# Patient Record
Sex: Female | Born: 1945 | Race: Black or African American | Hispanic: No | Marital: Single | Smoking: Former smoker
Health system: Southern US, Community
[De-identification: ages and names within clinical notes are randomized; demographics above are authoritative.]

## PROBLEM LIST (undated history)

## (undated) DIAGNOSIS — K219 Gastro-esophageal reflux disease without esophagitis: Secondary | ICD-10-CM

## (undated) DIAGNOSIS — J189 Pneumonia, unspecified organism: Secondary | ICD-10-CM

## (undated) DIAGNOSIS — I771 Stricture of artery: Secondary | ICD-10-CM

## (undated) DIAGNOSIS — R51 Headache: Secondary | ICD-10-CM

## (undated) DIAGNOSIS — F17200 Nicotine dependence, unspecified, uncomplicated: Secondary | ICD-10-CM

## (undated) DIAGNOSIS — I10 Essential (primary) hypertension: Secondary | ICD-10-CM

## (undated) DIAGNOSIS — R918 Other nonspecific abnormal finding of lung field: Secondary | ICD-10-CM

## (undated) DIAGNOSIS — H269 Unspecified cataract: Secondary | ICD-10-CM

## (undated) DIAGNOSIS — J449 Chronic obstructive pulmonary disease, unspecified: Secondary | ICD-10-CM

## (undated) DIAGNOSIS — E2839 Other primary ovarian failure: Secondary | ICD-10-CM

## (undated) DIAGNOSIS — E785 Hyperlipidemia, unspecified: Secondary | ICD-10-CM

## (undated) DIAGNOSIS — Z9114 Patient's other noncompliance with medication regimen: Secondary | ICD-10-CM

## (undated) DIAGNOSIS — F810 Specific reading disorder: Secondary | ICD-10-CM

## (undated) DIAGNOSIS — M810 Age-related osteoporosis without current pathological fracture: Secondary | ICD-10-CM

## (undated) DIAGNOSIS — E119 Type 2 diabetes mellitus without complications: Secondary | ICD-10-CM

## (undated) DIAGNOSIS — H5015 Alternating exotropia: Secondary | ICD-10-CM

## (undated) DIAGNOSIS — R079 Chest pain, unspecified: Secondary | ICD-10-CM

## (undated) DIAGNOSIS — M1310 Monoarthritis, not elsewhere classified, unspecified site: Secondary | ICD-10-CM

## (undated) DIAGNOSIS — IMO0002 Reserved for concepts with insufficient information to code with codable children: Secondary | ICD-10-CM

## (undated) HISTORY — DX: Stricture of artery: I77.1

## (undated) HISTORY — DX: Other primary ovarian failure: E28.39

## (undated) HISTORY — DX: Headache: R51

## (undated) HISTORY — DX: Specific reading disorder: F81.0

## (undated) HISTORY — DX: Hyperlipidemia, unspecified: E78.5

## (undated) HISTORY — DX: Alternating exotropia: H50.15

## (undated) HISTORY — DX: Other nonspecific abnormal finding of lung field: R91.8

## (undated) HISTORY — DX: Reserved for concepts with insufficient information to code with codable children: IMO0002

## (undated) HISTORY — DX: Unspecified cataract: H26.9

## (undated) HISTORY — DX: Age-related osteoporosis without current pathological fracture: M81.0

## (undated) HISTORY — DX: Chest pain, unspecified: R07.9

## (undated) HISTORY — DX: Patient's other noncompliance with medication regimen: Z91.14

## (undated) HISTORY — DX: Nicotine dependence, unspecified, uncomplicated: F17.200

## (undated) HISTORY — DX: Gastro-esophageal reflux disease without esophagitis: K21.9

---

## 1973-04-12 HISTORY — PX: CHOLECYSTECTOMY: SHX55

## 1973-04-12 HISTORY — PX: TUBAL LIGATION: SHX77

## 1997-09-12 ENCOUNTER — Encounter: Admission: RE | Admit: 1997-09-12 | Discharge: 1997-09-12 | Payer: Self-pay | Admitting: Family Medicine

## 1997-10-01 ENCOUNTER — Encounter: Admission: RE | Admit: 1997-10-01 | Discharge: 1997-10-01 | Payer: Self-pay | Admitting: Family Medicine

## 1998-03-25 ENCOUNTER — Encounter: Admission: RE | Admit: 1998-03-25 | Discharge: 1998-03-25 | Payer: Self-pay | Admitting: Family Medicine

## 1998-10-13 ENCOUNTER — Encounter: Admission: RE | Admit: 1998-10-13 | Discharge: 1998-10-13 | Payer: Self-pay | Admitting: Family Medicine

## 1998-12-23 ENCOUNTER — Encounter: Admission: RE | Admit: 1998-12-23 | Discharge: 1998-12-23 | Payer: Self-pay | Admitting: Family Medicine

## 1999-06-12 ENCOUNTER — Encounter: Admission: RE | Admit: 1999-06-12 | Discharge: 1999-06-12 | Payer: Self-pay | Admitting: Family Medicine

## 2000-01-12 ENCOUNTER — Encounter: Admission: RE | Admit: 2000-01-12 | Discharge: 2000-01-12 | Payer: Self-pay | Admitting: Family Medicine

## 2000-01-25 ENCOUNTER — Encounter: Admission: RE | Admit: 2000-01-25 | Discharge: 2000-04-24 | Payer: Self-pay | Admitting: Family Medicine

## 2000-01-29 ENCOUNTER — Encounter: Admission: RE | Admit: 2000-01-29 | Discharge: 2000-01-29 | Payer: Self-pay | Admitting: Family Medicine

## 2000-01-29 ENCOUNTER — Encounter: Payer: Self-pay | Admitting: Family Medicine

## 2000-02-16 ENCOUNTER — Encounter: Admission: RE | Admit: 2000-02-16 | Discharge: 2000-02-16 | Payer: Self-pay | Admitting: Family Medicine

## 2000-03-12 ENCOUNTER — Encounter (INDEPENDENT_AMBULATORY_CARE_PROVIDER_SITE_OTHER): Payer: Self-pay | Admitting: *Deleted

## 2000-03-12 LAB — CONVERTED CEMR LAB

## 2000-03-22 ENCOUNTER — Encounter: Admission: RE | Admit: 2000-03-22 | Discharge: 2000-03-22 | Payer: Self-pay | Admitting: Family Medicine

## 2000-04-25 ENCOUNTER — Encounter: Admission: RE | Admit: 2000-04-25 | Discharge: 2000-07-24 | Payer: Self-pay | Admitting: Family Medicine

## 2000-08-30 ENCOUNTER — Encounter: Admission: RE | Admit: 2000-08-30 | Discharge: 2000-08-30 | Payer: Self-pay | Admitting: Family Medicine

## 2000-09-01 ENCOUNTER — Encounter: Admission: RE | Admit: 2000-09-01 | Discharge: 2000-11-30 | Payer: Self-pay | Admitting: Family Medicine

## 2000-11-02 ENCOUNTER — Emergency Department (HOSPITAL_COMMUNITY): Admission: EM | Admit: 2000-11-02 | Discharge: 2000-11-02 | Payer: Self-pay | Admitting: Emergency Medicine

## 2000-11-02 ENCOUNTER — Encounter: Payer: Self-pay | Admitting: Emergency Medicine

## 2000-11-22 ENCOUNTER — Encounter: Admission: RE | Admit: 2000-11-22 | Discharge: 2000-11-22 | Payer: Self-pay | Admitting: Family Medicine

## 2000-11-30 ENCOUNTER — Encounter: Admission: RE | Admit: 2000-11-30 | Discharge: 2000-11-30 | Payer: Self-pay | Admitting: Family Medicine

## 2000-12-04 ENCOUNTER — Emergency Department (HOSPITAL_COMMUNITY): Admission: EM | Admit: 2000-12-04 | Discharge: 2000-12-04 | Payer: Self-pay | Admitting: Emergency Medicine

## 2000-12-04 ENCOUNTER — Encounter: Payer: Self-pay | Admitting: Emergency Medicine

## 2000-12-08 ENCOUNTER — Encounter: Admission: RE | Admit: 2000-12-08 | Discharge: 2001-03-08 | Payer: Self-pay | Admitting: Family Medicine

## 2000-12-27 ENCOUNTER — Encounter: Admission: RE | Admit: 2000-12-27 | Discharge: 2000-12-27 | Payer: Self-pay | Admitting: Family Medicine

## 2001-04-18 ENCOUNTER — Encounter: Admission: RE | Admit: 2001-04-18 | Discharge: 2001-04-18 | Payer: Self-pay | Admitting: Family Medicine

## 2001-04-26 ENCOUNTER — Encounter: Payer: Self-pay | Admitting: Family Medicine

## 2001-04-26 ENCOUNTER — Encounter: Admission: RE | Admit: 2001-04-26 | Discharge: 2001-04-26 | Payer: Self-pay | Admitting: Family Medicine

## 2001-06-07 ENCOUNTER — Encounter: Admission: RE | Admit: 2001-06-07 | Discharge: 2001-09-05 | Payer: Self-pay | Admitting: Family Medicine

## 2001-09-19 ENCOUNTER — Encounter: Admission: RE | Admit: 2001-09-19 | Discharge: 2001-09-19 | Payer: Self-pay | Admitting: Family Medicine

## 2001-12-05 ENCOUNTER — Encounter: Admission: RE | Admit: 2001-12-05 | Discharge: 2002-03-05 | Payer: Self-pay | Admitting: Family Medicine

## 2002-03-07 ENCOUNTER — Encounter: Admission: RE | Admit: 2002-03-07 | Discharge: 2002-06-05 | Payer: Self-pay | Admitting: Family Medicine

## 2002-05-15 ENCOUNTER — Encounter: Admission: RE | Admit: 2002-05-15 | Discharge: 2002-05-15 | Payer: Self-pay | Admitting: Family Medicine

## 2003-02-19 ENCOUNTER — Encounter: Admission: RE | Admit: 2003-02-19 | Discharge: 2003-02-19 | Payer: Self-pay | Admitting: Family Medicine

## 2003-05-23 ENCOUNTER — Encounter: Admission: RE | Admit: 2003-05-23 | Discharge: 2003-05-23 | Payer: Self-pay | Admitting: Sports Medicine

## 2003-05-29 ENCOUNTER — Encounter: Admission: RE | Admit: 2003-05-29 | Discharge: 2003-05-29 | Payer: Self-pay | Admitting: Family Medicine

## 2003-05-30 ENCOUNTER — Encounter: Admission: RE | Admit: 2003-05-30 | Discharge: 2003-05-30 | Payer: Self-pay | Admitting: Family Medicine

## 2004-01-21 ENCOUNTER — Ambulatory Visit: Payer: Self-pay | Admitting: Family Medicine

## 2004-02-04 ENCOUNTER — Ambulatory Visit: Payer: Self-pay | Admitting: Family Medicine

## 2004-02-04 DIAGNOSIS — I771 Stricture of artery: Secondary | ICD-10-CM

## 2004-02-04 HISTORY — DX: Stricture of artery: I77.1

## 2004-02-11 ENCOUNTER — Ambulatory Visit: Payer: Self-pay | Admitting: Family Medicine

## 2004-02-17 ENCOUNTER — Ambulatory Visit: Payer: Self-pay | Admitting: Sports Medicine

## 2004-02-25 ENCOUNTER — Ambulatory Visit: Payer: Self-pay | Admitting: Family Medicine

## 2004-03-17 ENCOUNTER — Ambulatory Visit: Payer: Self-pay | Admitting: Sports Medicine

## 2004-03-31 ENCOUNTER — Ambulatory Visit: Payer: Self-pay | Admitting: Family Medicine

## 2004-04-29 ENCOUNTER — Ambulatory Visit (HOSPITAL_COMMUNITY): Admission: RE | Admit: 2004-04-29 | Discharge: 2004-04-29 | Payer: Self-pay | Admitting: Gastroenterology

## 2004-05-19 ENCOUNTER — Ambulatory Visit: Payer: Self-pay | Admitting: Family Medicine

## 2004-06-01 ENCOUNTER — Encounter: Admission: RE | Admit: 2004-06-01 | Discharge: 2004-06-01 | Payer: Self-pay | Admitting: Family Medicine

## 2004-10-06 ENCOUNTER — Ambulatory Visit: Payer: Self-pay | Admitting: Family Medicine

## 2005-05-04 ENCOUNTER — Ambulatory Visit: Payer: Self-pay | Admitting: Family Medicine

## 2005-10-03 ENCOUNTER — Emergency Department (HOSPITAL_COMMUNITY): Admission: EM | Admit: 2005-10-03 | Discharge: 2005-10-03 | Payer: Self-pay | Admitting: Emergency Medicine

## 2005-10-10 DIAGNOSIS — IMO0002 Reserved for concepts with insufficient information to code with codable children: Secondary | ICD-10-CM

## 2005-10-10 HISTORY — DX: Reserved for concepts with insufficient information to code with codable children: IMO0002

## 2005-10-19 ENCOUNTER — Ambulatory Visit: Payer: Self-pay | Admitting: Sports Medicine

## 2005-11-09 ENCOUNTER — Ambulatory Visit: Payer: Self-pay | Admitting: Family Medicine

## 2006-03-28 ENCOUNTER — Ambulatory Visit: Payer: Self-pay | Admitting: Family Medicine

## 2006-04-19 ENCOUNTER — Ambulatory Visit: Payer: Self-pay | Admitting: Family Medicine

## 2006-05-10 ENCOUNTER — Encounter: Admission: RE | Admit: 2006-05-10 | Discharge: 2006-05-10 | Payer: Self-pay | Admitting: Family Medicine

## 2006-05-24 ENCOUNTER — Encounter: Admission: RE | Admit: 2006-05-24 | Discharge: 2006-05-24 | Payer: Self-pay | Admitting: Family Medicine

## 2006-06-06 LAB — CONVERTED CEMR LAB
Albumin: 4.4 g/dL (ref 3.5–5.2)
Alkaline Phosphatase: 78 units/L (ref 39–117)
BUN: 15 mg/dL (ref 6–23)
CO2: 30 meq/L (ref 19–32)
Glucose, Bld: 100 mg/dL — ABNORMAL HIGH (ref 70–99)
Total Bilirubin: 0.5 mg/dL (ref 0.3–1.2)
Total Protein: 7.6 g/dL (ref 6.0–8.3)

## 2006-06-09 DIAGNOSIS — M5430 Sciatica, unspecified side: Secondary | ICD-10-CM

## 2006-06-09 DIAGNOSIS — K219 Gastro-esophageal reflux disease without esophagitis: Secondary | ICD-10-CM | POA: Insufficient documentation

## 2006-06-09 DIAGNOSIS — I10 Essential (primary) hypertension: Secondary | ICD-10-CM

## 2006-06-09 DIAGNOSIS — E119 Type 2 diabetes mellitus without complications: Secondary | ICD-10-CM

## 2006-06-09 DIAGNOSIS — H269 Unspecified cataract: Secondary | ICD-10-CM

## 2006-06-09 DIAGNOSIS — E785 Hyperlipidemia, unspecified: Secondary | ICD-10-CM | POA: Insufficient documentation

## 2006-06-10 ENCOUNTER — Encounter (INDEPENDENT_AMBULATORY_CARE_PROVIDER_SITE_OTHER): Payer: Self-pay | Admitting: *Deleted

## 2007-02-21 ENCOUNTER — Ambulatory Visit: Payer: Self-pay | Admitting: Family Medicine

## 2007-02-21 ENCOUNTER — Encounter: Payer: Self-pay | Admitting: Family Medicine

## 2007-02-21 LAB — CONVERTED CEMR LAB
BUN: 12 mg/dL (ref 6–23)
Chloride: 105 meq/L (ref 96–112)
Cholesterol, target level: 200 mg/dL
Creatinine, Ser: 0.74 mg/dL (ref 0.40–1.20)
Glucose, Bld: 108 mg/dL — ABNORMAL HIGH (ref 70–99)
HDL goal, serum: 40 mg/dL
Hgb A1c MFr Bld: 6 %
Pap Smear: NORMAL
Potassium: 3.8 meq/L (ref 3.5–5.3)

## 2007-02-28 ENCOUNTER — Encounter: Payer: Self-pay | Admitting: Family Medicine

## 2007-03-21 ENCOUNTER — Encounter: Payer: Self-pay | Admitting: Family Medicine

## 2007-05-17 ENCOUNTER — Encounter: Admission: RE | Admit: 2007-05-17 | Discharge: 2007-05-17 | Payer: Self-pay | Admitting: Family Medicine

## 2007-05-19 ENCOUNTER — Encounter: Payer: Self-pay | Admitting: Family Medicine

## 2007-05-31 ENCOUNTER — Encounter: Payer: Self-pay | Admitting: Family Medicine

## 2007-06-26 ENCOUNTER — Encounter: Payer: Self-pay | Admitting: Family Medicine

## 2007-06-27 ENCOUNTER — Encounter: Payer: Self-pay | Admitting: Family Medicine

## 2007-06-28 ENCOUNTER — Encounter: Payer: Self-pay | Admitting: Family Medicine

## 2007-07-11 ENCOUNTER — Ambulatory Visit: Payer: Self-pay | Admitting: Family Medicine

## 2007-07-11 ENCOUNTER — Encounter: Payer: Self-pay | Admitting: Family Medicine

## 2007-07-11 LAB — CONVERTED CEMR LAB
AST: 17 units/L (ref 0–37)
Albumin: 4.6 g/dL (ref 3.5–5.2)
Alkaline Phosphatase: 65 units/L (ref 39–117)
BUN: 14 mg/dL (ref 6–23)
Creatinine, Ser: 0.82 mg/dL (ref 0.40–1.20)
Glucose, Bld: 95 mg/dL (ref 70–99)
HDL: 58 mg/dL (ref >39)
Hemoglobin: 12.9 g/dL (ref 12.0–15.0)
Hgb A1c MFr Bld: 5.5 %
LDL Cholesterol: 105 mg/dL — ABNORMAL HIGH (ref 0–99)
MCHC: 33.2 g/dL (ref 30.0–36.0)
MCV: 88.2 fL (ref 78.0–100.0)
RBC: 4.4 M/uL (ref 3.87–5.11)
RDW: 13.7 % (ref 11.5–15.5)
Total Bilirubin: 1 mg/dL (ref 0.3–1.2)
Total CHOL/HDL Ratio: 3.1
Triglycerides: 74 mg/dL (ref <150)
VLDL: 15 mg/dL (ref 0–40)

## 2007-07-18 ENCOUNTER — Ambulatory Visit: Payer: Self-pay | Admitting: Family Medicine

## 2007-07-18 DIAGNOSIS — F810 Specific reading disorder: Secondary | ICD-10-CM | POA: Insufficient documentation

## 2007-07-18 DIAGNOSIS — H5015 Alternating exotropia: Secondary | ICD-10-CM

## 2007-07-18 HISTORY — DX: Specific reading disorder: F81.0

## 2007-07-18 LAB — CONVERTED CEMR LAB

## 2007-08-15 ENCOUNTER — Encounter: Payer: Self-pay | Admitting: Family Medicine

## 2007-08-15 DIAGNOSIS — F4322 Adjustment disorder with anxiety: Secondary | ICD-10-CM

## 2007-08-16 ENCOUNTER — Encounter: Payer: Self-pay | Admitting: Family Medicine

## 2007-09-07 ENCOUNTER — Encounter: Payer: Self-pay | Admitting: Family Medicine

## 2007-09-11 ENCOUNTER — Telehealth: Payer: Self-pay | Admitting: *Deleted

## 2007-09-11 ENCOUNTER — Telehealth: Payer: Self-pay | Admitting: Family Medicine

## 2007-09-28 ENCOUNTER — Ambulatory Visit: Payer: Self-pay | Admitting: Family Medicine

## 2007-10-16 ENCOUNTER — Ambulatory Visit: Payer: Self-pay | Admitting: Family Medicine

## 2007-10-16 ENCOUNTER — Telehealth: Payer: Self-pay | Admitting: *Deleted

## 2007-12-19 ENCOUNTER — Encounter: Payer: Self-pay | Admitting: Family Medicine

## 2007-12-22 ENCOUNTER — Telehealth: Payer: Self-pay | Admitting: Family Medicine

## 2008-02-08 ENCOUNTER — Encounter: Payer: Self-pay | Admitting: Family Medicine

## 2008-02-13 ENCOUNTER — Ambulatory Visit: Payer: Self-pay | Admitting: Family Medicine

## 2008-02-13 DIAGNOSIS — N39498 Other specified urinary incontinence: Secondary | ICD-10-CM

## 2008-02-13 LAB — CONVERTED CEMR LAB
BUN: 12 mg/dL (ref 6–23)
Bilirubin Urine: NEGATIVE
CO2: 29 meq/L (ref 19–32)
Calcium: 9.9 mg/dL (ref 8.4–10.5)
Chloride: 97 meq/L (ref 96–112)
Creatinine, Ser: 0.96 mg/dL (ref 0.40–1.20)
Direct LDL: 136 mg/dL — ABNORMAL HIGH
Glucose, Bld: 85 mg/dL (ref 70–99)
Ketones, urine, test strip: NEGATIVE
Potassium: 3.8 meq/L (ref 3.5–5.3)
Sodium: 138 meq/L (ref 135–145)
Urobilinogen, UA: 1

## 2008-02-14 ENCOUNTER — Encounter: Payer: Self-pay | Admitting: Family Medicine

## 2008-06-10 ENCOUNTER — Encounter: Payer: Self-pay | Admitting: Family Medicine

## 2008-08-08 ENCOUNTER — Telehealth: Payer: Self-pay | Admitting: Family Medicine

## 2008-08-08 ENCOUNTER — Encounter: Payer: Self-pay | Admitting: Family Medicine

## 2008-08-08 ENCOUNTER — Ambulatory Visit: Payer: Self-pay | Admitting: Family Medicine

## 2008-08-08 LAB — CONVERTED CEMR LAB
ALT: 12 units/L (ref 0–35)
AST: 19 units/L (ref 0–37)
Creatinine, Ser: 0.77 mg/dL (ref 0.40–1.20)
HCT: 41.2 % (ref 36.0–46.0)
Hgb A1c MFr Bld: 5.7 %
MCV: 86.6 fL (ref 78.0–100.0)
Platelets: 425 10*3/uL — ABNORMAL HIGH (ref 150–400)
RDW: 12.9 % (ref 11.5–15.5)
Total Bilirubin: 0.8 mg/dL (ref 0.3–1.2)

## 2008-08-11 ENCOUNTER — Encounter: Payer: Self-pay | Admitting: Family Medicine

## 2008-08-21 ENCOUNTER — Telehealth: Payer: Self-pay | Admitting: *Deleted

## 2008-12-18 ENCOUNTER — Ambulatory Visit: Payer: Self-pay | Admitting: Family Medicine

## 2009-01-28 ENCOUNTER — Ambulatory Visit: Payer: Self-pay | Admitting: Family Medicine

## 2009-01-28 LAB — CONVERTED CEMR LAB
BUN: 15 mg/dL (ref 6–23)
CO2: 25 meq/L (ref 19–32)
Calcium: 10.1 mg/dL (ref 8.4–10.5)
Chloride: 98 meq/L (ref 96–112)
Creatinine, Ser: 0.87 mg/dL (ref 0.40–1.20)
Direct LDL: 131 mg/dL — ABNORMAL HIGH
Glucose, Bld: 107 mg/dL — ABNORMAL HIGH (ref 70–99)
Uric Acid, Serum: 6.3 mg/dL (ref 2.4–7.0)
Vit D, 25-Hydroxy: 12 ng/mL — ABNORMAL LOW (ref 30–89)

## 2009-01-31 ENCOUNTER — Telehealth: Payer: Self-pay | Admitting: Family Medicine

## 2009-02-19 ENCOUNTER — Encounter: Payer: Self-pay | Admitting: Family Medicine

## 2009-02-25 ENCOUNTER — Encounter: Admission: RE | Admit: 2009-02-25 | Discharge: 2009-02-25 | Payer: Self-pay | Admitting: Family Medicine

## 2009-03-04 ENCOUNTER — Encounter (INDEPENDENT_AMBULATORY_CARE_PROVIDER_SITE_OTHER): Payer: Self-pay | Admitting: *Deleted

## 2009-03-04 DIAGNOSIS — F17200 Nicotine dependence, unspecified, uncomplicated: Secondary | ICD-10-CM

## 2009-03-04 HISTORY — DX: Nicotine dependence, unspecified, uncomplicated: F17.200

## 2009-03-10 ENCOUNTER — Encounter: Payer: Self-pay | Admitting: Family Medicine

## 2009-03-21 ENCOUNTER — Encounter: Payer: Self-pay | Admitting: Family Medicine

## 2009-04-16 ENCOUNTER — Ambulatory Visit: Payer: Self-pay | Admitting: Family Medicine

## 2009-04-16 ENCOUNTER — Telehealth: Payer: Self-pay | Admitting: Family Medicine

## 2009-04-16 ENCOUNTER — Ambulatory Visit (HOSPITAL_COMMUNITY): Admission: RE | Admit: 2009-04-16 | Discharge: 2009-04-16 | Payer: Self-pay | Admitting: Family Medicine

## 2009-06-02 ENCOUNTER — Telehealth: Payer: Self-pay | Admitting: Family Medicine

## 2009-06-03 ENCOUNTER — Ambulatory Visit: Payer: Self-pay | Admitting: Family Medicine

## 2009-06-03 ENCOUNTER — Encounter: Admission: RE | Admit: 2009-06-03 | Discharge: 2009-06-03 | Payer: Self-pay | Admitting: Family Medicine

## 2009-06-03 LAB — CONVERTED CEMR LAB: Hgb A1c MFr Bld: 5.9 %

## 2009-06-24 ENCOUNTER — Encounter: Payer: Self-pay | Admitting: Family Medicine

## 2009-08-28 ENCOUNTER — Telehealth: Payer: Self-pay | Admitting: Family Medicine

## 2009-12-01 ENCOUNTER — Encounter: Payer: Self-pay | Admitting: Family Medicine

## 2010-01-13 ENCOUNTER — Ambulatory Visit: Payer: Self-pay | Admitting: Family Medicine

## 2010-01-13 DIAGNOSIS — M2010 Hallux valgus (acquired), unspecified foot: Secondary | ICD-10-CM | POA: Insufficient documentation

## 2010-01-13 DIAGNOSIS — R252 Cramp and spasm: Secondary | ICD-10-CM | POA: Insufficient documentation

## 2010-01-14 ENCOUNTER — Telehealth: Payer: Self-pay | Admitting: Family Medicine

## 2010-01-29 ENCOUNTER — Ambulatory Visit: Payer: Self-pay | Admitting: Family Medicine

## 2010-02-09 ENCOUNTER — Encounter: Payer: Self-pay | Admitting: Family Medicine

## 2010-02-09 LAB — CONVERTED CEMR LAB
AST: 18 units/L (ref 0–37)
BUN: 12 mg/dL (ref 6–23)
Calcium: 9.8 mg/dL (ref 8.4–10.5)
Chloride: 97 meq/L (ref 96–112)
Creatinine, Ser: 0.82 mg/dL (ref 0.40–1.20)
HCT: 42.2 % (ref 36.0–46.0)
HDL: 66 mg/dL (ref >39)
Hemoglobin: 14.2 g/dL (ref 12.0–15.0)
LDL Cholesterol: 125 mg/dL — ABNORMAL HIGH (ref 0–99)
RDW: 13 % (ref 11.5–15.5)
Total CHOL/HDL Ratio: 3.3

## 2010-02-10 ENCOUNTER — Encounter: Payer: Self-pay | Admitting: Pharmacist

## 2010-03-16 ENCOUNTER — Telehealth: Payer: Self-pay | Admitting: Family Medicine

## 2010-05-03 ENCOUNTER — Encounter: Payer: Self-pay | Admitting: Family Medicine

## 2010-05-05 ENCOUNTER — Other Ambulatory Visit: Payer: Self-pay | Admitting: Family Medicine

## 2010-05-05 DIAGNOSIS — Z1231 Encounter for screening mammogram for malignant neoplasm of breast: Secondary | ICD-10-CM

## 2010-05-05 DIAGNOSIS — Z1239 Encounter for other screening for malignant neoplasm of breast: Secondary | ICD-10-CM

## 2010-05-14 NOTE — Progress Notes (Signed)
Summary: Rx Req  Phone Note Call from Patient Call back at [phone]   Caller: Patient Summary of Call: Would like a rx for diabetic shoes.   Initial call taken by: Clydell Hakim,  Aug 28, 2009 1:35 PM  Follow-up for Phone Call        She is due for a follow-up diabetes visit. I can see if her feet qualify at that time. Follow-up by: Zachery Dauer MD,  Aug 28, 2009 6:00 PM     Appended Document: Rx Req Pt notified of doctor's comments pt made appt to be seen.

## 2010-05-14 NOTE — Miscellaneous (Signed)
Summary: Reject refill metformin, last DM visit 01/2009  fax request returned "Have patient contact my office" was checked off

## 2010-05-14 NOTE — Miscellaneous (Signed)
Summary: Orders Update  Clinical Lists Changes  Problems: Added new problem of ENCOUNTER FOR LONG-TERM USE OF OTHER MEDICATIONS (ICD-V58.69) Orders: Added new Test order of B12-[facility] (82607-23330) - Signed Added new Test order of CBC-[facility] (85027) - Signed OK per Dr. Hale 

## 2010-05-14 NOTE — Assessment & Plan Note (Signed)
Summary: f/up,tcb   Vital Signs:  Patient profile:   65 year old female Height:      60 inches Weight:      177.1 pounds BMI:     34.71 Pulse rate:   80 / minute BP sitting:   127 / 77  (right arm)  Vitals Entered By: Arlyss Repress CMA, (January 13, 2010 3:53 PM) CC: f/up DM. flu shot given. fill out form for diabetic shoes. Is Patient Diabetic? Yes Pain Assessment Patient in pain? no        Primary Care Evertte Sones:  Zachery Dauer MD  CC:  f/up DM. flu shot given. fill out form for diabetic shoes..  History of Present Illness: Her only reason for not coming in for follow-up is that she doesn't like going to the doctor. She has [employer] Medicare Complete  through disability and has low copays for medications, but still struggles with this. Doesn't have test-strips for her One-touch Ultra machine. Hasn't had symptoms of hypoglycemia.   Gets cramps in her legs and takes an over the counter cinchona medication for quinine effect. ? if helps.  Takes Prilosec or over the counter H2 blocker to control gerd.    Walking 30 minutes inside most days.     Habits & Providers  Alcohol-Tobacco-Diet     Tobacco Status: current     Tobacco Counseling: to quit use of tobacco products  Comments: pt states 'i only smoke sometimes'  Current Medications (verified): 1)  Aspirin Ec 81 Mg Tbec (Aspirin) .[name].. 1 Tablet By Mouth Once A Day 2)  Metformin Hcl 1000 Mg  Tabs (Metformin Hcl) .... Ake 1/2 Tab in A.m. and 1 Tab in P.m. 3)  Ranitidine Hcl 150 Mg Caps (Ranitidine Hcl) .... Take 1 Capsule By Mouth Twice A Day 4)  Hydrochlorothiazide 25 Mg  Tabs (Hydrochlorothiazide) .... Take One Tablet Daily 5)  Enalapril Maleate 10 Mg  Tabs (Enalapril Maleate) .... Take 1 Tab By Mouth Daily 6)  Proair Hfa 108 (90 Base) Mcg/act Aers (Albuterol Sulfate) .... Take 2 Puffs Every 4 Hour Pain 7)  Onetouch Test  Strp (Glucose Blood) .... Test Blood Fasting Daily  Allergies (verified): No Known Drug  Allergies  Physical Exam  General:  In no acute distress; alert,appropriate and cooperative throughout examination Lungs:  Normal respiratory effort, chest expands symmetrically. Lungs are clear to auscultation except dimished in right, no crackles or wheezes. Heart:  Normal rate and regular rhythm. S1 and S2 normal without gallop, murmur, click, rub or other extra sounds. Extremities:  No edema  Diabetes Management Exam:    Foot Exam (with socks and/or shoes not present):       Sensory-Pinprick/Light touch:          Left medial foot (L-4): normal          Left dorsal foot (L-5): normal          Left lateral foot (S-1): normal          Right medial foot (L-4): normal          Right dorsal foot (L-5): normal          Right lateral foot (S-1): normal       Sensory-Monofilament:          Left foot: normal          Right foot: normal       Inspection:          Left foot: abnormal  Comments: sl callous under bunion          Right foot: abnormal             Comments: callous under bunion       Nails:          Left foot: normal          Right foot: normal   Foot/Ankle Exam  Inspection:    bilateral hallux valgus  Vascular:    dorsalis pedis and posterior tibial pulses 2+ and symmetric, capillary refill < 2 seconds, normal hair pattern, no evidence of ischemia.    Impression & Recommendations:  Problem # 1:  DIABETES MELLITUS II, UNCOMPLICATED (ICD-250.00) Assessment Improved She agrees to come in no less often than every 6 months.  Her updated medication list for this problem includes:    Aspirin Ec 81 Mg Tbec (Aspirin) .[name]... 1 tablet by mouth once a day    Metformin Hcl 1000 Mg Tabs (Metformin hcl) .[name]... Ake 1/2 tab in a.m. and 1 tab in p.m.    Enalapril Maleate 10 Mg Tabs (Enalapril maleate) .[name]... Take 1 tab by mouth daily  Orders: A1C-[facility] (40981) [facility]- Est  Level 4 (99214)Future Orders: Comp Met-[facility] ([id number]) ... 01/11/2011 Lipid-[facility] ([id number]) ...  01/11/2011  Problem # 2:  HYPERTENSION, BENIGN SYSTEMIC (ICD-401.1) Assessment: Improved  Her updated medication list for this problem includes:    Hydrochlorothiazide 25 Mg Tabs (Hydrochlorothiazide) .[name]... Take one tablet daily    Enalapril Maleate 10 Mg Tabs (Enalapril maleate) .[name]... Take 1 tab by mouth daily  Orders: [facility]- Est  Level 4 (99214)Future Orders: Comp Met-[facility] ([id number]) ... 01/11/2011  Problem # 3:  HALLUX VALGUS (ICD-735.0) Form for diabetic shoes completed Orders: [facility]- Est  Level 4 (32440)  Problem # 4:  OBESITY, NOS (ICD-278.00) Assessment: Improved congratulated on weight loss Orders: [facility]- Est  Level 4 (10272)  Problem # 5:  LEG CRAMPS (ICD-729.82)  Future Orders: Comp Met-[facility] ([id number]) ... 01/11/2011 CBC-[facility] (42595) ... 01/11/2011  Complete Medication List: 1)  Aspirin Ec 81 Mg Tbec (Aspirin) .[name].. 1 tablet by mouth once a day 2)  Metformin Hcl 1000 Mg Tabs (Metformin hcl) .... Ake 1/2 tab in a.m. and 1 tab in p.m. 3)  Ranitidine Hcl 150 Mg Caps (Ranitidine hcl) .... Take 1 capsule by mouth twice a day 4)  Hydrochlorothiazide 25 Mg Tabs (Hydrochlorothiazide) .... Take one tablet daily 5)  Enalapril Maleate 10 Mg Tabs (Enalapril maleate) .... Take 1 tab by mouth daily 6)  Proair Hfa 108 (90 Base) Mcg/act Aers (Albuterol sulfate) .... Take 2 puffs every 4 hour pain 7)  Onetouch Test Strp (Glucose blood) .... Test blood fasting daily  Other Orders: Influenza Vaccine NON MCR (63875)  Patient Instructions: 1)  Please schedule a follow-up appointment in 6 months .  2)  Return in 2 weeks for fasting blood tests  3)  Congratulations on your weight loss and exercise program.  Prescriptions: METFORMIN HCL 1000 MG  TABS (METFORMIN HCL) ake 1/2 tab in A.M. and 1 tab in P.M.  #45 x 5   Entered and Authorized by:   Zachery Dauer MD   Signed by:   Zachery Dauer MD on 01/13/2010   Method used:   Printed then faxed to ...       [facility] (retail)       [address]       [address]       [place], [place]  [address]  Ph: [phone]       Fax: [phone]   RxID:   [id number]    Influenza Vaccine    Vaccine Type: Fluvax Non-MCR    Site: left deltoid    Mfr: GlaxoSmithKline    Dose: 0.5 ml    Route: IM    Given by: Arlyss Repress CMA,    Exp. Date: 10/07/2010    Lot #: [id number]    VIS given: 11/04/09 version given January 13, 2010.  Flu Vaccine Consent Questions    Do you have a history of severe allergic reactions to this vaccine? no    Any prior history of allergic reactions to egg and/or gelatin? no    Do you have a sensitivity to the preservative Thimersol? no    Do you have a past history of Guillan-Barre Syndrome? no    Do you currently have an acute febrile illness? no    Have you ever had a severe reaction to latex? no    Vaccine information given and explained to patient? yes    Are you currently pregnant? no  Laboratory Results   Blood Tests   Date/Time Received: January 13, 2010 4:10 PM  Date/Time Reported: January 13, 2010 4:23 PM   HGBA1C: 5.8%   (Normal Range: Non-Diabetic - 3-6%   Control Diabetic - 6-8%)  Comments: ...............test performed by......Marland KitchenBonnie A. [place], MLS (ASCP)cm        Prevention & Chronic Care Immunizations   Influenza vaccine: Fluvax Non-MCR  (01/13/2010)   Influenza vaccine due: 02/21/2008    Tetanus booster: 04/12/2005: Done.   Tetanus booster due: 04/13/2015    Pneumococcal vaccine: Pneumovax  (07/18/2007)   Pneumococcal vaccine due: None    H. zoster vaccine: Not documented  Colorectal Screening   Hemoccult: Done.  (02/11/2004)   Hemoccult due: Not Indicated    Colonoscopy: Done.  (04/12/2004)   Colonoscopy due: 04/12/2014  Other Screening   Pap smear: normal  (02/21/2007)   Pap smear due: 02/21/2008    Mammogram: normal  (05/22/2007)   Mammogram due: 05/21/2008    DXA bone density scan: Not documented   Smoking status: current   (01/13/2010)   Smoking cessation counseling: YES  (06/03/2009)  Diabetes Mellitus   HgbA1C: 5.8  (01/13/2010)   Hemoglobin A1C due: 08/12/2008    Eye exam: No diabetic retinopathy.     (03/18/2009)   Eye exam due: 06/25/2008    Foot exam: yes  (01/13/2010)   High risk foot: Not documented   Foot care education: completed  (02/13/2008)   Foot exam due: 01/28/2010    Urine microalbumin/creatinine ratio: Not documented   Urine microalbumin/cr due: 07/17/2008    Diabetes flowsheet reviewed?: Yes   Progress toward A1C goal: At goal  Lipids   Total Cholesterol: 178  (07/11/2007)   LDL: 105  (07/11/2007)   LDL Direct: 131  (01/28/2009)   HDL: 58  (07/11/2007)   Triglycerides: 74  (07/11/2007)    SGOT (AST): 19  (01/28/2009)   SGPT (ALT): 11  (01/28/2009) CMP ordered    Alkaline phosphatase: 73  (01/28/2009)   Total bilirubin: 0.7  (01/28/2009)    Lipid flowsheet reviewed?: Yes   Progress toward LDL goal: Unchanged  Hypertension   Last Blood Pressure: 127 / 77  (01/13/2010)   Serum creatinine: 0.87  (01/28/2009)   Serum potassium 3.5  (01/28/2009) CMP ordered    Progress toward BP goal: At goal  Self-Management Support :   Personal Goals (by the next  clinic visit) :     Personal A1C goal: 7  (01/28/2009)     Personal blood pressure goal: 140/90  (01/28/2009)     Personal LDL goal: 100  (01/28/2009)    Patient will work on the following items until the next clinic visit to reach self-care goals:     Medications and monitoring: take my medicines every day  (01/13/2010)     Eating: drink diet soda or water instead of juice or soda, eat more vegetables  (01/13/2010)     Activity: take a 30 minute walk every day  (01/13/2010)    Diabetes self-management support: Not documented   Last diabetes self-management training by diabetes educator: completed    Hypertension self-management support: Not documented    Lipid self-management support: Not documented

## 2010-05-14 NOTE — Progress Notes (Signed)
Summary: triage  Phone Note Call from Patient Call back at Home Phone [phone]   Caller: Patient Summary of Call: Pt has had a cold since Friday and every time she coughs it hurts. Initial call taken by: Clydell Hakim,  April 16, 2009 8:39 AM  Follow-up for Phone Call        wants to be checked. work in at [employer]. aware of wait & that her pcp will not be seeing her Follow-up by: Golden Circle RN,  April 16, 2009 8:53 AM

## 2010-05-14 NOTE — Assessment & Plan Note (Signed)
 Summary: cough & back pain/red team   Vital Signs:  Patient profile:   65 year old female Height:      60 inches Weight:      176.6 pounds BMI:     34.61 Temp:     98.3 degrees F oral Pulse rate:   76 / minute BP sitting:   125 / 76  (right arm) Cuff size:   large  Vitals Entered By: Katie Mulberry LPN (August 08, 2008 11:10 AM)  CC: cough with back pain x 2 days Pain Assessment Patient in pain? yes     Location: back Intensity: 8   History of Present Illness:  5F with DM2, HTN, HLD, stress incontinence presents with COUGH: Onset: 2 days ago Description: Wet cough productive of sml amts clear sputum, associated with pain in ribs. Modifying factors:    Symptoms Productive: clear sputum Wheezing: no Dyspnea: no Nasal discharge: no, but associated with nasal stuffiness  Fever: no, subjective chills at home Sore throat: no Sick contacts: no Heartburn symptoms: no, already taking an H2 blocker   History of Asthma: no  Red Flags  Weight loss: no Hemoptysis: never Edema: no    Habits & Providers     Tobacco Status: current     Cigarette Packs/Day: <0.25  Allergies: No Known Drug Allergies  Social History:    Smoking Status:  current    Packs/Day:  <0.25  Review of Systems       See HPI  Physical Exam  General:  Well-developed,well-nourished,in no acute distress; alert,appropriate and cooperative throughout examination Head:  Normocephalic and atraumatic without obvious abnormalities.  Eyes:  No corneal or conjunctival inflammation noted. EOMI. Perrla. Ears:  External ear exam shows no significant lesions or deformities.  Otoscopic examination reveals clear canals, tympanic membranes are intact bilaterally without bulging, retraction, inflammation or discharge. Hearing is grossly normal bilaterally. Nose:  External nasal examination shows no deformity or inflammation. Nasal mucosa are pink and moistand somewhat boggy. Mouth:  Oral mucosa and oropharynx  without lesions or exudates. Neck:  No deformities, masses, or tenderness noted. Lungs:  Normal respiratory effort, chest expands symmetrically. Lungs are clear to auscultation, no crackles or wheezes. Heart:  Normal rate and regular rhythm. S1 and S2 normal without gallop, murmur, click, rub or other extra sounds. Abdomen:  Bowel sounds positive,abdomen soft and non-tender without masses, organomegaly or hernias noted.  Diabetes Management Exam:    Foot Exam (with socks and/or shoes not present):       Sensory-Pinprick/Light touch:          Left medial foot (L-4): normal          Left dorsal foot (L-5): normal          Left lateral foot (S-1): normal          Right medial foot (L-4): normal          Right dorsal foot (L-5): normal          Right lateral foot (S-1): normal       Sensory-Monofilament:          Left foot: normal          Right foot: normal       Inspection:          Left foot: normal          Right foot: normal       Nails:          Left foot: normal  Right foot: normal   Impression & Recommendations:  Problem # 1:  COUGH (ICD-786.2) Assessment New Likely 2/2 Viral URI.  Will give Tylenol  with codeine .  This will help her body aches and her cough.  Will also try Sudophed to help with stuffy nose, this will also help with her stress incontinence.  Pt can RTC if needed.  Orders: [facility]- Est Level  3 (00786)  Problem # 2:  DIABETES MELLITUS II, UNCOMPLICATED (ICD-250.00) Assessment: Unchanged Will check A1c today, CBC, CMET.  Excellent control in the past with A1c's between 5.5 and 6.0.  Her updated medication list for this problem includes:    Aspirin  Ec 81 Mg Tbec (Aspirin ) .SABRA... 1 tablet by mouth once a day    Metformin  Hcl 1000 Mg Tabs (Metformin  hcl) .SABRA... Ake 1/2 tab in a.m. and 1 tab in p.m.    Enalapril  Maleate 10 Mg Tabs (Enalapril  maleate) .SABRA... Take 1 tab by mouth daily  Orders: A1C-[facility] (16963) CBC-[facility] (14972) Comp Met-[facility]  ([id number])  Problem # 3:  HYPERTENSION, BENIGN SYSTEMIC (ICD-401.1) Assessment: Unchanged See #2.  Well controlled. Her updated medication list for this problem includes:    Hydrochlorothiazide  25 Mg Tabs (Hydrochlorothiazide ) .SABRA... Take one tablet daily    Enalapril  Maleate 10 Mg Tabs (Enalapril  maleate) .SABRA... Take 1 tab by mouth daily  Orders: CBC-[facility] (14972) Comp Met-[facility] ([id number])  Complete Medication List: 1)  Aspirin  Ec 81 Mg Tbec (Aspirin ) .SABRA.. 1 tablet by mouth once a day 2)  Metformin  Hcl 1000 Mg Tabs (Metformin  hcl) .... Ake 1/2 tab in a.m. and 1 tab in p.m. 3)  Ranitidine  Hcl 150 Mg Caps (Ranitidine  hcl) .... Take 1 capsule by mouth twice a day 4)  Simvastatin  40 Mg Tabs (Simvastatin ) .... Take 1 tablet by mouth at bedtime 5)  Hydrochlorothiazide  25 Mg Tabs (Hydrochlorothiazide ) .... Take one tablet daily 6)  Enalapril  Maleate 10 Mg Tabs (Enalapril  maleate) .... Take 1 tab by mouth daily 7)  Multivitamins Tabs (Multiple vitamin) .... Take one tablet daily 8)  Sudophed 30 Mg Tabs (Pseudoephedrine hcl) .... One tab by mouth q8h as needed for nasal stuffiness 9)  Tylenol  With Codeine  #3 300-30 Mg Tabs (Acetaminophen -codeine ) .... One tab by mouth q6h as needed for cough  Patient Instructions: 1)  Great to meet you today, it seems you have a viral upper respiratory infection, aka a cold. 2)  Take Tylenol  with codeine . It well help your body aches as well as your cough. 3)  Also take Sudophed.  It will help with your stuffy nose and help decrease your loss of urine when you cough.   4)  We will check some preventive bloodwork on you today. 5)  Come back to see us  as needed. 6)  -Dr. ONEIDA. Prescriptions: TYLENOL  WITH CODEINE  #3 300-30 MG TABS (ACETAMINOPHEN -CODEINE ) One tab by mouth q6h as needed for cough  #30 x 0   Entered and Authorized by:   Debby Petties MD   Signed by:   Debby Petties MD on 08/08/2008   Method used:   Print then Give to Patient   RxID:    [id number] SUDOPHED 30 MG TABS (PSEUDOEPHEDRINE HCL) One tab by mouth q8h as needed for nasal stuffiness  #30 x 0   Entered and Authorized by:   Debby Petties MD   Signed by:   Debby Petties MD on 08/08/2008   Method used:   Print then Give to Patient   RxID:   [id number]   Laboratory Results  Blood Tests   Date/Time Received: August 08, 2008 11:16 AM  Date/Time Reported: August 08, 2008 11:23 AM   HGBA1C: 5.7%   (Normal Range: Non-Diabetic - 3-6%   Control Diabetic - 6-8%)  Comments: ...........test performed by...........SABRAArland Morel, CMA     Appended Document: cough & back pain/red team I visited the patient who is very stressed helping he son who is being treated for colon cancer. She has to help him with his colostomy and he is very weak at times.   Clinical Lists Changes  Observations: Added new observation of FAMILY HX: Colon CA - uncle, son. 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

## 2010-05-14 NOTE — Progress Notes (Signed)
Summary: Rx Ranitidine  Phone Note Refill Request Call back at Home Phone [phone]   Refills Requested: Medication #1:  RANITIDINE HCL 150 MG CAPS Take 1 capsule by mouth twice a day has no refills left  Initial call taken by: Knox Royalty,  March 16, 2010 12:07 PM    Prescriptions: RANITIDINE HCL 150 MG CAPS (RANITIDINE HCL) Take 1 capsule by mouth twice a day  #60 Capsule x 11   Entered and Authorized by:   Zachery Dauer MD   Signed by:   Zachery Dauer MD on 03/16/2010   Method used:   Historical   RxID:   [id number]  Form from [facility] was signed #60 wlsith 11 refills and placed in fax pile.

## 2010-05-14 NOTE — Letter (Signed)
Summary: Results Follow-up Letter  [facility]  [address]   [place], [place] [address]   Phone: [phone]  Fax: [phone]    02/09/2010  [address] [place], [place]  [address]  Dear Ms. [name],   The following are the results of your recent test(s): Patient: Desiree Parker Please call me about how to increase your potassium  Tests: (1) CMP with Estimated GFR (2402)   Order Note: FASTING   Sodium                    137 mEq/L                   135-145   Potassium            [L]  3.4 mEq/L                   3.5-5.3   Chloride                  97 mEq/L                    96-112   CO2                       27 mEq/L                    19-32   Glucose              [H]  116 mg/dL                   57-84   BUN                       12 mg/dL                    6-96   Creatinine                0.82 mg/dL                  0.40-1.20   Bilirubin, Total          1.0 mg/dL                   2.9-5.2   Alkaline Phosphatase      83 U/L                      39-117   AST/SGOT                  18 U/L                      0-37   ALT/SGPT                  13 U/L                      0-35   Total Protein             7.4 g/dL                    8.4-1.3   Albumin                   4.2 g/dL  3.5-5.2   Calcium                   9.8 mg/dL                   0.9-81.1 ! Est GFR, African American                             >60 mL/min                  >60 ! Est GFR, NonAfrican American                             >60 mL/min                  >60 Your blood count is normal. Tests: (2) CBC NO Diff (Complete Blood Count) (10000)   WBC                       7.8 K/uL                    4.0-10.5   RBC                       4.76 MIL/uL                 3.87-5.11   Hemoglobin                14.2 g/dL                   91.4-78.2   Hematocrit                42.2 %                      36.0-46.0   MCV                       88.7 fL                     78.0-100.0 ! MCH                        29.8 pg                     26.0-34.0   MCHC                      33.6 g/dL                   95.6-21.3   RDW                       13.0 %                      11.5-15.5   Platelet Count            346 K/uL                    150-400  Tests: (3) Lipid Profile (08657)   Cholesterol          [H]  216 mg/dL  0-200     ATP III Classification:           < 200        mg/dL        Desirable          200 - 239     mg/dL        Borderline High          >= 240        mg/dL        High         Triglyceride              126 mg/dL                   <161   HDL Cholesterol           66 mg/dL                    >09   Total Chol/HDL Ratio      3.3 Ratio  VLDL Cholesterol (Calc)                             25 mg/dL                    6-04  LDL Cholesterol (Calc)                        [H]  125 mg/dL                   5-40           Total Cholesterol/HDL Ratio:CHD Risk                            Coronary Heart Disease Risk Table                                            Men       Women              1/2 Average Risk              3.4        3.3                  Average Risk              5.0        4.4              2 X Average Risk              9.6        7.1              3 X Average Risk             23.4       11.0     Use the calculated Patient Ratio above and the CHD Risk table      to determine the patient's CHD Risk.     ATP III Classification (LDL):           < 100        mg/dL         Optimal  100 - 129     mg/dL         Near or Above Optimal          130 - 159     mg/dL         Borderline High          160 - 189     mg/dL         High           > 190        mg/dL         Very High       Document Creation Date: 01/30/2010 12:48 AM __________________________________________________________________________ Cholesterol LDL(Bad cholesterol):          Your goal is less than:   100      HDL (Good cholesterol):        Your goal is more than:     45 _________________________________________________________ I tried to call, but couldn't leave a message. Your LDL (bad cholesterol) is high and you should take a medication to lower it.   Sincerely,  Zachery Dauer MD [facility]            Appended Document: Results Follow-up Letter mailed

## 2010-05-14 NOTE — Miscellaneous (Signed)
Summary: change to Proair per [facility]  Clinical Lists Changes Done on recommend of [employer] Medications: Changed medication from VENTOLIN HFA 108 (90 BASE) MCG/ACT AERS (ALBUTEROL SULFATE) 2 puffs every four hours as needed cough [BMN] to PROAIR HFA 108 (90 BASE) MCG/ACT AERS (ALBUTEROL SULFATE)

## 2010-05-14 NOTE — Assessment & Plan Note (Signed)
Summary: cough/fever x 5 days   Vital Signs:  Patient profile:   65 year old female Weight:      180 pounds BMI:     35.28 O2 Sat:      96 % on Room air Temp:     98.1 degrees F oral Pulse rate:   71 / minute Pulse rhythm:   regular BP sitting:   102 / 68  (right arm) Cuff size:   large  Vitals Entered By: Loralee Pacas CMA (April 16, 2009 11:20 AM)  O2 Flow:  Room air CC: painful cough, some phlem (yellow) x 5 days   Primary Care Provider:  Zachery Dauer MD  CC:  painful cough and some phlem (yellow) x 5 days.  History of Present Illness: 64yo F w/ cough  Cough: semi-productive (nonbloody) x 5 days.  Worsening.  Associated fevers, night sweats, and chest discomfort with coughing.  No sick contacts.  No nasal congestion or rhinorrhea.  No sinus pressure or pain.  No worsening SOB from baseline except during coughing spells.  Not currently taking any cough suppressants.  Current Medications (verified): 1)  Aspirin Ec 81 Mg Tbec (Aspirin) .[name].. 1 Tablet By Mouth Once A Day 2)  Metformin Hcl 1000 Mg  Tabs (Metformin Hcl) .... Ake 1/2 Tab in A.m. and 1 Tab in P.m. 3)  Ranitidine Hcl 150 Mg Caps (Ranitidine Hcl) .... Take 1 Capsule By Mouth Twice A Day 4)  Hydrochlorothiazide 25 Mg  Tabs (Hydrochlorothiazide) .... Take One Tablet Daily 5)  Enalapril Maleate 10 Mg  Tabs (Enalapril Maleate) .... Take 1 Tab By Mouth Daily 6)  Tessalon Perles 100 Mg Caps (Benzonatate) .[name].. 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  No sick contacts.  No nasal congestion or rhinorrhea.  No sinus pressure or pain.  No worsening SOB from baseline except during coughing spells.  Physical Exam  General:  VS reviewed.  Non ill appearing, NAD.   Eyes:  no injected conjunctiva Mouth:  poor dentition; moist mucus membranes Neck:  supple full ROM Chest Wall:  nontender to palpation Lungs:  Moving air in all lung fields;  no crackles, wheezes Heart:  Normal rate and regular rhythm. S1 and S2 normal without gallop, murmur, click, rub or other extra sounds. Skin:  nl color and turgor Cervical Nodes:  no lymphadenopathy   Impression & Recommendations:  Problem # 1:  COUGH (ICD-786.2) Assessment Deteriorated Most likely acute viral bronchitis but given her age, worsening symptoms, and associated fevers/sweats, will plan to obtain CXR to r/o pna.  Will treat with tylenol for fever, mucinex DM for cough symptoms, and ask her to fill the tessalon perles rx if cough not adequately controlled with Mucinex  DM.  Will contact her at [phone] (home) with xray results.   Orders: CXR- 2view (CXR) [facility]- Est  Level 4 ([address])  Complete Medication List: 1)  Aspirin Ec 81 Mg Tbec (Aspirin) .[name].. 1 tablet by mouth once a day 2)  Metformin Hcl 1000 Mg Tabs (Metformin hcl) .... Ake 1/2 tab in a.m. and 1 tab in p.m. 3)  Ranitidine Hcl 150 Mg Caps (Ranitidine hcl) .... Take 1 capsule by mouth twice a day 4)  Hydrochlorothiazide 25 Mg Tabs (Hydrochlorothiazide) .... Take one tablet daily 5)  Enalapril Maleate 10 Mg Tabs (Enalapril maleate) .... Take 1 tab by mouth daily 6)  Tessalon Perles 100 Mg Caps  (Benzonatate) .[name].. 1 tab by mouth three times a day as needed for cough  Patient Instructions: 1)  Follow up with me next week to reassess your symptoms (ok to double book). 2)  Go pick up some Mucinex DM at the drug store and follow the instructions.  If it doesn't improve your cough, you can fill the prescription I provided today. 3)  We will check an xray to rule out pneumonia. 4)  Take tylenol 500mg  every 4 hours as needed for fever. Prescriptions: TESSALON PERLES 100 MG CAPS (BENZONATATE) 1 tab by mouth three times a day as needed for cough  #30 x 0   Entered and Authorized by:   Marisue Ivan  MD   Signed by:   Marisue Ivan  MD on 04/16/2009   Method used:   Print then Give to Patient   RxID:   [id number]

## 2010-05-14 NOTE — Progress Notes (Signed)
Summary: triage  Phone Note Call from Patient Call back at [phone]   Caller: Patient Summary of Call: Pt has been running a fever for about 3 days and now have chills. Initial call taken by: Clydell Hakim,  June 02, 2009 3:36 PM  Follow-up for Phone Call        does not have a thermometer. feels like she has a temp. also has a cold. took a goody's which brings it down, but fever returns. told her a good idea to be seen . appt with S. Saxon  tomorrow am. she has to arrange transportation so may have to reschedule. to drink plenty of fluids & take antipyretics regularly Follow-up by: Golden Circle RN,  June 02, 2009 3:39 PM

## 2010-05-14 NOTE — Progress Notes (Signed)
Summary: Phonecall about glucometer  Phone Note Outgoing Call   Call placed by: Zachery Dauer MD,  January 14, 2010 4:14 PM Summary of Call: I tried to call to get the name of the one touch machine she uses, but the number we have listed is not functioning.   Follow-up for Phone Call        Reached her on the new number. Her machine is a one-touch ultra. I'll fax an order to the pharmacy who will try to see if her insurance covers strips. She will call tomorrow to find out.  Follow-up by: Zachery Dauer MD,  January 14, 2010 4:20 PM    New/Updated Medications: ONETOUCH TEST  STRP (GLUCOSE BLOOD) Test blood fasting daily Prescriptions: ONETOUCH TEST  STRP (GLUCOSE BLOOD) Test blood fasting daily  #1 bottle x 11   Entered and Authorized by:   Zachery Dauer MD   Signed by:   Zachery Dauer MD on 01/14/2010   Method used:   Printed then faxed to ...       [facility] (retail)       [address]       [place], [place]  [address]       Ph: [phone]       Fax: [phone]   RxID:   [id number]

## 2010-05-14 NOTE — Assessment & Plan Note (Signed)
Summary: fever x 3 days & a cold/[place]/[facility]ale   Vital Signs:  Patient profile:   65 year old female Weight:      180.3 pounds Temp:     97.9 degrees F oral Pulse rate:   83 / minute BP sitting:   115 / 71  (right arm)  Vitals Entered By: Arlyss Repress CMA, (June 03, 2009 10:04 AM) CC: fever and cough x 3 days. Is Patient Diabetic? Yes Pain Assessment Patient in pain? no        Primary Care Provider:  Zachery Dauer MD  CC:  fever and cough x 3 days.[name]  History of Present Illness: patient c/o intermittent cough that began six days ago, coughing up small amounts of brownish phlegm associated with chills and night sweats that began on Sunday.  No aggravating factors, states she has been using Mucinex without good effect.  Current smoker with history of bronchitis.  Denies sob or chest pain, but admits to generalized malaise, no known fevers.  Habits & Providers  Alcohol-Tobacco-Diet     Tobacco Status: current     Tobacco Counseling: to quit use of tobacco products  Current Medications (verified): 1)  Aspirin Ec 81 Mg Tbec (Aspirin) .[name].. 1 Tablet By Mouth Once A Day 2)  Metformin Hcl 1000 Mg  Tabs (Metformin Hcl) .... Ake 1/2 Tab in A.m. and 1 Tab in P.m. 3)  Ranitidine Hcl 150 Mg Caps (Ranitidine Hcl) .... Take 1 Capsule By Mouth Twice A Day 4)  Hydrochlorothiazide 25 Mg  Tabs (Hydrochlorothiazide) .... Take One Tablet Daily 5)  Enalapril Maleate 10 Mg  Tabs (Enalapril Maleate) .... Take 1 Tab By Mouth Daily 6)  Tessalon Perles 100 Mg Caps (Benzonatate) .[name].. 1 Tab By Mouth Three Times A Day As Needed For Cough 7)  Guiatuss Ac 100-10 Mg/57ml Syrp (Guaifenesin-Codeine) .... Take 2 Tsps Every 6 Hours By Mouth Three Times A Day As Needed Cough-158ml 8)  Ventolin Hfa 108 (90 Base) Mcg/act Aers (Albuterol Sulfate) .... 2 Puffs Every Four Hours As Needed Cough  Allergies (verified): No Known Drug Allergies  Review of Systems General:  Complains of chills, fatigue, loss of appetite,  malaise, and sweats; denies fever. Eyes:  Denies discharge, eye irritation, and itching. ENT:  Denies decreased hearing, difficulty swallowing, earache, hoarseness, nasal congestion, postnasal drainage, sinus pressure, and sore throat. CV:  Denies chest pain or discomfort, difficulty breathing at night, fatigue, lightheadness, palpitations, and shortness of breath with exertion. Resp:  Complains of cough and sputum productive; denies chest discomfort, chest pain with inspiration, shortness of breath, and wheezing.  Physical Exam  General:  In no acute distress; alert,appropriate and cooperative throughout examination Head:  Normocephalic and atraumatic without obvious abnormalities. No apparent alopecia or balding. Eyes:  No corneal or conjunctival inflammation noted. Perrla.  Vision grossly normal. Ears:  External ear exam shows no significant lesions or deformities.  Otoscopic examination reveals clear canals, tympanic membranes are intact bilaterally without bulging, retraction, inflammation or discharge. Hearing is grossly normal bilaterally. Nose:  External nasal examination shows no deformity or inflammation. Nasal mucosa are pink and moist without lesions or exudates. Mouth:  Oral mucosa and oropharynx without lesions or exudates.   Chest Wall:  No deformities, masses, or tenderness noted. Lungs:  Normal respiratory effort, chest expands symmetrically. Lungs are clear to auscultation except dimished in right, no crackles or wheezes. Heart:  Normal rate and regular rhythm. S1 and S2 normal without gallop, murmur, click, rub or other extra sounds. Skin:  Intact without suspicious lesions or rashes Cervical Nodes:  No lymphadenopathy noted   Impression & Recommendations:  Problem # 1:  COUGH (ICD-786.2) Smoker, will benefit from bronchodilator and guiafensin cough syrup for symptom management, given diminished right lung sounds will obtain xray to evaluate. Orders: CXR- 2view  (CXR) [facility]- Est Level  3 (04540)  Complete Medication List: 1)  Aspirin Ec 81 Mg Tbec (Aspirin) .[name].. 1 tablet by mouth once a day 2)  Metformin Hcl 1000 Mg Tabs (Metformin hcl) .... Ake 1/2 tab in a.m. and 1 tab in p.m. 3)  Ranitidine Hcl 150 Mg Caps (Ranitidine hcl) .... Take 1 capsule by mouth twice a day 4)  Hydrochlorothiazide 25 Mg Tabs (Hydrochlorothiazide) .... Take one tablet daily 5)  Enalapril Maleate 10 Mg Tabs (Enalapril maleate) .... Take 1 tab by mouth daily 6)  Tessalon Perles 100 Mg Caps (Benzonatate) .[name].. 1 tab by mouth three times a day as needed for cough 7)  Guiatuss Ac 100-10 Mg/61ml Syrp (Guaifenesin-codeine) .... Take 2 tsps every 6 hours by mouth three times a day as needed cough-120ml 8)  Ventolin Hfa 108 (90 Base) Mcg/act Aers (Albuterol sulfate) .... 2 puffs every four hours as needed cough  Other Orders: A1C-[facility] ([id number])  Patient Instructions: 1)  Take medications as prescribed for cough. 2)  Chest xray and we will call if there any additional orders. 3)  Get plenty of rest and return if symptoms persist or worsen. 4)  Tobacco is very bad for your health and your loved ones ! You should stop smoking !  5)  [phone] Prescriptions: VENTOLIN HFA 108 (90 BASE) MCG/ACT AERS (ALBUTEROL SULFATE) 2 puffs every four hours as needed cough Brand medically necessary #1 x 2   Entered and Authorized by:   Luretha Murphy NP   Signed by:   Luretha Murphy NP on 06/03/2009   Method used:   Print then Give to Patient   RxID:   [id number] GUIATUSS AC 100-10 MG/5ML SYRP (GUAIFENESIN-CODEINE) take 2 tsps every 6 hours by mouth three times a day as needed cough-129ml Brand medically necessary #1 x 0   Entered and Authorized by:   Luretha Murphy NP   Signed by:   Luretha Murphy NP on 06/03/2009   Method used:   Print then Give to Patient   RxID:   [id number] VENTOLIN HFA 108 (90 BASE) MCG/ACT AERS (ALBUTEROL SULFATE) 2 puffs every four hours as needed cough Brand medically necessary  #0 x 0   Entered and Authorized by:   Luretha Murphy NP   Signed by:   Luretha Murphy NP on 06/03/2009   Method used:   Print then Give to Patient   RxID:   [id number] GUIATUSS AC 100-10 MG/5ML SYRP (GUAIFENESIN-CODEINE) take 2 tsps every 6 hours by mouth three times a day as needed cough Brand medically necessary #0 x 0   Entered and Authorized by:   Luretha Murphy NP   Signed by:   Luretha Murphy NP on 06/03/2009   Method used:   Print then Give to Patient   RxID:   [id number]   Laboratory Results   Blood Tests   Date/Time Received: June 03, 2009 10:14 AM  Date/Time Reported: June 03, 2009 10:37 AM   HGBA1C: 5.9%   (Normal Range: Non-Diabetic - 3-6%   Control Diabetic - 6-8%)  Comments: ...........test performed by..........[name] [place], SMA

## 2010-05-21 ENCOUNTER — Ambulatory Visit
Admission: RE | Admit: 2010-05-21 | Discharge: 2010-05-21 | Disposition: A | Discharge status: Home / Self Care | Payer: Self-pay | Source: Ambulatory Visit | Attending: Family Medicine | Admitting: Family Medicine

## 2010-05-21 DIAGNOSIS — Z1231 Encounter for screening mammogram for malignant neoplasm of breast: Secondary | ICD-10-CM

## 2010-08-21 ENCOUNTER — Other Ambulatory Visit: Payer: Self-pay | Admitting: Family Medicine

## 2010-08-21 NOTE — Telephone Encounter (Signed)
Please call her to schedule her overdue diabetes check-up with me.

## 2010-08-21 NOTE — Telephone Encounter (Signed)
Refill request

## 2010-08-21 NOTE — Telephone Encounter (Signed)
She needs an appointment before any more refills

## 2010-08-28 NOTE — Op Note (Signed)
NAME:  Desiree Parker, Desiree Parker                ACCOUNT NO.:  [id number]   MEDICAL RECORD NO.:  [id number]          PATIENT TYPE:  AMB   LOCATION:  ENDO                         FACILITY:  [facility]   PHYSICIAN:  John C. Madilyn Fireman, M.D.    DATE OF BIRTH:  20-Apr-1945   DATE OF PROCEDURE:  04/29/2004  DATE OF DISCHARGE:                                 OPERATIVE REPORT   PROCEDURE:  Colonoscopy.   Dictation ended here...      JCH/MEDQ  D:  04/29/2004  T:  04/29/2004  Job:  [id number]

## 2010-08-28 NOTE — Op Note (Signed)
NAME:  Desiree Parker, Desiree Parker                ACCOUNT NO.:  [id number]   MEDICAL RECORD NO.:  [id number]          PATIENT TYPE:  AMB   LOCATION:  ENDO                         FACILITY:  [facility]   PHYSICIAN:  John C. Madilyn Fireman, M.D.    DATE OF BIRTH:  12/20/1945   DATE OF PROCEDURE:  05/20/2004  DATE OF DISCHARGE:  04/29/2004                                 OPERATIVE REPORT   INDICATIONS FOR PROCEDURE:  Heme-positive stools.   PROCEDURE:  The patient was placed in the left lateral decubitus position  and placed on the pulse monitor with continuous low-flow oxygen delivered by  nasal cannula.  She was sedated with 100 mcg IV fentanyl and  9 milligrams  IV Versed.  The Olympus video colonoscope was inserted into the rectum and  advanced to the cecum, confirmed by transillumination of McBurney's point  and visualization of the ileocecal valve and appendiceal orifice. Prep was  excellent. The cecum, ascending, transverse, descending, and sigmoid colon  all appeared normal with no masses, polyps, diverticula or other mucosal  abnormalities. The rectum likewise appeared normal. Retroflexed view of the  anus revealed small internal hemorrhoids. No other abnormality. The scope  was then withdrawn and the patient returned to the recovery room in stable  condition. She tolerated the procedure well. There were no immediate  complications.   IMPRESSION:  Internal hemorrhoids, otherwise normal colonoscopy.   PLAN:  Will repeat Hemoccults in one month. Consider further workup if  persistently positive.      JCH/MEDQ  D:  05/20/2004  T:  05/20/2004  Job:  [id number]

## 2010-09-21 ENCOUNTER — Other Ambulatory Visit: Payer: Self-pay | Admitting: Family Medicine

## 2010-09-21 NOTE — Telephone Encounter (Signed)
Refill request

## 2010-09-25 ENCOUNTER — Encounter: Payer: Self-pay | Admitting: Family Medicine

## 2010-10-12 ENCOUNTER — Other Ambulatory Visit: Payer: Self-pay | Admitting: Family Medicine

## 2010-10-12 NOTE — Telephone Encounter (Signed)
Refill request

## 2010-10-13 ENCOUNTER — Ambulatory Visit: Payer: Medicare Other | Admitting: Family Medicine

## 2010-10-15 ENCOUNTER — Telehealth: Payer: Self-pay | Admitting: Family Medicine

## 2010-10-15 MED ORDER — METFORMIN HCL 1000 MG PO TABS: ORAL_TABLET | ORAL | Status: DC

## 2010-10-15 NOTE — Telephone Encounter (Signed)
Hydrochlorothiazide, Metformin, Zantac, Vasotec were refilled, but she did not receive the Metformin and the pharmacy said it was rejected by the provider and she would like to know why.

## 2010-10-15 NOTE — Telephone Encounter (Signed)
Addended by: Zachery Dauer on: 10/15/2010 06:35 PM   Modules accepted: Orders

## 2010-10-15 NOTE — Telephone Encounter (Signed)
Called pt and explained, that the reason for denial of refill is due to her not having OV with Dr.Hale. I told the pt to make sure to keep her upcoming appt with Dr.Hale on 10-27-10. Will ask Dr.Hale to may refill her medication until then, but do not know if he would. Advised pt to call her pharmacy later and check. Pt agreed. Fwd. To Dr.Hale .Arlyss Repress

## 2010-10-15 NOTE — Telephone Encounter (Signed)
Metformin Rx sent in for one month. This is a chronic problem with her not wanting to followup

## 2010-10-27 ENCOUNTER — Encounter: Payer: Self-pay | Admitting: Family Medicine

## 2010-10-27 ENCOUNTER — Ambulatory Visit (INDEPENDENT_AMBULATORY_CARE_PROVIDER_SITE_OTHER): Payer: Medicare Other | Admitting: Family Medicine

## 2010-10-27 DIAGNOSIS — E785 Hyperlipidemia, unspecified: Secondary | ICD-10-CM

## 2010-10-27 DIAGNOSIS — E119 Type 2 diabetes mellitus without complications: Secondary | ICD-10-CM

## 2010-10-27 DIAGNOSIS — J449 Chronic obstructive pulmonary disease, unspecified: Secondary | ICD-10-CM | POA: Insufficient documentation

## 2010-10-27 DIAGNOSIS — I10 Essential (primary) hypertension: Secondary | ICD-10-CM

## 2010-10-27 DIAGNOSIS — K219 Gastro-esophageal reflux disease without esophagitis: Secondary | ICD-10-CM

## 2010-10-27 LAB — POCT GLYCOSYLATED HEMOGLOBIN (HGB A1C): Hemoglobin A1C: 5.5

## 2010-10-27 MED ORDER — METFORMIN HCL 1000 MG PO TABS: 500.0000 mg | ORAL_TABLET | Freq: Two times a day (BID) | ORAL | Status: DC

## 2010-10-27 MED ORDER — ALBUTEROL SULFATE HFA 108 (90 BASE) MCG/ACT IN AERS: 2.0000 | INHALATION_SPRAY | RESPIRATORY_TRACT | Status: DC | PRN

## 2010-10-27 NOTE — Progress Notes (Signed)
  Subjective:    Patient ID: Desiree Parker, female    DOB: 25-Dec-1945, 65 y.o.   MRN: [id number]  HPI has generally been feeling well and has had no hypoglycemic episodes. Her glucometer is growing not currently not working. She hasn't seen her eye doctor this year because of $40 co[facility]pay at [facility]. She will has been told she has cataracts but does not feel that this is significantly affecting her vision at this point. She plans to make an appointment with a new provider. Occasional diarrhea.  She hurt her right knee about 3 weeks ago apparently twisting or putting something in her trunk to use a cane or walker to for couple weeks but has done better the past week  She used her albuterol MDI about a week ago which he had a cold, but she believes it is running out. Only has to use it occasionally  Review of Systems  Constitutional: Negative for activity change and appetite change.  Respiratory: Positive for wheezing. Negative for apnea, chest tightness and shortness of breath.   Cardiovascular: Negative for chest pain.  Musculoskeletal: Positive for joint swelling and arthralgias.       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   Abdominal: Soft. Bowel sounds are normal. She exhibits no distension and no mass. There is no tenderness.  Musculoskeletal: She exhibits no edema.       He is tender along the medial joint line and medial patellar surface. The joint is stable without laxity or tenderness on stressing the collateral ligaments. Her is no effusion or warmth.   Skin: Skin is warm and dry.  Psychiatric: She has a normal mood and affect. Her behavior is normal.          Assessment & Plan:

## 2010-10-27 NOTE — Patient Instructions (Addendum)
Return in 2 months for pap smear.  I will let you know your lab results.  Good job keeping your weight down. Try to continue losing because you are still in the obese range. Continue walking and gradually increase to 30 minutes at a time.

## 2010-10-27 NOTE — Assessment & Plan Note (Signed)
Her A1c has decreased to 5.5 so I believe we can decrease her metformin dose to 500 mg twice daily.

## 2010-10-27 NOTE — Assessment & Plan Note (Signed)
No pain back or down leg for some time

## 2010-10-27 NOTE — Assessment & Plan Note (Signed)
Rarely at night. Takes mustard which works wonders.

## 2010-10-27 NOTE — Assessment & Plan Note (Signed)
Close to goal of 130/80. I encouraged avoidance of salt

## 2010-10-27 NOTE — Assessment & Plan Note (Signed)
Controlled on current medication ?

## 2010-10-28 ENCOUNTER — Other Ambulatory Visit: Payer: Self-pay | Admitting: Family Medicine

## 2010-10-28 DIAGNOSIS — E119 Type 2 diabetes mellitus without complications: Secondary | ICD-10-CM

## 2010-10-28 LAB — BASIC METABOLIC PANEL
CO2: 27 mEq/L (ref 19–32)
Glucose, Bld: 85 mg/dL (ref 70–99)
Potassium: 4.3 mEq/L (ref 3.5–5.3)
Sodium: 138 mEq/L (ref 135–145)

## 2010-10-28 MED ORDER — METFORMIN HCL 1000 MG PO TABS: 500.0000 mg | ORAL_TABLET | Freq: Two times a day (BID) | ORAL | Status: DC

## 2010-10-28 NOTE — Telephone Encounter (Signed)
Printed to fax to clarify since the old sig was inadvertently included in the RX.

## 2011-01-11 ENCOUNTER — Telehealth: Payer: Self-pay | Admitting: Family Medicine

## 2011-01-11 NOTE — Telephone Encounter (Signed)
Form from [facility] for diabetic shoes returned indicating I will completed it when she comes in for follow up visit, which is overdue.

## 2011-02-19 ENCOUNTER — Other Ambulatory Visit: Payer: Self-pay | Admitting: Family Medicine

## 2011-02-19 DIAGNOSIS — I10 Essential (primary) hypertension: Secondary | ICD-10-CM

## 2011-02-19 NOTE — Telephone Encounter (Signed)
Refill request

## 2011-04-21 ENCOUNTER — Other Ambulatory Visit: Payer: Self-pay | Admitting: Family Medicine

## 2011-04-21 NOTE — Telephone Encounter (Signed)
Refill request

## 2011-05-21 ENCOUNTER — Other Ambulatory Visit: Payer: Self-pay | Admitting: Family Medicine

## 2011-05-21 NOTE — Telephone Encounter (Signed)
Refill request

## 2011-05-22 NOTE — Telephone Encounter (Signed)
Refill request

## 2011-05-28 ENCOUNTER — Other Ambulatory Visit: Payer: Self-pay | Admitting: Family Medicine

## 2011-05-29 NOTE — Telephone Encounter (Signed)
Refill request

## 2011-05-31 NOTE — Telephone Encounter (Signed)
Refill request

## 2011-06-09 ENCOUNTER — Telehealth: Payer: Self-pay | Admitting: Family Medicine

## 2011-06-09 NOTE — Telephone Encounter (Signed)
Patient is asking for a refill on Metformin, Zantax, HCTZ and Vasotec sent to [facility] on [address] at least to last until her appt on 3/21.

## 2011-06-09 NOTE — Telephone Encounter (Signed)
Forward to PCP for refill request 

## 2011-06-10 ENCOUNTER — Telehealth: Payer: Self-pay | Admitting: Family Medicine

## 2011-06-10 DIAGNOSIS — E119 Type 2 diabetes mellitus without complications: Secondary | ICD-10-CM

## 2011-06-10 MED ORDER — METFORMIN HCL 1000 MG PO TABS: 500.0000 mg | ORAL_TABLET | Freq: Two times a day (BID) | ORAL | Status: DC

## 2011-06-10 MED ORDER — HYDROCHLOROTHIAZIDE 12.5 MG PO CAPS: 12.5000 mg | ORAL_CAPSULE | Freq: Every day | ORAL | Status: DC

## 2011-06-10 MED ORDER — RANITIDINE HCL 150 MG PO CAPS: 150.0000 mg | ORAL_CAPSULE | Freq: Two times a day (BID) | ORAL | Status: DC

## 2011-06-10 MED ORDER — ENALAPRIL MALEATE 10 MG PO TABS: 10.0000 mg | ORAL_TABLET | Freq: Every day | ORAL | Status: DC

## 2011-06-10 NOTE — Telephone Encounter (Signed)
Overdue for appointment. Given enough until latter March appointment

## 2011-06-15 ENCOUNTER — Ambulatory Visit: Payer: Medicare Other | Admitting: Family Medicine

## 2011-07-01 ENCOUNTER — Telehealth: Payer: Self-pay | Admitting: *Deleted

## 2011-07-01 ENCOUNTER — Ambulatory Visit: Payer: Medicare Other

## 2011-07-01 NOTE — Telephone Encounter (Signed)
Called pt. Explained that Dr.Hale has not seen her since 10/2010 and in order to refill medications, she needs to have an OV with Dr.Hale. Pt agreed and will make sure, that she will have transportation to her next appt. She had transportation through [facility], but they did not pick her up. I advised the pt to call [facility] to inform them. Pt agreed. Lorenda Hatchet, Renato Battles

## 2011-07-27 ENCOUNTER — Ambulatory Visit (INDEPENDENT_AMBULATORY_CARE_PROVIDER_SITE_OTHER): Payer: Medicare Other | Admitting: Family Medicine

## 2011-07-27 ENCOUNTER — Encounter: Payer: Self-pay | Admitting: Family Medicine

## 2011-07-27 VITALS — BP 121/79 | HR 83 | Ht 60.0 in | Wt 186.0 lb

## 2011-07-27 DIAGNOSIS — E785 Hyperlipidemia, unspecified: Secondary | ICD-10-CM

## 2011-07-27 DIAGNOSIS — E119 Type 2 diabetes mellitus without complications: Secondary | ICD-10-CM

## 2011-07-27 DIAGNOSIS — M79672 Pain in left foot: Secondary | ICD-10-CM | POA: Insufficient documentation

## 2011-07-27 DIAGNOSIS — K219 Gastro-esophageal reflux disease without esophagitis: Secondary | ICD-10-CM

## 2011-07-27 DIAGNOSIS — I10 Essential (primary) hypertension: Secondary | ICD-10-CM

## 2011-07-27 DIAGNOSIS — M79609 Pain in unspecified limb: Secondary | ICD-10-CM

## 2011-07-27 DIAGNOSIS — M2010 Hallux valgus (acquired), unspecified foot: Secondary | ICD-10-CM

## 2011-07-27 LAB — COMPREHENSIVE METABOLIC PANEL
AST: 27 U/L (ref 0–37)
Alkaline Phosphatase: 97 U/L (ref 39–117)
BUN: 18 mg/dL (ref 6–23)
Calcium: 9.8 mg/dL (ref 8.4–10.5)
Creat: 0.79 mg/dL (ref 0.50–1.10)
Total Bilirubin: 0.6 mg/dL (ref 0.3–1.2)

## 2011-07-27 LAB — POCT GLYCOSYLATED HEMOGLOBIN (HGB A1C): Hemoglobin A1C: 5.8

## 2011-07-27 MED ORDER — ZOSTER VACCINE LIVE 19400 UNT/0.65ML ~~LOC~~ SOLR: 0.6500 mL | Freq: Once | SUBCUTANEOUS | Status: AC

## 2011-07-27 NOTE — Patient Instructions (Signed)
I will send your your lab results  If the uric acid level is high, you probably had gout and we will have to stop the HCTZ  Your blood pressure and your diabetes is well controlled now  Please return to see Dr Sheffield Slider in 6 months.

## 2011-07-27 NOTE — Assessment & Plan Note (Signed)
well controlled  

## 2011-07-27 NOTE — Progress Notes (Signed)
  Subjective:    Patient ID: Desiree Parker, female    DOB: 23-Jun-1945, 66 y.o.   MRN: [id number]  HPI Episode of left foot pain two weeks ago for which she took Advil 400 mg bid for a few days with improvement. Friend said it could be gout. Chronically tender over left  foot bunion despite wearing 65 year old diabetes shoes.   Not checking her cbg's due to machine no longer working. Feels hypoglycemic if she misses a meal. Unhappy that she has gained weight, but not getting any exercise.   Hasn't seen an eye doctor recently, but will call Dr Nile Riggs.   Would like her refills for 3 months at a time. I explained again the necessity of lab test monitoring for adverse effects.    Review of Systems     Objective:   Physical Exam  Vitals reviewed. Constitutional:       Generalized obesity  Cardiovascular: Normal rate, regular rhythm and normal heart sounds.   No murmur heard. Pulmonary/Chest: Effort normal and breath sounds normal.  Musculoskeletal: She exhibits edema.       1+ ankle edema  Erythema and warmth 3x4 cm anterior midfoot on left where she believes she had gout  Neurological: She is alert.  Psychiatric: She has a normal mood and affect. Her behavior is normal.          Assessment & Plan:

## 2011-07-27 NOTE — Assessment & Plan Note (Signed)
Recommended new diabetic shoes and cutting bunion hole out of an old pair if they continue to rub there.

## 2011-07-27 NOTE — Assessment & Plan Note (Signed)
If uric acid high, will have to replace HCTZ

## 2011-07-27 NOTE — Assessment & Plan Note (Addendum)
well controlled, could try decreasing Metformin if hypoglycemia documented. Given a new meter.

## 2011-07-28 LAB — CBC
HCT: 39.7 % (ref 36.0–46.0)
Hemoglobin: 13.4 g/dL (ref 12.0–15.0)
MCH: 29.9 pg (ref 26.0–34.0)
MCV: 88.6 fL (ref 78.0–100.0)
RBC: 4.48 MIL/uL (ref 3.87–5.11)

## 2011-07-29 ENCOUNTER — Other Ambulatory Visit: Payer: Self-pay | Admitting: Family Medicine

## 2011-07-29 DIAGNOSIS — M79672 Pain in left foot: Secondary | ICD-10-CM

## 2011-07-29 DIAGNOSIS — E119 Type 2 diabetes mellitus without complications: Secondary | ICD-10-CM

## 2011-07-29 DIAGNOSIS — E785 Hyperlipidemia, unspecified: Secondary | ICD-10-CM

## 2011-07-29 DIAGNOSIS — I10 Essential (primary) hypertension: Secondary | ICD-10-CM

## 2011-07-29 MED ORDER — SIMVASTATIN 40 MG PO TABS: 40.0000 mg | ORAL_TABLET | Freq: Every day | ORAL | Status: DC

## 2011-07-29 MED ORDER — HYDROCHLOROTHIAZIDE 12.5 MG PO CAPS: 12.5000 mg | ORAL_CAPSULE | Freq: Every day | ORAL | Status: DC

## 2011-07-29 MED ORDER — ENALAPRIL MALEATE 10 MG PO TABS: 10.0000 mg | ORAL_TABLET | Freq: Every day | ORAL | Status: DC

## 2011-07-29 MED ORDER — ALBUTEROL SULFATE HFA 108 (90 BASE) MCG/ACT IN AERS: 2.0000 | INHALATION_SPRAY | RESPIRATORY_TRACT | Status: DC | PRN

## 2011-07-29 MED ORDER — METFORMIN HCL 1000 MG PO TABS: 500.0000 mg | ORAL_TABLET | Freq: Two times a day (BID) | ORAL | Status: DC

## 2011-07-29 MED ORDER — RANITIDINE HCL 150 MG PO CAPS: 150.0000 mg | ORAL_CAPSULE | Freq: Two times a day (BID) | ORAL | Status: DC

## 2011-07-29 NOTE — Telephone Encounter (Signed)
I called and gave her the lab results. She admits to not taking the Simvastatin and says she will if I sent it in.

## 2011-07-29 NOTE — Telephone Encounter (Signed)
Redone, since printed in error

## 2011-08-19 ENCOUNTER — Telehealth: Payer: Self-pay | Admitting: Family Medicine

## 2011-08-19 NOTE — Telephone Encounter (Signed)
Pt has been having diarrhea/nausea/vomiting this week and wants to talk to nurse to see what she can do.

## 2011-08-19 NOTE — Telephone Encounter (Signed)
Returned call to patient.  Has been having vomiting and diarrhea.  Started at 11:00pm last night and lasted until 3:00am.  Has been eating tomato soup.  CBG yesterday 79 and has not checked it today.  Explained BRAT diet to patient and infomed to monitor hydration.  Encouraged to avoid milk products and sugary drinks and to drink small sips.  Patient verbalized understanding and will call back for appt as needed.  Gaylene Brooks, RN

## 2011-08-20 NOTE — Telephone Encounter (Signed)
I called her and she is a little better, with no more diarrhea. She is still nauseated but is holding down broth. She is to call if she worsens again.

## 2011-09-08 ENCOUNTER — Telehealth: Payer: Self-pay | Admitting: Family Medicine

## 2011-09-08 MED ORDER — ATORVASTATIN CALCIUM 20 MG PO TABS: 20.0000 mg | ORAL_TABLET | Freq: Every day | ORAL | Status: DC

## 2011-09-08 NOTE — Telephone Encounter (Signed)
When pt takes the simvastatin, she is up all night with diarrhea.  Wants to know what she should do.

## 2011-09-08 NOTE — Telephone Encounter (Signed)
I phoned her and she's willing to try a different statin.

## 2011-09-08 NOTE — Telephone Encounter (Signed)
Returned call to patient.  States she has had vomiting and diarrhea "shortly after starting medication."  Patient stopped medication and symptoms  resolved.  Restarted medication and symptoms returned.  Has now stopped medication completely.  Will route note to Dr. Sheffield Slider for advice and call patient back.  Gaylene Brooks, RN

## 2011-10-20 ENCOUNTER — Other Ambulatory Visit: Payer: Self-pay | Admitting: Family Medicine

## 2012-08-01 ENCOUNTER — Encounter: Payer: Self-pay | Admitting: Family Medicine

## 2012-08-01 ENCOUNTER — Ambulatory Visit (INDEPENDENT_AMBULATORY_CARE_PROVIDER_SITE_OTHER): Payer: Medicare Other | Admitting: Family Medicine

## 2012-08-01 VITALS — BP 137/85 | HR 77 | Ht 60.0 in | Wt 191.0 lb

## 2012-08-01 DIAGNOSIS — E119 Type 2 diabetes mellitus without complications: Secondary | ICD-10-CM

## 2012-08-01 DIAGNOSIS — I10 Essential (primary) hypertension: Secondary | ICD-10-CM

## 2012-08-01 DIAGNOSIS — E785 Hyperlipidemia, unspecified: Secondary | ICD-10-CM

## 2012-08-01 DIAGNOSIS — Z23 Encounter for immunization: Secondary | ICD-10-CM

## 2012-08-01 DIAGNOSIS — J449 Chronic obstructive pulmonary disease, unspecified: Secondary | ICD-10-CM

## 2012-08-01 LAB — COMPREHENSIVE METABOLIC PANEL
Albumin: 4.1 g/dL (ref 3.5–5.2)
Alkaline Phosphatase: 90 U/L (ref 39–117)
BUN: 18 mg/dL (ref 6–23)
CO2: 27 mEq/L (ref 19–32)
Glucose, Bld: 86 mg/dL (ref 70–99)
Potassium: 4.1 mEq/L (ref 3.5–5.3)
Total Bilirubin: 0.7 mg/dL (ref 0.3–1.2)

## 2012-08-01 NOTE — Progress Notes (Signed)
  Subjective:    Patient ID: Desiree Parker, female    DOB: 1945-12-15, 67 y.o.   MRN: [id number]  HPI Diabetes mellitus - was feeling well so didn't come in for follow up. Hasn't tested sugars lacking a glucometer. No hypoglycemic symptoms. No chest pain or shortness of breath. Realizes that she's gained weight.   Osteoarthritis - mild bilateral knee pain.   Illiteracy - has a woman from [employer] with her. They help her with her finances.   Prevention - interest in getting Zostavax so given prescription.  Review of Systems     Objective:   Physical Exam  Cardiovascular: Normal rate and regular rhythm.   No murmur heard. Pulmonary/Chest: Effort normal and breath sounds normal. She has no rales.  Musculoskeletal: She exhibits no edema.  Psychiatric: She has a normal mood and affect. Her behavior is normal.          Assessment & Plan:

## 2012-08-01 NOTE — Assessment & Plan Note (Signed)
Taking Atorvastatin, not fasting so will check direct LDL

## 2012-08-01 NOTE — Assessment & Plan Note (Signed)
No recent breathing problems

## 2012-08-01 NOTE — Assessment & Plan Note (Signed)
Despite weight gain her A1c remains below goal.   Will continue medicines the same

## 2012-08-01 NOTE — Assessment & Plan Note (Signed)
Well controlled 

## 2012-08-01 NOTE — Patient Instructions (Addendum)
Please return to see your new doctor in 6 months.  I'll let you know if there are any problems with your lab results.   I'll send in the prescription for a glucometer and strips.

## 2012-08-02 ENCOUNTER — Encounter: Payer: Self-pay | Admitting: Family Medicine

## 2012-08-04 ENCOUNTER — Telehealth: Payer: Self-pay | Admitting: Family Medicine

## 2012-08-04 NOTE — Telephone Encounter (Signed)
Patient has had a cough since she came in on Tuesday and would like to speak to Dr. Sheffield Slider to find out what she can take for it.

## 2012-08-04 NOTE — Telephone Encounter (Signed)
Called pt. She reports, that she has a dry cough. Denies fever, pain, SOB. Cough started Wednesday. Would like to know, what she can take for it. I told the pt, that I would ask Dr.Hale and call her back. Pt agreed. Desiree Parker, Renato Battles

## 2012-08-05 ENCOUNTER — Other Ambulatory Visit: Payer: Self-pay | Admitting: Family Medicine

## 2012-08-07 ENCOUNTER — Encounter: Payer: Self-pay | Admitting: *Deleted

## 2012-08-07 NOTE — Telephone Encounter (Signed)
This encounter was created in error - please disregard.

## 2012-08-07 NOTE — Telephone Encounter (Signed)
Patient is calling because she still hasn't heard back from Dr. Sheffield Slider.  She said that she has been feeling bad since she got the flu or pneumonia shot and she needs to know what she needs to do.  She also said that that she is still waiting for all of her medicines to be sent to [facility] on [address].

## 2012-08-08 MED ORDER — RANITIDINE HCL 150 MG PO TABS: 150.0000 mg | ORAL_TABLET | Freq: Two times a day (BID) | ORAL | Status: DC | PRN

## 2012-08-08 MED ORDER — RANITIDINE HCL 150 MG PO TABS: 150.0000 mg | ORAL_TABLET | Freq: Two times a day (BID) | ORAL | Status: DC

## 2012-08-08 MED ORDER — ATORVASTATIN CALCIUM 20 MG PO TABS: 20.0000 mg | ORAL_TABLET | Freq: Every day | ORAL | Status: DC

## 2012-08-08 MED ORDER — GLUCOSE BLOOD VI STRP: ORAL_STRIP | Status: DC

## 2012-08-08 MED ORDER — ACCU-CHEK AVIVA PLUS W/DEVICE KIT: 1.0000 | PACK | Freq: Two times a day (BID) | Status: DC

## 2012-08-08 MED ORDER — METFORMIN HCL 1000 MG PO TABS: 500.0000 mg | ORAL_TABLET | Freq: Two times a day (BID) | ORAL | Status: DC

## 2012-08-08 MED ORDER — ALBUTEROL SULFATE HFA 108 (90 BASE) MCG/ACT IN AERS: 2.0000 | INHALATION_SPRAY | RESPIRATORY_TRACT | Status: DC | PRN

## 2012-08-08 NOTE — Telephone Encounter (Signed)
I spoke with Desiree Parker, discussed the letter I sent her, and renewed her medications and sent in her glucometer after speaking with the [facility] staff about which one is covered.

## 2012-08-08 NOTE — Addendum Note (Signed)
Addended by: Zachery Dauer on: 08/08/2012 10:16 AM   Modules accepted: Orders

## 2012-08-15 ENCOUNTER — Ambulatory Visit (INDEPENDENT_AMBULATORY_CARE_PROVIDER_SITE_OTHER): Payer: Medicare Other | Admitting: Family Medicine

## 2012-08-15 ENCOUNTER — Ambulatory Visit: Payer: Medicare Other | Admitting: Family Medicine

## 2012-08-15 ENCOUNTER — Encounter: Payer: Self-pay | Admitting: Family Medicine

## 2012-08-15 VITALS — BP 136/82 | HR 66 | Temp 98.9°F | Ht 60.0 in | Wt 188.2 lb

## 2012-08-15 DIAGNOSIS — J209 Acute bronchitis, unspecified: Secondary | ICD-10-CM

## 2012-08-15 DIAGNOSIS — J208 Acute bronchitis due to other specified organisms: Secondary | ICD-10-CM

## 2012-08-15 DIAGNOSIS — F17200 Nicotine dependence, unspecified, uncomplicated: Secondary | ICD-10-CM

## 2012-08-15 MED ORDER — GUAIFENESIN-CODEINE 100-10 MG/5ML PO SOLN: 10.0000 mL | Freq: Three times a day (TID) | ORAL | Status: DC | PRN

## 2012-08-15 NOTE — Progress Notes (Signed)
  Subjective:    Patient ID: Desiree Parker, female    DOB: 02-02-1946, 67 y.o.   MRN: [id number]  HPI The URI that she developed after receiving the Pneumovax moved into her chest after causing hoarseness. She's been taking Albuterol MDI twice daily which she thinks helps. The cough is productive, but she hasn't had fever, chills, or chest pain. Would like a better cough medicine.    Review of Systems     Objective:   Physical Exam  Constitutional:  Generalized obesity   Cardiovascular: Normal rate and regular rhythm.   Pulmonary/Chest: Effort normal and breath sounds normal. She has no wheezes. She has no rales.  Musculoskeletal: She exhibits no edema.          Assessment & Plan:

## 2012-08-15 NOTE — Assessment & Plan Note (Signed)
No indication of complication Will treat with codeine containing cough med Given warning signs of complications

## 2012-08-15 NOTE — Patient Instructions (Addendum)
Please call if you develop second sickening with fever, etc.   Take the cough medicine as needed.

## 2012-10-11 ENCOUNTER — Encounter: Payer: Self-pay | Admitting: Family Medicine

## 2012-10-19 ENCOUNTER — Other Ambulatory Visit: Payer: Self-pay

## 2012-12-06 ENCOUNTER — Telehealth: Payer: Self-pay | Admitting: Family Medicine

## 2012-12-06 NOTE — Telephone Encounter (Signed)
For patients over age 66, particularly diabetics, recommend Loratadine rather than Benadryl, since the latter is on the Beers list of medications to be avoided in the elderly. It's not a big issue in this particular patient.

## 2012-12-06 NOTE — Telephone Encounter (Signed)
Pt has head congestion and coughing since June.  Wants to know if there is something over the counter that will break it up Please advise

## 2012-12-06 NOTE — Telephone Encounter (Signed)
Pt reports going on a cruise in may and has had chronic cough since then - has tried multiple otc meds with little relief - pt encouraged to come in for appointment - advised to try Mucinex and benadryl for temporary relief Wyatt Haste, RN-BSN

## 2012-12-06 NOTE — Telephone Encounter (Signed)
Will fwd to triage RN. Kolsen Choe L, CMA  

## 2013-01-12 ENCOUNTER — Encounter: Payer: Self-pay | Admitting: Family Medicine

## 2013-01-12 ENCOUNTER — Ambulatory Visit (INDEPENDENT_AMBULATORY_CARE_PROVIDER_SITE_OTHER): Payer: Medicare Other | Admitting: Family Medicine

## 2013-01-12 VITALS — BP 137/84 | HR 92 | Ht 60.0 in | Wt 185.0 lb

## 2013-01-12 DIAGNOSIS — E119 Type 2 diabetes mellitus without complications: Secondary | ICD-10-CM

## 2013-01-12 DIAGNOSIS — E785 Hyperlipidemia, unspecified: Secondary | ICD-10-CM

## 2013-01-12 DIAGNOSIS — I10 Essential (primary) hypertension: Secondary | ICD-10-CM

## 2013-01-12 MED ORDER — RANITIDINE HCL 150 MG PO TABS: 150.0000 mg | ORAL_TABLET | Freq: Two times a day (BID) | ORAL | Status: DC | PRN

## 2013-01-12 MED ORDER — ENALAPRIL MALEATE 10 MG PO TABS: 10.0000 mg | ORAL_TABLET | Freq: Every day | ORAL | Status: DC

## 2013-01-12 MED ORDER — HYDROCHLOROTHIAZIDE 12.5 MG PO CAPS: 12.5000 mg | ORAL_CAPSULE | Freq: Every day | ORAL | Status: DC

## 2013-01-12 NOTE — Assessment & Plan Note (Signed)
Patient is noncompliant with daily atorvastatin. Patient was counseled on the benefits of daily use of this medication.

## 2013-01-12 NOTE — Assessment & Plan Note (Signed)
Diabetes appears stable. Patient is due for recheck of hemoglobin A1c. We'll continue current dose of metformin. Patient has upcoming ophthalmology appointment. Patient declines podiatry appointment. She is no current evidence of peripheral neuropathy. Will check BMP to monitor for changes in kidney function.

## 2013-01-12 NOTE — Patient Instructions (Signed)
Diabetes - return to office for labwork. Make lab appointment.   High Blood Pressure - well controlled today, no changes to medications  High Cholesterol - I recommend that you take Lipitor daily.   Return to office in 3 months.

## 2013-01-12 NOTE — Assessment & Plan Note (Signed)
Blood pressure is well-controlled on current regimen of enalapril and hydrochlorothiazide.

## 2013-01-12 NOTE — Progress Notes (Signed)
  Subjective:    Patient ID: Desiree Parker, female    DOB: 26-Jan-1946, 67 y.o.   MRN: [id number]  HPI 67 year old female presents for followup of multiple medical conditions.  Hypertension-patient is currently on hydrochlorothiazide and enalapril. She takes his medications regularly, denies side effects, no vision changes, no headache, no chest pain  Non-insulin-dependent type 2 diabetes-patient currently on metformin 500 mg twice a day, denies side effects of these medications, she is due for her ophthalmology appointment which is made next week, she has never seen a podiatrist however patient declines referral today, she denies numbness or tingling in her feet  Hyperlipidemia-patient currently on atorvastatin, patient denies taking this medication on a daily basis, denies side effects  Social-patient recently returned from a cruise to the [place], patient is a former smoker   Review of Systems  Constitutional: Negative for fever, chills and fatigue.  Respiratory: Negative for choking and chest tightness.   Cardiovascular: Negative for chest pain and palpitations.  Gastrointestinal: Negative for nausea, diarrhea and constipation.       Objective:   Physical Exam Vitals: Reviewed General: Pleasant African American female, no acute distress HEENT: Normocephalic, pupils are equal round and reactive to light, extraocular movements are intact, no scleral icterus, moist mucous members, uvula midline, neck was supple, no anterior posterior cervical lymphadenopathy were appreciated Cardiac: Regular rate and rhythm, S1 and S2 present, no murmurs, no heaves or thrills Respiratory: Clear to auscultation bilaterally, normal effort Abdomen: Soft, nontender Extremities: No edema, foot examination performed which showed no evidence of ulceration or skin breakdown        Assessment & Plan:  Please see problem specific assessment and plan.

## 2013-01-23 ENCOUNTER — Other Ambulatory Visit (INDEPENDENT_AMBULATORY_CARE_PROVIDER_SITE_OTHER): Payer: Medicare Other

## 2013-01-23 DIAGNOSIS — E119 Type 2 diabetes mellitus without complications: Secondary | ICD-10-CM

## 2013-01-23 DIAGNOSIS — I10 Essential (primary) hypertension: Secondary | ICD-10-CM

## 2013-01-23 LAB — POCT GLYCOSYLATED HEMOGLOBIN (HGB A1C): Hemoglobin A1C: 5.8

## 2013-01-23 NOTE — Progress Notes (Signed)
BMP AND A1C DONE TODAY Desiree Parker 

## 2013-01-24 ENCOUNTER — Encounter: Payer: Self-pay | Admitting: Family Medicine

## 2013-01-24 LAB — BASIC METABOLIC PANEL
CO2: 30 mEq/L (ref 19–32)
Chloride: 100 mEq/L (ref 96–112)
Glucose, Bld: 108 mg/dL — ABNORMAL HIGH (ref 70–99)
Potassium: 3.7 mEq/L (ref 3.5–5.3)
Sodium: 137 mEq/L (ref 135–145)

## 2013-01-27 ENCOUNTER — Emergency Department (HOSPITAL_COMMUNITY): Payer: Medicare Other

## 2013-01-27 ENCOUNTER — Telehealth (HOSPITAL_COMMUNITY): Payer: Self-pay | Admitting: *Deleted

## 2013-01-27 ENCOUNTER — Emergency Department (HOSPITAL_COMMUNITY)
Admission: EM | Admit: 2013-01-27 | Discharge: 2013-01-27 | Disposition: A | Discharge status: Home / Self Care | Payer: Medicare Other | Attending: Emergency Medicine | Admitting: Emergency Medicine

## 2013-01-27 ENCOUNTER — Encounter (HOSPITAL_COMMUNITY): Payer: Self-pay | Admitting: Emergency Medicine

## 2013-01-27 DIAGNOSIS — E119 Type 2 diabetes mellitus without complications: Secondary | ICD-10-CM | POA: Insufficient documentation

## 2013-01-27 DIAGNOSIS — Z7982 Long term (current) use of aspirin: Secondary | ICD-10-CM | POA: Insufficient documentation

## 2013-01-27 DIAGNOSIS — Z8679 Personal history of other diseases of the circulatory system: Secondary | ICD-10-CM | POA: Insufficient documentation

## 2013-01-27 DIAGNOSIS — Z87891 Personal history of nicotine dependence: Secondary | ICD-10-CM | POA: Insufficient documentation

## 2013-01-27 DIAGNOSIS — E876 Hypokalemia: Secondary | ICD-10-CM | POA: Insufficient documentation

## 2013-01-27 DIAGNOSIS — Z8739 Personal history of other diseases of the musculoskeletal system and connective tissue: Secondary | ICD-10-CM | POA: Insufficient documentation

## 2013-01-27 DIAGNOSIS — Z79899 Other long term (current) drug therapy: Secondary | ICD-10-CM | POA: Insufficient documentation

## 2013-01-27 DIAGNOSIS — R112 Nausea with vomiting, unspecified: Secondary | ICD-10-CM | POA: Insufficient documentation

## 2013-01-27 DIAGNOSIS — Z8669 Personal history of other diseases of the nervous system and sense organs: Secondary | ICD-10-CM | POA: Insufficient documentation

## 2013-01-27 HISTORY — DX: Type 2 diabetes mellitus without complications: E11.9

## 2013-01-27 LAB — URINE MICROSCOPIC-ADD ON

## 2013-01-27 LAB — LIPASE, BLOOD: Lipase: 35 U/L (ref 11–59)

## 2013-01-27 LAB — CBC WITH DIFFERENTIAL/PLATELET
Basophils Relative: 0 % (ref 0–1)
Eosinophils Absolute: 0.4 10*3/uL (ref 0.0–0.7)
Eosinophils Relative: 3 % (ref 0–5)
HCT: 38 % (ref 36.0–46.0)
Lymphocytes Relative: 10 % — ABNORMAL LOW (ref 12–46)
Lymphs Abs: 1.4 10*3/uL (ref 0.7–4.0)
MCH: 30.4 pg (ref 26.0–34.0)
MCHC: 36.3 g/dL — ABNORMAL HIGH (ref 30.0–36.0)
MCV: 83.7 fL (ref 78.0–100.0)
Monocytes Absolute: 1 10*3/uL (ref 0.1–1.0)
Platelets: 301 10*3/uL (ref 150–400)
RBC: 4.54 MIL/uL (ref 3.87–5.11)
RDW: 13.1 % (ref 11.5–15.5)

## 2013-01-27 LAB — COMPREHENSIVE METABOLIC PANEL
ALT: 16 U/L (ref 0–35)
AST: 20 U/L (ref 0–37)
Albumin: 3.9 g/dL (ref 3.5–5.2)
CO2: 27 mEq/L (ref 19–32)
Chloride: 97 mEq/L (ref 96–112)
Creatinine, Ser: 0.86 mg/dL (ref 0.50–1.10)
GFR calc Af Amer: 80 mL/min — ABNORMAL LOW (ref >90)
GFR calc non Af Amer: 69 mL/min — ABNORMAL LOW (ref >90)
Sodium: 137 mEq/L (ref 135–145)
Total Bilirubin: 0.8 mg/dL (ref 0.3–1.2)

## 2013-01-27 LAB — URINALYSIS, ROUTINE W REFLEX MICROSCOPIC
Ketones, ur: 15 mg/dL — AB
Nitrite: NEGATIVE
Protein, ur: NEGATIVE mg/dL
Specific Gravity, Urine: 1.026 (ref 1.005–1.030)
pH: 5 (ref 5.0–8.0)

## 2013-01-27 LAB — POCT I-STAT TROPONIN I: Troponin i, poc: 0 ng/mL (ref 0.00–0.08)

## 2013-01-27 MED ORDER — POTASSIUM CHLORIDE CRYS ER 20 MEQ PO TBCR
40.0000 meq | EXTENDED_RELEASE_TABLET | Freq: Once | ORAL | Status: AC
  Administered 2013-01-27: 40 meq via ORAL
  Filled 2013-01-27: qty 2

## 2013-01-27 MED ORDER — ONDANSETRON 4 MG PO TBDP: 4.0000 mg | ORAL_TABLET | Freq: Three times a day (TID) | ORAL | Status: DC | PRN

## 2013-01-27 NOTE — ED Provider Notes (Signed)
6:57 PM I discussed the second troponin results with the patient and her family members.  Patient has no new complaints, is smiling, appropriately interactive.  Patient and family members voiced an understanding of return precautions, follow up instructions.  She was discharged in stable condition  Gerhard Munch, MD 01/27/13 1857

## 2013-01-27 NOTE — ED Notes (Signed)
Pharmacy calling to verify Rx for Zofran ODT. MD forgot to sign rx, but I verified Rx.

## 2013-01-27 NOTE — ED Notes (Signed)
Patient transported to X-ray 

## 2013-01-27 NOTE — ED Notes (Signed)
Dr. Ethelda Chick @ bedside

## 2013-01-27 NOTE — ED Notes (Addendum)
Pt states 2 separate episodes of emesis one on tues on this mornig Pt states vomited 4 times on tues,  Pt states this AM, ate chicken around 8 am, 2 episodes of emesis at around 11 am contents of emesis was what she has eaten.  Pt denies nausea at this time.

## 2013-01-27 NOTE — ED Notes (Signed)
Pt c/o vomiting x 5 days; pt denies elevated CBG

## 2013-01-27 NOTE — ED Provider Notes (Signed)
CSN: [id number]     Arrival date & time 01/27/13  1405 History   None    Chief Complaint  Patient presents with  . Emesis   (Consider location/radiation/quality/duration/timing/severity/associated sxs/prior Treatment) HPI Complains of vomiting onset 5 days ago. She vomited 4times, 5 days ago, twice 4 days ago did not vomit at all again until today when she vomited yellow material twice, 3 or 4 hours after eating. Family member who accompanies her states that she was sweaty this morning while vomiting. Patient had 20 minutes of diffuse abdominal pain this afternoon which resolved spontaneously. She denies having had any chest pain or shortness of breath. Does complain of mild nausea presently. No other associated symptoms. Last bowel movement yesterday normal. No urinary symptoms. Nothing makes symptoms better or worse. She treated herself with Pepto-Bismol for his ago without relief. Past Medical History  Diagnosis Date  . Alternating exotropia   . 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 Diabetes mellitus without complication    Past Surgical History  Procedure Laterality Date  . Tubal ligation  1975  . Cholecystectomy  1975  . Cesarean section  1975   Family History  Problem Relation Age of Onset  . Hypertension Mother   . Hypertension Sister   . Cancer Mother     lung  . Diabetes Mother   . Diabetes Father   . Asthma Son    History  Substance Use Topics  . Smoking status: Former Smoker -- 2.00 packs/day for 27 years    Types: Cigarettes    Quit date: 09/24/1988  . Smokeless tobacco: Current User    Types: Snuff  . Alcohol Use: 1.0 oz/week    2 drink(s) per week   OB History   Grav Para Term Preterm Abortions TAB SAB Ect Mult Living                 Review of Systems  Constitutional: Negative.   HENT: Negative.   Respiratory: Negative.   Cardiovascular: Negative.    Gastrointestinal: Positive for nausea, vomiting and abdominal pain.  Musculoskeletal: Negative.   Skin: Negative.   Neurological: Negative.   Psychiatric/Behavioral: Negative.   All other systems reviewed and are negative.    Allergies  Simvastatin  Home Medications   Current Outpatient Rx  Name  Route  Sig  Dispense  Refill  . albuterol (PROAIR HFA) 108 (90 BASE) MCG/ACT inhaler   Inhalation   Inhale 2 puffs into the lungs every 4 (four) hours as needed.   1 Inhaler   11   . aspirin 81 MG EC tablet   Oral   Take 81 mg by mouth daily.           [name] atorvastatin (LIPITOR) 20 MG tablet   Oral   Take 1 tablet (20 mg total) by mouth daily.   30 tablet   11   . Blood Glucose Monitoring Suppl (ACCU-CHEK AVIVA PLUS) W/DEVICE KIT   Does not apply   1 strip by Does not apply route 2 (two) times daily before a meal.   1 kit   1   . enalapril (VASOTEC) 10 MG tablet   Oral   Take 1 tablet (10 mg total) by mouth daily.   30 tablet   6   . glucose blood (ACCU-CHEK AVIVA) test strip      Use as instructed   100 each  12   . hydrochlorothiazide (MICROZIDE) 12.5 MG capsule   Oral   Take 1 capsule (12.5 mg total) by mouth daily.   30 capsule   5   . metFORMIN (GLUCOPHAGE) 1000 MG tablet   Oral   Take 0.5 tablets (500 mg total) by mouth 2 (two) times daily with a meal.   30 tablet   11   . ranitidine (ZANTAC) 150 MG tablet   Oral   Take 1 tablet (150 mg total) by mouth 2 (two) times daily as needed for heartburn.   60 tablet   5   . zoster vaccine live, PF, (ZOSTAVAX) 16109 UNT/0.65ML injection   Subcutaneous   Inject 0.65 mLs into the skin once.          BP 142/72  Pulse 87  Temp(Src) 97.9 F (36.6 C) (Oral)  Resp 18  SpO2 97% Physical Exam  Nursing note and vitals reviewed. Constitutional: She appears well-developed and well-nourished.  HENT:  Head: Normocephalic and atraumatic.  Eyes: Conjunctivae are normal.  Alternating esotropia  Neck:  Neck supple. No tracheal deviation present. No thyromegaly present.  Cardiovascular: Normal rate and regular rhythm.   No murmur heard. Pulmonary/Chest: Effort normal and breath sounds normal.  Abdominal: Soft. Bowel sounds are normal. She exhibits no distension. There is no tenderness.  Musculoskeletal: Normal range of motion. She exhibits no edema and no tenderness.  Neurological: She is alert. Coordination normal.  Skin: Skin is warm and dry. No rash noted.  Psychiatric: She has a normal mood and affect.    ED Course  Procedures (including critical care time) Labs Review Labs Reviewed  COMPREHENSIVE METABOLIC PANEL  CBC WITH DIFFERENTIAL  LIPASE, BLOOD  URINALYSIS, ROUTINE W REFLEX MICROSCOPIC   Imaging Review Dg Abd Acute W/chest  01/27/2013   CLINICAL DATA:  Vomiting, congestion, cough  EXAM: ACUTE ABDOMEN SERIES (ABDOMEN 2 VIEW & CHEST 1 VIEW)  COMPARISON:  06/03/2009  FINDINGS: There is no evidence of dilated bowel loops or free intraperitoneal air. No radiopaque calculi or other significant radiographic abnormality is seen. Heart size and mediastinal contours are within normal limits. Both lungs are clear.  IMPRESSION: Negative abdominal radiographs.  No acute cardiopulmonary disease.   Electronically Signed   By: Charlett Nose M.D.   On: 01/27/2013 15:46    EKG Interpretation     Ventricular Rate:  79 PR Interval:  124 QRS Duration: 92 QT Interval:  389 QTC Calculation: 446 R Axis:   -58 Text Interpretation:  Sinus rhythm Left anterior fascicular block Low voltage, precordial leads Consider anterior infarct No old tracing to compare          520 pm alert gcs 15 asymptomartic.  Results for orders placed during the hospital encounter of 01/27/13  COMPREHENSIVE METABOLIC PANEL      Result Value Range   Sodium 137  135 - 145 mEq/L   Potassium 3.4 (*) 3.5 - 5.1 mEq/L   Chloride 97  96 - 112 mEq/L   CO2 27  19 - 32 mEq/L   Glucose, Bld 115 (*) 70 - 99 mg/dL   BUN  19  6 - 23 mg/dL   Creatinine, Ser 6.04  0.50 - 1.10 mg/dL   Calcium 9.6  8.4 - 54.0 mg/dL   Total Protein 8.0  6.0 - 8.3 g/dL   Albumin 3.9  3.5 - 5.2 g/dL   AST 20  0 - 37 U/L   ALT 16  0 - 35 U/L   Alkaline  Phosphatase 100  39 - 117 U/L   Total Bilirubin 0.8  0.3 - 1.2 mg/dL   GFR calc non Af Amer 69 (*) >90 mL/min   GFR calc Af Amer 80 (*) >90 mL/min  CBC WITH DIFFERENTIAL      Result Value Range   WBC 13.6 (*) 4.0 - 10.5 K/uL   RBC 4.54  3.87 - 5.11 MIL/uL   Hemoglobin 13.8  12.0 - 15.0 g/dL   HCT 98.1  19.1 - 47.8 %   MCV 83.7  78.0 - 100.0 fL   MCH 30.4  26.0 - 34.0 pg   MCHC 36.3 (*) 30.0 - 36.0 g/dL   RDW 29.5  62.1 - 30.8 %   Platelets 301  150 - 400 K/uL   Neutrophils Relative % 80 (*) 43 - 77 %   Neutro Abs 10.8 (*) 1.7 - 7.7 K/uL   Lymphocytes Relative 10 (*) 12 - 46 %   Lymphs Abs 1.4  0.7 - 4.0 K/uL   Monocytes Relative 8  3 - 12 %   Monocytes Absolute 1.0  0.1 - 1.0 K/uL   Eosinophils Relative 3  0 - 5 %   Eosinophils Absolute 0.4  0.0 - 0.7 K/uL   Basophils Relative 0  0 - 1 %   Basophils Absolute 0.0  0.0 - 0.1 K/uL  LIPASE, BLOOD      Result Value Range   Lipase 35  11 - 59 U/L  URINALYSIS, ROUTINE W REFLEX MICROSCOPIC      Result Value Range   Color, Urine YELLOW  YELLOW   APPearance CLOUDY (*) CLEAR   Specific Gravity, Urine 1.026  1.005 - 1.030   pH 5.0  5.0 - 8.0   Glucose, UA NEGATIVE  NEGATIVE mg/dL   Hgb urine dipstick TRACE (*) NEGATIVE   Bilirubin Urine SMALL (*) NEGATIVE   Ketones, ur 15 (*) NEGATIVE mg/dL   Protein, ur NEGATIVE  NEGATIVE mg/dL   Urobilinogen, UA 1.0  0.0 - 1.0 mg/dL   Nitrite NEGATIVE  NEGATIVE   Leukocytes, UA SMALL (*) NEGATIVE  URINE MICROSCOPIC-ADD ON      Result Value Range   Squamous Epithelial / LPF FEW (*) RARE   WBC, UA 3-6  <3 WBC/hpf   RBC / HPF 0-2  <3 RBC/hpf   Bacteria, UA FEW (*) RARE  POCT I-STAT TROPONIN I      Result Value Range   Troponin i, poc 0.00  0.00 - 0.08 ng/mL   Comment 3             Dg Abd Acute W/chest  01/27/2013   CLINICAL DATA:  Vomiting, congestion, cough  EXAM: ACUTE ABDOMEN SERIES (ABDOMEN 2 VIEW & CHEST 1 VIEW)  COMPARISON:  06/03/2009  FINDINGS: There is no evidence of dilated bowel loops or free intraperitoneal air. No radiopaque calculi or other significant radiographic abnormality is seen. Heart size and mediastinal contours are within normal limits. Both lungs are clear.  IMPRESSION: Negative abdominal radiographs.  No acute cardiopulmonary disease.   Electronically Signed   By: Charlett Nose M.D.   On: 01/27/2013 15:46   xrays viewed by me  MDM  No diagnosis found.  pt signed out to Dr. Jeraldine Loots 520 pm Dx #1 nausea and vomitting #2 hypokalemia    Doug Sou, MD 01/27/13 1723

## 2013-01-28 LAB — URINE CULTURE: Colony Count: >=100000

## 2013-01-30 ENCOUNTER — Ambulatory Visit: Payer: Medicare Other | Admitting: Family Medicine

## 2013-01-31 ENCOUNTER — Telehealth: Payer: Self-pay | Admitting: Family Medicine

## 2013-01-31 NOTE — Telephone Encounter (Signed)
Aunt/Barbara phone [phone] called for triage for new patient.  Office staff scheduled new patient appointment for 04/02/13.  Informed RN cannot triage a new patient.  Desiree Parker reported her neice needed to be seen sooner has another provider,closer to home, who can see her 02/01/13. RN cancelled 04/02/13 appointment with Dr Selena Batten per caller request.

## 2013-02-02 ENCOUNTER — Telehealth: Payer: Self-pay

## 2013-02-02 NOTE — Telephone Encounter (Signed)
Pt advised if she continues to  Have vomiting and diarrhea to go to Urgent Care.  Pt verbalized understanding.  Teng Decou, Darlyne Russian, CMA

## 2013-02-02 NOTE — Telephone Encounter (Signed)
Patient has has diarrhea and vomiting and would like to speak to a nurse about what to do. Missed appt w/ [facility] on 10/21 due to transportation issues. Please call patient today.

## 2013-02-03 ENCOUNTER — Other Ambulatory Visit: Payer: Self-pay | Admitting: Family Medicine

## 2013-02-05 ENCOUNTER — Ambulatory Visit (INDEPENDENT_AMBULATORY_CARE_PROVIDER_SITE_OTHER): Payer: Medicare Other | Admitting: Family Medicine

## 2013-02-05 VITALS — BP 115/75 | HR 82 | Temp 97.5°F | Ht 60.0 in | Wt 177.0 lb

## 2013-02-05 DIAGNOSIS — R197 Diarrhea, unspecified: Secondary | ICD-10-CM

## 2013-02-05 DIAGNOSIS — R1110 Vomiting, unspecified: Secondary | ICD-10-CM

## 2013-02-05 LAB — CBC WITH DIFFERENTIAL/PLATELET
Eosinophils Absolute: 3.2 10*3/uL — ABNORMAL HIGH (ref 0.0–0.7)
HCT: 40.5 % (ref 36.0–46.0)
Hemoglobin: 14.2 g/dL (ref 12.0–15.0)
Lymphocytes Relative: 15 % (ref 12–46)
Lymphs Abs: 2.8 10*3/uL (ref 0.7–4.0)
MCH: 29.8 pg (ref 26.0–34.0)
MCHC: 35.1 g/dL (ref 30.0–36.0)
Monocytes Absolute: 1.1 10*3/uL — ABNORMAL HIGH (ref 0.1–1.0)
Monocytes Relative: 6 % (ref 3–12)
Neutrophils Relative %: 62 % (ref 43–77)
RBC: 4.76 MIL/uL (ref 3.87–5.11)

## 2013-02-05 LAB — BASIC METABOLIC PANEL
CO2: 28 mEq/L (ref 19–32)
Chloride: 96 mEq/L (ref 96–112)
Creat: 0.84 mg/dL (ref 0.50–1.10)
Potassium: 3.7 mEq/L (ref 3.5–5.3)
Sodium: 134 mEq/L — ABNORMAL LOW (ref 135–145)

## 2013-02-05 MED ORDER — ENALAPRIL MALEATE 10 MG PO TABS: 10.0000 mg | ORAL_TABLET | Freq: Every day | ORAL | Status: DC

## 2013-02-05 MED ORDER — HYDROCHLOROTHIAZIDE 12.5 MG PO CAPS: 12.5000 mg | ORAL_CAPSULE | Freq: Every day | ORAL | Status: DC

## 2013-02-05 MED ORDER — ATORVASTATIN CALCIUM 20 MG PO TABS: 20.0000 mg | ORAL_TABLET | Freq: Every day | ORAL | Status: DC

## 2013-02-05 MED ORDER — PROMETHAZINE HCL 12.5 MG PO TABS: 12.5000 mg | ORAL_TABLET | Freq: Four times a day (QID) | ORAL | Status: DC | PRN

## 2013-02-05 MED ORDER — RANITIDINE HCL 150 MG PO TABS: 150.0000 mg | ORAL_TABLET | Freq: Two times a day (BID) | ORAL | Status: DC

## 2013-02-05 MED ORDER — METFORMIN HCL 1000 MG PO TABS: 500.0000 mg | ORAL_TABLET | Freq: Two times a day (BID) | ORAL | Status: DC

## 2013-02-05 NOTE — Patient Instructions (Addendum)
Diarrhea Diarrhea is frequent loose and watery bowel movements. It can cause you to feel weak and dehydrated. Dehydration can cause you to become tired and thirsty, have a dry mouth, and have decreased urination that often is dark yellow. Diarrhea is a sign of another problem, most often an infection that will not last long. In most cases, diarrhea typically lasts 2 3 days. However, it can last longer if it is a sign of something more serious. It is important to treat your diarrhea as directed by your caregive to lessen or prevent future episodes of diarrhea. CAUSES  Some common causes include:  Gastrointestinal infections caused by viruses, bacteria, or parasites.  Food poisoning or food allergies.  Certain medicines, such as antibiotics, chemotherapy, and laxatives.  Artificial sweeteners and fructose.  Digestive disorders. HOME CARE INSTRUCTIONS  Ensure adequate fluid intake (hydration): have 1 cup (8 oz) of fluid for each diarrhea episode. Avoid fluids that contain simple sugars or sports drinks, fruit juices, whole milk products, and sodas. Your urine should be clear or pale yellow if you are drinking enough fluids. Hydrate with an oral rehydration solution that you can purchase at pharmacies, retail stores, and online. You can prepare an oral rehydration solution at home by mixing the following ingredients together:    tsp table salt.   tsp baking soda.   tsp salt substitute containing potassium chloride.  1  tablespoons sugar.  1 L (34 oz) of water.  Certain foods and beverages may increase the speed at which food moves through the gastrointestinal (GI) tract. These foods and beverages should be avoided and include:  Caffeinated and alcoholic beverages.  High-fiber foods, such as raw fruits and vegetables, nuts, seeds, and whole grain breads and cereals.  Foods and beverages sweetened with sugar alcohols, such as xylitol, sorbitol, and mannitol.  Some foods may be well  tolerated and may help thicken stool including:  Starchy foods, such as rice, toast, pasta, low-sugar cereal, oatmeal, grits, baked potatoes, crackers, and bagels.  Bananas.  Applesauce.  Add probiotic-rich foods to help increase healthy bacteria in the GI tract, such as yogurt and fermented milk products.  Wash your hands well after each diarrhea episode.  Only take over-the-counter or prescription medicines as directed by your caregiver.  Take a warm bath to relieve any burning or pain from frequent diarrhea episodes. SEEK IMMEDIATE MEDICAL CARE IF:   You are unable to keep fluids down.  You have persistent vomiting.  You have blood in your stool, or your stools are black and tarry.  You do not urinate in 6 8 hours, or there is only a small amount of very dark urine.  You have abdominal pain that increases or localizes.  You have weakness, dizziness, confusion, or lightheadedness.  You have a severe headache.  Your diarrhea gets worse or does not get better.  You have a fever or persistent symptoms for more than 2 3 days.  You have a fever and your symptoms suddenly get worse. MAKE SURE YOU:   Understand these instructions.  Will watch your condition.  Will get help right away if you are not doing well or get worse. Document Released: 03/19/2002 Document Revised: 03/15/2012 Document Reviewed: 12/05/2011 [employer] Patient Information 2014 [place], [place].   1. Get Lab work today 2. Obtain stool sample to check for infection 3. Refills sent to pharmacy.

## 2013-02-05 NOTE — Progress Notes (Signed)
  Subjective:    Patient ID: Desiree Parker, female    DOB: December 22, 1945, 67 y.o.   MRN: [id number]  HPI 67 year old female presents for evaluation of emesis and diarrhea, this is been going on for approximately 7-8 days, she has approximately 3-4 episodes of nonbilious/nonbloody emesis per day, she does have decreased by mouth intake however has been tolerating by mouth liquid, she also has associated diarrhea, she reports approximately 2-4 loose/watery stools per day, no melena, she initially did have some bilateral lower quadrant abdominal pain which has since resolved, she was seen and evaluated at [facility] emergency room approximately one week ago and the workup was unremarkable, occasionally she does have subjective fevers, no reported fevers at home   Review of Systems  Constitutional: Positive for fever and fatigue. Negative for chills.  HENT: Negative for congestion, postnasal drip, rhinorrhea and sinus pressure.   Cardiovascular: Negative for chest pain.  Gastrointestinal: Positive for nausea, vomiting and diarrhea. Negative for abdominal pain, constipation, blood in stool and abdominal distention.  Genitourinary: Negative for dysuria, urgency, frequency, hematuria, flank pain, decreased urine volume and pelvic pain.  Skin: Negative for rash.       Objective:   Physical Exam Vitals: Reviewed, afebrile General: Pleasant African American female, appears fatigued, nontoxic-appearing HEENT: pupils were equal round and reactive to light, alternating exotropia was present, no scleral icterus, nasal septum midline, no rhinorrhea, dry mucous membranes, uvula midline, no pharyngeal erythema or exudate noted, neck was supple Cardiac: Regular rate and rhythm, S1 and S2 present, no murmurs, no heaves or thrills Respiratory: Clear to auscultation bilaterally, normal effort Abdomen: Soft, minimal bilateral lower quadrant abdominal pain, bowel sounds are present bilaterally, no rebound, no  guarding Extremities: Capillary refill was one second in bilateral upper extremities, no edema in lower extremities  Reviewed recent emergency room visit. Abdominal imaging was unremarkable. CBC showed slightly elevated white count to 13.6 with left shift, potassium was low at 3.4. Microscopic urinalysis was unremarkable. Remainder of lab work was unremarkable.       Assessment & Plan:  Please see problem specific assessment and plan

## 2013-02-05 NOTE — Assessment & Plan Note (Signed)
Likely secondary to viral gastroenteritis, no clinical signs or symptoms of acute abdominal process -Patient will be prescribed Phenergan to use every 6 hours as needed -Counseled on bland diet and importance of fluid intake

## 2013-02-05 NOTE — Assessment & Plan Note (Addendum)
Patient presents with a 8 day history of diarrhea that is likely secondary to viral gastroenteritis -Given length of current episode will check stool culture, C. difficile, and ova/parasite -Will also recheck CBC and BMP.

## 2013-02-06 ENCOUNTER — Telehealth: Payer: Self-pay | Admitting: Family Medicine

## 2013-02-06 NOTE — Telephone Encounter (Signed)
Spoke to patient about elevated WBC, she is feeling better today, able to tolerate soup and PO liquids, decreased nausea/emesis/diarrhea today. Patient has not yet brought stool sample in for culture. Counseled her on the importance of having this lab specimen. Patient expressed understanding.

## 2013-02-06 NOTE — Telephone Encounter (Signed)
Pt returned call

## 2013-02-14 ENCOUNTER — Telehealth: Payer: Self-pay | Admitting: Family Medicine

## 2013-02-14 NOTE — Telephone Encounter (Signed)
Discussed results with patient. Her diarrhea has resolved and therefore she has not been able to provided a stool sample. Still having some nausea however is tolerating liquids. Counseled her to return to office if her appetite does not improve.

## 2013-03-01 ENCOUNTER — Telehealth: Payer: Self-pay | Admitting: Family Medicine

## 2013-03-01 NOTE — Telephone Encounter (Signed)
Will fwd to MD.  Othella Slappey L, CMA  

## 2013-03-01 NOTE — Telephone Encounter (Signed)
Pt called and needs refills on her enalapril, zocor, and hydrochlorothiazide sent to her pharmacy. jw

## 2013-03-05 MED ORDER — SIMVASTATIN 40 MG PO TABS: 40.0000 mg | ORAL_TABLET | Freq: Every day | ORAL | Status: DC

## 2013-03-05 NOTE — Telephone Encounter (Signed)
Refilled Zocor, patient not due for refills of HCTZ/Enalapril, I discussed this with the patient.

## 2013-04-02 ENCOUNTER — Ambulatory Visit: Payer: Medicare Other | Admitting: Family Medicine

## 2013-06-15 ENCOUNTER — Encounter (HOSPITAL_COMMUNITY): Payer: Self-pay | Admitting: Emergency Medicine

## 2013-06-15 ENCOUNTER — Emergency Department (INDEPENDENT_AMBULATORY_CARE_PROVIDER_SITE_OTHER)
Admission: EM | Admit: 2013-06-15 | Discharge: 2013-06-15 | Disposition: A | Discharge status: Nursing Home (Includes ICF) | Payer: Medicare HMO | Source: Home / Self Care | Attending: Emergency Medicine | Admitting: Emergency Medicine

## 2013-06-15 ENCOUNTER — Emergency Department (HOSPITAL_COMMUNITY)
Admission: EM | Admit: 2013-06-15 | Discharge: 2013-06-16 | Disposition: A | Discharge status: Home / Self Care | Payer: No Typology Code available for payment source | Attending: Emergency Medicine | Admitting: Emergency Medicine

## 2013-06-15 DIAGNOSIS — S46909A Unspecified injury of unspecified muscle, fascia and tendon at shoulder and upper arm level, unspecified arm, initial encounter: Secondary | ICD-10-CM

## 2013-06-15 DIAGNOSIS — S0993XA Unspecified injury of face, initial encounter: Secondary | ICD-10-CM

## 2013-06-15 DIAGNOSIS — Z8739 Personal history of other diseases of the musculoskeletal system and connective tissue: Secondary | ICD-10-CM | POA: Insufficient documentation

## 2013-06-15 DIAGNOSIS — S3991XA Unspecified injury of abdomen, initial encounter: Secondary | ICD-10-CM

## 2013-06-15 DIAGNOSIS — Y9389 Activity, other specified: Secondary | ICD-10-CM | POA: Insufficient documentation

## 2013-06-15 DIAGNOSIS — S4991XA Unspecified injury of right shoulder and upper arm, initial encounter: Secondary | ICD-10-CM

## 2013-06-15 DIAGNOSIS — S3690XA Unspecified injury of unspecified intra-abdominal organ, initial encounter: Secondary | ICD-10-CM

## 2013-06-15 DIAGNOSIS — S4980XA Other specified injuries of shoulder and upper arm, unspecified arm, initial encounter: Secondary | ICD-10-CM | POA: Insufficient documentation

## 2013-06-15 DIAGNOSIS — I10 Essential (primary) hypertension: Secondary | ICD-10-CM | POA: Insufficient documentation

## 2013-06-15 DIAGNOSIS — Z8669 Personal history of other diseases of the nervous system and sense organs: Secondary | ICD-10-CM | POA: Insufficient documentation

## 2013-06-15 DIAGNOSIS — Z7982 Long term (current) use of aspirin: Secondary | ICD-10-CM | POA: Insufficient documentation

## 2013-06-15 DIAGNOSIS — Z79899 Other long term (current) drug therapy: Secondary | ICD-10-CM | POA: Insufficient documentation

## 2013-06-15 DIAGNOSIS — S199XXA Unspecified injury of neck, initial encounter: Secondary | ICD-10-CM

## 2013-06-15 DIAGNOSIS — S301XXA Contusion of abdominal wall, initial encounter: Secondary | ICD-10-CM | POA: Insufficient documentation

## 2013-06-15 DIAGNOSIS — E119 Type 2 diabetes mellitus without complications: Secondary | ICD-10-CM | POA: Insufficient documentation

## 2013-06-15 DIAGNOSIS — Y92410 Unspecified street and highway as the place of occurrence of the external cause: Secondary | ICD-10-CM | POA: Insufficient documentation

## 2013-06-15 DIAGNOSIS — Z87891 Personal history of nicotine dependence: Secondary | ICD-10-CM | POA: Insufficient documentation

## 2013-06-15 HISTORY — DX: Essential (primary) hypertension: I10

## 2013-06-15 LAB — CBC WITH DIFFERENTIAL/PLATELET
BASOS ABS: 0 10*3/uL (ref 0.0–0.1)
BASOS PCT: 0 % (ref 0–1)
Eosinophils Absolute: 0.3 10*3/uL (ref 0.0–0.7)
Eosinophils Relative: 3 % (ref 0–5)
HCT: 37.3 % (ref 36.0–46.0)
HEMOGLOBIN: 12.7 g/dL (ref 12.0–15.0)
Lymphocytes Relative: 38 % (ref 12–46)
Lymphs Abs: 4 10*3/uL (ref 0.7–4.0)
MCH: 29.1 pg (ref 26.0–34.0)
MCHC: 34 g/dL (ref 30.0–36.0)
MCV: 85.4 fL (ref 78.0–100.0)
Monocytes Absolute: 0.8 10*3/uL (ref 0.1–1.0)
Monocytes Relative: 8 % (ref 3–12)
NEUTROS ABS: 5.4 10*3/uL (ref 1.7–7.7)
NEUTROS PCT: 51 % (ref 43–77)
PLATELETS: 346 10*3/uL (ref 150–400)
RBC: 4.37 MIL/uL (ref 3.87–5.11)
RDW: 12.9 % (ref 11.5–15.5)
WBC: 10.5 10*3/uL (ref 4.0–10.5)

## 2013-06-15 LAB — COMPREHENSIVE METABOLIC PANEL
ALBUMIN: 4.1 g/dL (ref 3.5–5.2)
ALK PHOS: 93 U/L (ref 39–117)
ALT: 12 U/L (ref 0–35)
AST: 18 U/L (ref 0–37)
BILIRUBIN TOTAL: 0.5 mg/dL (ref 0.3–1.2)
BUN: 19 mg/dL (ref 6–23)
CHLORIDE: 96 meq/L (ref 96–112)
CO2: 28 mEq/L (ref 19–32)
Calcium: 9.8 mg/dL (ref 8.4–10.5)
Creatinine, Ser: 0.9 mg/dL (ref 0.50–1.10)
GFR calc Af Amer: 76 mL/min — ABNORMAL LOW (ref >90)
GFR calc non Af Amer: 65 mL/min — ABNORMAL LOW (ref >90)
Glucose, Bld: 105 mg/dL — ABNORMAL HIGH (ref 70–99)
POTASSIUM: 3.7 meq/L (ref 3.7–5.3)
SODIUM: 137 meq/L (ref 137–147)
TOTAL PROTEIN: 8.2 g/dL (ref 6.0–8.3)

## 2013-06-15 MED ORDER — HYDROCODONE-ACETAMINOPHEN 5-325 MG PO TABS
1.0000 | ORAL_TABLET | Freq: Once | ORAL | Status: AC
Start: 2013-06-16 — End: 2013-06-15
  Administered 2013-06-15: 1 via ORAL
  Filled 2013-06-15: qty 1

## 2013-06-15 NOTE — Discharge Instructions (Signed)
We have determined that your problem requires further evaluation in the emergency department.  We will take care of your transport there.  Once at the emergency department, you will be evaluated by a provider and they will order whatever treatment or tests they deem necessary.  We cannot guarantee that they will do any specific test or do any specific treatment.  ° °
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
## 2013-06-16 LAB — URINALYSIS, ROUTINE W REFLEX MICROSCOPIC
Glucose, UA: NEGATIVE mg/dL
Ketones, ur: 15 mg/dL — AB
Leukocytes, UA: NEGATIVE
Nitrite: NEGATIVE
PH: 5 (ref 5.0–8.0)
Protein, ur: NEGATIVE mg/dL
Specific Gravity, Urine: 1.033 — ABNORMAL HIGH (ref 1.005–1.030)
Urobilinogen, UA: 0.2 mg/dL (ref 0.0–1.0)

## 2013-06-16 LAB — URINE MICROSCOPIC-ADD ON

## 2013-06-16 MED ORDER — HYDROCODONE-ACETAMINOPHEN 5-325 MG PO TABS: 1.0000 | ORAL_TABLET | Freq: Four times a day (QID) | ORAL | Status: DC | PRN

## 2013-06-16 NOTE — Discharge Instructions (Signed)
Contusion A contusion is a deep bruise. Contusions happen when an injury causes bleeding under the skin. Signs of bruising include pain, puffiness (swelling), and discolored skin. The contusion may turn blue, purple, or yellow. HOME CARE   Put ice on the injured area.  Put ice in a plastic bag.  Place a towel between your skin and the bag.  Leave the ice on for 15-20 minutes, 03-04 times a day.  Only take medicine as told by your doctor.  Rest the injured area.  If possible, raise (elevate) the injured area to lessen puffiness. GET HELP RIGHT AWAY IF:   You have more bruising or puffiness.  You have pain that is getting worse.  Your puffiness or pain is not helped by medicine. MAKE SURE YOU:   Understand these instructions.  Will watch your condition.  Will get help right away if you are not doing well or get worse. Document Released: 09/15/2007 Document Revised: 06/21/2011 Document Reviewed: 02/01/2011 [facility] Patient Information 2014 [place], [place]. If you develop new or worsening symptoms such as blood in your stool, visible blood in your urine, worsening pain, dizziness nausea and vomiting Please return for further evaluation

## 2013-06-17 NOTE — ED Provider Notes (Signed)
Medical screening examination/treatment/procedure(s) were performed by non-physician practitioner and as supervising physician I was immediately available for consultation/collaboration.   Teressa Lower, MD 06/17/13 [phone]

## 2013-06-18 ENCOUNTER — Telehealth: Payer: Self-pay | Admitting: Family Medicine

## 2013-06-18 MED ORDER — ENALAPRIL MALEATE 10 MG PO TABS
10.0000 mg | ORAL_TABLET | Freq: Every day | ORAL | Status: DC
Start: 2013-06-18 — End: 2013-10-30

## 2013-06-18 MED ORDER — RANITIDINE HCL 150 MG PO TABS
150.0000 mg | ORAL_TABLET | Freq: Two times a day (BID) | ORAL | Status: DC
Start: 2013-06-18 — End: 2014-06-04

## 2013-06-18 MED ORDER — HYDROCHLOROTHIAZIDE 12.5 MG PO CAPS: 12.5000 mg | ORAL_CAPSULE | Freq: Every day | ORAL | Status: DC

## 2013-06-18 NOTE — Telephone Encounter (Signed)
To RN staff - please call patient to let her know that her prescriptions have been sent to the pharmacy. Thanks.

## 2013-06-18 NOTE — Telephone Encounter (Signed)
Pt called and needs refills on the following medications. Hydrochlorothiazide, Enalapril, and Ranitidine called in. jw

## 2013-06-26 ENCOUNTER — Ambulatory Visit: Payer: Medicare Other | Admitting: Family Medicine

## 2013-06-28 ENCOUNTER — Ambulatory Visit (INDEPENDENT_AMBULATORY_CARE_PROVIDER_SITE_OTHER): Payer: Medicare HMO | Admitting: Family Medicine

## 2013-06-28 ENCOUNTER — Encounter: Payer: Self-pay | Admitting: Family Medicine

## 2013-06-28 VITALS — BP 128/68 | HR 80 | Ht 60.0 in | Wt 187.9 lb

## 2013-06-28 DIAGNOSIS — M25519 Pain in unspecified shoulder: Secondary | ICD-10-CM

## 2013-06-28 DIAGNOSIS — M25511 Pain in right shoulder: Secondary | ICD-10-CM

## 2013-06-28 DIAGNOSIS — M7501 Adhesive capsulitis of right shoulder: Secondary | ICD-10-CM

## 2013-06-28 DIAGNOSIS — M7500 Adhesive capsulitis of unspecified shoulder: Secondary | ICD-10-CM

## 2013-06-28 MED ORDER — METHYLPREDNISOLONE ACETATE 40 MG/ML IJ SUSP
40.0000 mg | Freq: Once | INTRAMUSCULAR | Status: AC
  Administered 2013-06-28: 40 mg via INTRA_ARTICULAR

## 2013-06-28 MED ORDER — MELOXICAM 7.5 MG PO TABS: 7.5000 mg | ORAL_TABLET | Freq: Every day | ORAL | Status: DC
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
## 2013-06-29 ENCOUNTER — Ambulatory Visit: Payer: Medicare Other | Admitting: Family Medicine

## 2013-07-10 ENCOUNTER — Ambulatory Visit: Payer: Medicare HMO | Admitting: Physical Therapy

## 2013-07-12 ENCOUNTER — Ambulatory Visit: Payer: Medicare HMO | Admitting: Physical Therapy

## 2013-09-07 ENCOUNTER — Other Ambulatory Visit: Payer: Self-pay | Admitting: Family Medicine

## 2013-10-23 ENCOUNTER — Ambulatory Visit: Payer: Medicare HMO | Admitting: Family Medicine

## 2013-10-30 ENCOUNTER — Encounter: Payer: Self-pay | Admitting: Family Medicine

## 2013-10-30 ENCOUNTER — Telehealth: Payer: Self-pay | Admitting: Family Medicine

## 2013-10-30 ENCOUNTER — Ambulatory Visit (INDEPENDENT_AMBULATORY_CARE_PROVIDER_SITE_OTHER): Payer: Medicare HMO | Admitting: Family Medicine

## 2013-10-30 VITALS — BP 107/74 | HR 88 | Temp 98.7°F | Ht 60.0 in | Wt 183.5 lb

## 2013-10-30 DIAGNOSIS — E119 Type 2 diabetes mellitus without complications: Secondary | ICD-10-CM

## 2013-10-30 DIAGNOSIS — I10 Essential (primary) hypertension: Secondary | ICD-10-CM

## 2013-10-30 DIAGNOSIS — E669 Obesity, unspecified: Secondary | ICD-10-CM

## 2013-10-30 DIAGNOSIS — E785 Hyperlipidemia, unspecified: Secondary | ICD-10-CM

## 2013-10-30 LAB — POCT GLYCOSYLATED HEMOGLOBIN (HGB A1C): HEMOGLOBIN A1C: 6.1

## 2013-10-30 MED ORDER — ENALAPRIL MALEATE 10 MG PO TABS: 10.0000 mg | ORAL_TABLET | Freq: Every day | ORAL | Status: DC

## 2013-10-30 MED ORDER — ATORVASTATIN CALCIUM 20 MG PO TABS: 20.0000 mg | ORAL_TABLET | Freq: Every day | ORAL | Status: DC

## 2013-10-30 NOTE — Patient Instructions (Addendum)
High Cholesterol - stop Simvastatin, start Lipitor 20 mg daily  High blood pressure - low today, stop HCTZ, continue Enalapril  Diabetes - continue current medications, Dr. Ree Kida will call you with your Hemoglobin A1C results, please continue to get yearly eye exams  Please make a lab appointment to have your cholesterol checked when you are fasting.   Return in 6 months.

## 2013-10-30 NOTE — Assessment & Plan Note (Signed)
Due to Lipid Profile. Taking Simvastatin (previously switched to Lipitor) -patient told to switch to lipitor

## 2013-10-30 NOTE — Assessment & Plan Note (Signed)
Blood pressure well controlled. -discontinue HCTZ as blood pressures on the soft side (asymptomatic)

## 2013-10-30 NOTE — Assessment & Plan Note (Signed)
Controlled. A1C 6.1. -continue metformin -on ACE-I for renal protection -up to date on dilated eye exam, foot exam wnl today

## 2013-10-30 NOTE — Telephone Encounter (Signed)
Discussed A1C with patient. No medication changes made.

## 2013-10-30 NOTE — Progress Notes (Signed)
   Subjective:    Patient ID: Desiree Parker, female    DOB: 20-Aug-1945, 68 y.o.   MRN: [id number]  HPI 68 y/o female presents for routine medical follow up.  Diabetes - currently on Metformin 500 mg daily, reports compliance, no side effects, does not check blood sugars, no numbness/tingling in feet, no regular exercise, follows with ophthalmology (last exam 07/2013)  HTN - currently on Enalapril and HCTZ, no lightheadedness, no vision changes, no headances  HLD - currently taking Simvastatin, previously switched to Lipitor given ASCVD score and diabetes, patient will to attempt switch to Lipitor  Social - former smoker   Review of Systems  Constitutional: Negative for fever, chills and fatigue.  Respiratory: Negative for cough and shortness of breath.   Cardiovascular: Negative for chest pain.  Gastrointestinal: Negative for nausea, vomiting and diarrhea.       Objective:   Physical Exam Vitals: reviewed Gen: pleasant AAF, NAD HEENT: normocephalic, PERRL, alternating exotropia, EOMI, no scleral icterus, MMM, uvula midline Cardiac: RRR, S1 snd S2 present, no murmurs, no heaves/thrills Resp: CTAB, normal effort Ext: no edema, bilateral feet without ulceration  A1C 6.1     Assessment & Plan:  Please see problem specific assessment and plan.

## 2013-11-09 ENCOUNTER — Encounter (HOSPITAL_COMMUNITY): Payer: Self-pay | Admitting: Emergency Medicine

## 2013-11-09 ENCOUNTER — Emergency Department (HOSPITAL_COMMUNITY)
Admission: EM | Admit: 2013-11-09 | Discharge: 2013-11-10 | Disposition: A | Discharge status: Home / Self Care | Payer: Medicare HMO | Attending: Emergency Medicine | Admitting: Emergency Medicine

## 2013-11-09 DIAGNOSIS — Y939 Activity, unspecified: Secondary | ICD-10-CM | POA: Insufficient documentation

## 2013-11-09 DIAGNOSIS — I10 Essential (primary) hypertension: Secondary | ICD-10-CM | POA: Insufficient documentation

## 2013-11-09 DIAGNOSIS — Z8669 Personal history of other diseases of the nervous system and sense organs: Secondary | ICD-10-CM | POA: Diagnosis not present

## 2013-11-09 DIAGNOSIS — T63441A Toxic effect of venom of bees, accidental (unintentional), initial encounter: Secondary | ICD-10-CM

## 2013-11-09 DIAGNOSIS — T6391XA Toxic effect of contact with unspecified venomous animal, accidental (unintentional), initial encounter: Secondary | ICD-10-CM | POA: Insufficient documentation

## 2013-11-09 DIAGNOSIS — E119 Type 2 diabetes mellitus without complications: Secondary | ICD-10-CM | POA: Diagnosis not present

## 2013-11-09 DIAGNOSIS — Y9289 Other specified places as the place of occurrence of the external cause: Secondary | ICD-10-CM | POA: Insufficient documentation

## 2013-11-09 DIAGNOSIS — Z7982 Long term (current) use of aspirin: Secondary | ICD-10-CM | POA: Insufficient documentation

## 2013-11-09 DIAGNOSIS — Z79899 Other long term (current) drug therapy: Secondary | ICD-10-CM | POA: Insufficient documentation

## 2013-11-09 DIAGNOSIS — L509 Urticaria, unspecified: Secondary | ICD-10-CM | POA: Insufficient documentation

## 2013-11-09 DIAGNOSIS — Z87891 Personal history of nicotine dependence: Secondary | ICD-10-CM | POA: Insufficient documentation

## 2013-11-09 DIAGNOSIS — IMO0002 Reserved for concepts with insufficient information to code with codable children: Secondary | ICD-10-CM | POA: Diagnosis not present

## 2013-11-09 DIAGNOSIS — T63461A Toxic effect of venom of wasps, accidental (unintentional), initial encounter: Secondary | ICD-10-CM | POA: Diagnosis not present

## 2013-11-09 MED ORDER — PREDNISONE 10 MG PO TABS: 20.0000 mg | ORAL_TABLET | Freq: Two times a day (BID) | ORAL | Status: DC

## 2013-11-09 MED ORDER — METHYLPREDNISOLONE SODIUM SUCC 125 MG IJ SOLR
125.0000 mg | Freq: Once | INTRAMUSCULAR | Status: AC
  Administered 2013-11-09: 125 mg via INTRAVENOUS
  Filled 2013-11-09: qty 2

## 2013-11-09 NOTE — Discharge Instructions (Signed)
Bee, Wasp, or Hornet Sting Your caregiver has diagnosed you as having an insect sting. An insect sting appears as a red lump in the skin that sometimes has a tiny hole in the center, or it may have a stinger in the center of the wound. The most common stings are from wasps, hornets and bees. Individuals have different reactions to insect stings.  A normal reaction may cause pain, swelling, and redness around the sting site.  A localized allergic reaction may cause swelling and redness that extends beyond the sting site.  A large local reaction may continue to develop over the next 12 to 36 hours.  On occasion, the reactions can be severe (anaphylactic reaction). An anaphylactic reaction may cause wheezing; difficulty breathing; chest pain; fainting; raised, itchy, red patches on the skin; a sick feeling to your stomach (nausea); vomiting; cramping; or diarrhea. If you have had an anaphylactic reaction to an insect sting in the past, you are more likely to have one again. HOME CARE INSTRUCTIONS   With bee stings, a small sac of poison is left in the wound. Brushing across this with something such as a credit card, or anything similar, will help remove this and decrease the amount of the reaction. This same procedure will not help a wasp sting as they do not leave behind a stinger and poison sac.  Apply a cold compress for 10 to 20 minutes every hour for 1 to 2 days, depending on severity, to reduce swelling and itching.  To lessen pain, a paste made of water and baking soda may be rubbed on the bite or sting and left on for 5 minutes.  To relieve itching and swelling, you may use take medication or apply medicated creams or lotions as directed.  Only take over-the-counter or prescription medicines for pain, discomfort, or fever as directed by your caregiver.  Wash the sting site daily with soap and water. Apply antibiotic ointment on the sting site as directed.  If you suffered a severe  reaction:  If you did not require hospitalization, an adult will need to stay with you for 24 hours in case the symptoms return.  You may need to wear a medical bracelet or necklace stating the allergy.  You and your family need to learn when and how to use an anaphylaxis kit or epinephrine injection.  If you have had a severe reaction before, always carry your anaphylaxis kit with you. SEEK MEDICAL CARE IF:   None of the above helps within 2 to 3 days.  The area becomes red, warm, tender, and swollen beyond the area of the bite or sting.  You have an oral temperature above 102 F (38.9 C). SEEK IMMEDIATE MEDICAL CARE IF:  You have symptoms of an allergic reaction which are:  Wheezing.  Difficulty breathing.  Chest pain.  Lightheadedness or fainting.  Itchy, raised, red patches on the skin.  Nausea, vomiting, cramping or diarrhea. ANY OF THESE SYMPTOMS MAY REPRESENT A SERIOUS PROBLEM THAT IS AN EMERGENCY. Do not wait to see if the symptoms will go away. Get medical help right away. Call your local emergency services (911 in [place] DO NOT drive yourself to the hospital. MAKE SURE YOU:   Understand these instructions.  Will watch your condition.  Will get help right away if you are not doing well or get worse. Document Released: 03/29/2005 Document Revised: 06/21/2011 Document Reviewed: 09/13/2009 [facility] Patient Information 2015 [place], [place]. This information is not intended to replace advice given  to you by your health care provider. Make sure you discuss any questions you have with your health care provider. Hives Hives are itchy, red, puffy (swollen) areas of the skin. Hives can change in size and location on your body. Hives can come and go for hours, days, or weeks. Hives do not spread from person to person (noncontagious). Scratching, exercise, and stress can make your hives worse. HOME CARE  Avoid things that cause your hives (triggers).  Take antihistamine  medicines as told by your doctor. Do not drive while taking an antihistamine.  Take any other medicines for itching as told by your doctor.  Wear loose-fitting clothing.  Keep all doctor visits as told. GET HELP RIGHT AWAY IF:   You have a fever.  Your tongue or lips are puffy.  You have trouble breathing or swallowing.  You feel tightness in the throat or chest.  You have belly (abdominal) pain.  You have lasting or severe itching that is not helped by medicine.  You have painful or puffy joints. These problems may be the first sign of a life-threatening allergic reaction. Call your local emergency services (911 in [place] MAKE SURE YOU:   Understand these instructions.  Will watch your condition.  Will get help right away if you are not doing well or get worse. Document Released: 01/06/2008 Document Revised: 09/28/2011 Document Reviewed: 06/22/2011 [facility] Patient Information 2015 [place], [place]. This information is not intended to replace advice given to you by your health care provider. Make sure you discuss any questions you have with your health care provider.

## 2013-11-09 NOTE — ED Notes (Signed)
Patient was outside and stung on the left side of her head.  No difficulty breathing, hands red and itching.  Whelps noticed on her arms

## 2013-11-09 NOTE — ED Notes (Signed)
Patient stung by an insect to the left side of her head  Whelps noticed to her arms, hands red, swollen and itching.  No difficulty breathing  EMS adm Benadryl 50mg  PO

## 2013-11-09 NOTE — ED Provider Notes (Signed)
CSN: [id number]     Arrival date & time 11/09/13  2036 History   First MD Initiated Contact with Patient 11/09/13 2104     Chief Complaint  Patient presents with  . Insect Bite     (Consider location/radiation/quality/duration/timing/severity/associated sxs/prior Treatment) HPI 68 y.o female stung by bee one hour prior to arrival to left side of face.  She has some rashes and itching to bilateral arms.  She has no previous history of severe allergic reaction to stings.  She denies oral swelling, dyspnea, or lightheadedness.  She was given po benadryl pta.  Past Medical History  Diagnosis Date  . Alternating exotropia   . 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 Diabetes mellitus without complication   . Hypertension    Past Surgical History  Procedure Laterality Date  . Tubal ligation  1975  . Cesarean section  1975  . Cholecystectomy  1975    states gallstones removed   Family History  Problem Relation Age of Onset  . Hypertension Mother   . Hypertension Sister   . Cancer Mother     lung  . Diabetes Mother   . Diabetes Father   . Asthma Son    History  Substance Use Topics  . Smoking status: Former Smoker -- 2.00 packs/day for 27 years    Types: Cigarettes    Quit date: 09/24/1988  . Smokeless tobacco: Current User    Types: Snuff  . Alcohol Use: 1.0 oz/week    2 drink(s) per week   OB History   Grav Para Term Preterm Abortions TAB SAB Ect Mult Living                 Review of Systems  All other systems reviewed and are negative.     Allergies  Review of patient's allergies indicates no known allergies.  Home Medications   Prior to Admission medications   Medication Sig Start Date End Date Taking? Authorizing Provider  aspirin 81 MG EC tablet Take 81 mg by mouth daily.     Yes Historical Provider, MD  atorvastatin (LIPITOR) 20 MG tablet Take 1 tablet (20 mg total) by  mouth daily. 10/30/13  Yes Lupita Dawn, MD  enalapril (VASOTEC) 10 MG tablet Take 1 tablet (10 mg total) by mouth daily. 10/30/13  Yes Lupita Dawn, MD  metFORMIN (GLUCOPHAGE) 1000 MG tablet Take 0.5 tablets (500 mg total) by mouth 2 (two) times daily with a meal. 02/05/13  Yes Lupita Dawn, MD  ranitidine (ZANTAC) 150 MG tablet Take 1 tablet (150 mg total) by mouth 2 (two) times daily. 06/18/13  Yes Lupita Dawn, MD  albuterol (PROVENTIL HFA;VENTOLIN HFA) 108 (90 BASE) MCG/ACT inhaler Inhale 2 puffs into the lungs every 6 (six) hours as needed for wheezing.    Historical Provider, MD   BP 107/59  Pulse 83  Temp(Src) 98.7 F (37.1 C) (Oral)  Resp 32  Ht 5' (1.524 m)  Wt 183 lb (83.008 kg)  BMI 35.74 kg/m2  SpO2 98% Physical Exam  Nursing note and vitals reviewed. Constitutional: She is oriented to person, place, and time. She appears well-developed and well-nourished.  HENT:  Head: Normocephalic and atraumatic.  Right Ear: External ear normal.  Left Ear: External ear normal.  Nose: Nose normal.  Mouth/Throat: Oropharynx is clear and moist.  Eyes: Conjunctivae and EOM are normal. Pupils are equal, round, and reactive  to light.  Neck: Normal range of motion. Neck supple.  Cardiovascular: Normal rate, regular rhythm, normal heart sounds and intact distal pulses.   Pulmonary/Chest: Effort normal and breath sounds normal.  Abdominal: Soft. Bowel sounds are normal.  Musculoskeletal: Normal range of motion.  Neurological: She is alert and oriented to person, place, and time. She has normal reflexes. She displays normal reflexes. No cranial nerve deficit. Coordination normal.  Skin: Skin is warm and dry. There is erythema.  Rash c.w hives bilateral upper arms.    Psychiatric: She has a normal mood and affect. Her behavior is normal. Judgment and thought content normal.    ED Course  Procedures (including critical care time) Labs Review Labs Reviewed - No data to display  Imaging  Review No results found.   EKG Interpretation None      MDM   Final diagnoses:  Bee sting, accidental or unintentional, initial encounter  Hives    Patient treated here with prednisone and observed for 2.5 hours.  Rash resolved and patient feels improved. Plan short course prednisone and continue benadryl.   Discussed return precautions and follow up.  Patient voices understanding.      Shaune Pollack, MD 11/10/13 [phone]

## 2013-11-09 NOTE — ED Notes (Addendum)
Wrong pt

## 2013-12-10 ENCOUNTER — Other Ambulatory Visit: Payer: Self-pay | Admitting: *Deleted

## 2013-12-11 MED ORDER — METFORMIN HCL 1000 MG PO TABS: 500.0000 mg | ORAL_TABLET | Freq: Two times a day (BID) | ORAL | Status: DC

## 2014-02-19 ENCOUNTER — Other Ambulatory Visit: Payer: Self-pay | Admitting: Family Medicine

## 2014-03-11 ENCOUNTER — Other Ambulatory Visit: Payer: Self-pay | Admitting: *Deleted

## 2014-03-11 MED ORDER — ALBUTEROL SULFATE HFA 108 (90 BASE) MCG/ACT IN AERS: 2.0000 | INHALATION_SPRAY | Freq: Four times a day (QID) | RESPIRATORY_TRACT | Status: DC | PRN

## 2014-04-19 ENCOUNTER — Other Ambulatory Visit: Payer: Self-pay | Admitting: Family Medicine

## 2014-06-04 ENCOUNTER — Encounter: Payer: Self-pay | Admitting: Family Medicine

## 2014-06-04 ENCOUNTER — Ambulatory Visit (INDEPENDENT_AMBULATORY_CARE_PROVIDER_SITE_OTHER): Payer: Medicare HMO | Admitting: Family Medicine

## 2014-06-04 VITALS — BP 142/84 | HR 80 | Temp 98.3°F | Ht 60.0 in | Wt 199.0 lb

## 2014-06-04 DIAGNOSIS — K219 Gastro-esophageal reflux disease without esophagitis: Secondary | ICD-10-CM

## 2014-06-04 DIAGNOSIS — E669 Obesity, unspecified: Secondary | ICD-10-CM

## 2014-06-04 DIAGNOSIS — I10 Essential (primary) hypertension: Secondary | ICD-10-CM

## 2014-06-04 DIAGNOSIS — J449 Chronic obstructive pulmonary disease, unspecified: Secondary | ICD-10-CM

## 2014-06-04 DIAGNOSIS — E119 Type 2 diabetes mellitus without complications: Secondary | ICD-10-CM

## 2014-06-04 LAB — POCT GLYCOSYLATED HEMOGLOBIN (HGB A1C): HEMOGLOBIN A1C: 6

## 2014-06-04 MED ORDER — RANITIDINE HCL 150 MG PO TABS: 150.0000 mg | ORAL_TABLET | Freq: Two times a day (BID) | ORAL | Status: DC

## 2014-06-04 NOTE — Assessment & Plan Note (Signed)
Blood pressure at goal per JNC 8 guidelines -Continue enalapril 10 mg daily

## 2014-06-04 NOTE — Assessment & Plan Note (Signed)
Symptoms controlled with when necessary albuterol

## 2014-06-04 NOTE — Assessment & Plan Note (Signed)
Discussed lifestyle modifications.

## 2014-06-04 NOTE — Assessment & Plan Note (Signed)
Type 2 diabetes well-controlled. -Patient counseled that she could likely discontinue metformin however she was resistant, as not having any hypoglycemic episodes we'll continue current therapy -Up-to-date on eye exams, foot exam completed today -Patient counseled to continue lifestyle modifications

## 2014-06-04 NOTE — Patient Instructions (Addendum)
Breathing - continue albuterol prn  Diabetes - continue Metformin, however you really do not need this medication, if you would like to stop please call Dr. Ree Kida  Blood Pressure - controlled, continue current regimen  Acid Reflux - continue Ranitidine, call Dr. Ree Kida if you would like a stronger medication.   Goal weight of 188 lbs when I see you in 6 months.

## 2014-06-04 NOTE — Assessment & Plan Note (Signed)
Acid reflux symptoms mostly controlled with daily ranitidine. Did discuss starting PPI however patient elected to continue current regimen

## 2014-06-04 NOTE — Progress Notes (Signed)
   Subjective:    Patient ID: Desiree Parker, female    DOB: Aug 28, 1945, 69 y.o.   MRN: [id number]  HPI 69 year old female presents for routine follow-up.  Hypertension-patient currently on enalapril 10 mg daily, no side effects, no chest pain, no shortness of breath, no vision changes, no headaches  Non-insulin-dependent type 2 diabetes-currently on metformin 500 mg daily, currently prescribed as twice daily, tolerating well, no GI side effects, has had an eye exam within the past year, denies foot pain or neuropathic symptoms, patient reports that she is attempting to improve her diet including eating more salads and fish instead of red meat, she does not adhere to a regular exercise routine  Acid reflux-currently on ranitidine 150 mg twice daily, reports occasional acid reflux symptoms, avoids spicy foods  History of COPD bronchitis-occasional shortness of breath that is controlled with when necessary albuterol  Social-former smoker   Review of Systems  Constitutional: Negative for fever, chills and fatigue.  Respiratory: Negative for cough and shortness of breath.   Cardiovascular: Negative for chest pain and leg swelling.  Gastrointestinal: Negative for nausea, vomiting and diarrhea.       Objective:   Physical Exam Vitals: Reviewed  General: Pleasant African-American female, no acute distress HEENT: Normocephalic, pupils equal round and reactive to light, extraocular motion intact, no scleral icterus, moist mucous members, uvula midline, neck was supple, no anterior posterior cervical lymphadenopathy Cardiac: Regular rate and rhythm, S1 and S2 present, no murmurs, no heaves or thrills Respiratory: Clear to station bilaterally, normal effort Abdomen: Soft, nontender, bowel sounds present Extremities: Trace edema, see quality metrics for foot examination  Point-of-care A1c 6.0    Assessment & Plan:  Please see problem specific assessment and plan.

## 2014-08-30 ENCOUNTER — Other Ambulatory Visit: Payer: Self-pay | Admitting: Family Medicine

## 2014-11-05 ENCOUNTER — Other Ambulatory Visit: Payer: Self-pay | Admitting: Family Medicine

## 2014-11-28 ENCOUNTER — Telehealth: Payer: Self-pay | Admitting: Family Medicine

## 2014-11-28 NOTE — Telephone Encounter (Signed)
Desiree Parker from [facility] left message on medical records line that they are needing a verbal order for 5% lanacane.  Please call and use ID [phone]

## 2014-11-29 NOTE — Telephone Encounter (Signed)
Spoke to patient. She states that a representative from [facility] spoke to her about using Lanacane cream for her joint aches/pains. Patient reports chronic knee pain, she has been attempting to walk to lose weight but is limited by knee pain, she would like to attempt a trial of Lanacane cream.  RN staff - please call in lanacane cream 5% to the pharmacy ([facility]), dispense 1 oz, refill #2, thanks

## 2014-11-29 NOTE — Telephone Encounter (Signed)
Called in rx at [facility] [place] [facility]. Shemuel Harkleroad Kennon Holter, CMA

## 2015-02-17 ENCOUNTER — Other Ambulatory Visit: Payer: Self-pay | Admitting: Family Medicine

## 2015-02-20 ENCOUNTER — Telehealth: Payer: Self-pay | Admitting: *Deleted

## 2015-02-20 NOTE — Telephone Encounter (Signed)
Received a refill request from [facility] for Lidocaine 5% ointment.  Medication is not listed on current med list.  Request form placed in provider box for review.  Derl Barrow, RN

## 2015-02-21 ENCOUNTER — Other Ambulatory Visit: Payer: Self-pay | Admitting: Family Medicine

## 2015-02-21 MED ORDER — LIDOCAINE 5 % EX OINT: 1.0000 "application " | TOPICAL_OINTMENT | CUTANEOUS | Status: DC | PRN

## 2015-02-21 NOTE — Telephone Encounter (Signed)
Prescription for lidocaine ointment faxed to external pharmacy.

## 2015-08-08 ENCOUNTER — Ambulatory Visit: Payer: Medicare HMO | Admitting: Family Medicine

## 2015-09-11 ENCOUNTER — Ambulatory Visit (INDEPENDENT_AMBULATORY_CARE_PROVIDER_SITE_OTHER): Payer: Medicare HMO | Admitting: Family Medicine

## 2015-09-11 ENCOUNTER — Encounter: Payer: Self-pay | Admitting: Family Medicine

## 2015-09-11 ENCOUNTER — Ambulatory Visit (HOSPITAL_COMMUNITY)
Admission: RE | Admit: 2015-09-11 | Discharge: 2015-09-11 | Disposition: A | Discharge status: Home / Self Care | Payer: Medicare HMO | Source: Ambulatory Visit | Attending: Family Medicine | Admitting: Family Medicine

## 2015-09-11 VITALS — BP 180/78 | HR 72 | Temp 97.8°F | Ht 60.0 in | Wt 206.0 lb

## 2015-09-11 DIAGNOSIS — R9431 Abnormal electrocardiogram [ECG] [EKG]: Secondary | ICD-10-CM | POA: Insufficient documentation

## 2015-09-11 DIAGNOSIS — Z91148 Patient's other noncompliance with medication regimen for other reason: Secondary | ICD-10-CM | POA: Insufficient documentation

## 2015-09-11 DIAGNOSIS — J45909 Unspecified asthma, uncomplicated: Secondary | ICD-10-CM

## 2015-09-11 DIAGNOSIS — E785 Hyperlipidemia, unspecified: Secondary | ICD-10-CM

## 2015-09-11 DIAGNOSIS — I10 Essential (primary) hypertension: Secondary | ICD-10-CM

## 2015-09-11 DIAGNOSIS — E119 Type 2 diabetes mellitus without complications: Secondary | ICD-10-CM | POA: Diagnosis not present

## 2015-09-11 DIAGNOSIS — Z9114 Patient's other noncompliance with medication regimen: Secondary | ICD-10-CM

## 2015-09-11 DIAGNOSIS — R079 Chest pain, unspecified: Secondary | ICD-10-CM | POA: Diagnosis not present

## 2015-09-11 DIAGNOSIS — I444 Left anterior fascicular block: Secondary | ICD-10-CM | POA: Insufficient documentation

## 2015-09-11 DIAGNOSIS — J449 Chronic obstructive pulmonary disease, unspecified: Secondary | ICD-10-CM

## 2015-09-11 DIAGNOSIS — K219 Gastro-esophageal reflux disease without esophagitis: Secondary | ICD-10-CM

## 2015-09-11 HISTORY — DX: Patient's other noncompliance with medication regimen: Z91.14

## 2015-09-11 HISTORY — DX: Chest pain, unspecified: R07.9

## 2015-09-11 HISTORY — DX: Patient's other noncompliance with medication regimen for other reason: Z91.148

## 2015-09-11 LAB — COMPLETE METABOLIC PANEL WITH GFR
ALT: 22 U/L (ref 6–29)
AST: 26 U/L (ref 10–35)
Albumin: 4.2 g/dL (ref 3.6–5.1)
Alkaline Phosphatase: 86 U/L (ref 33–130)
BILIRUBIN TOTAL: 0.5 mg/dL (ref 0.2–1.2)
BUN: 17 mg/dL (ref 7–25)
CHLORIDE: 104 mmol/L (ref 98–110)
CO2: 25 mmol/L (ref 20–31)
Calcium: 9.4 mg/dL (ref 8.6–10.4)
Creat: 0.78 mg/dL (ref 0.50–0.99)
GFR, EST NON AFRICAN AMERICAN: 78 mL/min (ref >=60)
Glucose, Bld: 130 mg/dL — ABNORMAL HIGH (ref 65–99)
Potassium: 4.2 mmol/L (ref 3.5–5.3)
Sodium: 140 mmol/L (ref 135–146)
Total Protein: 7.2 g/dL (ref 6.1–8.1)

## 2015-09-11 LAB — POCT GLYCOSYLATED HEMOGLOBIN (HGB A1C): Hemoglobin A1C: 6.6

## 2015-09-11 LAB — LIPID PANEL
CHOL/HDL RATIO: 3.7 ratio (ref <=5.0)
Cholesterol: 221 mg/dL — ABNORMAL HIGH (ref 125–200)
HDL: 59 mg/dL (ref >=46)
LDL Cholesterol: 143 mg/dL — ABNORMAL HIGH (ref <130)
TRIGLYCERIDES: 97 mg/dL (ref <150)
VLDL: 19 mg/dL (ref <30)

## 2015-09-11 LAB — CBC
HEMATOCRIT: 39 % (ref 35.0–45.0)
Hemoglobin: 13.3 g/dL (ref 11.7–15.5)
MCH: 29.6 pg (ref 27.0–33.0)
MCHC: 34.1 g/dL (ref 32.0–36.0)
MCV: 86.7 fL (ref 80.0–100.0)
MPV: 10.1 fL (ref 7.5–12.5)
PLATELETS: 296 10*3/uL (ref 140–400)
RBC: 4.5 MIL/uL (ref 3.80–5.10)
RDW: 13.9 % (ref 11.0–15.0)
WBC: 9 10*3/uL (ref 3.8–10.8)

## 2015-09-11 MED ORDER — ENALAPRIL MALEATE 10 MG PO TABS: 10.0000 mg | ORAL_TABLET | Freq: Every day | ORAL | Status: DC

## 2015-09-11 MED ORDER — ALBUTEROL SULFATE HFA 108 (90 BASE) MCG/ACT IN AERS: 2.0000 | INHALATION_SPRAY | Freq: Four times a day (QID) | RESPIRATORY_TRACT | Status: DC | PRN

## 2015-09-11 MED ORDER — ATORVASTATIN CALCIUM 20 MG PO TABS: 20.0000 mg | ORAL_TABLET | Freq: Every day | ORAL | Status: DC

## 2015-09-11 MED ORDER — OMEPRAZOLE 20 MG PO CPDR: 20.0000 mg | DELAYED_RELEASE_CAPSULE | Freq: Every day | ORAL | Status: DC

## 2015-09-11 NOTE — Progress Notes (Deleted)
Subjective:     Patient ID: Desiree Parker, female   DOB: 09-03-1945, 70 y.o.   MRN: [id number]  HPI Daughter in law says she and husband are unsure what mother is taking for diabetes. Information not complete from patient so daughter came to hear directly from doctor.   Patient says Dr. Evelina Bucy her everything. Only taking aspirin and Goody powder.   Diet: Mostly cooks at home but eats out at home. Eats salads, chicken, mashed potatoes, prime rib. During week , eats hamburger, hotdog, mostly meats and snacks. Eats a lot of ice cream, pickles, cereal, pringles, chips, sodas. She drinks tea and coke regularly.    Diabetes: Does not check blood sugars. Does not take medications.  Says she is out of test strips for machine. Not complaining of vision problems but does need glasses. No complaints of sensation problems in feet but does say she has cramping in feet.  No hospitalizations or altered mental state.   GERD: Has chest pain when eats too late. Has intermittent chest pain.   Abd pain: Pain sometimes front and back near belt line. Couple months. Intermittent.  Nagging pain. Not radiating. 7/10. Takes goody powder regularly to treat pain. Had been taking goody powder prior to the pain. She takes 3 times a day. Takes aspirin every day. No N/V. No blood.   SOB: has been going on for more than 6 months. Walking from parking lot to department store she is out of breath but denies chest pain. Recently, she has been taking inhaler. She takes her albuterol when she is out of breathe and says it helps. Progressively worsening. She is having trouble walking around grocery store.  Nightsweats: Has been going on for 2 years. Happens every day. Pillow is wet. Just head sweat, not body. No weight loss.     Tobacco: Wants to stop next month***    Not taking any medications.      Review of Systems     Objective:   Physical Exam     Assessment:     ***    Plan:     ***

## 2015-09-11 NOTE — Patient Instructions (Addendum)
It was nice to see you today.  Diabetes - in a good range, stop Metformin, please work on diet (cut down on snacks and soda), recheck Hemoglobin A1C in 3 months, please go back to your eye doctor for a yearly exam.   Hypertension - blood pressure elevated, restart Enalapril  Stomach pain - could be related to acid reflux, start Omeprazole   Stop Aspirin and Good Powder until I tell you to restart.   I will arrange for a nurse to come to your house to help with medications.   I have ordered an Ultrasound of your heart. My nurse will schedule.

## 2015-09-11 NOTE — Assessment & Plan Note (Signed)
May be contributing to dyspnea on exertion. -refill of albuterol provided -if echo unremarkable consider adding controller medication

## 2015-09-11 NOTE — Assessment & Plan Note (Signed)
Intermittent chest pain (more epigastric than substernal). Associated exertional dyspnea. EKG showed no acute ischemic changes.  -check Echocardiogram to evaluate heart structure

## 2015-09-11 NOTE — Assessment & Plan Note (Addendum)
Uncontrolled due to medication noncompliance. -refill of Enalapril provided -check basic labs

## 2015-09-11 NOTE — Assessment & Plan Note (Signed)
Noncompliance with medications. Patient denies depression. -will arrange home health RN to go to home and assist with medications

## 2015-09-11 NOTE — Assessment & Plan Note (Signed)
Due for recheck of lipid profile. -labs ordered -refill of Lipitor provided

## 2015-09-11 NOTE — Assessment & Plan Note (Signed)
Controlled despite medication noncompliance. -stop Metformin -encouraged healthy eating habits -encouraged yearly follow up with ophthalmology

## 2015-09-11 NOTE — Progress Notes (Signed)
   Subjective:    Patient ID: Desiree Parker, female    DOB: Aug 01, 1945, 70 y.o.   MRN: [id number]  HPI 70 y/o female presents for routine follow up. Last seen in office in 05/2014. Accompanied by daughter in law.   Non-Insulin Dependent Type 2 DM - prescribed Metformin 500 mg BID, due for foot and eye exam, does not check sugars, reports poor diet (snacks/soda) Diet: Mostly cooks at home but eats out at home. Eats salads, chicken, mashed potatoes, prime rib. During week , eats hamburger, hotdog, mostly meats and snacks. Eats a lot of ice cream, pickles, cereal, pringles, chips, sodas. She drinks tea and coke regularly.       HTN - prescribed Enalapril 10 mg daily, intermittent chest pain (present for one year, has once per week, mostly upper abdomen), sob at baseline and worse with exertion. No current chest pain. No swelling, no orthopnea, no PND  HLD - prescribed Lipitor 20 mg daily  COPD - uses albuterol prn (needs daily when ambulating)  Abd Pain - hx. Of GERD, not taking medications, reports a belt like pain around abdomen and to sides (1-2 times per week  HM - due for multiple exams (Mammogram, Dexa, Colonoscopy) Immunizations - due for Tdap/PNA(13-valent)   Has only been taking ASA and Goody Powder. Not taking any prescribed medicaitons.  Social - lives in senior living facility, does not attend meals, lives alone  Review of Systems  Constitutional: Negative for fever, chills and fatigue.  Respiratory: Positive for shortness of breath. Negative for choking.   Cardiovascular: Positive for chest pain. Negative for leg swelling.  Gastrointestinal: Positive for abdominal pain. Negative for nausea, vomiting and diarrhea.       Objective:   Physical Exam BP 180/78 mmHg  Pulse 72  Temp(Src) 97.8 F (36.6 C) (Oral)  Ht 5' (1.524 m)  Wt 206 lb (93.441 kg)  BMI 40.23 kg/m2  SpO2 98% Gen: pleasant female, NAD HEENT: normocephalic, MMM Cardiac: RRR, S1 and S2 present, no  murmur Resp: Diminished breath sounds in all lung fields, no increased work of breathing Ext: trace pedal edema   POC A1C 6.6 EKG: NSR, left axis deviated, T wave inversion V1 and V2 (unchanged from previous), LAF block.     Assessment & Plan:  HYPERTENSION, BENIGN SYSTEMIC Uncontrolled due to medication noncompliance. -refill of Enalapril provided -check basic labs  GASTROESOPHAGEAL REFLUX, NO ESOPHAGITIS Patient has abdominal pain consistent with GERD. -hold Aspirin and Goody powder -change Zantac to Omeprazole  Diabetes mellitus, type II Controlled despite medication noncompliance. -stop Metformin -encouraged healthy eating habits -encouraged yearly follow up with ophthalmology  Hyperlipidemia Due for recheck of lipid profile. -labs ordered -refill of Lipitor provided  Noncompliance with medications Noncompliance with medications. Patient denies depression. -will arrange home health RN to go to home and assist with medications  Chest pain Intermittent chest pain (more epigastric than substernal). Associated exertional dyspnea. EKG showed no acute ischemic changes.  -check Echocardiogram to evaluate heart structure  COPD with asthma May be contributing to dyspnea on exertion. -refill of albuterol provided -if echo unremarkable consider adding controller medication  HM not addressed due to multiple other acute issues.

## 2015-09-11 NOTE — Assessment & Plan Note (Signed)
Patient has abdominal pain consistent with GERD. -hold Aspirin and Goody powder -change Zantac to Omeprazole

## 2015-09-12 ENCOUNTER — Encounter: Payer: Self-pay | Admitting: Family Medicine

## 2015-09-12 LAB — HIV ANTIBODY (ROUTINE TESTING W REFLEX): HIV 1&2 Ab, 4th Generation: NONREACTIVE

## 2015-09-12 LAB — HEPATITIS C ANTIBODY: HCV AB: NEGATIVE

## 2015-09-18 ENCOUNTER — Ambulatory Visit (INDEPENDENT_AMBULATORY_CARE_PROVIDER_SITE_OTHER): Payer: Medicare HMO | Admitting: *Deleted

## 2015-09-18 VITALS — BP 150/70 | HR 76

## 2015-09-18 DIAGNOSIS — I10 Essential (primary) hypertension: Secondary | ICD-10-CM

## 2015-09-18 DIAGNOSIS — Z136 Encounter for screening for cardiovascular disorders: Secondary | ICD-10-CM

## 2015-09-18 DIAGNOSIS — Z0130 Encounter for examination of blood pressure without abnormal findings: Secondary | ICD-10-CM

## 2015-09-18 NOTE — Progress Notes (Signed)
   Patient presents for BP check after restarting enalapril 10 mg daily States taking enalapril every AM since last OV including today Discussed need for low sodium diet and using Mrs. Dash as alternative to salt Discussed walking 30 minutes per day for exercise in 10-15 minute increments as tolerated. Patient denies headaches, blurred vision, chest pain  Positive for [facility] which is chronic for patient and is  relieved with albuterol MDI Patient advised to call for med refills at least 7 days before running out so as not to go without.  Filed Vitals:   09/18/15 1017  BP: 150/70  Pulse: 76  SpO2      97%  Patient aware that she has a f/u with PCP on 10/03/15 at 1000 Patient given literature on DASH Eating Plan Discussed scheduling an Annual Wellness Visit. Patient states she will call back to schedule Patient given contact info for [facility] to schedule mammogram and Dexa scan Markanthony Gedney, Orvis Brill, RN

## 2015-09-18 NOTE — Patient Instructions (Signed)
DASH Eating Plan  DASH stands for "Dietary Approaches to Stop Hypertension." The DASH eating plan is a healthy eating plan that has been shown to reduce high blood pressure (hypertension). Additional health benefits may include reducing the risk of type 2 diabetes mellitus, heart disease, and stroke. The DASH eating plan may also help with weight loss.  WHAT DO I NEED TO KNOW ABOUT THE DASH EATING PLAN?  For the DASH eating plan, you will follow these general guidelines:  · Choose foods with a percent daily value for sodium of less than 5% (as listed on the food label).  · Use salt-free seasonings or herbs instead of table salt or sea salt.  · Check with your health care provider or pharmacist before using salt substitutes.  · Eat lower-sodium products, often labeled as "lower sodium" or "no salt added."  · Eat fresh foods.  · Eat more vegetables, fruits, and low-fat dairy products.  · Choose whole grains. Look for the word "whole" as the first word in the ingredient list.  · Choose fish and skinless chicken or turkey more often than red meat. Limit fish, poultry, and meat to 6 oz (170 g) each day.  · Limit sweets, desserts, sugars, and sugary drinks.  · Choose heart-healthy fats.  · Limit cheese to 1 oz (28 g) per day.  · Eat more home-cooked food and less restaurant, buffet, and fast food.  · Limit fried foods.  · Cook foods using methods other than frying.  · Limit canned vegetables. If you do use them, rinse them well to decrease the sodium.  · When eating at a restaurant, ask that your food be prepared with less salt, or no salt if possible.  WHAT FOODS CAN I EAT?  Seek help from a dietitian for individual calorie needs.  Grains  Whole grain or whole wheat bread. Brown rice. Whole grain or whole wheat pasta. Quinoa, bulgur, and whole grain cereals. Low-sodium cereals. Corn or whole wheat flour tortillas. Whole grain cornbread. Whole grain crackers. Low-sodium crackers.  Vegetables  Fresh or frozen vegetables  (raw, steamed, roasted, or grilled). Low-sodium or reduced-sodium tomato and vegetable juices. Low-sodium or reduced-sodium tomato sauce and paste. Low-sodium or reduced-sodium canned vegetables.   Fruits  All fresh, canned (in natural juice), or frozen fruits.  Meat and Other Protein Products  Ground beef (85% or leaner), grass-fed beef, or beef trimmed of fat. Skinless chicken or turkey. Ground chicken or turkey. Pork trimmed of fat. All fish and seafood. Eggs. Dried beans, peas, or lentils. Unsalted nuts and seeds. Unsalted canned beans.  Dairy  Low-fat dairy products, such as skim or 1% milk, 2% or reduced-fat cheeses, low-fat ricotta or cottage cheese, or plain low-fat yogurt. Low-sodium or reduced-sodium cheeses.  Fats and Oils  Tub margarines without trans fats. Light or reduced-fat mayonnaise and salad dressings (reduced sodium). Avocado. Safflower, olive, or canola oils. Natural peanut or almond butter.  Other  Unsalted popcorn and pretzels.  The items listed above may not be a complete list of recommended foods or beverages. Contact your dietitian for more options.  WHAT FOODS ARE NOT RECOMMENDED?  Grains  White bread. White pasta. White rice. Refined cornbread. Bagels and croissants. Crackers that contain trans fat.  Vegetables  Creamed or fried vegetables. Vegetables in a cheese sauce. Regular canned vegetables. Regular canned tomato sauce and paste. Regular tomato and vegetable juices.  Fruits  Dried fruits. Canned fruit in light or heavy syrup. Fruit juice.  Meat and Other Protein   Products  Fatty cuts of meat. Ribs, chicken wings, bacon, sausage, bologna, salami, chitterlings, fatback, hot dogs, bratwurst, and packaged luncheon meats. Salted nuts and seeds. Canned beans with salt.  Dairy  Whole or 2% milk, cream, half-and-half, and cream cheese. Whole-fat or sweetened yogurt. Full-fat cheeses or blue cheese. Nondairy creamers and whipped toppings. Processed cheese, cheese spreads, or cheese  curds.  Condiments  Onion and garlic salt, seasoned salt, table salt, and sea salt. Canned and packaged gravies. Worcestershire sauce. Tartar sauce. Barbecue sauce. Teriyaki sauce. Soy sauce, including reduced sodium. Steak sauce. Fish sauce. Oyster sauce. Cocktail sauce. Horseradish. Ketchup and mustard. Meat flavorings and tenderizers. Bouillon cubes. Hot sauce. Tabasco sauce. Marinades. Taco seasonings. Relishes.  Fats and Oils  Butter, stick margarine, lard, shortening, ghee, and bacon fat. Coconut, palm kernel, or palm oils. Regular salad dressings.  Other  Pickles and olives. Salted popcorn and pretzels.  The items listed above may not be a complete list of foods and beverages to avoid. Contact your dietitian for more information.  WHERE CAN I FIND MORE INFORMATION?  National Heart, Lung, and [employer] Institute: www.nhlbi.nih.gov/health/health-topics/topics/dash/     This information is not intended to replace advice given to you by your health care provider. Make sure you discuss any questions you have with your health care provider.     Document Released: 03/18/2011 Document Revised: 04/19/2014 Document Reviewed: 01/31/2013  Elsevier Interactive Patient Education ©2016 Elsevier Inc.

## 2015-09-19 ENCOUNTER — Telehealth: Payer: Self-pay | Admitting: Family Medicine

## 2015-09-19 DIAGNOSIS — E2839 Other primary ovarian failure: Secondary | ICD-10-CM

## 2015-09-19 DIAGNOSIS — Z0000 Encounter for general adult medical examination without abnormal findings: Secondary | ICD-10-CM

## 2015-09-19 NOTE — Telephone Encounter (Signed)
Pt called and needs a referral to go to the breast center for her mammogram. jw

## 2015-09-19 NOTE — Telephone Encounter (Signed)
Spoke with patient, she states she tried to call [facility] to schedule a screenign mammogram and bone density and was informed that MD will need to order these tests. Will forward to PCP.

## 2015-09-22 DIAGNOSIS — Z0000 Encounter for general adult medical examination without abnormal findings: Secondary | ICD-10-CM | POA: Insufficient documentation

## 2015-09-22 DIAGNOSIS — E2839 Other primary ovarian failure: Secondary | ICD-10-CM

## 2015-09-22 HISTORY — DX: Other primary ovarian failure: E28.39

## 2015-09-22 NOTE — Telephone Encounter (Signed)
RN staff - I have placed orders in [facility] for both DEXA and Mammogram (screening). Please call and schedule for patient. Thanks.

## 2015-09-24 ENCOUNTER — Other Ambulatory Visit: Payer: Self-pay | Admitting: Family Medicine

## 2015-09-24 DIAGNOSIS — Z1231 Encounter for screening mammogram for malignant neoplasm of breast: Secondary | ICD-10-CM

## 2015-09-24 NOTE — Telephone Encounter (Signed)
Patient informed of appointment at [facility] for 6/29 at 10:10am for both screening mammogram and dexa scan.

## 2015-10-01 ENCOUNTER — Other Ambulatory Visit: Payer: Self-pay

## 2015-10-01 ENCOUNTER — Telehealth: Payer: Self-pay | Admitting: Family Medicine

## 2015-10-01 ENCOUNTER — Ambulatory Visit (HOSPITAL_COMMUNITY): Payer: Medicare HMO | Attending: Cardiovascular Disease

## 2015-10-01 ENCOUNTER — Encounter: Payer: Self-pay | Admitting: Family Medicine

## 2015-10-01 DIAGNOSIS — I119 Hypertensive heart disease without heart failure: Secondary | ICD-10-CM | POA: Diagnosis not present

## 2015-10-01 DIAGNOSIS — E119 Type 2 diabetes mellitus without complications: Secondary | ICD-10-CM | POA: Insufficient documentation

## 2015-10-01 DIAGNOSIS — I340 Nonrheumatic mitral (valve) insufficiency: Secondary | ICD-10-CM | POA: Diagnosis not present

## 2015-10-01 DIAGNOSIS — R079 Chest pain, unspecified: Secondary | ICD-10-CM | POA: Diagnosis not present

## 2015-10-01 DIAGNOSIS — I071 Rheumatic tricuspid insufficiency: Secondary | ICD-10-CM | POA: Insufficient documentation

## 2015-10-01 DIAGNOSIS — E785 Hyperlipidemia, unspecified: Secondary | ICD-10-CM | POA: Diagnosis not present

## 2015-10-01 DIAGNOSIS — I5032 Chronic diastolic (congestive) heart failure: Secondary | ICD-10-CM | POA: Insufficient documentation

## 2015-10-01 LAB — ECHOCARDIOGRAM COMPLETE
Ao-asc: 31 cm
CHL CUP MV DEC (S): 335
CHL CUP RV SYS PRESS: 29 mmHg
E decel time: 335 msec
E/e' ratio: 12.91
FS: 29 % (ref 28–44)
IV/PV OW: 1.07
LA diam end sys: 32 mm
LA diam index: 1.57 cm/m2
LA vol A4C: 38 ml
LA vol index: 23 mL/m2
LA vol: 47 mL
LASIZE: 32 mm
LDCA: 3.14 cm2
LV PW d: 10.5 mm — AB (ref 0.6–1.1)
LV TDI E'LATERAL: 5.85
LV dias vol index: 29 mL/m2
LV e' LATERAL: 5.85 cm/s
LV sys vol index: 12 mL/m2
LV sys vol: 24 mL (ref 14–42)
LVDIAVOL: 60 mL (ref 46–106)
LVEEAVG: 12.91
LVEEMED: 12.91
LVOT VTI: 22.1 cm
LVOT diameter: 20 mm
LVOT peak grad rest: 3 mmHg
LVOTPV: 87.2 cm/s
LVOTSV: 69 mL
MV pk E vel: 75.5 m/s
MVPG: 2 mmHg
MVPKAVEL: 88.4 m/s
RV LATERAL S' VELOCITY: 13.7 cm/s
Reg peak vel: 257 cm/s
Simpson's disk: 60
Stroke v: 36 ml
TDI e' medial: 5.85
TR max vel: 257 cm/s

## 2015-10-01 NOTE — Telephone Encounter (Signed)
Called patient and discussed Echo results. Grade 1 diastolic dysfunction. No changes in therapy at this time.

## 2015-10-03 ENCOUNTER — Ambulatory Visit (INDEPENDENT_AMBULATORY_CARE_PROVIDER_SITE_OTHER): Payer: Medicare HMO | Admitting: Family Medicine

## 2015-10-03 ENCOUNTER — Encounter: Payer: Self-pay | Admitting: Family Medicine

## 2015-10-03 VITALS — BP 124/72 | HR 74 | Temp 98.0°F | Ht 60.0 in | Wt 204.0 lb

## 2015-10-03 DIAGNOSIS — Z23 Encounter for immunization: Secondary | ICD-10-CM

## 2015-10-03 DIAGNOSIS — J449 Chronic obstructive pulmonary disease, unspecified: Secondary | ICD-10-CM | POA: Diagnosis not present

## 2015-10-03 DIAGNOSIS — J45909 Unspecified asthma, uncomplicated: Secondary | ICD-10-CM

## 2015-10-03 DIAGNOSIS — R2681 Unsteadiness on feet: Secondary | ICD-10-CM | POA: Diagnosis not present

## 2015-10-03 DIAGNOSIS — I10 Essential (primary) hypertension: Secondary | ICD-10-CM | POA: Diagnosis not present

## 2015-10-03 DIAGNOSIS — R079 Chest pain, unspecified: Secondary | ICD-10-CM

## 2015-10-03 DIAGNOSIS — R32 Unspecified urinary incontinence: Secondary | ICD-10-CM | POA: Diagnosis not present

## 2015-10-03 MED ORDER — BLOOD PRESSURE MONITOR/M CUFF MISC: Status: DC

## 2015-10-03 NOTE — Assessment & Plan Note (Signed)
Mixed (stress, urge, overflow from not going).  -provided Kegal Exercises -continue to wear briefs as needed -if not improved consider Myrbetriq at next visit

## 2015-10-03 NOTE — Assessment & Plan Note (Signed)
Likely multifactorial (DM, Exotropia). -prescription for 4 wheeled walker with seat -continue home PT

## 2015-10-03 NOTE — Progress Notes (Signed)
   Subjective:    Patient ID: Desiree Parker, female    DOB: November 04, 1945, 70 y.o.   MRN: [id number]  HPI 70 y/o female presents for routine follow up.  Hypertension - taking Enalapril 10 mg daily, no side effects, no headaches, no vision changes (due for eye appointment). Blood pressure on nurse visit on 6/8 was 150/70; BP at home per PT/RN (140's/80's). Reports more baking of foods (less fried foods). Reports attempting to cut salt out of diet.   Chest Pain - recently had Echo (had pain over sight of [place]), has been improved, left side under breast, no anterior chest pain, no associated sob  COPD - uses albuterol when she exerts herself, no sob at rest  Trip at home - when in a hurry reports occasional trips, able to catch self, no fall to floor, no dizziness  Urine leaking - wearing briefs, with stress/sneezing and when she "waits too long", also has urge symptoms.   Social - has had PT coming to home, uses wheel walker at home (neighbor's). PT recommended grab bar for shower  HM - has mammogram scheduled on 6/29   Review of Systems  Constitutional: Negative for fever, chills and fatigue.  Respiratory: Negative for shortness of breath.   Gastrointestinal: Negative for nausea, vomiting and constipation.       Objective:   Physical Exam BP 124/72 mmHg  Pulse 74  Temp(Src) 98 F (36.7 C) (Oral)  Ht 5' (1.524 m)  Wt 204 lb (92.534 kg)  BMI 39.84 kg/m2 Gen: pleasant female, NAD Cardiac: RRR, S1 and S2 present, no murmurs Resp: CTAB, normal effort MSK: no anterior chest tenderness on palpation, patient reports tenderness when palpating under left breast (site of [place]).  Abd: soft, no tenderness, NBS Neuro: gait - patient has slightly wide based gait, does use walls/bed for security     Assessment & Plan:  HYPERTENSION, BENIGN SYSTEMIC Controlled on Enalapril. -continue Enalapril. -check BMP  Unsteady gait Likely multifactorial (DM, Exotropia). -prescription for 4 wheeled  walker with seat -continue home PT   Bladder incontinence Mixed (stress, urge, overflow from not going).  -provided Kegal Exercises -continue to wear briefs as needed -if not improved consider Myrbetriq at next visit  Chest pain Left (under breast) pain that is reproducible in area of recent Cardiac [place]. - recent EKG showed no ischemic changes and Echo showed Grade 1 Diastolic Dysfunction. - Clinically suspect MSK etiology. - Discussed return precautions

## 2015-10-03 NOTE — Patient Instructions (Addendum)
It was nice to see you today.  Kegal Exercises (Please try these to help with urine leaking) Find the muscles you use to stop urinating. Squeeze these muscles for 3 seconds. Then relax for 3 seconds. Your stomach and thigh muscles should not tighten when you do this. Add 1 second each week until you are able to squeeze for 10 seconds each time. Repeat this exercise 10 to 15 times per session. Try to do this at least 3 times a day. Don't do Kegels while you urinate. Doing them during urination can hurt your bladder.   Blood pressure - well controlled, continue Enalapril 10 mg daily, I have given you a home blood pressure cuff.  COPD - use albuterol as needed for shortness of breath  I have also given you a prescription for a home walker with seat.

## 2015-10-03 NOTE — Assessment & Plan Note (Signed)
Left (under breast) pain that is reproducible in area of recent Cardiac [place]. - recent EKG showed no ischemic changes and Echo showed Grade 1 Diastolic Dysfunction. - Clinically suspect MSK etiology. - Discussed return precautions

## 2015-10-03 NOTE — Assessment & Plan Note (Signed)
Controlled on Enalapril. -continue Enalapril. -check BMP

## 2015-10-04 LAB — BASIC METABOLIC PANEL WITH GFR
BUN: 12 mg/dL (ref 7–25)
CHLORIDE: 102 mmol/L (ref 98–110)
CO2: 27 mmol/L (ref 20–31)
CREATININE: 0.87 mg/dL (ref 0.50–0.99)
Calcium: 9.4 mg/dL (ref 8.6–10.4)
GFR, Est African American: 79 mL/min (ref >=60)
GFR, Est Non African American: 69 mL/min (ref >=60)
GLUCOSE: 110 mg/dL — AB (ref 65–99)
Potassium: 4 mmol/L (ref 3.5–5.3)
Sodium: 139 mmol/L (ref 135–146)

## 2015-10-06 ENCOUNTER — Encounter: Payer: Self-pay | Admitting: Family Medicine

## 2015-10-09 ENCOUNTER — Ambulatory Visit
Admission: RE | Admit: 2015-10-09 | Discharge: 2015-10-09 | Disposition: A | Discharge status: Home / Self Care | Payer: Medicare HMO | Source: Ambulatory Visit | Attending: Family Medicine | Admitting: Family Medicine

## 2015-10-09 DIAGNOSIS — E2839 Other primary ovarian failure: Secondary | ICD-10-CM

## 2015-10-09 DIAGNOSIS — Z1231 Encounter for screening mammogram for malignant neoplasm of breast: Secondary | ICD-10-CM

## 2015-10-11 DIAGNOSIS — J189 Pneumonia, unspecified organism: Secondary | ICD-10-CM

## 2015-10-11 HISTORY — DX: Pneumonia, unspecified organism: J18.9

## 2015-10-13 ENCOUNTER — Telehealth: Payer: Self-pay | Admitting: Family Medicine

## 2015-10-13 DIAGNOSIS — M810 Age-related osteoporosis without current pathological fracture: Secondary | ICD-10-CM | POA: Insufficient documentation

## 2015-10-13 MED ORDER — ALENDRONATE SODIUM 70 MG PO TABS: 70.0000 mg | ORAL_TABLET | ORAL | Status: DC

## 2015-10-13 MED ORDER — CALCIUM CARB-CHOLECALCIFEROL 500-400 MG-UNIT PO TABS: 2.0000 | ORAL_TABLET | Freq: Every morning | ORAL | Status: DC

## 2015-10-13 NOTE — Telephone Encounter (Signed)
Discussed DEXA scan results. Consistent with Osteoporosis. Patient does not drink milk, eats one yogurt per day, does not take Ca or Vit D supplementation. Will start Ca/Vit D and Fosamax. Discussed taking Fosamax 30 minutes before food and to remain upright after taking.

## 2015-10-13 NOTE — Assessment & Plan Note (Signed)
Discussed DEXA scan results. Consistent with Osteoporosis. Patient does not drink milk, eats one yogurt per day, does not take Ca or Vit D supplementation. Will start Ca/Vit D and Fosamax. Discussed taking Fosamax 30 minutes before food and to remain upright after taking.

## 2015-10-16 ENCOUNTER — Telehealth: Payer: Self-pay | Admitting: Family Medicine

## 2015-10-16 NOTE — Telephone Encounter (Signed)
Telephone note read and noted.

## 2015-10-16 NOTE — Telephone Encounter (Signed)
[facility]:  Fosamax was given to pt. She did not read the instructions how to take the medication and was taking it everyday.  Pt doesn't seem to have any side effects.  Romie Minus has explained to the pt how to take the medication and will check on the pt again next week

## 2015-10-21 ENCOUNTER — Encounter (HOSPITAL_COMMUNITY): Payer: Self-pay | Admitting: Emergency Medicine

## 2015-10-21 ENCOUNTER — Emergency Department (HOSPITAL_COMMUNITY): Payer: Medicare HMO

## 2015-10-21 DIAGNOSIS — M791 Myalgia: Secondary | ICD-10-CM | POA: Diagnosis not present

## 2015-10-21 DIAGNOSIS — Z79899 Other long term (current) drug therapy: Secondary | ICD-10-CM | POA: Diagnosis not present

## 2015-10-21 DIAGNOSIS — M25561 Pain in right knee: Secondary | ICD-10-CM | POA: Diagnosis not present

## 2015-10-21 DIAGNOSIS — N390 Urinary tract infection, site not specified: Secondary | ICD-10-CM | POA: Insufficient documentation

## 2015-10-21 DIAGNOSIS — M25562 Pain in left knee: Secondary | ICD-10-CM | POA: Insufficient documentation

## 2015-10-21 DIAGNOSIS — E119 Type 2 diabetes mellitus without complications: Secondary | ICD-10-CM | POA: Insufficient documentation

## 2015-10-21 DIAGNOSIS — I10 Essential (primary) hypertension: Secondary | ICD-10-CM | POA: Diagnosis not present

## 2015-10-21 DIAGNOSIS — Z87891 Personal history of nicotine dependence: Secondary | ICD-10-CM | POA: Diagnosis not present

## 2015-10-21 DIAGNOSIS — Z7982 Long term (current) use of aspirin: Secondary | ICD-10-CM | POA: Diagnosis not present

## 2015-10-21 DIAGNOSIS — R079 Chest pain, unspecified: Secondary | ICD-10-CM | POA: Diagnosis present

## 2015-10-21 LAB — BASIC METABOLIC PANEL
Anion gap: 7 (ref 5–15)
BUN: 8 mg/dL (ref 6–20)
CALCIUM: 8.5 mg/dL — AB (ref 8.9–10.3)
CO2: 27 mmol/L (ref 22–32)
Chloride: 104 mmol/L (ref 101–111)
Creatinine, Ser: 0.77 mg/dL (ref 0.44–1.00)
GFR calc Af Amer: >60 mL/min (ref >60)
GLUCOSE: 133 mg/dL — AB (ref 65–99)
POTASSIUM: 3.2 mmol/L — AB (ref 3.5–5.1)
SODIUM: 138 mmol/L (ref 135–145)

## 2015-10-21 LAB — CBC
HCT: 39.7 % (ref 36.0–46.0)
HEMOGLOBIN: 13.5 g/dL (ref 12.0–15.0)
MCH: 29.1 pg (ref 26.0–34.0)
MCHC: 34 g/dL (ref 30.0–36.0)
MCV: 85.6 fL (ref 78.0–100.0)
Platelets: 245 10*3/uL (ref 150–400)
RBC: 4.64 MIL/uL (ref 3.87–5.11)
RDW: 13 % (ref 11.5–15.5)
WBC: 5.4 10*3/uL (ref 4.0–10.5)

## 2015-10-21 LAB — I-STAT TROPONIN, ED: TROPONIN I, POC: 0 ng/mL (ref 0.00–0.08)

## 2015-10-21 NOTE — ED Notes (Signed)
Pt states around 12pm she started having sharp pains in the center of her chest that was going into her left arm. Pt states she laid down on the couch and woke up a 1pm in a cold sweat. Pt denies any sob or dizziness. Pt is warm and dry in triage. Pt also c/o of bilateral upper leg pain.

## 2015-10-22 ENCOUNTER — Emergency Department (HOSPITAL_COMMUNITY)
Admission: EM | Admit: 2015-10-22 | Discharge: 2015-10-22 | Disposition: A | Discharge status: Home / Self Care | Payer: Medicare HMO | Attending: Emergency Medicine | Admitting: Emergency Medicine

## 2015-10-22 DIAGNOSIS — M791 Myalgia, unspecified site: Secondary | ICD-10-CM

## 2015-10-22 DIAGNOSIS — N390 Urinary tract infection, site not specified: Secondary | ICD-10-CM

## 2015-10-22 LAB — URINALYSIS, ROUTINE W REFLEX MICROSCOPIC
Bilirubin Urine: NEGATIVE
Glucose, UA: NEGATIVE mg/dL
KETONES UR: NEGATIVE mg/dL
Nitrite: POSITIVE — AB
PROTEIN: 100 mg/dL — AB
Specific Gravity, Urine: 1.027 (ref 1.005–1.030)
pH: 5.5 (ref 5.0–8.0)

## 2015-10-22 LAB — URINE MICROSCOPIC-ADD ON

## 2015-10-22 MED ORDER — CEPHALEXIN 500 MG PO CAPS: 500.0000 mg | ORAL_CAPSULE | Freq: Four times a day (QID) | ORAL | Status: DC

## 2015-10-22 MED ORDER — CEPHALEXIN 250 MG PO CAPS
500.0000 mg | ORAL_CAPSULE | Freq: Once | ORAL | Status: AC
  Administered 2015-10-22: 500 mg via ORAL
  Filled 2015-10-22: qty 2

## 2015-10-22 NOTE — ED Notes (Signed)
Patient ambulated with assistance, complaining of bilateral knee pain, right sided flank pain, and continuous chest pain. Patient O2 stats while ambulating was 99%.

## 2015-10-22 NOTE — Discharge Instructions (Signed)

## 2015-10-22 NOTE — ED Provider Notes (Signed)
CSN: [id number]     Arrival date & time 10/21/15  2044 History   First MD Initiated Contact with Patient 10/22/15 0019     Chief Complaint  Patient presents with  . Chest Pain     (Consider location/radiation/quality/duration/timing/severity/associated sxs/prior Treatment) HPI Comments: Patient with a history of DM, HTN, arterial insufficiency presents with complaint of chest pain and SOB that started around 12:30 yesterday afternoon. She states she got up to the bathroom and experienced pain in bilateral knees with subsequent left chest pain radiating to left arm. She is here with her son who reports some baseline SOB but today the patient reports chest pain with worsening DOE. No nausea or vomiting. She states she had some chills yesterday, fluctuating between being hot and being cold. No cough, congestion or sore throat. No lower extremity swelling.  Patient is a 70 y.o. female presenting with chest pain. The history is provided by the patient. No language interpreter was used.  Chest Pain Pain location:  L chest Pain quality: aching and radiating   Pain radiates to:  L shoulder Pain radiates to the back: no   Pain severity:  Moderate Onset quality:  Sudden Duration:  13 hours Associated symptoms: shortness of breath   Associated symptoms: no abdominal pain, no cough, no fever, no nausea and not vomiting     Past Medical History  Diagnosis Date  . Alternating exotropia   . Arterial insufficiency (HCC) 02/04/2004    ABI 0.84  . 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 Diabetes mellitus without complication ([place])   . Hypertension    Past Surgical History  Procedure Laterality Date  . Tubal ligation  1975  . Cesarean section  1975  . Cholecystectomy  1975    states gallstones removed   Family History  Problem Relation Age of Onset  . Hypertension Mother   . Hypertension Sister   . Cancer Mother     lung  . Diabetes Mother   . Diabetes  Father   . Asthma Son    Social History  Substance Use Topics  . Smoking status: Former Smoker -- 2.00 packs/day for 27 years    Types: Cigarettes    Quit date: 09/24/1988  . Smokeless tobacco: Current User    Types: Snuff  . Alcohol Use: 1.0 oz/week    2 drink(s) per week   OB History    No data available     Review of Systems  Constitutional: Positive for chills. Negative for fever.  HENT: Negative.   Respiratory: Positive for shortness of breath. Negative for cough.   Cardiovascular: Positive for chest pain. Negative for leg swelling.  Gastrointestinal: Negative.  Negative for nausea, vomiting and abdominal pain.  Genitourinary: Negative.  Negative for dysuria.  Musculoskeletal:       Bilateral knee pain.  Skin: Negative.  Negative for color change and rash.  Neurological: Negative.  Negative for syncope, facial asymmetry and speech difficulty.  Psychiatric/Behavioral: Negative for confusion.      Allergies  Review of patient's allergies indicates no known allergies.  Home Medications   Prior to Admission medications   Medication Sig Start Date End Date Taking? Authorizing Provider  albuterol (PROVENTIL HFA;VENTOLIN HFA) 108 (90 Base) MCG/ACT inhaler Inhale 2 puffs into the lungs every 6 (six) hours as needed for wheezing. 09/11/15   Lupita Dawn, MD  alendronate (FOSAMAX) 70 MG tablet Take 1 tablet (70 mg total) by mouth every 7 (seven) days.  Take with a full glass of water on an empty stomach. 10/13/15   Lupita Dawn, MD  aspirin 81 MG EC tablet Take 81 mg by mouth daily.      Historical Provider, MD  atorvastatin (LIPITOR) 20 MG tablet Take 1 tablet (20 mg total) by mouth daily. 09/11/15   Lupita Dawn, MD  Blood Pressure Monitoring (BLOOD PRESSURE MONITOR/M CUFF) MISC Use to check blood pressure daily. 10/03/15   Lupita Dawn, MD  Calcium Carb-Cholecalciferol (CALCIUM 500+D3) 500-400 MG-UNIT TABS Take 2 tablets by mouth every morning. 10/13/15   Lupita Dawn, MD   enalapril (VASOTEC) 10 MG tablet Take 1 tablet (10 mg total) by mouth daily. 09/11/15   Lupita Dawn, MD  lidocaine (XYLOCAINE) 5 % ointment Apply 1 application topically as needed. 02/21/15   Lupita Dawn, MD  omeprazole (PRILOSEC) 20 MG capsule Take 1 capsule (20 mg total) by mouth daily. 09/11/15   Lupita Dawn, MD   BP 159/71 mmHg  Pulse 73  Temp(Src) 98.5 F (36.9 C) (Oral)  Resp   Ht 5\' 5"  (1.651 m)  Wt 92.534 kg  BMI 33.95 kg/m2  SpO2 98% Physical Exam  Constitutional: She is oriented to person, place, and time. She appears well-developed and well-nourished.  HENT:  Head: Normocephalic.  Neck: Normal range of motion. Neck supple.  Cardiovascular: Normal rate and regular rhythm.   No murmur heard. No carotid bruit  Pulmonary/Chest: Effort normal and breath sounds normal. She has no wheezes. She has no rales. She exhibits no tenderness.  Diminished breath sounds bilaterally.  Abdominal: Soft. Bowel sounds are normal. There is no tenderness. There is no rebound and no guarding.  Musculoskeletal: Normal range of motion. She exhibits no edema.  Neurological: She is alert and oriented to person, place, and time.  Skin: Skin is warm and dry. No rash noted.  Psychiatric: She has a normal mood and affect.    ED Course  Procedures (including critical care time) Labs Review Labs Reviewed  BASIC METABOLIC PANEL - Abnormal; Notable for the following:    Potassium 3.2 (*)    Glucose, Bld 133 (*)    Calcium 8.5 (*)    All other components within normal limits  CBC  I-STAT TROPOININ, ED   Results for orders placed or performed during the hospital encounter of [id number]  Basic metabolic panel  Result Value Ref Range   Sodium 138 135 - 145 mmol/L   Potassium 3.2 (L) 3.5 - 5.1 mmol/L   Chloride 104 101 - 111 mmol/L   CO2 27 22 - 32 mmol/L   Glucose, Bld 133 (H) 65 - 99 mg/dL   BUN 8 6 - 20 mg/dL   Creatinine, Ser 0.77 0.44 - 1.00 mg/dL   Calcium 8.5 (L) 8.9 - 10.3 mg/dL   GFR  calc non Af Amer >60 >60 mL/min   GFR calc Af Amer >60 >60 mL/min   Anion gap 7 5 - 15  CBC  Result Value Ref Range   WBC 5.4 4.0 - 10.5 K/uL   RBC 4.64 3.87 - 5.11 MIL/uL   Hemoglobin 13.5 12.0 - 15.0 g/dL   HCT 39.7 36.0 - 46.0 %   MCV 85.6 78.0 - 100.0 fL   MCH 29.1 26.0 - 34.0 pg   MCHC 34.0 30.0 - 36.0 g/dL   RDW 13.0 11.5 - 15.5 %   Platelets 245 150 - 400 K/uL  I-stat troponin, ED  Result Value Ref Range  Troponin i, poc 0.00 0.00 - 0.08 ng/mL   Comment 3           Results for orders placed or performed during the hospital encounter of 10/22/15  Urine culture  Result Value Ref Range   Specimen Description URINE, CLEAN CATCH    Special Requests NONE    Culture >=100,000 COLONIES/mL KLEBSIELLA PNEUMONIAE (A)    Report Status 10/24/2015 FINAL    Organism ID, Bacteria KLEBSIELLA PNEUMONIAE (A)       Susceptibility   Klebsiella pneumoniae - MIC*    AMPICILLIN >=32 RESISTANT Resistant     CEFAZOLIN <=4 SENSITIVE Sensitive     CEFTRIAXONE <=1 SENSITIVE Sensitive     CIPROFLOXACIN <=0.25 SENSITIVE Sensitive     GENTAMICIN <=1 SENSITIVE Sensitive     IMIPENEM <=0.25 SENSITIVE Sensitive     NITROFURANTOIN 32 SENSITIVE Sensitive     TRIMETH/SULFA <=20 SENSITIVE Sensitive     AMPICILLIN/SULBACTAM <=2 SENSITIVE Sensitive     PIP/TAZO <=4 SENSITIVE Sensitive     * >=100,000 COLONIES/mL KLEBSIELLA PNEUMONIAE  Basic metabolic panel  Result Value Ref Range   Sodium 138 135 - 145 mmol/L   Potassium 3.2 (L) 3.5 - 5.1 mmol/L   Chloride 104 101 - 111 mmol/L   CO2 27 22 - 32 mmol/L   Glucose, Bld 133 (H) 65 - 99 mg/dL   BUN 8 6 - 20 mg/dL   Creatinine, Ser 0.77 0.44 - 1.00 mg/dL   Calcium 8.5 (L) 8.9 - 10.3 mg/dL   GFR calc non Af Amer >60 >60 mL/min   GFR calc Af Amer >60 >60 mL/min   Anion gap 7 5 - 15  CBC  Result Value Ref Range   WBC 5.4 4.0 - 10.5 K/uL   RBC 4.64 3.87 - 5.11 MIL/uL   Hemoglobin 13.5 12.0 - 15.0 g/dL   HCT 39.7 36.0 - 46.0 %   MCV 85.6 78.0 - 100.0  fL   MCH 29.1 26.0 - 34.0 pg   MCHC 34.0 30.0 - 36.0 g/dL   RDW 13.0 11.5 - 15.5 %   Platelets 245 150 - 400 K/uL  Urinalysis, Routine w reflex microscopic  Result Value Ref Range   Color, Urine AMBER (A) YELLOW   APPearance TURBID (A) CLEAR   Specific Gravity, Urine 1.027 1.005 - 1.030   pH 5.5 5.0 - 8.0   Glucose, UA NEGATIVE NEGATIVE mg/dL   Hgb urine dipstick MODERATE (A) NEGATIVE   Bilirubin Urine NEGATIVE NEGATIVE   Ketones, ur NEGATIVE NEGATIVE mg/dL   Protein, ur 100 (A) NEGATIVE mg/dL   Nitrite POSITIVE (A) NEGATIVE   Leukocytes, UA SMALL (A) NEGATIVE  Urine microscopic-add on  Result Value Ref Range   Squamous Epithelial / LPF TOO NUMEROUS TO COUNT (A) NONE SEEN   WBC, UA 6-30 0 - 5 WBC/hpf   RBC / HPF 0-5 0 - 5 RBC/hpf   Bacteria, UA FEW (A) NONE SEEN  I-stat troponin, ED  Result Value Ref Range   Troponin i, poc 0.00 0.00 - 0.08 ng/mL   Comment 3             Imaging Review Dg Chest 2 View  10/21/2015  CLINICAL DATA:  Chest pain, shortness of breath EXAM: CHEST  2 VIEW COMPARISON:  06/03/2009 FINDINGS: There is bilateral mild perihilar interstitial thickening. There is no focal consolidation. There is no pleural effusion or pneumothorax. The heart and mediastinal contours are unremarkable. The osseous structures are unremarkable. IMPRESSION: Mild bilateral perihilar  interstitial thickening as can be seen with bronchitis. Electronically Signed   By: Kathreen Devoid   On: 10/21/2015 22:38   I have personally reviewed and evaluated these images and lab results as part of my medical decision-making.   EKG Interpretation   Date/Time:  Tuesday October 21 2015 20:53:22 EDT Ventricular Rate:  76 PR Interval:  122 QRS Duration: 100 QT Interval:  404 QTC Calculation: 454 R Axis:   -53 Text Interpretation:  Normal sinus rhythm Left anterior fascicular block  Abnormal ECG When compared with ECG of 09/11/2015, No significant change was  found Confirmed by [facility]  MD, DAVID  ([id number]) on 10/22/2015 12:28:20 AM      MDM   Final diagnoses:  None    1. UTI 2. Myalgia  Patient presents with complaint of chest pain, muscular soreness, chills. She is found to have a urinary tract infection and started on abx. C/O chest pain with negative cardiac evaluation. Likely related to generalized aching, chills in setting of infection. She is examined by Dr. Roxanne Mins and is felt stable for discharge home.     Charlann Lange, PA-C [id number]  Delora Fuel, MD [id number] [phone]

## 2015-10-24 LAB — URINE CULTURE

## 2015-10-25 ENCOUNTER — Telehealth: Payer: Self-pay | Admitting: *Deleted

## 2015-10-25 NOTE — Telephone Encounter (Signed)
Post ED Visit - Positive Culture Follow-up  Culture report reviewed by antimicrobial stewardship pharmacist:  []  Elenor Quinones, Pharm.D. []  Heide Guile, Pharm.D., BCPS []  Parks Neptune, Pharm.D. []  Alycia Rossetti, Pharm.D., BCPS []  Quesada, Pharm.D., BCPS, AAHIVP []  Legrand Como, Pharm.D., BCPS, AAHIVP []  Milus Glazier, Pharm.D. [x]  Stephens November, Pharm.D.  Positive urine culture Treated with Cephalexin, organism sensitive to the same and no further patient follow-up is required at this time.  Harlon Flor [facility] 10/25/2015, 1:10 PM

## 2015-10-26 ENCOUNTER — Emergency Department (HOSPITAL_COMMUNITY): Payer: Medicare HMO

## 2015-10-26 ENCOUNTER — Encounter (HOSPITAL_COMMUNITY): Payer: Self-pay

## 2015-10-26 ENCOUNTER — Observation Stay (HOSPITAL_COMMUNITY): Payer: Medicare HMO

## 2015-10-26 ENCOUNTER — Observation Stay (HOSPITAL_COMMUNITY)
Admission: EM | Admit: 2015-10-26 | Discharge: 2015-10-30 | Disposition: A | Discharge status: Skilled Nursing Facility | Payer: Medicare HMO | Attending: Family Medicine | Admitting: Family Medicine

## 2015-10-26 DIAGNOSIS — I5032 Chronic diastolic (congestive) heart failure: Secondary | ICD-10-CM | POA: Insufficient documentation

## 2015-10-26 DIAGNOSIS — J4489 Other specified chronic obstructive pulmonary disease: Secondary | ICD-10-CM | POA: Diagnosis present

## 2015-10-26 DIAGNOSIS — M205X2 Other deformities of toe(s) (acquired), left foot: Secondary | ICD-10-CM | POA: Insufficient documentation

## 2015-10-26 DIAGNOSIS — E785 Hyperlipidemia, unspecified: Secondary | ICD-10-CM | POA: Diagnosis not present

## 2015-10-26 DIAGNOSIS — E669 Obesity, unspecified: Secondary | ICD-10-CM | POA: Diagnosis not present

## 2015-10-26 DIAGNOSIS — R0600 Dyspnea, unspecified: Secondary | ICD-10-CM | POA: Diagnosis present

## 2015-10-26 DIAGNOSIS — M205X1 Other deformities of toe(s) (acquired), right foot: Secondary | ICD-10-CM | POA: Diagnosis not present

## 2015-10-26 DIAGNOSIS — R918 Other nonspecific abnormal finding of lung field: Secondary | ICD-10-CM | POA: Insufficient documentation

## 2015-10-26 DIAGNOSIS — R52 Pain, unspecified: Secondary | ICD-10-CM

## 2015-10-26 DIAGNOSIS — R079 Chest pain, unspecified: Secondary | ICD-10-CM | POA: Diagnosis present

## 2015-10-26 DIAGNOSIS — J189 Pneumonia, unspecified organism: Principal | ICD-10-CM | POA: Diagnosis present

## 2015-10-26 DIAGNOSIS — R0602 Shortness of breath: Secondary | ICD-10-CM | POA: Diagnosis present

## 2015-10-26 DIAGNOSIS — B961 Klebsiella pneumoniae [K. pneumoniae] as the cause of diseases classified elsewhere: Secondary | ICD-10-CM | POA: Insufficient documentation

## 2015-10-26 DIAGNOSIS — M7989 Other specified soft tissue disorders: Secondary | ICD-10-CM

## 2015-10-26 DIAGNOSIS — E119 Type 2 diabetes mellitus without complications: Secondary | ICD-10-CM | POA: Diagnosis not present

## 2015-10-26 DIAGNOSIS — N390 Urinary tract infection, site not specified: Secondary | ICD-10-CM | POA: Diagnosis not present

## 2015-10-26 DIAGNOSIS — I110 Hypertensive heart disease with heart failure: Secondary | ICD-10-CM | POA: Insufficient documentation

## 2015-10-26 DIAGNOSIS — Z87891 Personal history of nicotine dependence: Secondary | ICD-10-CM | POA: Insufficient documentation

## 2015-10-26 DIAGNOSIS — M1310 Monoarthritis, not elsewhere classified, unspecified site: Secondary | ICD-10-CM | POA: Diagnosis present

## 2015-10-26 DIAGNOSIS — Z7982 Long term (current) use of aspirin: Secondary | ICD-10-CM | POA: Insufficient documentation

## 2015-10-26 DIAGNOSIS — R0609 Other forms of dyspnea: Secondary | ICD-10-CM | POA: Insufficient documentation

## 2015-10-26 DIAGNOSIS — Z66 Do not resuscitate: Secondary | ICD-10-CM | POA: Insufficient documentation

## 2015-10-26 DIAGNOSIS — M109 Gout, unspecified: Secondary | ICD-10-CM | POA: Diagnosis present

## 2015-10-26 DIAGNOSIS — J449 Chronic obstructive pulmonary disease, unspecified: Secondary | ICD-10-CM | POA: Diagnosis present

## 2015-10-26 DIAGNOSIS — Z6831 Body mass index (BMI) 31.0-31.9, adult: Secondary | ICD-10-CM | POA: Diagnosis not present

## 2015-10-26 HISTORY — DX: Monoarthritis, not elsewhere classified, unspecified site: M13.10

## 2015-10-26 HISTORY — DX: Chronic obstructive pulmonary disease, unspecified: J44.9

## 2015-10-26 HISTORY — DX: Pneumonia, unspecified organism: J18.9

## 2015-10-26 LAB — URINALYSIS, ROUTINE W REFLEX MICROSCOPIC
BILIRUBIN URINE: NEGATIVE
Glucose, UA: NEGATIVE mg/dL
Ketones, ur: 15 mg/dL — AB
Leukocytes, UA: NEGATIVE
Nitrite: NEGATIVE
PH: 6 (ref 5.0–8.0)
Protein, ur: 100 mg/dL — AB
SPECIFIC GRAVITY, URINE: 1.014 (ref 1.005–1.030)

## 2015-10-26 LAB — CBC WITH DIFFERENTIAL/PLATELET
BASOS ABS: 0 10*3/uL (ref 0.0–0.1)
BASOS PCT: 0 %
EOS ABS: 0 10*3/uL (ref 0.0–0.7)
EOS PCT: 0 %
HCT: 35.4 % — ABNORMAL LOW (ref 36.0–46.0)
Hemoglobin: 12.3 g/dL (ref 12.0–15.0)
LYMPHS ABS: 1.8 10*3/uL (ref 0.7–4.0)
Lymphocytes Relative: 13 %
MCH: 29.5 pg (ref 26.0–34.0)
MCHC: 34.7 g/dL (ref 30.0–36.0)
MCV: 84.9 fL (ref 78.0–100.0)
Monocytes Absolute: 1.9 10*3/uL — ABNORMAL HIGH (ref 0.1–1.0)
Monocytes Relative: 14 %
Neutro Abs: 10 10*3/uL — ABNORMAL HIGH (ref 1.7–7.7)
Neutrophils Relative %: 73 %
PLATELETS: 277 10*3/uL (ref 150–400)
RBC: 4.17 MIL/uL (ref 3.87–5.11)
RDW: 12.6 % (ref 11.5–15.5)
WBC: 13.7 10*3/uL — AB (ref 4.0–10.5)

## 2015-10-26 LAB — GLUCOSE, CAPILLARY
Glucose-Capillary: 120 mg/dL — ABNORMAL HIGH (ref 65–99)
Glucose-Capillary: 147 mg/dL — ABNORMAL HIGH (ref 65–99)

## 2015-10-26 LAB — COMPREHENSIVE METABOLIC PANEL
ALBUMIN: 3.2 g/dL — AB (ref 3.5–5.0)
ALT: 43 U/L (ref 14–54)
AST: 66 U/L — ABNORMAL HIGH (ref 15–41)
Alkaline Phosphatase: 99 U/L (ref 38–126)
Anion gap: 9 (ref 5–15)
BILIRUBIN TOTAL: 1.7 mg/dL — AB (ref 0.3–1.2)
BUN: 9 mg/dL (ref 6–20)
CO2: 25 mmol/L (ref 22–32)
CREATININE: 0.85 mg/dL (ref 0.44–1.00)
Calcium: 8.3 mg/dL — ABNORMAL LOW (ref 8.9–10.3)
Chloride: 99 mmol/L — ABNORMAL LOW (ref 101–111)
Glucose, Bld: 127 mg/dL — ABNORMAL HIGH (ref 65–99)
POTASSIUM: 3.2 mmol/L — AB (ref 3.5–5.1)
SODIUM: 133 mmol/L — AB (ref 135–145)
TOTAL PROTEIN: 7.4 g/dL (ref 6.5–8.1)

## 2015-10-26 LAB — TROPONIN I: Troponin I: <0.03 ng/mL (ref <0.03)

## 2015-10-26 LAB — URINE MICROSCOPIC-ADD ON

## 2015-10-26 LAB — BRAIN NATRIURETIC PEPTIDE: B Natriuretic Peptide: 43 pg/mL (ref 0.0–100.0)

## 2015-10-26 LAB — I-STAT TROPONIN, ED: TROPONIN I, POC: 0.02 ng/mL (ref 0.00–0.08)

## 2015-10-26 LAB — TSH: TSH: 3.024 u[IU]/mL (ref 0.350–4.500)

## 2015-10-26 MED ORDER — INSULIN ASPART 100 UNIT/ML ~~LOC~~ SOLN: 0.0000 [IU] | Freq: Every day | SUBCUTANEOUS | Status: DC

## 2015-10-26 MED ORDER — ENOXAPARIN SODIUM 40 MG/0.4ML ~~LOC~~ SOLN
40.0000 mg | SUBCUTANEOUS | Status: DC
  Administered 2015-10-26 – 2015-10-29 (×4): 40 mg via SUBCUTANEOUS
  Filled 2015-10-26 (×4): qty 0.4

## 2015-10-26 MED ORDER — ONDANSETRON HCL 4 MG/2ML IJ SOLN: 4.0000 mg | Freq: Four times a day (QID) | INTRAMUSCULAR | Status: DC | PRN

## 2015-10-26 MED ORDER — ACETAMINOPHEN 325 MG PO TABS
650.0000 mg | ORAL_TABLET | ORAL | Status: DC | PRN
  Administered 2015-10-26 – 2015-10-30 (×4): 650 mg via ORAL
  Filled 2015-10-26 (×5): qty 2

## 2015-10-26 MED ORDER — ENALAPRIL MALEATE 10 MG PO TABS
10.0000 mg | ORAL_TABLET | Freq: Every day | ORAL | Status: DC
  Administered 2015-10-27 – 2015-10-30 (×4): 10 mg via ORAL
  Filled 2015-10-26 (×4): qty 1

## 2015-10-26 MED ORDER — POTASSIUM CHLORIDE CRYS ER 20 MEQ PO TBCR
40.0000 meq | EXTENDED_RELEASE_TABLET | Freq: Once | ORAL | Status: AC
  Administered 2015-10-26: 40 meq via ORAL
  Filled 2015-10-26: qty 2

## 2015-10-26 MED ORDER — INSULIN ASPART 100 UNIT/ML ~~LOC~~ SOLN
0.0000 [IU] | Freq: Three times a day (TID) | SUBCUTANEOUS | Status: DC
  Administered 2015-10-27: 2 [IU] via SUBCUTANEOUS
  Administered 2015-10-27 – 2015-10-28 (×2): 3 [IU] via SUBCUTANEOUS
  Administered 2015-10-29 (×2): 2 [IU] via SUBCUTANEOUS
  Administered 2015-10-29: 3 [IU] via SUBCUTANEOUS
  Administered 2015-10-30 (×2): 2 [IU] via SUBCUTANEOUS

## 2015-10-26 MED ORDER — ASPIRIN EC 81 MG PO TBEC
81.0000 mg | DELAYED_RELEASE_TABLET | Freq: Every day | ORAL | Status: DC
  Administered 2015-10-27 – 2015-10-30 (×4): 81 mg via ORAL
  Filled 2015-10-26 (×4): qty 1

## 2015-10-26 MED ORDER — CEPHALEXIN 500 MG PO CAPS
500.0000 mg | ORAL_CAPSULE | Freq: Four times a day (QID) | ORAL | Status: AC
  Administered 2015-10-26: 500 mg via ORAL
  Filled 2015-10-26: qty 1

## 2015-10-26 MED ORDER — GI COCKTAIL ~~LOC~~: 30.0000 mL | Freq: Four times a day (QID) | ORAL | Status: DC | PRN

## 2015-10-26 MED ORDER — ATORVASTATIN CALCIUM 40 MG PO TABS
40.0000 mg | ORAL_TABLET | Freq: Every day | ORAL | Status: DC
  Administered 2015-10-27 – 2015-10-30 (×4): 40 mg via ORAL
  Filled 2015-10-26 (×4): qty 1

## 2015-10-26 MED ORDER — PANTOPRAZOLE SODIUM 40 MG PO TBEC
40.0000 mg | DELAYED_RELEASE_TABLET | Freq: Every day | ORAL | Status: DC
  Administered 2015-10-27 – 2015-10-30 (×4): 40 mg via ORAL
  Filled 2015-10-26 (×4): qty 1

## 2015-10-26 MED ORDER — IPRATROPIUM-ALBUTEROL 0.5-2.5 (3) MG/3ML IN SOLN
3.0000 mL | RESPIRATORY_TRACT | Status: DC | PRN
  Administered 2015-10-26: 3 mL via RESPIRATORY_TRACT
  Filled 2015-10-26: qty 3

## 2015-10-26 MED ORDER — ATORVASTATIN CALCIUM 20 MG PO TABS
20.0000 mg | ORAL_TABLET | Freq: Every day | ORAL | Status: DC
Start: 2015-10-27 — End: 2015-10-26

## 2015-10-26 MED ORDER — IPRATROPIUM-ALBUTEROL 0.5-2.5 (3) MG/3ML IN SOLN
3.0000 mL | Freq: Once | RESPIRATORY_TRACT | Status: AC
  Administered 2015-10-26: 3 mL via RESPIRATORY_TRACT
  Filled 2015-10-26: qty 3

## 2015-10-26 NOTE — Progress Notes (Signed)
CSW provided pt son with ALF information.  Lendon Collar, [facility] Clinical Social Worker [phone]

## 2015-10-26 NOTE — ED Provider Notes (Signed)
CSN: [id number]     Arrival date & time 10/26/15  1004 History   First MD Initiated Contact with Patient 10/26/15 1006     Chief Complaint  Patient presents with  . Chest Pain     (Consider location/radiation/quality/duration/timing/severity/associated sxs/prior Treatment) HPI Comments: Desiree Parker is a 70 y.o. female with h/o T2DM, HLD, obesity, COPD, tobacco use, diastolic CHF last echo [id number] with EF of 55-60% presents to ED with complaint of shortness of breath and leg swelling. She was recent recently seen in the emergency department on 10/22/2015 for chest pain. Cardiac workup at that time was negative; however, she was found to have a urinary tract infection and placed on antibiotics. Patient presents today with bilateral lower extremity swelling and worsening dyspnea. She states that the swelling started after her last ED visit. She endorses worsening shortness of breath. It was noted as she was transferring from wheelchair to bed in ED she was dyspneic. She has associated wheezing, nonproductive cough, chills, diaphoresis, decreased appetite, generalized fatigue. She does endorse chest pain; it is non-radiating, centrally located described as a tightness and only occurs when she is feeling short of breath. She denies changes in vision, headache, numbness, weakness, sore throat. She denies history of cancer, recent long-distance travel/immobilization/surgery, history of blood clots, no hormone therapy, or hemoptysis.   Patient lives at home alone with her son living nearby who frequently stopped by to check in on her intake care for. Son states that patient did not see a doctor for over 2 years however, told him that she was seen a doctor regularly. A nurse was visiting patient in home twice a week; however, that has recently stopped. Son is requesting assistance at home. He also endorses that she has been more unsteady on her feet over the last few days.  Patient is a 70 y.o. female  presenting with chest pain. The history is provided by the patient and medical records.  Chest Pain Associated symptoms: cough, diaphoresis, fatigue and shortness of breath     Past Medical History  Diagnosis Date  . Alternating exotropia   . Arterial insufficiency (HCC) 02/04/2004    ABI 0.84  . 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 Diabetes mellitus without complication ([place])   . Hypertension    Past Surgical History  Procedure Laterality Date  . Tubal ligation  1975  . Cesarean section  1975  . Cholecystectomy  1975    states gallstones removed   Family History  Problem Relation Age of Onset  . Hypertension Mother   . Hypertension Sister   . Cancer Mother     lung  . Diabetes Mother   . Diabetes Father   . Asthma Son    Social History  Substance Use Topics  . Smoking status: Former Smoker -- 2.00 packs/day for 27 years    Types: Cigarettes    Quit date: 09/24/1988  . Smokeless tobacco: Current User    Types: Snuff  . Alcohol Use: 1.0 oz/week    2 Standard drinks or equivalent per week   OB History    No data available     Review of Systems  Constitutional: Positive for chills, diaphoresis, appetite change and fatigue.  Respiratory: Positive for cough, chest tightness, shortness of breath and wheezing.   Cardiovascular: Positive for chest pain.  All other systems reviewed and are negative.     Allergies  Review of patient's allergies indicates no known allergies.  Home  Medications   Prior to Admission medications   Medication Sig Start Date End Date Taking? Authorizing Provider  albuterol (PROVENTIL HFA;VENTOLIN HFA) 108 (90 Base) MCG/ACT inhaler Inhale 2 puffs into the lungs every 6 (six) hours as needed for wheezing. 09/11/15   Lupita Dawn, MD  alendronate (FOSAMAX) 70 MG tablet Take 1 tablet (70 mg total) by mouth every 7 (seven) days. Take with a full glass of water on an empty stomach. 10/13/15   Lupita Dawn, MD    aspirin 81 MG EC tablet Take 81 mg by mouth daily.      Historical Provider, MD  atorvastatin (LIPITOR) 20 MG tablet Take 1 tablet (20 mg total) by mouth daily. 09/11/15   Lupita Dawn, MD  Calcium Carb-Cholecalciferol (CALCIUM 500+D3) 500-400 MG-UNIT TABS Take 2 tablets by mouth every morning. 10/13/15   Lupita Dawn, MD  cephALEXin (KEFLEX) 500 MG capsule Take 1 capsule (500 mg total) by mouth 4 (four) times daily. 10/22/15   Charlann Lange, PA-C  enalapril (VASOTEC) 10 MG tablet Take 1 tablet (10 mg total) by mouth daily. 09/11/15   Lupita Dawn, MD  lidocaine (XYLOCAINE) 5 % ointment Apply 1 application topically as needed. Patient taking differently: Apply 1 application topically daily as needed for mild pain.  02/21/15   Lupita Dawn, MD  omeprazole (PRILOSEC) 20 MG capsule Take 1 capsule (20 mg total) by mouth daily. 09/11/15   Lupita Dawn, MD   BP 138/74 mmHg  Pulse 86  Temp(Src) 97.8 F (36.6 C) (Oral)  Resp 18  SpO2 97% Physical Exam  Constitutional: She appears well-developed. No distress.  HENT:  Head: Normocephalic and atraumatic.  Mouth/Throat: Uvula is midline, oropharynx is clear and moist and mucous membranes are normal. No trismus in the jaw. No oropharyngeal exudate.  Eyes: Conjunctivae and EOM are normal. Pupils are equal, round, and reactive to light. Right eye exhibits no discharge. Left eye exhibits no discharge. No scleral icterus.  Neck: Normal range of motion. Neck supple.  Cardiovascular: Normal rate, regular rhythm, normal heart sounds and intact distal pulses.   No murmur heard. Pulmonary/Chest: Effort normal. No respiratory distress. She has decreased breath sounds ( diffuse). She has no wheezes. She has no rhonchi. She has no rales. She exhibits tenderness.    Abdominal: Soft. Bowel sounds are normal. There is no tenderness. There is no rebound and no guarding.  Obese appearing abdomen  Musculoskeletal: Normal range of motion. She exhibits edema.  B/l lower  extremity swelling, R > L. No TTP of posterior calf. Left calf measures 33.5cm, right calf measures 33.25cm.  Lymphadenopathy:    She has no cervical adenopathy.  Neurological: She is alert. Coordination normal.  Mental Status:  Alert, thought content appropriate, able to give a coherent history. Speech fluent without evidence of aphasia. Able to follow 2 step commands without difficulty.  Cranial Nerves:  II:  Peripheral visual fields grossly normal, pupils equal, round, reactive to light III,IV, VI: ptosis not present, extra-ocular motions intact bilaterally  V,VII: smile symmetric, facial light touch sensation equal VIII: hearing grossly normal to voice  X: uvula elevates symmetrically  XI: bilateral shoulder shrug symmetric and strong XII: midline tongue extension without fassiculations Motor:  Normal tone. 5/5 in upper and lower extremities bilaterally including strong and equal grip strength and dorsiflexion/plantar flexion Sensory: light touch normal in all extremities.  Cerebellar: normal finger-to-nose with bilateral upper extremities CV: distal pulses palpable throughout   Skin: Skin is warm  and dry. She is not diaphoretic.  Psychiatric: She has a normal mood and affect.    ED Course  Procedures (including critical care time) Labs Review Labs Reviewed  CBC WITH DIFFERENTIAL/PLATELET - Abnormal; Notable for the following:    WBC 13.7 (*)    HCT 35.4 (*)    Neutro Abs 10.0 (*)    Monocytes Absolute 1.9 (*)    All other components within normal limits  COMPREHENSIVE METABOLIC PANEL - Abnormal; Notable for the following:    Sodium 133 (*)    Potassium 3.2 (*)    Chloride 99 (*)    Glucose, Bld 127 (*)    Calcium 8.3 (*)    Albumin 3.2 (*)    AST 66 (*)    Total Bilirubin 1.7 (*)    All other components within normal limits  BRAIN NATRIURETIC PEPTIDE  URINALYSIS, ROUTINE W REFLEX MICROSCOPIC (NOT AT [facility])  TROPONIN I  Randolm Idol, ED    Imaging Review Dg  Chest 2 View  10/26/2015  CLINICAL DATA:  Left-sided chest pain and shortness of breath for 5 days. EXAM: CHEST  2 VIEW COMPARISON:  10/21/2015.  01/27/2013. FINDINGS: Stable asymmetric elevation right hemidiaphragm. No focal airspace consolidation. No pulmonary edema or pleural effusion. Right hilar fullness is stable The visualized bony structures of the thorax are intact. IMPRESSION: No active cardiopulmonary disease. Electronically Signed   By: Misty Stanley M.D.   On: 10/26/2015 11:46   I have personally reviewed and evaluated these images and lab results as part of my medical decision-making.   EKG Interpretation   Date/Time:  Sunday October 26 2015 10:13:35 EDT Ventricular Rate:  89 PR Interval:  118 QRS Duration: 88 QT Interval:  382 QTC Calculation: 464 R Axis:   -58 Text Interpretation:  Normal sinus rhythm Left anterior fascicular block  Minimal voltage criteria for LVH, may be normal variant Nonspecific ST  abnormality Abnormal ECG No STEMI.  Compared to previous tracing  Reconfirmed by LONG MD, JOSHUA [phone]) on 10/26/2015 10:20:00 AM     Filed Vitals:   10/26/15 1330 10/26/15 1345 10/26/15 1400 10/26/15 1430  BP: 105/58 139/68 123/60 129/79  Pulse: 81 81 80 87  Temp:      TempSrc:      Resp: 22 18 20 26   SpO2: 98% 97% 97% 97%    MDM   Final diagnoses:  Shortness of breath  Leg swelling  Chest pain, unspecified chest pain type    Patient is afebrile and nontoxic appearing in no acute distress. She appears older than her stated age. On initial evaluation blood pressure was mildly was mildly elevated, O2 sats in the upper 90s and normal respirations. Physical exam remarkable for tenderness to palpation of her chest wall, diffuse diminished breath sounds, and bilateral lower extremity swelling. BNP normal. Mild abnormalities in electrolytes; however, similar to previous labs. CBC is remarkable for leukocytosis with left shift, this is new, pt has been on ABX since  Wednesday for UTI and urine cultures suggest klebsiella should be sensitive to keflex. Chest x-ray negative for consolidation, pulmonary effusion, vascular congestion, PTX. Troponin negative. EKG NSR. Low suspicion for PE - she is not tachycardic or hypoxic. Low suspicion for DVT - calf measurement 33.25cm in right vs. 33.5cm left, no TTP of posterior calf.   Pt lives at home alone and there is concern for medication non-compliance - home health face to face and social work consult placed. On re-evaluation, pt is dyspneic, will give breathing  treatment. Recheck urine. Heart score is 5. Given heart score and new leukocytosis will consult for admission for further management.    2:45 PM: Spoke with hospitalist, appreciated their time and input. Will admit for cardiac r/o and further management of leukocytosis. Pt endorses improvement in breathing following breathing treatment.    Roxanna Mew, PA-C 10/26/15 [place], MD 10/26/15 2002

## 2015-10-26 NOTE — ED Notes (Signed)
PA at bedside.

## 2015-10-26 NOTE — Care Management Note (Addendum)
Case Management Note  Patient Details  Name: Desiree Parker MRN: [id number] Date of Birth: Jun 12, 1945  Subjective/Objective:  70 y.o. F seen in the ED today for Recurrent CP and swollen ankles. CM consult for Medication assistance as son, at the bedside would like [facility] to assist with supervision of his mother in the home. He shared his concerns that she is noncompliant with her diet, medications, and MD appointments.  She has used [facility] in the past and chooses to have them come back and assist her with daily medication routines. A SW has been added to assist with assessment,  as it appears family dynamics are interfering with Desiree Parker ability to care for herself.  Son appears to be in the  more authoritative place in this relationship. He is working harder in her recovery than she is, preparing healthy meals she does not eat etc...                  Action/Plan: Discharge with [facility] services. Spoke with [employer], [place]. Faxed all referral info (facesheet, f55f and orders to [phone]). No further CM needs.     Expected Discharge Date:                  Expected Discharge Plan:  [place]  In-House Referral:     Discharge planning Services  CM Consult  Post Acute Care Choice:  Home Health Choice offered to:  Patient, Adult Children  DME Arranged:    DME Agency:     HH Arranged:  RN, Social Work [employer] Agency:  [facility]  Status of Service:  Completed, signed off  If discussed at [employer], dates discussed:    Additional Comments:  Desiree Sawyers, RN 10/26/2015, 1:14 PM

## 2015-10-26 NOTE — H&P (Signed)
[facility] Admission History and Physical Service Pager: [phone]  Patient name: Desiree Parker Medical record number: [id number] Date of birth: [birth year] Age: 70 y.o. Gender: female  Primary Care Provider: Lupita Dawn, MD Consultants: None Code Status: DNR  Chief Complaint: Chest pain  Assessment and Plan: Desiree Parker is a 70 y.o. female presenting with Chest pain and shortness of breath. PMH is significant for HTN, HLD, T2DM, COPD, Hallux valgus.  Chest Pain / SOB. Heart score 5 on admission. Concern for cardiac etiology. Initial EKG, troponin, and CXR negative. May have component of COPD given improvement in ED with breathing treatment, though patient does not have any wheezing or increased sputum production typically seen with a COPD flare. Wells score 0. Risk labs checked last month by PCP - will not repeat. Does not appear overloaded - echo last month with normal EF and grade 1 diastolic dysfunction.  - Admit to telemetry - Check trops x3, EKG in AM - Check TSH - duonebs q4hrs prn for possible underlying COPD, would not start steroids or abx at this point as symptoms not entirely consistent with flare - Consult cardiology in AM   Leukocytosis.  WBC 13.7 on admission. Unclear etiology. No fevers or other active signs of infection.  - Trend CBC  UTI. Diagnosed on 7/12. Urine culture with klebsiella.  - Finish course of keflex today (7/16)  HTN / HLD. LDL 143 on lipid panel last month - Continue home enalapril and ASA 81mg   - Increase atorvastatin to 40mg   T2DM.  A1c 6.6 last month. Not on any home medications, recently stopped metformin - SSI - Consider restarting metformin as outpatient given mortality benefits.   COPD. Not on any medications at home.  - consider starting controller medication at discharge.   Bilateral MTP Pain. Patient previously diagnosed with hallux valgus. Left MTP mildly erythematous, but does not appear infected.  Plain films consistent hallux valgus deformity and no findings to suggest inflammatory arthritis. Doubt gout flare given bilateral presentation and plain film findings.  - Tylenol as needed for pain, would avoid NSAIDs until ACS ruled out - Consider checking uric acid level  FEN/GI: HH diet, SLIV Prophylaxis: Lovenox  Disposition: Admitted for observation pending above management.   History of Present Illness:  Desiree Parker is a 70 y.o. female presenting with chest pain and shortness of breath.  Symptoms have been occuring intermittently for at least the last week. Pain is located substernally. Patient is not able to describe if the pain is sharp or dull she "just knows its there." Pain is exertional in nature and can occur with minimal exertional activities such as washing dishes or cleaning the bathroom. Chest pain is associated with shortness of breath. Symptoms improve with rest. Patient denies having similar symptoms in the past. She has mild swelling in her legs bilaterally with mild orthopnea. Patient sleeps on two pillows at night (her baseline). No history of VTE. Patient without chest pain or shortness of breath currently.   No recent illnesses. No fevers or chills. No nausea or vomiting. Recently diagnosed with a UTI and is finishing her course of keflex.   IN the ED have negative troponin, EKG, and CXR. Received breathing treatment in ED which helped some.   Review Of Systems: Per HPI, otherwise the remainder of the systems were negative.  Patient Active Problem List   Diagnosis Date Noted  . Shortness of breath 10/26/2015  . Osteoporosis 10/13/2015  . Unsteady gait  10/03/2015  . Bladder incontinence 10/03/2015  . Chronic diastolic heart failure ([place]) 10/01/2015  . Estrogen deficiency 09/22/2015  . Preventative health care 09/22/2015  . Chest pain 09/11/2015  . Noncompliance with medications 09/11/2015  . Obesity (BMI 30-39.9) 10/30/2013  . Right shoulder pain 06/28/2013   . Emesis 02/05/2013  . Diarrhea 02/05/2013  . Viral bronchitis 08/15/2012  . COPD with asthma ([place]) 10/27/2010  . HALLUX VALGUS 01/13/2010  . TOBACCO USER 03/04/2009  . STRESS INCONTINENCE 02/13/2008  . ANXIETY DISORDER, SITUATIONAL, MILD 08/15/2007  . DEVELOPMENTAL READING DISORDER UNSPECIFIED 07/18/2007  . EXOTROPIA, 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  . Diabetes mellitus, type II ([place]) 06/09/2006  . Hyperlipidemia 06/09/2006  . OBESITY, NOS 06/09/2006  . CATARACT 06/09/2006  . HYPERTENSION, BENIGN SYSTEMIC 06/09/2006  . GASTROESOPHAGEAL REFLUX, NO ESOPHAGITIS 06/09/2006    Past Medical History: Past Medical History  Diagnosis Date  . Alternating exotropia   . Arterial insufficiency (HCC) 02/04/2004    ABI 0.84  . 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 Diabetes mellitus without complication ([place])   . Hypertension     Past Surgical History: Past Surgical History  Procedure Laterality Date  . Tubal ligation  1975  . Cesarean section  1975  . Cholecystectomy  1975    states gallstones removed    Social History: Social History  Substance Use Topics  . Smoking status: Former Smoker -- 2.00 packs/day for 27 years    Types: Cigarettes    Quit date: 09/24/1988  . Smokeless tobacco: Current User    Types: Snuff  . Alcohol Use: 1.0 oz/week    2 Standard drinks or equivalent per week   Additional social history: None  Please also refer to relevant sections of EMR.  Family History: Family History  Problem Relation Age of Onset  . Hypertension Mother   . Hypertension Sister   . Cancer Mother     lung  . Diabetes Mother   . Diabetes Father   . Asthma Son    Allergies and Medications: No Known Allergies No current facility-administered medications on file prior to encounter.   Current Outpatient Prescriptions on File Prior to Encounter  Medication Sig Dispense Refill  . albuterol (PROVENTIL HFA;VENTOLIN HFA) 108 (90 Base) MCG/ACT  inhaler Inhale 2 puffs into the lungs every 6 (six) hours as needed for wheezing. 1 Inhaler 1  . alendronate (FOSAMAX) 70 MG tablet Take 1 tablet (70 mg total) by mouth every 7 (seven) days. Take with a full glass of water on an empty stomach. 4 tablet 11  . aspirin 81 MG EC tablet Take 81 mg by mouth daily.      [name] atorvastatin (LIPITOR) 20 MG tablet Take 1 tablet (20 mg total) by mouth daily. 90 tablet 1  . Calcium Carb-Cholecalciferol (CALCIUM 500+D3) 500-400 MG-UNIT TABS Take 2 tablets by mouth every morning. 180 tablet 1  . cephALEXin (KEFLEX) 500 MG capsule Take 1 capsule (500 mg total) by mouth 4 (four) times daily. 20 capsule 0  . enalapril (VASOTEC) 10 MG tablet Take 1 tablet (10 mg total) by mouth daily. 90 tablet 1  . lidocaine (XYLOCAINE) 5 % ointment Apply 1 application topically as needed. (Patient taking differently: Apply 1 application topically daily as needed for mild pain. ) 45 g 2  . omeprazole (PRILOSEC) 20 MG capsule Take 1 capsule (20 mg total) by mouth daily. 30 capsule 3    Objective: BP 129/79 mmHg  Pulse 87  Temp(Src) 97.8 F (36.6  C) (Oral)  Resp 26  SpO2 97% Exam: General: 70yo female in NAD lying in hospital bed Eyes: PERRL, EOMI ENTM:  MMM Neck: FROM, Supple Cardiovascular: RRR, no murmurs Respiratory: NWOB, CTAB, speaking in full sentences.  Abdomen: +BS, S, NT, ND MSK: WWP, No cyanosis. Trace non-pitting edema in LE to mid shin bilaterally. Bilateral first MTP joints with valgus deformity. Left MTP mildly erythematous and tender to palpation.  Skin: Warm, Dry Neuro: Alert and oriented. No focal deficits Psych: Appropriate mood and affect.   Labs and Imaging: CBC BMET   Recent Labs Lab 10/26/15 1100  WBC 13.7*  HGB 12.3  HCT 35.4*  PLT 277    Recent Labs Lab 10/26/15 1100  NA 133*  K 3.2*  CL 99*  CO2 25  BUN 9  CREATININE 0.85  GLUCOSE 127*  CALCIUM 8.3*     Trop 0.02 BNP 43 Urinalysis    Component Value Date/Time    COLORURINE YELLOW 10/26/2015 1430   APPEARANCEUR CLOUDY* 10/26/2015 1430   LABSPEC 1.014 10/26/2015 1430   PHURINE 6.0 10/26/2015 1430   GLUCOSEU NEGATIVE 10/26/2015 1430   HGBUR TRACE* 10/26/2015 1430   HGBUR trace-intact 02/13/2008 1320   BILIRUBINUR NEGATIVE 10/26/2015 1430   KETONESUR 15* 10/26/2015 1430   PROTEINUR 100* 10/26/2015 1430   UROBILINOGEN 0.2 06/15/2013 2319   NITRITE NEGATIVE 10/26/2015 1430   LEUKOCYTESUR NEGATIVE 10/26/2015 1430   EKG: NSR, no acute ischemic changes.  Dg Chest 2 View  10/26/2015  CLINICAL DATA:  Left-sided chest pain and shortness of breath for 5 days. EXAM: CHEST  2 VIEW COMPARISON:  10/21/2015.  01/27/2013. FINDINGS: Stable asymmetric elevation right hemidiaphragm. No focal airspace consolidation. No pulmonary edema or pleural effusion. Right hilar fullness is stable The visualized bony structures of the thorax are intact. IMPRESSION: No active cardiopulmonary disease. Electronically Signed   By: Misty Stanley M.D.   On: 10/26/2015 11:46   Dg Toe Great Left  10/26/2015  CLINICAL DATA:  Pain and swelling in the great toe bilaterally. EXAM: LEFT GREAT TOE COMPARISON:  Right toe radiographs from the same day. FINDINGS: Moderate osteopenia is present. Hallux valgus deformity is similar to the right foot. There is lateral subluxation of the first MTP joint. No significant erosion is present. No radiopaque foreign body is noted. IMPRESSION: 1. Hallux valgus deformity. 2. Moderate osteopenia. 3. Soft tissue swelling without significant aeration to suggest inflammatory arthritis. Electronically Signed   By: San Morelle M.D.   On: 10/26/2015 15:53   Dg Toe Great Right  10/26/2015  CLINICAL DATA:  Pain. EXAM: RIGHT GREAT TOE COMPARISON:  None. FINDINGS: Marked hallux valgus deformity associated with osteophyte at the first metatarsophalangeal joint. No acute fracture or subluxation. No erosions or soft tissue swelling. IMPRESSION: Marked hallux valgus  deformity. Electronically Signed   By: Nolon Nations M.D.   On: 10/26/2015 15:52   Vivi Barrack, MD 10/26/2015, 2:44 PM PGY-3, [place] Intern pager: [phone], text pages welcome

## 2015-10-26 NOTE — Progress Notes (Signed)
Pt arrived to the unit, VSS and tele obtained. Fall risk high. Placed on O2 at 1L family at the bedside.

## 2015-10-26 NOTE — Progress Notes (Signed)
Pt has been admitted to the hospital in an Observation status. Spoke with Benjamine Mola, who received the referral for this pt at [facility]. They have accepted the referral for this pt and will delay the start of care for this pt until after she is discharged. CM can call Rhett Bannister @ [phone] and make her aware when this pt is ready to be discharged.

## 2015-10-26 NOTE — ED Notes (Signed)
Patient here with recurrent chest pain and now ankle/feet swelling since being seen in ED last Tuesday. Taking keflex currently for uti and thinks related to medication. No distress. Decreased appetite for same

## 2015-10-27 ENCOUNTER — Observation Stay (HOSPITAL_COMMUNITY): Payer: Medicare HMO

## 2015-10-27 ENCOUNTER — Encounter (HOSPITAL_COMMUNITY): Payer: Self-pay | Admitting: Family Medicine

## 2015-10-27 DIAGNOSIS — R918 Other nonspecific abnormal finding of lung field: Secondary | ICD-10-CM | POA: Insufficient documentation

## 2015-10-27 DIAGNOSIS — R222 Localized swelling, mass and lump, trunk: Secondary | ICD-10-CM | POA: Diagnosis not present

## 2015-10-27 DIAGNOSIS — J189 Pneumonia, unspecified organism: Secondary | ICD-10-CM | POA: Diagnosis not present

## 2015-10-27 DIAGNOSIS — R0602 Shortness of breath: Secondary | ICD-10-CM

## 2015-10-27 DIAGNOSIS — M1310 Monoarthritis, not elsewhere classified, unspecified site: Secondary | ICD-10-CM

## 2015-10-27 DIAGNOSIS — R079 Chest pain, unspecified: Secondary | ICD-10-CM | POA: Insufficient documentation

## 2015-10-27 DIAGNOSIS — R0609 Other forms of dyspnea: Secondary | ICD-10-CM | POA: Insufficient documentation

## 2015-10-27 DIAGNOSIS — R0789 Other chest pain: Secondary | ICD-10-CM | POA: Diagnosis not present

## 2015-10-27 DIAGNOSIS — I5032 Chronic diastolic (congestive) heart failure: Secondary | ICD-10-CM | POA: Diagnosis not present

## 2015-10-27 DIAGNOSIS — E669 Obesity, unspecified: Secondary | ICD-10-CM

## 2015-10-27 DIAGNOSIS — M1000 Idiopathic gout, unspecified site: Secondary | ICD-10-CM

## 2015-10-27 DIAGNOSIS — E119 Type 2 diabetes mellitus without complications: Secondary | ICD-10-CM | POA: Diagnosis not present

## 2015-10-27 DIAGNOSIS — M109 Gout, unspecified: Secondary | ICD-10-CM | POA: Diagnosis present

## 2015-10-27 DIAGNOSIS — E785 Hyperlipidemia, unspecified: Secondary | ICD-10-CM | POA: Diagnosis not present

## 2015-10-27 HISTORY — DX: Monoarthritis, not elsewhere classified, unspecified site: M13.10

## 2015-10-27 LAB — BASIC METABOLIC PANEL
Anion gap: 8 (ref 5–15)
BUN: 10 mg/dL (ref 6–20)
CALCIUM: 8 mg/dL — AB (ref 8.9–10.3)
CO2: 28 mmol/L (ref 22–32)
CREATININE: 0.8 mg/dL (ref 0.44–1.00)
Chloride: 100 mmol/L — ABNORMAL LOW (ref 101–111)
GFR calc Af Amer: >60 mL/min (ref >60)
GFR calc non Af Amer: >60 mL/min (ref >60)
GLUCOSE: 114 mg/dL — AB (ref 65–99)
Potassium: 3.5 mmol/L (ref 3.5–5.1)
Sodium: 136 mmol/L (ref 135–145)

## 2015-10-27 LAB — LIPID PANEL
Cholesterol: 98 mg/dL (ref 0–200)
HDL: 40 mg/dL — AB (ref >40)
LDL CALC: 48 mg/dL (ref 0–99)
Total CHOL/HDL Ratio: 2.5 RATIO
Triglycerides: 51 mg/dL (ref <150)
VLDL: 10 mg/dL (ref 0–40)

## 2015-10-27 LAB — GLUCOSE, CAPILLARY
GLUCOSE-CAPILLARY: 117 mg/dL — AB (ref 65–99)
GLUCOSE-CAPILLARY: 134 mg/dL — AB (ref 65–99)
Glucose-Capillary: 198 mg/dL — ABNORMAL HIGH (ref 65–99)
Glucose-Capillary: 166 mg/dL — ABNORMAL HIGH (ref 65–99)

## 2015-10-27 LAB — HEMOGLOBIN A1C
Hgb A1c MFr Bld: 6.5 % — ABNORMAL HIGH (ref 4.8–5.6)
Mean Plasma Glucose: 140 mg/dL

## 2015-10-27 LAB — TROPONIN I: Troponin I: <0.03 ng/mL (ref <0.03)

## 2015-10-27 LAB — CBC
HEMATOCRIT: 36.9 % (ref 36.0–46.0)
Hemoglobin: 12.4 g/dL (ref 12.0–15.0)
MCH: 28.9 pg (ref 26.0–34.0)
MCHC: 33.6 g/dL (ref 30.0–36.0)
MCV: 86 fL (ref 78.0–100.0)
Platelets: 275 10*3/uL (ref 150–400)
RBC: 4.29 MIL/uL (ref 3.87–5.11)
RDW: 12.8 % (ref 11.5–15.5)
WBC: 11.9 10*3/uL — ABNORMAL HIGH (ref 4.0–10.5)

## 2015-10-27 MED ORDER — IOPAMIDOL (ISOVUE-370) INJECTION 76%
INTRAVENOUS | Status: AC
  Administered 2015-10-27: 100 mL
  Filled 2015-10-27: qty 100

## 2015-10-27 MED ORDER — AZITHROMYCIN 250 MG PO TABS
250.0000 mg | ORAL_TABLET | Freq: Every day | ORAL | Status: DC
  Administered 2015-10-28: 250 mg via ORAL
  Filled 2015-10-27 (×2): qty 1

## 2015-10-27 MED ORDER — PREDNISONE 20 MG PO TABS: 40.0000 mg | ORAL_TABLET | Freq: Every day | ORAL | Status: DC

## 2015-10-27 MED ORDER — FLUTICASONE FUROATE-VILANTEROL 200-25 MCG/INH IN AEPB
1.0000 | INHALATION_SPRAY | Freq: Every day | RESPIRATORY_TRACT | Status: DC
  Administered 2015-10-27 – 2015-10-30 (×4): 1 via RESPIRATORY_TRACT
  Filled 2015-10-27: qty 28

## 2015-10-27 MED ORDER — DEXTROSE 5 % IV SOLN
1.0000 g | INTRAVENOUS | Status: DC
  Administered 2015-10-27: 1 g via INTRAVENOUS
  Filled 2015-10-27 (×2): qty 10

## 2015-10-27 MED ORDER — AZITHROMYCIN 500 MG PO TABS
500.0000 mg | ORAL_TABLET | Freq: Every day | ORAL | Status: AC
  Administered 2015-10-27: 500 mg via ORAL
  Filled 2015-10-27: qty 1

## 2015-10-27 MED ORDER — PREDNISONE 20 MG PO TABS
40.0000 mg | ORAL_TABLET | ORAL | Status: AC
  Administered 2015-10-27: 40 mg via ORAL
  Filled 2015-10-27: qty 2

## 2015-10-27 MED ORDER — IPRATROPIUM-ALBUTEROL 0.5-2.5 (3) MG/3ML IN SOLN: 3.0000 mL | Freq: Four times a day (QID) | RESPIRATORY_TRACT | Status: DC

## 2015-10-27 NOTE — Care Management Obs Status (Signed)
[facility] NOTIFICATION   Patient Details  Name: Desiree Parker MRN: [id number] Date of Birth: November 30, 1945   Medicare Observation Status Notification Given:  Yes    Bethena Roys, RN 10/27/2015, 2:03 PM

## 2015-10-27 NOTE — Clinical Social Work Note (Signed)
Clinical Social Work Assessment  Patient Details  Name: Desiree Parker MRN: [id number] Date of Birth: 10-May-1945  Date of referral:  10/27/15               Reason for consult:  Facility Placement, Discharge Planning                Permission sought to share information with:  Facility Sport and exercise psychologist, Family Supports Permission granted to share information::  Yes, Verbal Permission Granted  Name::     Terilyn Sano  Agency::  SNF's  Relationship::  Son  Contact Information:  [phone]  Housing/Transportation Living arrangements for the past 2 months:  Apartment Source of Information:  Patient, Adult Children, Medical Team Patient Interpreter Needed:  None Criminal Activity/Legal Involvement Pertinent to Current Situation/Hospitalization:  No - Comment as needed Significant Relationships:  Adult Children, Siblings Lives with:  Self Do you feel safe going back to the place where you live?  Yes Need for family participation in patient care:  Yes (Comment)  Care giving concerns:  Patient and son are interested in SNF placement. PT yet to evaluate.   Social Worker assessment / plan:  CSW met with patient. CSW introduced role and explained that discharge planning would be discussed. Patient got son on the phone for CSW to speak with. CSW inquired about interest in SNF. Patient's son confirmed with hopes for long-term care following rehab. Explained that patient needs to be seen by PT before SNF can be pursued. Explained that few SNF's accept [facility]. Patient's son expressed understanding. CSW left SNF list for patient and son to review together. CSW provided contact information for when patient's son returns to hospital. No further concerns. CSW encouraged patient and her son to contact CSW as needed. CSW will continue to follow patient and her family and facilitate discharge to SNF once medically stable if appropriate.  Employment status:  Retired Office manager PT Recommendations:  Not assessed at this time Information / Referral to community resources:  [place]  Patient/Family's Response to care:  Patient and her son agreeable to SNF placement. Patient's son supportive and involved in patient's care. Patient and her son appreciated social work intervention.  Patient/Family's Understanding of and Emotional Response to Diagnosis, Current Treatment, and Prognosis:  Patient and her son understand possible need for rehab following hospitalization. Patient's son is hopeful for long-term care following rehab due to medical conditions.  Emotional Assessment Appearance:  Appears stated age Attitude/Demeanor/Rapport:   (Pleasant) Affect (typically observed):  Accepting, Appropriate, Calm, Pleasant Orientation:  Oriented to Self, Oriented to Place, Oriented to  Time, Oriented to Situation Alcohol / Substance use:  Tobacco Use Psych involvement (Current and /or in the community):  No (Comment)  Discharge Needs  Concerns to be addressed:  Care Coordination Readmission within the last 30 days:  No Current discharge risk:  Lives alone Barriers to Discharge:  [place], LCSW 10/27/2015, 2:57 PM

## 2015-10-27 NOTE — Progress Notes (Signed)
Family Medicine Teaching Service Daily Progress Note Intern Pager: [phone]  Patient name: Desiree Parker Medical record number: [id number] Date of birth: 25-Feb-1946 Age: 70 y.o. Gender: female  Primary Care Provider: Lupita Dawn, MD Consultants: Cardiology Code Status: DNR  Pt Overview and Major Events to Date:  CXR, EKG, Troponin trending, breathing treat, right and left toe x ray  Assessment and Plan: Desiree Parker is a 70 y.o. female presenting with Chest pain and shortness of breath. PMH is significant for HTN, HLD, T2DM, COPD, Hallux valgus  #Chest Pain / SOB.  EKG this morning NSR. TSH within normal limit 3.024. No finding on telemetry overnight. Troponin x3 (0.03, 0.03,0.03). Heart score 5 on admission. Concern for cardiac etiology. Initial EKG, troponin, and CXR negative. May have component of COPD given improvement in ED with breathing treatment, though patient does not have any wheezing or increased sputum production typically seen with a COPD flare. Wells score 0. Risk labs checked last month by PCP - will not repeat. Does not appear overloaded - echo last month with normal EF and grade 1 diastolic dysfunction.  -- On telemetry -- EKG in AM - duonebs q4hrs prn for possible underlying COPD, would not start steroids or abx at this point as symptoms not entirely consistent with flare --Consult cardiology in AM --Wean off oxygen as tolerated   #Leukocytosis. WBC 13.7 on admission, down to 11.9 this morning . Unclear etiology. No fevers or other active signs of infection.  --Continue to trend CBC  #UTI. Diagnosed on 7/12. Urine culture with klebsiella. No urinary symptoms. - Finish course of keflex today (7/16)  #HTN / HLD. LDL 143 on lipid panel last month. This admission LDL 48, HDL 40, T. Cholesterol 98, triglycerides 51. - Continue home enalapril and ASA 81mg   - Increase atorvastatin to 40mg   #T2DM. A1c 6.6 last month. Not on any home medications, recently  stopped metformin. Continue to be within normal while inpatient. - SSI - Consider restarting metformin as outpatient given mortality benefits.   #COPD. Not on any medications at home. On 2L [place], sating well.  - consider starting controller medication at discharge.   #Bilateral MTP Pain. Patient previously diagnosed with hallux valgus. Left MTP mildly erythematous, but does not appear infected. Plain films consistent hallux valgus deformity and no findings to suggest inflammatory arthritis. Doubt gout flare given bilateral presentation and plain film findings.  --Tylenol as needed for pain, would avoid NSAIDs until ACS ruled out -- Consider checking uric acid level  FEN/GI: Heart Healthy, SLIV PPx: Lovenox  Disposition: Observation pending further  cardiac evaluation  Subjective:  Patient had a good night, endorses some cough but denies chest pain. SOB seem to be stable. Patient does not have much of a appetite this morning.  Objective: Temp:  [97.8 F (36.6 C)-100.2 F (37.9 C)] 98.4 F (36.9 C) (07/17 0527) Pulse Rate:  [78-99] 81 (07/17 0527) Resp:  [18-26] 20 (07/17 0527) BP: (104-148)/(53-81) 148/62 mmHg (07/17 0527) SpO2:  [93 %-100 %] 100 % (07/17 0527) Weight:  [191 lb 14.4 oz (87.045 kg)] 191 lb 14.4 oz (87.045 kg) (07/17 0527) Physical Exam: General: 70yo female in NAD lying in hospital bed Eyes: PERRL, EOMI ENTM: MMM Neck: FROM, Supple Cardiovascular: RRR, no murmurs Respiratory: NWOB, CTAB, speaking in full sentences.  Abdomen: +BS, S, NT, ND MSK: WWP, No cyanosis. Trace non-pitting edema in LE to mid shin bilaterally. Bilateral first MTP joints with valgus deformity. Left MTP mildly erythematous and tender to  palpation.  Skin: Warm, Dry Neuro: Alert and oriented. No focal deficits Psych: Appropriate mood and affect.   Laboratory:  Recent Labs Lab 10/21/15 2056 10/26/15 1100 10/27/15 0437  WBC 5.4 13.7* 11.9*  HGB 13.5 12.3 12.4  HCT 39.7 35.4* 36.9   PLT 245 277 275    Recent Labs Lab 10/21/15 2056 10/26/15 1100 10/27/15 0437  NA 138 133* 136  K 3.2* 3.2* 3.5  CL 104 99* 100*  CO2 27 25 28   BUN 8 9 10   CREATININE 0.77 0.85 0.80  CALCIUM 8.5* 8.3* 8.0*  PROT  --  7.4  --   BILITOT  --  1.7*  --   ALKPHOS  --  99  --   ALT  --  43  --   AST  --  66*  --   GLUCOSE 133* 127* 114*     Imaging/Diagnostic Tests: Dg Chest 2 View  10/26/2015  CLINICAL DATA:  Left-sided chest pain and shortness of breath for 5 days. EXAM: CHEST  2 VIEW COMPARISON:  10/21/2015.  01/27/2013. FINDINGS: Stable asymmetric elevation right hemidiaphragm. No focal airspace consolidation. No pulmonary edema or pleural effusion. Right hilar fullness is stable The visualized bony structures of the thorax are intact. IMPRESSION: No active cardiopulmonary disease. Electronically Signed   By: Misty Stanley M.D.   On: 10/26/2015 11:46   Dg Toe Great Left  10/26/2015  CLINICAL DATA:  Pain and swelling in the great toe bilaterally. EXAM: LEFT GREAT TOE COMPARISON:  Right toe radiographs from the same day. FINDINGS: Moderate osteopenia is present. Hallux valgus deformity is similar to the right foot. There is lateral subluxation of the first MTP joint. No significant erosion is present. No radiopaque foreign body is noted. IMPRESSION: 1. Hallux valgus deformity. 2. Moderate osteopenia. 3. Soft tissue swelling without significant aeration to suggest inflammatory arthritis. Electronically Signed   By: San Morelle M.D.   On: 10/26/2015 15:53   Dg Toe Great Right  10/26/2015  CLINICAL DATA:  Pain. EXAM: RIGHT GREAT TOE COMPARISON:  None. FINDINGS: Marked hallux valgus deformity associated with osteophyte at the first metatarsophalangeal joint. No acute fracture or subluxation. No erosions or soft tissue swelling. IMPRESSION: Marked hallux valgus deformity. Electronically Signed   By: Nolon Nations M.D.   On: 10/26/2015 15:52     Marjie Skiff, MD 10/27/2015,  6:54 AM PGY-1, [place] Intern pager: [phone], text pages welcome

## 2015-10-27 NOTE — Consult Note (Signed)
CARDIOLOGY CONSULT NOTE       Patient ID: HEART SCHREITER MRN: [id number] DOB/AGE: [birth year] 70 y.o.  Admit date: 10/26/2015 Referring Physician:  McDiarmid Primary Physician: Lupita Dawn, MD Primary Cardiologist:  New Reason for Consultation: Chest Pain  Active Problems:   Chest pain   Shortness of breath   Monoarticular arthritis   HPI:  70 y.o. with DM, HTN, previous smoker quit in 1990.  Increasing failure to thrive at home Most of history from son. More lethargic and dyspnea.  Compliant with meds Unable to care for self and do much at home last 5 days. Seen by Dr Ree Kida 10/03/15 complained of atypical sharp SSCP which was thought to be MSK in etiology Echo ordered And reviewed. Normal EF no significant valve Dx and no pulmonary hypertension In hospital lethargic dyspnea some relief with nebulizer. Sharp pain in chest at rest and with moving Not pleuritic lasts seconds/ minutes. Not having pain currently   Echo : 10/01/15 Study Conclusions  - Left ventricle: The cavity size was normal. There was mild  concentric hypertrophy. Systolic function was normal. The  estimated ejection fraction was in the range of 55% to 60%. Wall  motion was normal; there were no regional wall motion  abnormalities. Doppler parameters are consistent with abnormal  left ventricular relaxation (grade 1 diastolic dysfunction).  Doppler parameters are consistent with indeterminate ventricular  filling pressure. - Aortic valve: Transvalvular velocity was within the normal range.  There was no stenosis. There was no regurgitation. - Mitral valve: Transvalvular velocity was within the normal range.  There was no evidence for stenosis. There was trivial  regurgitation. - Right ventricle: The cavity size was normal. Wall thickness was  normal. Systolic function was normal. - Tricuspid valve: There was mild regurgitation.  ROS All other systems reviewed and negative except as noted  above  Past Medical History  Diagnosis Date  . Alternating exotropia   . Arterial insufficiency (HCC) 02/04/2004    ABI 0.84  . 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 Diabetes mellitus without complication ([place])   . Hypertension   . 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 Hypertension Mother   . Hypertension Sister   . Cancer Mother     lung  . Diabetes Mother   . Diabetes Father   . Asthma Son     Social History   Social History  . Marital Status: Single    Spouse Name: N/A  . Number of Children: 1  . Years of Education: 12   Occupational History  . Not on file.   Social History Main Topics  . Smoking status: Former Smoker -- 2.00 packs/day for 27 years    Types: Cigarettes    Quit date: 09/24/1988  . Smokeless tobacco: Current User    Types: Snuff  . Alcohol Use: 1.0 oz/week    2 Standard drinks or equivalent per week  . Drug Use: No  . Sexual Activity: Not Currently    Birth Control/ Protection: Surgical   Other Topics Concern  . Not on file   Social History Narrative   Lives alone, retired Geophysicist/field seismologist   Son, [employer] in [place]             Past Surgical History  Procedure Laterality Date  . Tubal ligation  1975  . Cesarean section  1975  . Cholecystectomy  1975    states gallstones  removed     . aspirin EC  81 mg Oral Daily  . atorvastatin  40 mg Oral Daily  . enalapril  10 mg Oral Daily  . enoxaparin (LOVENOX) injection  40 mg Subcutaneous Q24H  . fluticasone furoate-vilanterol  1 puff Inhalation Daily  . insulin aspart  0-15 Units Subcutaneous TID WC  . insulin aspart  0-5 Units Subcutaneous QHS  . iopamidol      . pantoprazole  40 mg Oral Daily  . [START ON 11/02/2015] predniSONE  40 mg Oral QAC breakfast      Physical Exam: Blood pressure 147/58, pulse 90, temperature 97.7 F (36.5 C), temperature source Oral, resp. rate 20, weight 191 lb 14.4 oz  (87.045 kg), SpO2 97 %.   Lethargic flat affect  Obese black female  HEENT: Right eye esotropia  Neck supple with no adenopathy JVP normal no bruits no thyromegaly Lungs cough rhonchi poor inspitory effort expitratory wheezing and good diaphragmatic motion Heart:  S1/S2 no murmur, no rub, gallop or click PMI normal Abdomen: benighn, BS positve, no tenderness, no AAA no bruit.  No HSM or HJR Distal pulses intact with no bruits No edema Neuro non-focal Skin warm and dry No muscular weakness   Labs:   Lab Results  Component Value Date   WBC 11.9* 10/27/2015   HGB 12.4 10/27/2015   HCT 36.9 10/27/2015   MCV 86.0 10/27/2015   PLT 275 10/27/2015    Recent Labs Lab 10/26/15 1100 10/27/15 0437  NA 133* 136  K 3.2* 3.5  CL 99* 100*  CO2 25 28  BUN 9 10  CREATININE 0.85 0.80  CALCIUM 8.3* 8.0*  PROT 7.4  --   BILITOT 1.7*  --   ALKPHOS 99  --   ALT 43  --   AST 66*  --   GLUCOSE 127* 114*   Lab Results  Component Value Date   TROPONINI <0.03 10/27/2015    Lab Results  Component Value Date   CHOL 98 10/27/2015   CHOL 221* 09/11/2015   CHOL 216* 01/29/2010   Lab Results  Component Value Date   HDL 40* 10/27/2015   HDL 59 09/11/2015   HDL 66 01/29/2010   Lab Results  Component Value Date   LDLCALC 48 10/27/2015   LDLCALC 143* 09/11/2015   LDLCALC 125* 01/29/2010   Lab Results  Component Value Date   TRIG 51 10/27/2015   TRIG 97 09/11/2015   TRIG 126 01/29/2010   Lab Results  Component Value Date   CHOLHDL 2.5 10/27/2015   CHOLHDL 3.7 09/11/2015   CHOLHDL 3.3 Ratio 01/29/2010   Lab Results  Component Value Date   LDLDIRECT 132* 08/01/2012   LDLDIRECT 127* 07/27/2011   LDLDIRECT 121* 10/27/2010      Radiology: Dg Chest 2 View  10/26/2015  CLINICAL DATA:  Left-sided chest pain and shortness of breath for 5 days. EXAM: CHEST  2 VIEW COMPARISON:  10/21/2015.  01/27/2013. FINDINGS: Stable asymmetric elevation right hemidiaphragm. No focal  airspace consolidation. No pulmonary edema or pleural effusion. Right hilar fullness is stable The visualized bony structures of the thorax are intact. IMPRESSION: No active cardiopulmonary disease. Electronically Signed   By: Misty Stanley M.D.   On: 10/26/2015 11:46   Dg Chest 2 View  10/21/2015  CLINICAL DATA:  Chest pain, shortness of breath EXAM: CHEST  2 VIEW COMPARISON:  06/03/2009 FINDINGS: There is bilateral mild perihilar interstitial thickening. There is no focal consolidation. There is no pleural effusion  or pneumothorax. The heart and mediastinal contours are unremarkable. The osseous structures are unremarkable. IMPRESSION: Mild bilateral perihilar interstitial thickening as can be seen with bronchitis. Electronically Signed   By: Kathreen Devoid   On: 10/21/2015 22:38   Dg Bone Density  10/09/2015  EXAM: DUAL X-RAY ABSORPTIOMETRY (DXA) FOR BONE MINERAL DENSITY IMPRESSION: Referring Physician:  Lupita Dawn PATIENT: Name: Eniah, Guyot Patient ID: [id number] Birth Date: 1945-05-29 Height: 60.0 in. Sex: Female Measured: 10/09/2015 Weight: 204.0 lbs. Indications: Albuterol, Estrogen Deficient, Hx of tobacco use, Low Calcium Intake (269.3), Omeprazole, Postmenopausal Fractures: None Treatments: None ASSESSMENT: The BMD measured at AP Spine L1-L2 is 0.872 g/cm2 with a T-score of -2.5. This patient's diagnostic category is considered OSTEOPOROSIS according to [facility]) criteria. L-3, L-4 were excluded due to degenerative changes. Site Region Measured Date Measured Age YA BMD Significant CHANGE T-score AP Spine  L1-L2      10[age]09/2015    68.8         -2.5    0.872 g/cm2 DualFemur Neck Right 10/09/2015    68.8         -0.9    0.906 g/cm2 World Health Organization [facility]) criteria for post-menopausal, Caucasian Women: Normal       T-score at or above -1 SD Osteopenia   T-score between -1 and -2.5 SD Osteoporosis T-score at or below -2.5 SD RECOMMENDATION: [place] recommends that FDA-approved medical therapies be considered in postmenopausal women and men age 61 or older with a: 1. Hip or vertebral (clinical or morphometric) fracture. 2. T-score of <-2.5 at the spine or hip. 3. Ten-year fracture probability by FRAX of 3% or greater for hip fracture or 20% or greater for major osteoporotic fracture. All treatment decisions require clinical judgment and consideration of individual patient factors, including patient preferences, co-morbidities, previous drug use, risk factors not captured in the FRAX model (e.g. falls, vitamin D deficiency, increased bone turnover, interval significant decline in bone density) and possible under - or over-estimation of fracture risk by FRAX. All patients should ensure an adequate intake of dietary calcium (1200 mg/d) and vitamin D (800 IU daily) unless contraindicated. FOLLOW-UP: People with diagnosed cases of osteoporosis or at high risk for fracture should have regular bone mineral density tests. For patients eligible for Medicare, routine testing is allowed once every 2 years. The testing frequency can be increased to one year for patients who have rapidly progressing disease, those who are receiving or discontinuing medical therapy to restore bone mass, or have additional risk factors. I have reviewed this report, and agree with the above findings. [facility] Electronically Signed   By: Margarette Canada M.D.   On: 10/09/2015 13:41   Dg Toe Great Left  10/26/2015  CLINICAL DATA:  Pain and swelling in the great toe bilaterally. EXAM: LEFT GREAT TOE COMPARISON:  Right toe radiographs from the same day. FINDINGS: Moderate osteopenia is present. Hallux valgus deformity is similar to the right foot. There is lateral subluxation of the first MTP joint. No significant erosion is present. No radiopaque foreign body is noted. IMPRESSION: 1. Hallux valgus deformity. 2. Moderate osteopenia. 3. Soft tissue swelling without significant  aeration to suggest inflammatory arthritis. Electronically Signed   By: San Morelle M.D.   On: 10/26/2015 15:53   Dg Toe Great Right  10/26/2015  CLINICAL DATA:  Pain. EXAM: RIGHT GREAT TOE COMPARISON:  None. FINDINGS: Marked hallux valgus deformity associated with osteophyte at the first metatarsophalangeal joint. No  acute fracture or subluxation. No erosions or soft tissue swelling. IMPRESSION: Marked hallux valgus deformity. Electronically Signed   By: Nolon Nations M.D.   On: 10/26/2015 15:52   Mm Digital Screening Bilateral  10/10/2015  CLINICAL DATA:  Screening. EXAM: DIGITAL SCREENING BILATERAL MAMMOGRAM WITH CAD COMPARISON:  Previous exam(s). ACR Breast Density Category a: The breast tissue is almost entirely fatty. FINDINGS: There are no findings suspicious for malignancy. Images were processed with CAD. IMPRESSION: No mammographic evidence of malignancy. A result letter of this screening mammogram will be mailed directly to the patient. RECOMMENDATION: Screening mammogram in one year. (Code:SM-B-01Y) BI-RADS CATEGORY  1: Negative. Electronically Signed   By: Franki Cabot M.D.   On: 10/10/2015 15:09    EKG:SR LAD poor R wave progression no acute changes    ASSESSMENT AND PLAN:  Chest Pain: atypical with normal echo , negative troponin and no acute ECG changes. Not in any shape to have myovue even with lexiscan. Consider outpatient Stress testing once other medical issues resolved Dyspnea: Previous smoker , normal EF by echo BNP normal CXR with ? Bronchitis normal mediastinum CTA pending continue inhaler RX and steroids Hallux Valgus:  Right foot NSAI"s consider ortho f/u  HTN:  Well controlled.  Continue current medications and low sodium Dash type diet.   Chol:  On statin  Lab Results  Component Value Date   LDLCALC 48 10/27/2015   DM:  Discussed low carb diet.  Target hemoglobin A1c is 6.5 or less.  Continue current medications.  Will likely need social service  consult and placement once acute medical issues sorted through   Signed: Jenkins Rouge 10/27/2015, 11:22 AM

## 2015-10-27 NOTE — Care Management Note (Signed)
Case Management Note  Patient Details  Name: Desiree Parker MRN: [id number] Date of Birth: [birth year]  Subjective/Objective:    Pt presents with SOB. Pt is from home. At the time of meeting son is at bedside and has questions in regard to SNF placement. Pt has [employer] and should be able to go to SNF with Observation Status. Looking back at notes, pt was set up with [facility] with previous CM.    Action/Plan: CM did speak with CSW in regards to disposition needs. CM will continue to monitor for additional needs.    Expected Discharge Date:                  Expected Discharge Plan:  [place]  In-House Referral:     Discharge planning Services  CM Consult  Post Acute Care Choice:  Home Health Choice offered to:  Patient, Adult Children  DME Arranged:    DME Agency:     HH Arranged:  RN, Social Work [employer] Agency:  [facility]  Status of Service:  Completed, signed off  If discussed at [employer], dates discussed:    Additional Comments:  Bethena Roys, RN 10/27/2015, 1:59 PM

## 2015-10-28 DIAGNOSIS — J45909 Unspecified asthma, uncomplicated: Secondary | ICD-10-CM

## 2015-10-28 DIAGNOSIS — J189 Pneumonia, unspecified organism: Secondary | ICD-10-CM

## 2015-10-28 DIAGNOSIS — J449 Chronic obstructive pulmonary disease, unspecified: Secondary | ICD-10-CM | POA: Diagnosis not present

## 2015-10-28 DIAGNOSIS — R0602 Shortness of breath: Secondary | ICD-10-CM | POA: Diagnosis not present

## 2015-10-28 DIAGNOSIS — R0789 Other chest pain: Secondary | ICD-10-CM | POA: Diagnosis not present

## 2015-10-28 DIAGNOSIS — R0609 Other forms of dyspnea: Secondary | ICD-10-CM | POA: Diagnosis not present

## 2015-10-28 DIAGNOSIS — R222 Localized swelling, mass and lump, trunk: Secondary | ICD-10-CM | POA: Diagnosis not present

## 2015-10-28 DIAGNOSIS — R079 Chest pain, unspecified: Secondary | ICD-10-CM | POA: Diagnosis not present

## 2015-10-28 LAB — GLUCOSE, CAPILLARY
GLUCOSE-CAPILLARY: 112 mg/dL — AB (ref 65–99)
Glucose-Capillary: 118 mg/dL — ABNORMAL HIGH (ref 65–99)
Glucose-Capillary: 162 mg/dL — ABNORMAL HIGH (ref 65–99)
Glucose-Capillary: 125 mg/dL — ABNORMAL HIGH (ref 65–99)

## 2015-10-28 LAB — URIC ACID: URIC ACID, SERUM: 4.9 mg/dL (ref 2.3–6.6)

## 2015-10-28 MED ORDER — LEVOFLOXACIN 750 MG PO TABS
750.0000 mg | ORAL_TABLET | Freq: Every day | ORAL | Status: DC
  Administered 2015-10-28 – 2015-10-30 (×3): 750 mg via ORAL
  Filled 2015-10-28 (×3): qty 1

## 2015-10-28 NOTE — Clinical Social Work Note (Addendum)
CSW met with patient. Son and another support at bedside. Discussed SNF options and insurance barriers. Patient does not want to go to SNF but is willing to if it will help her become more mobile. Patient's son is concerned about decision making capabilities. Discussed possible psych consult for capacity. Patient's son agreeable. MD paged and he said he would speak with patient himself.  Dayton Scrape, CSW [phone]  1:42 pm PASRR: [phone] [place], [place]

## 2015-10-28 NOTE — [place] FL2 (Signed)
[place] MEDICAID FL2 LEVEL OF CARE SCREENING TOOL     IDENTIFICATION  Patient Name: Desiree Parker Birthdate: Aug 16, 1945 Sex: female Admission Date (Current Location): 10/26/2015  [facility] [place] and [place] Number:  Herbalist and Address:  [facility] [place], [place] [address], [place], [place] [address]      Provider Number: [id number]  Attending Physician Name and Address:  Blane Ohara McDiarmid, MD  Relative Name and Phone Number:       Current Level of Care: Hospital Recommended Level of Care: [place] Prior Approval Number:    Date Approved/Denied:   PASRR Number: Pending  Discharge Plan: SNF    Current Diagnoses: Patient Active Problem List   Diagnosis Date Noted  . 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 Podagra 10/27/2015  . Pain in the chest   . Hilar mass   . CAP (community acquired pneumonia)   . Dyspnea on exertion   . Shortness of breath 10/26/2015  . Osteoporosis 10/13/2015  . Unsteady gait 10/03/2015  . Bladder incontinence 10/03/2015  . Chronic diastolic heart failure ([place]) 10/01/2015  . Estrogen deficiency 09/22/2015  . Preventative health care 09/22/2015  . Chest pain 09/11/2015  . Noncompliance with medications 09/11/2015  . Obesity (BMI 30-39.9) 10/30/2013  . Right shoulder pain 06/28/2013  . Emesis 02/05/2013  . Diarrhea 02/05/2013  . Viral bronchitis 08/15/2012  . COPD with asthma ([place]) 10/27/2010  . HALLUX VALGUS 01/13/2010  . TOBACCO USER 03/04/2009  . STRESS INCONTINENCE 02/13/2008  . ANXIETY DISORDER, SITUATIONAL, MILD 08/15/2007  . DEVELOPMENTAL READING DISORDER UNSPECIFIED 07/18/2007  . EXOTROPIA, 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  . Diabetes mellitus, type II ([place]) 06/09/2006  . Hyperlipidemia 06/09/2006  . OBESITY, NOS 06/09/2006  . CATARACT 06/09/2006  . HYPERTENSION, BENIGN SYSTEMIC 06/09/2006  . GASTROESOPHAGEAL REFLUX, NO ESOPHAGITIS 06/09/2006    Orientation RESPIRATION BLADDER Height & Weight      Self, Time, Situation, Place  Normal Continent Weight: 197 lb 8.5 oz (89.6 kg) Height:     BEHAVIORAL SYMPTOMS/MOOD NEUROLOGICAL BOWEL NUTRITION STATUS   (None)  (None) Continent Diet (Heart healthy)  AMBULATORY STATUS COMMUNICATION OF NEEDS Skin   Limited Assist Verbally Normal                       Personal Care Assistance Level of Assistance  Bathing, Feeding, Dressing Bathing Assistance: Limited assistance Feeding assistance: Independent Dressing Assistance: Limited assistance     Functional Limitations Info  Sight, Hearing, Speech Sight Info: Adequate Hearing Info: Adequate Speech Info: Adequate    SPECIAL CARE FACTORS FREQUENCY  PT (By licensed PT), OT (By licensed OT), Blood pressure, Diabetic urine testing     PT Frequency: 5 x week OT Frequency: 5 x week            Contractures Contractures Info: Not present    Additional Factors Info  Code Status, Allergies, Psychotropic Code Status Info: DNR Allergies Info: NKDA Psychotropic Info: Mild situational anxiety         Current Medications (10/28/2015):  This is the current hospital active medication list Current Facility-Administered Medications  Medication Dose Route Frequency Provider Last Rate Last Dose  . acetaminophen (TYLENOL) tablet 650 mg  650 mg Oral Q4H PRN Vivi Barrack, MD   650 mg at 10/26/15 2114  . aspirin EC tablet 81 mg  81 mg Oral Daily Vivi Barrack, MD   81 mg at 10/28/15 1010  . atorvastatin (LIPITOR) tablet 40 mg  40  mg Oral Daily Vivi Barrack, MD   40 mg at 10/28/15 1010  . enalapril (VASOTEC) tablet 10 mg  10 mg Oral Daily Vivi Barrack, MD   10 mg at 10/28/15 1010  . enoxaparin (LOVENOX) injection 40 mg  40 mg Subcutaneous Q24H Vivi Barrack, MD   40 mg at 10/27/15 2108  . fluticasone furoate-vilanterol (BREO ELLIPTA) 200-25 MCG/INH 1 puff  1 puff Inhalation Daily Blane Ohara McDiarmid, MD   1 puff at 10/28/15 0920  . gi cocktail (Maalox,Lidocaine,Donnatal)  30 mL Oral QID PRN  Vivi Barrack, MD      . insulin aspart (novoLOG) injection 0-15 Units  0-15 Units Subcutaneous TID WC Vivi Barrack, MD   3 Units at 10/27/15 1726  . insulin aspart (novoLOG) injection 0-5 Units  0-5 Units Subcutaneous QHS Vivi Barrack, MD   0 Units at 10/26/15 2331  . ipratropium-albuterol (DUONEB) 0.5-2.5 (3) MG/3ML nebulizer solution 3 mL  3 mL Nebulization Q4H PRN Vivi Barrack, MD   3 mL at 10/26/15 2137  . levofloxacin (LEVAQUIN) tablet 750 mg  750 mg Oral Daily Vivi Barrack, MD      . ondansetron [facility]) injection 4 mg  4 mg Intravenous Q6H PRN Vivi Barrack, MD      . pantoprazole (PROTONIX) EC tablet 40 mg  40 mg Oral Daily Vivi Barrack, MD   40 mg at 10/28/15 1010  . [START ON 11/02/2015] predniSONE (DELTASONE) tablet 40 mg  40 mg Oral QAC breakfast Blane Ohara McDiarmid, MD         Discharge Medications: Please see discharge summary for a list of discharge medications.  Relevant Imaging Results:  Relevant Lab Results:   Additional Information SS#: [id number]  Candie Chroman, LCSW

## 2015-10-28 NOTE — Clinical Social Work Placement (Signed)
   CLINICAL SOCIAL WORK PLACEMENT  NOTE  Date:  10/28/2015  Patient Details  Name: Desiree Parker MRN: [id number] Date of Birth: December 30, 1945  Clinical Social Work is seeking post-discharge placement for this patient at the [place] level of care (*CSW will initial, date and re-position this form in  chart as items are completed):  Yes   Patient/family provided with [facility] Work Department's list of facilities offering this level of care within the geographic area requested by the patient (or if unable, by the patient's family).  Yes   Patient/family informed of their freedom to choose among providers that offer the needed level of care, that participate in Medicare, Medicaid or managed care program needed by the patient, have an available bed and are willing to accept the patient.  Yes   Patient/family informed of [facility]'s ownership interest in [facility] and [facility], as well as of the fact that they are under no obligation to receive care at these facilities.  PASRR submitted to EDS on 10/28/15     PASRR number received on       Existing PASRR number confirmed on       FL2 transmitted to all facilities in geographic area requested by pt/family on 10/28/15     FL2 transmitted to all facilities within larger geographic area on       Patient informed that his/her managed care company has contracts with or will negotiate with certain facilities, including the following:            Patient/family informed of bed offers received.  Patient chooses bed at       Physician recommends and patient chooses bed at      Patient to be transferred to   on  .  Patient to be transferred to facility by       Patient family notified on   of transfer.  Name of family member notified:        PHYSICIAN Please sign FL2, Please sign DNR     Additional Comment:    _______________________________________________ Candie Chroman,  LCSW 10/28/2015, 12:33 PM

## 2015-10-28 NOTE — Evaluation (Signed)
Physical Therapy Evaluation Patient Details Name: AMILIYAH DELMONACO MRN: [id number] DOB: 1945-07-20 Today's Date: 10/28/2015   History of Present Illness  70 y.o. female presenting with Chest pain and shortness of breath. CT chest negative for PE; +consolidated pna PMH is significant for HTN, HLD, T2DM, COPD, Hallux valgus.    Clinical Impression  Pt admitted with above diagnosis. Per son (and pt reluctantly agrees), she has had declining strength and function. She admits to near inability to get up from her couch. States her feet have been very swollen and have gone down while hospitalized. Son also feels pt's inability/unwillingness to take her medicines correctly has contributed to her medical decline and feels further education re: meds is needed.  Anticipate PT is seeing patient "at her best" medically after 2 days of hospitalization, which certainly impacts her functional status. Agree patient can benefit from further therapy and a more controlled environment to simultaneously provide medical supervision and intensive education. Pt currently with functional limitations due to the deficits listed below (see PT Problem List).  Pt will benefit from skilled PT to increase their independence and safety with mobility to allow discharge to the venue listed below.       Follow Up Recommendations SNF;Supervision/Assistance - 24 hour (needs assist with medications per son; rec PT 5X/wk)    Equipment Recommendations  None recommended by PT    Recommendations for Other Services OT consult     Precautions / Restrictions Precautions Precautions: Fall      Mobility  Bed Mobility Overal bed mobility: Modified Independent             General bed mobility comments: incr time and effort to come to sitting and scoot to get feet on the floor; she admits at home she had days where it was even more difficult to get up  Transfers Overall transfer level: Needs assistance Equipment used: Rolling  walker (2 wheeled) Transfers: Sit to/from Stand Sit to Stand: Min guard         General transfer comment: initial vc for safe use of RW/hand placement due to pt is accustomed to rollator with brakes; minguard for safety   Ambulation/Gait Ambulation/Gait assistance: Min guard Ambulation Distance (Feet): 60 Feet Assistive device: Rolling walker (2 wheeled) Gait Pattern/deviations: Step-to pattern;Decreased stride length;Antalgic;Wide base of support;Trunk flexed Gait velocity: very slow Gait velocity interpretation: Below normal speed for age/gender General Gait Details: instructed in step-to pattern to off-load Lt foot as pt with significantly antalgic gait; UE weakness (and RW too tall for her at it's lowest) caused poor offloading; required frequent cues for upright posture as pt tends to stop to rest and lean forward on forearms on RW hand grips  Stairs            Wheelchair Mobility    Modified Rankin (Stroke Patients Only)       Balance Overall balance assessment: Needs assistance Sitting-balance support: No upper extremity supported;Feet unsupported Sitting balance-Leahy Scale: Good Sitting balance - Comments: able to reach forward to pull heel of slipper fully on   Standing balance support: No upper extremity supported Standing balance-Leahy Scale: Poor Standing balance comment: greater than normal sway with static standing and no UE support and feet >  shoulder width; eyes closed with slight incr sway and maintained only 7 seconds before large sway to her Rt, opened eyes and reached for RUE support             High level balance activites:  (unable to assess  due to impaired gait and decr velocity)               Pertinent Vitals/Pain SaO2 96% on 2L on arrival; 95% on RA During ambulation varied 91-95% on RA HR 83-95  Pain Assessment: 0-10 Pain Score: 3  Pain Location: Lt foot> Rt  Pain Descriptors / Indicators: Aching;Guarding Pain  Intervention(s): Limited activity within patient's tolerance;Monitored during session;Other (comment) (educated in use of RW to offload)    Home Living Family/patient expects to be discharged to:: Skilled nursing facility                      Prior Function Level of Independence: Needs assistance   Gait / Transfers Assistance Needed: began using rollator full-time (except at church!?) one month ago; began using rollator when going outside ~1 yr ago due to leg fatigue; denies h/o falls  ADL's / Homemaking Assistance Needed: assist for all community needs (she does not drive; does not have stamina to get groceries); uses rollator to carry items in kitchen        Hand Dominance   Dominant Hand: Right    Extremity/Trunk Assessment   Upper Extremity Assessment: Defer to OT evaluation           Lower Extremity Assessment: Generalized weakness      Cervical / Trunk Assessment: Kyphotic  Communication   Communication: No difficulties  Cognition Arousal/Alertness: Awake/alert Behavior During Therapy: WFL for tasks assessed/performed Overall Cognitive Status: Within Functional Limits for tasks assessed (son present cuing pt "now tell the truth" as pt evasive )                      General Comments General comments (skin integrity, edema, etc.): Son present throughout evaluation. Patient clearly evasive with answers at times.     Exercises        Assessment/Plan    PT Assessment Patient needs continued PT services  PT Diagnosis Difficulty walking;Generalized weakness;Acute pain   PT Problem List Decreased strength;Decreased activity tolerance;Decreased balance;Decreased mobility;Decreased knowledge of use of DME;Decreased safety awareness;Cardiopulmonary status limiting activity;Obesity;Pain  PT Treatment Interventions DME instruction;Gait training;Functional mobility training;Therapeutic activities;Therapeutic exercise;Balance training;Patient/family  education   PT Goals (Current goals can be found in the Care Plan section) Acute Rehab PT Goals Patient Stated Goal: be able to walk safely at church without RW; be able to have energy to go out to eat PT Goal Formulation: With patient Time For Goal Achievement: 11/11/15 Potential to Achieve Goals: Good    Frequency Min 2X/week   Barriers to discharge Decreased caregiver support      Co-evaluation               End of Session Equipment Utilized During Treatment: Gait belt Activity Tolerance: Patient limited by fatigue;Patient limited by pain Patient left: in chair;with call bell/phone within reach;with family/visitor present Nurse Communication: Mobility status         Time: [id number] PT Time Calculation (min) (ACUTE ONLY): 37 min   Charges:   PT Evaluation $PT Eval Moderate Complexity: 1 Procedure PT Treatments $Gait Training: 8-22 mins   PT G Codes:        Kolton Kienle Nov 14, 2015, 11:11 AM  Pager [phone]

## 2015-10-28 NOTE — Clinical Social Work Note (Signed)
CSW called patient's son and extended bed offer from [facility] (only facility that accepted). Patient's son accepted. CSW notified admissions coordinator, Jolyne Loa, to start insurance authorization as it can take 2 days to get.  Dayton Scrape, [place]

## 2015-10-28 NOTE — Care Management Important Message (Signed)
Important Message  Patient Details  Name: Desiree Parker MRN: [id number] Date of Birth: 1945-04-16   Medicare Important Message Given:  Yes    Loann Quill 10/28/2015, 7:58 AM

## 2015-10-28 NOTE — Progress Notes (Signed)
Patient Name: Desiree Parker Date of Encounter: 10/28/2015  Principal Problem:   CAP (community acquired pneumonia) Active Problems:   OBESITY, NOS   COPD with asthma ([place])   Chest pain   Shortness of breath   Monoarticular arthritis   Podagra   Pain in the chest   Hilar mass   Dyspnea on exertion   Primary Cardiologist: [facility] - Dr. Johnsie Cancel Patient Profile: Desiree Parker is a 70 year old female with a past medical history of DM and HTN. Admitted with increasing failure to thrive and dyspnea. He developed sharp chest pain at rest, cardiology consulted. Troponin negative.   SUBJECTIVE: feels well, denies chest pain. Says she is breathing better.   OBJECTIVE Filed Vitals:   10/27/15 1630 10/27/15 2100 10/28/15 0017 10/28/15 0453  BP: 183/52 147/71 154/71 146/64  Pulse: 93 80 73 67  Temp: 98.4 F (36.9 C) 98.2 F (36.8 C) 98.3 F (36.8 C) 97.5 F (36.4 C)  TempSrc: Oral Oral Oral Oral  Resp: 20 20 18 18   Weight:    197 lb 8.5 oz (89.6 kg)  SpO2: 98% 92% 94% 98%    Intake/Output Summary (Last 24 hours) at 10/28/15 0753 Last data filed at 10/28/15 [id number]  Gross per 24 hour  Intake    650 ml  Output    916 ml  Net   -266 ml   Filed Weights   10/27/15 0527 10/28/15 0453  Weight: 191 lb 14.4 oz (87.045 kg) 197 lb 8.5 oz (89.6 kg)    PHYSICAL EXAM General: Well developed, well nourished, female in no acute distress. Head: Normocephalic, atraumatic.  Neck: Supple without bruits, no JVD. Lungs:  Resp regular and unlabored, diminished in bases. Heart: RRR, S1, S2, no S3, S4, or murmur; no rub. Abdomen: Soft, non-tender, non-distended, BS + x 4.  Extremities: No clubbing, cyanosis, generalized edema.  Neuro: Alert and oriented X 3. Moves all extremities spontaneously. Psych: Normal affect.  LABS: CBC: Recent Labs  10/26/15 1100 10/27/15 0437  WBC 13.7* 11.9*  NEUTROABS 10.0*  --   HGB 12.3 12.4  HCT 35.4* 36.9  MCV 84.9 86.0  PLT 277 [id number]   Basic Metabolic  Panel: Recent Labs  10/26/15 1100 10/27/15 0437  NA 133* 136  K 3.2* 3.5  CL 99* 100*  CO2 25 28  GLUCOSE 127* 114*  BUN 9 10  CREATININE 0.85 0.80  CALCIUM 8.3* 8.0*   Liver Function Tests: Recent Labs  10/26/15 1100  AST 66*  ALT 43  ALKPHOS 99  BILITOT 1.7*  PROT 7.4  ALBUMIN 3.2*   Cardiac Enzymes: Recent Labs  10/26/15 1710 10/26/15 2256 10/27/15 0437  TROPONINI <0.03 <0.03 <0.03    Recent Labs  10/26/15 1100  TROPIPOC 0.02   BNP:  B NATRIURETIC PEPTIDE  Date/Time Value Ref Range Status  10/26/2015 11:07 AM 43.0 0.0 - 100.0 pg/mL Final   Hemoglobin A1C: Recent Labs  10/26/15 1710  HGBA1C 6.5*   Fasting Lipid Panel: Recent Labs  10/27/15 0437  CHOL 98  HDL 40*  LDLCALC 48  TRIG 51  CHOLHDL 2.5   Thyroid Function Tests: Recent Labs  10/26/15 1710  TSH 3.024     Current facility-administered medications:  .  acetaminophen (TYLENOL) tablet 650 mg, 650 mg, Oral, Q4H PRN, Vivi Barrack, MD, 650 mg at 10/26/15 2114 .  aspirin EC tablet 81 mg, 81 mg, Oral, Daily, Vivi Barrack, MD, 81 mg at 10/27/15 1000 .  atorvastatin (  LIPITOR) tablet 40 mg, 40 mg, Oral, Daily, Vivi Barrack, MD, 40 mg at 10/27/15 1000 .  [COMPLETED] azithromycin (ZITHROMAX) tablet 500 mg, 500 mg, Oral, Daily, 500 mg at 10/27/15 1654 **FOLLOWED BY** azithromycin (ZITHROMAX) tablet 250 mg, 250 mg, Oral, Daily, Abdoulaye Diallo, MD .  cefTRIAXone (ROCEPHIN) 1 g in dextrose 5 % 50 mL IVPB, 1 g, Intravenous, Q24H, Abdoulaye Diallo, MD, 1 g at 10/27/15 1654 .  enalapril (VASOTEC) tablet 10 mg, 10 mg, Oral, Daily, Vivi Barrack, MD, 10 mg at 10/27/15 1000 .  enoxaparin (LOVENOX) injection 40 mg, 40 mg, Subcutaneous, Q24H, Vivi Barrack, MD, 40 mg at 10/27/15 2108 .  fluticasone furoate-vilanterol (BREO ELLIPTA) 200-25 MCG/INH 1 puff, 1 puff, Inhalation, Daily, Blane Ohara McDiarmid, MD, 1 puff at 10/27/15 1114 .  gi cocktail (Maalox,Lidocaine,Donnatal), 30 mL, Oral, QID PRN, Vivi Barrack, MD .  insulin aspart (novoLOG) injection 0-15 Units, 0-15 Units, Subcutaneous, TID WC, Vivi Barrack, MD, 3 Units at 10/27/15 1726 .  insulin aspart (novoLOG) injection 0-5 Units, 0-5 Units, Subcutaneous, QHS, Vivi Barrack, MD, 0 Units at 10/26/15 2331 .  ipratropium-albuterol (DUONEB) 0.5-2.5 (3) MG/3ML nebulizer solution 3 mL, 3 mL, Nebulization, Q4H PRN, Vivi Barrack, MD, 3 mL at 10/26/15 2137 .  ondansetron (ZOFRAN) injection 4 mg, 4 mg, Intravenous, Q6H PRN, Vivi Barrack, MD .  pantoprazole (PROTONIX) EC tablet 40 mg, 40 mg, Oral, Daily, Vivi Barrack, MD, 40 mg at 10/27/15 1000 .  [START ON 11/02/2015] predniSONE (DELTASONE) tablet 40 mg, 40 mg, Oral, QAC breakfast, Blane Ohara McDiarmid, MD    TELE:  NSR  ECG: NSR  Radiology/Studies: Dg Chest 2 View  10/26/2015  CLINICAL DATA:  Left-sided chest pain and shortness of breath for 5 days. EXAM: CHEST  2 VIEW COMPARISON:  10/21/2015.  01/27/2013. FINDINGS: Stable asymmetric elevation right hemidiaphragm. No focal airspace consolidation. No pulmonary edema or pleural effusion. Right hilar fullness is stable The visualized bony structures of the thorax are intact. IMPRESSION: No active cardiopulmonary disease. Electronically Signed   By: Misty Stanley M.D.   On: 10/26/2015 11:46   Ct Angio Chest Pe W Or Wo Contrast  10/27/2015  CLINICAL DATA:  Shortness of Breath EXAM: CT ANGIOGRAPHY CHEST WITH CONTRAST TECHNIQUE: Multidetector CT imaging of the chest was performed using the standard protocol during bolus administration of intravenous contrast. Multiplanar CT image reconstructions and MIPs were obtained to evaluate the vascular anatomy. CONTRAST:  100 mL Isovue 370 nonionic COMPARISON:  Chest radiograph October 26, 2015 FINDINGS: Cardiovascular: There is no demonstrable pulmonary embolus. There is atherosclerotic calcification in the aorta but no appreciable aneurysm or dissection. Visualized great vessels appear unremarkable. Pericardium  is not appreciably thickened. Mediastinum/Nodes: Visualized thyroid appears normal. There is no appreciable adenopathy. There is a small hiatal hernia. Lungs/Pleura: There is stable elevation of the right hemidiaphragm. There is airspace consolidation in portions of the medial and posterior segments of the right lower lobe. There is patchy atelectasis in the left lower lobe. Upper Abdomen: There are foci of atherosclerotic calcification in the aorta. Visualized upper abdominal structures otherwise are unremarkable. Musculoskeletal: There is degenerative change in the thoracic spine. There are no blastic or lytic bone lesions. Review of the MIP images confirms the above findings. IMPRESSION: No demonstrable pulmonary embolus. There is infiltrate consistent with pneumonia in portions of the right lower lobe. There is patchy atelectasis in the left lower lobe. There is no appreciable adenopathy. The prominence in the right  hilum noted previously is apparently due to vascular prominence as opposed to mass or adenopathy. There are foci of aortic atherosclerotic calcification. No thoracic aortic aneurysm or dissection evident. Small hiatal hernia. Electronically Signed   By: Lowella Grip III M.D.   On: 10/27/2015 12:06   Dg Toe Great Left  10/26/2015  CLINICAL DATA:  Pain and swelling in the great toe bilaterally. EXAM: LEFT GREAT TOE COMPARISON:  Right toe radiographs from the same day. FINDINGS: Moderate osteopenia is present. Hallux valgus deformity is similar to the right foot. There is lateral subluxation of the first MTP joint. No significant erosion is present. No radiopaque foreign body is noted. IMPRESSION: 1. Hallux valgus deformity. 2. Moderate osteopenia. 3. Soft tissue swelling without significant aeration to suggest inflammatory arthritis. Electronically Signed   By: San Morelle M.D.   On: 10/26/2015 15:53   Dg Toe Great Right  10/26/2015  CLINICAL DATA:  Pain. EXAM: RIGHT GREAT TOE  COMPARISON:  None. FINDINGS: Marked hallux valgus deformity associated with osteophyte at the first metatarsophalangeal joint. No acute fracture or subluxation. No erosions or soft tissue swelling. IMPRESSION: Marked hallux valgus deformity. Electronically Signed   By: Nolon Nations M.D.   On: 10/26/2015 15:52     Current Medications:  . aspirin EC  81 mg Oral Daily  . atorvastatin  40 mg Oral Daily  . azithromycin  250 mg Oral Daily  . cefTRIAXone (ROCEPHIN)  IV  1 g Intravenous Q24H  . enalapril  10 mg Oral Daily  . enoxaparin (LOVENOX) injection  40 mg Subcutaneous Q24H  . fluticasone furoate-vilanterol  1 puff Inhalation Daily  . insulin aspart  0-15 Units Subcutaneous TID WC  . insulin aspart  0-5 Units Subcutaneous QHS  . pantoprazole  40 mg Oral Daily  . [START ON 11/02/2015] predniSONE  40 mg Oral QAC breakfast      ASSESSMENT AND PLAN: Principal Problem:   CAP (community acquired pneumonia) Active Problems:   OBESITY, NOS   COPD with asthma ([place])   Chest pain   Shortness of breath   Monoarticular arthritis   Podagra   Pain in the chest   Hilar mass   Dyspnea on exertion  1. Chest pain: Echo is normal with no wall motion abnormalities, troponin negative x4, EKG shows no acute changes. We can consider outpatient stress testing once other medical issues have resolved. Continue daily ASA.   2. Dyspnea: BNP normal, Echo shows EF of 55-60%. Chest CT shows no PE, has right lower lobe infiltrate consistent with pneumonia. Primary team is treating.   3. HTN: hypertensive, can consider increasing ACE-I to 20mg  daily. Renal function within normal limits.   4. DM: A1C 6.5, on sliding scale. Continue same.     Signed, Arbutus Leas , NP 7:53 AM 10/28/2015 Pager [phone]  Patient with better verbal interaction Still with dyspnea and mechanically hard to take deep breath No further inpatient cardiac w/u planned  CT with pneumonia no PE and echo with normal EF  Jenkins Rouge

## 2015-10-28 NOTE — Progress Notes (Signed)
Family Medicine Teaching Service Daily Progress Note Intern Pager: [phone]  Patient name: Desiree Parker Medical record number: [id number] Date of birth: 1946/03/15 Age: 70 y.o. Gender: female  Primary Care Provider: Lupita Dawn, MD Consultants: Cardiology Code Status: DNR  Pt Overview and Major Events to Date:  CXR, EKG, Troponin trending, breathing treat, right and left toe x ray  Assessment and Plan: Desiree Parker is a 70 y.o. female presenting with Chest pain and shortness of breath. PMH is significant for HTN, HLD, T2DM, COPD, Hallux valgus  #Chest Pain / SOB.  CTA angio of the chest showed evidence of pneumonia in the Right Lower Lobe and some atelectasis in the LLL. Patient still on 2L [place] with oxygen saturation in the upper 90's. Patient continue to be afebrile. Started on antibiotics yesterday. Seen by Cardiology who recommended outpatient stress test.  --Started patient on azithromycin 500mg  on day 1 (7/17) and 250mg  for the next 4 days. Today is day 1 at the 250 mg dosage (END date 7/22) -- Ceftriaxone 1g IV (start 7/17) day 2 of 5 -- On telemetry -- EKG in AM -- duonebs q4hrs prn for possible underlying COPD, would not start steroids or abx at this point as symptoms not entirely consistent with flare --Wean off oxygen as tolerated   #Leukocytosis. WBC 13.7 on admission, down to 12.4 this morning most likely secondary to pneumonia. No fevers or other active signs of infection.  --Continue to trend CBC  #UTI. Diagnosed on 7/12. Urine culture with klebsiella. No urinary symptoms. - Finish course of keflex today (7/16)  #HTN / HLD. LDL 143 on lipid panel last month. This admission LDL 48, HDL 40, T. Cholesterol 98, triglycerides 51. - Continue home enalapril and ASA 81mg   - Increase atorvastatin to 40mg   #T2DM. A1c 6.6 last month. Not on any home medications, recently stopped metformin. Continue to be within normal while inpatient. - SSI - Consider restarting  metformin as outpatient given mortality benefits.   #COPD. Not on any medications at home. On 2L [place], sating well.  - consider starting controller medication at discharge.   #Bilateral MTP Pain. Patient previously diagnosed with hallux valgus. Left MTP mildly erythematous, but does not appear infected. Plain films consistent hallux valgus deformity and no findings to suggest inflammatory arthritis. Doubt gout flare given bilateral presentation and plain film findings.  --Tylenol as needed for pain,  -- Consider checking uric acid level  FEN/GI: Heart Healthy, SLIV PPx: Lovenox  Disposition: Admit to inpatient pending clinical improvement   Subjective:  Patient was feeling better this morning, breathing as improved she did not need oxygen overnight. Patient is tolerating food, and denies nausea, vomiting, diarrhea, fever, chills and chest pain.  Objective: Temp:  [97.5 F (36.4 C)-98.4 F (36.9 C)] 97.5 F (36.4 C) (07/18 0453) Pulse Rate:  [67-93] 67 (07/18 0453) Resp:  [18-20] 18 (07/18 0453) BP: (146-183)/(52-71) 146/64 mmHg (07/18 0453) SpO2:  [92 %-98 %] 98 % (07/18 0453) Weight:  [197 lb 8.5 oz (89.6 kg)] 197 lb 8.5 oz (89.6 kg) (07/18 0453) Physical Exam: General: 70yo female in NAD lying in hospital bed Eyes: PERRL, EOMI ENTM: MMM Neck: FROM, Supple Cardiovascular: RRR, no murmurs Respiratory: NWOB, CTAB, speaking in full sentences.  Abdomen: +BS, S, NT, ND MSK: WWP, No cyanosis. Trace non-pitting edema in LE to mid shin bilaterally. Bilateral first MTP joints with valgus deformity. Left MTP mildly erythematous and tender to palpation.  Skin: Warm, Dry Neuro: Alert and oriented. No  focal deficits Psych: Appropriate mood and affect.   Laboratory:  Recent Labs Lab 10/21/15 2056 10/26/15 1100 10/27/15 0437  WBC 5.4 13.7* 11.9*  HGB 13.5 12.3 12.4  HCT 39.7 35.4* 36.9  PLT 245 277 275    Recent Labs Lab 10/21/15 2056 10/26/15 1100 10/27/15 0437  NA 138  133* 136  K 3.2* 3.2* 3.5  CL 104 99* 100*  CO2 27 25 28   BUN 8 9 10   CREATININE 0.77 0.85 0.80  CALCIUM 8.5* 8.3* 8.0*  PROT  --  7.4  --   BILITOT  --  1.7*  --   ALKPHOS  --  99  --   ALT  --  43  --   AST  --  66*  --   GLUCOSE 133* 127* 114*     Imaging/Diagnostic Tests: Ct Angio Chest Pe W Or Wo Contrast  10/27/2015  CLINICAL DATA:  Shortness of Breath EXAM: CT ANGIOGRAPHY CHEST WITH CONTRAST TECHNIQUE: Multidetector CT imaging of the chest was performed using the standard protocol during bolus administration of intravenous contrast. Multiplanar CT image reconstructions and MIPs were obtained to evaluate the vascular anatomy. CONTRAST:  100 mL Isovue 370 nonionic COMPARISON:  Chest radiograph October 26, 2015 FINDINGS: Cardiovascular: There is no demonstrable pulmonary embolus. There is atherosclerotic calcification in the aorta but no appreciable aneurysm or dissection. Visualized great vessels appear unremarkable. Pericardium is not appreciably thickened. Mediastinum/Nodes: Visualized thyroid appears normal. There is no appreciable adenopathy. There is a small hiatal hernia. Lungs/Pleura: There is stable elevation of the right hemidiaphragm. There is airspace consolidation in portions of the medial and posterior segments of the right lower lobe. There is patchy atelectasis in the left lower lobe. Upper Abdomen: There are foci of atherosclerotic calcification in the aorta. Visualized upper abdominal structures otherwise are unremarkable. Musculoskeletal: There is degenerative change in the thoracic spine. There are no blastic or lytic bone lesions. Review of the MIP images confirms the above findings. IMPRESSION: No demonstrable pulmonary embolus. There is infiltrate consistent with pneumonia in portions of the right lower lobe. There is patchy atelectasis in the left lower lobe. There is no appreciable adenopathy. The prominence in the right hilum noted previously is apparently due to vascular  prominence as opposed to mass or adenopathy. There are foci of aortic atherosclerotic calcification. No thoracic aortic aneurysm or dissection evident. Small hiatal hernia. Electronically Signed   By: Lowella Grip III M.D.   On: 10/27/2015 12:06     Marjie Skiff, MD 10/28/2015, 6:52 AM PGY-1, [place] Intern pager: [phone], text pages welcome

## 2015-10-29 ENCOUNTER — Encounter (HOSPITAL_COMMUNITY): Payer: Self-pay | Admitting: General Practice

## 2015-10-29 DIAGNOSIS — R0609 Other forms of dyspnea: Secondary | ICD-10-CM | POA: Diagnosis not present

## 2015-10-29 DIAGNOSIS — J189 Pneumonia, unspecified organism: Secondary | ICD-10-CM | POA: Diagnosis not present

## 2015-10-29 DIAGNOSIS — R222 Localized swelling, mass and lump, trunk: Secondary | ICD-10-CM | POA: Diagnosis not present

## 2015-10-29 DIAGNOSIS — J449 Chronic obstructive pulmonary disease, unspecified: Secondary | ICD-10-CM | POA: Diagnosis not present

## 2015-10-29 LAB — GLUCOSE, CAPILLARY
GLUCOSE-CAPILLARY: 127 mg/dL — AB (ref 65–99)
GLUCOSE-CAPILLARY: 177 mg/dL — AB (ref 65–99)
GLUCOSE-CAPILLARY: 138 mg/dL — AB (ref 65–99)
GLUCOSE-CAPILLARY: 131 mg/dL — AB (ref 65–99)

## 2015-10-29 LAB — BASIC METABOLIC PANEL
Anion gap: 10 (ref 5–15)
BUN: 15 mg/dL (ref 6–20)
CALCIUM: 8.3 mg/dL — AB (ref 8.9–10.3)
CHLORIDE: 98 mmol/L — AB (ref 101–111)
CO2: 28 mmol/L (ref 22–32)
CREATININE: 0.98 mg/dL (ref 0.44–1.00)
GFR calc Af Amer: >60 mL/min (ref >60)
GFR calc non Af Amer: 58 mL/min — ABNORMAL LOW (ref >60)
GLUCOSE: 128 mg/dL — AB (ref 65–99)
Potassium: 3.6 mmol/L (ref 3.5–5.1)
Sodium: 136 mmol/L (ref 135–145)

## 2015-10-29 LAB — CBC
HCT: 36.5 % (ref 36.0–46.0)
Hemoglobin: 12.4 g/dL (ref 12.0–15.0)
MCH: 29.3 pg (ref 26.0–34.0)
MCHC: 34 g/dL (ref 30.0–36.0)
MCV: 86.3 fL (ref 78.0–100.0)
PLATELETS: 368 10*3/uL (ref 150–400)
RBC: 4.23 MIL/uL (ref 3.87–5.11)
RDW: 12.8 % (ref 11.5–15.5)
WBC: 13 10*3/uL — ABNORMAL HIGH (ref 4.0–10.5)

## 2015-10-29 MED ORDER — LEVOFLOXACIN 750 MG PO TABS: 750.0000 mg | ORAL_TABLET | Freq: Every day | ORAL | Status: DC

## 2015-10-29 NOTE — Progress Notes (Signed)
Family Medicine Teaching Service Daily Progress Note Intern Pager: [phone]  Patient name: Desiree Parker Medical record number: [id number] Date of birth: [birth year] Age: 70 y.o. Gender: female  Primary Care Provider: Lupita Dawn, MD Consultants: Cardiology Code Status: DNR  Pt Overview and Major Events to Date:  CXR, EKG, Troponin trending, breathing treat, right and left toe x ray  Assessment and Plan: Desiree Parker is a 70 y.o. female presenting with Chest pain and shortness of breath. PMH is significant for HTN, HLD, T2DM, COPD, Hallux valgus  #Chest Pain / SOB.  CTA angio of the chest showed evidence of pneumonia in the Right Lower Lobe and some atelectasis in the LLL. Patient weaned off oxygen yesterday and maintained good saturation mid 90's. Patient was febrile this morning to a temp of 38.2 (100.8). Currently on antibiotic. Will continue to monitor.  --Discontinued azithromycin and Ceftriaxone --Switched to Levofloxacin 750 mg po today is DAY 2 of 5 Start date (7/18) -- On telemetry -- duonebs q4hrs prn for possible underlying COPD, would not start steroids or abx at this point as symptoms not entirely consistent with flare  #Leukocytosis. WBC 13.7 on admission, down to . Patient had a fever earlier this morning, rest of vitals are within normal limit. Will assess other sign of infection upon examining patient this morning -- Will continue to trend CBC  #UTI. Diagnosed on 7/12. Urine culture with klebsiella. No urinary symptoms. --Consider urine culture with new onset of fever - Finish course of keflex today (7/16)  #HTN / HLD. LDL 143 on lipid panel last month. This admission LDL 48, HDL 40, T. Cholesterol 98, triglycerides 51. - Continue home enalapril and ASA 81mg   - Increase atorvastatin to 40mg   #T2DM. A1c 6.6 last month. Not on any home medications, recently stopped metformin. Continue to be within normal while inpatient. - SSI - Consider restarting metformin  as outpatient given mortality benefits.   #COPD. Not on any medications at home. On RA maintaining good oxyfen saturation mid 90's. - consider starting controller medication at discharge.   #Bilateral MTP Pain. Patient previously diagnosed with hallux valgus. Left MTP mildly erythematous, but does not appear infected. Plain films consistent hallux valgus deformity and no findings to suggest inflammatory arthritis. Doubt gout flare given bilateral presentation and plain film findings.  --Tylenol as needed for pain,  -- Consider checking uric acid level  FEN/GI: Heart Healthy, SLIV PPx: Lovenox  Disposition: Patient accepted at SNF, [facility] working on insurance authorization  Subjective:  Patient denies fever, chills overnight. She feels better, up and sitting in the chair, ambulate with minimal assistance. She does not want to go to a SNF but understand that is what is best for her.  Objective: Temp:  [98.4 F (36.9 C)-100.8 F (38.2 C)] 100.8 F (38.2 C) (07/19 0452) Pulse Rate:  [79-90] 90 (07/19 0452) Resp:  [18] 18 (07/19 0452) BP: (122-136)/(58-69) 136/69 mmHg (07/19 0452) SpO2:  [95 %-99 %] 95 % (07/19 0452) Weight:  [193 lb 9 oz (87.8 kg)] 193 lb 9 oz (87.8 kg) (07/19 0028) Physical Exam: General: 70yo female in NAD lying in hospital bed Eyes: PERRL, EOMI ENTM: MMM Neck: FROM, Supple Cardiovascular: RRR, no murmurs Respiratory: NWOB, CTAB, speaking in full sentences.  Abdomen: +BS, S, NT, ND MSK: WWP, No cyanosis. Trace non-pitting edema in LE to mid shin bilaterally. Bilateral first MTP joints with valgus deformity. Left MTP mildly erythematous and tender to palpation.  Skin: Warm, Dry Neuro: Alert and oriented.  No focal deficits Psych: Appropriate mood and affect.   Laboratory:  Recent Labs Lab 10/26/15 1100 10/27/15 0437  WBC 13.7* 11.9*  HGB 12.3 12.4  HCT 35.4* 36.9  PLT 277 275    Recent Labs Lab 10/26/15 1100 10/27/15 0437  NA 133* 136  K 3.2* 3.5   CL 99* 100*  CO2 25 28  BUN 9 10  CREATININE 0.85 0.80  CALCIUM 8.3* 8.0*  PROT 7.4  --   BILITOT 1.7*  --   ALKPHOS 99  --   ALT 43  --   AST 66*  --   GLUCOSE 127* 114*     Imaging/Diagnostic Tests: No results found.   Marjie Skiff, MD 10/29/2015, 6:39 AM PGY-1, [place] Intern pager: [phone], text pages welcome

## 2015-10-29 NOTE — Care Management CC44 (Signed)
Condition Code 44 Documentation Completed  Patient Details  Name: LINDELL SEMMEL MRN: [id number] Date of Birth: June 02, 1945   Condition Code 44 given:  Yes Patient signature on Condition Code 44 notice:  Yes Documentation of 2 MD's agreement:  Yes Code 44 added to claim:  Yes    Erenest Rasher, RN 10/29/2015, 1:59 PM

## 2015-10-29 NOTE — Progress Notes (Signed)
NCM spoke to son, [name] and pt at bedside. Pt states she want her son to take her over to SNF tomorrow if she dc. Son works during the day and will not be able to transport until after 5 pm. He is requesting [facility]. Explained to son that [facility] will not be able to transport large suitcase, that he will have to take to facility tomorrow. Jonnie Finner RN CCM Case Mgmt phone [phone]

## 2015-10-29 NOTE — Evaluation (Signed)
Occupational Therapy Evaluation Patient Details Name: Desiree Parker MRN: [id number] DOB: Jan 20, 1946 Today's Date: 10/29/2015    History of Present Illness 70 y.o. female presenting with Chest pain and shortness of breath. CT chest negative for PE; +consolidated pna PMH is significant for HTN, HLD, T2DM, COPD, Hallux valgus.   Clinical Impression   Pt admitted with above.  She demonstrates the below listed deficits.  She fatigues quickly with basic ADL activities placing her at risk for falls.  Feel she would benefit from SNF level rehab to reduce risk of injury and allow her to maximize independence with ADLs. All further OT needs can be addressed at SNF.  Acute OT will sign off at this time.     Follow Up Recommendations  SNF;Supervision/Assistance - 24 hour    Equipment Recommendations  None recommended by OT    Recommendations for Other Services       Precautions / Restrictions Precautions Precautions: Fall Restrictions Weight Bearing Restrictions: No      Mobility Bed Mobility Overal bed mobility: Modified Independent                Transfers Overall transfer level: Needs assistance Equipment used: Rolling walker (2 wheeled) Transfers: Sit to/from [facility] Sit to Stand: Min guard Stand pivot transfers: Min guard            Balance Overall balance assessment: Needs assistance Sitting-balance support: Feet supported Sitting balance-Leahy Scale: Good     Standing balance support: During functional activity;Single extremity supported Standing balance-Leahy Scale: Poor Standing balance comment: Relies on UE support                             ADL Overall ADL's : Needs assistance/impaired Eating/Feeding: Independent   Grooming: Wash/dry hands;Wash/dry face;Brushing hair;Min guard;Standing   Upper Body Bathing: Set up;Sitting   Lower Body Bathing: Min guard;Sit to/from stand   Upper Body Dressing : Set up   Lower  Body Dressing: Min guard;Sit to/from stand   Toilet Transfer: Min guard;Ambulation;Grab bars;Comfort height toilet   Toileting- Clothing Manipulation and Hygiene: Min guard;Sit to/from stand       Functional mobility during ADLs: Min guard;Rolling walker General ADL Comments: Pt requires min guard assist for balance and safety.  She fatigues quite quickly with simple ADL tasks.  Anticipate IADL become very challenging for her due to impaired endurance      Vision     Perception     Praxis      Pertinent Vitals/Pain Pain Assessment: No/denies pain     Hand Dominance Right   Extremity/Trunk Assessment Upper Extremity Assessment Upper Extremity Assessment: Generalized weakness   Lower Extremity Assessment Lower Extremity Assessment: Defer to PT evaluation   Cervical / Trunk Assessment Cervical / Trunk Assessment: Kyphotic   Communication Communication Communication: No difficulties   Cognition Arousal/Alertness: Awake/alert Behavior During Therapy: WFL for tasks assessed/performed Overall Cognitive Status: No family/caregiver present to determine baseline cognitive functioning Area of Impairment: Attention;Safety/judgement;Problem solving   Current Attention Level: Selective     Safety/Judgement: Decreased awareness of safety   Problem Solving: Requires verbal cues General Comments: Pt self distracts when answering questions and never fully answers the question being asked and has no awareness of this.  Requires min A for problem solving    General Comments       Exercises       Shoulder Instructions      Home Living Family/patient expects  to be discharged to:: Skilled nursing facility Living Arrangements: Alone                                      Prior Functioning/Environment Level of Independence: Needs assistance  Gait / Transfers Assistance Needed: began using rollator full-time (except at church!?) one month ago; began using  rollator when going outside ~1 yr ago due to leg fatigue; denies h/o falls ADL's / Homemaking Assistance Needed: assist for all community needs (she does not drive; does not have stamina to get groceries); uses rollator to carry items in kitchen        OT Diagnosis: Generalized weakness;Cognitive deficits   OT Problem List: Decreased strength;Decreased activity tolerance;Impaired balance (sitting and/or standing);Decreased cognition;Decreased safety awareness;Decreased knowledge of use of DME or AE;Obesity   OT Treatment/Interventions: Self-care/ADL training;DME and/or AE instruction;Therapeutic activities;Cognitive remediation/compensation;Patient/family education;Balance training    OT Goals(Current goals can be found in the care plan section) Acute Rehab OT Goals Patient Stated Goal: to get stronger  OT Goal Formulation: All assessment and education complete, DC therapy  OT Frequency:     Barriers to D/C: Decreased caregiver support          Co-evaluation              End of Session Equipment Utilized During Treatment: Rolling walker  Activity Tolerance: Patient tolerated treatment well Patient left: in bed;with call bell/phone within reach   Time: 1102-1120 OT Time Calculation (min): 18 min Charges:  OT General Charges $OT Visit: 1 Procedure OT Evaluation $OT Eval Low Complexity: 1 Procedure G-Codes: OT G-codes **NOT FOR INPATIENT CLASS** Functional Limitation: Self care Self Care Current Status [id number]): At least 20 percent but less than 40 percent impaired, limited or restricted Self Care Goal Status [id number]): At least 20 percent but less than 40 percent impaired, limited or restricted Self Care Discharge Status [phone]): At least 20 percent but less than 40 percent impaired, limited or restricted  Jaziel Bennett M 10/29/2015, 2:05 PM

## 2015-10-29 NOTE — Care Management Note (Signed)
Case Management Note  Patient Details  Name: Desiree Parker MRN: [id number] Date of Birth: 1946/01/08  Subjective/Objective:    COPD, UTI, chest pain                Action/Plan: Discharge Planning: AVS reviewed:  NCM spoke to pt and states her son, [name] [phone] assist with care. Contacted [facility] to make aware of dc to SNF. CSW following for SNF placement.   Edward Qualia MD    Expected Discharge Date:  10/29/2015              Expected Discharge Plan:  Skilled Nursing Facility  In-House Referral:  Clinical Social Work  Discharge planning Services  CM Consult  Post Acute Care Choice:  NA Choice offered to:  Patient  DME Arranged:  N/A DME Agency:  NA  HH Arranged:  NA HH Agency:  NA  Status of Service:  Completed, signed off  If discussed at [place] of Stay Meetings, dates discussed:    Additional Comments:  Erenest Rasher, RN 10/29/2015, 2:02 PM

## 2015-10-29 NOTE — Care Management Obs Status (Signed)
[place] NOTIFICATION   Patient Details  Name: ANAHI SPARBY MRN: [id number] Date of Birth: 1945-09-18   Medicare Observation Status Notification Given:  Yes    Erenest Rasher, RN 10/29/2015, 1:54 PM

## 2015-10-29 NOTE — Progress Notes (Signed)
SATURATION QUALIFICATIONS: (This note is used to comply with regulatory documentation for home oxygen)  Patient Saturations on Room Air at Rest = 94%  Patient Saturations on Room Air while Ambulating = 94% Patient Saturations on 0 Liters of oxygen while Ambulating = 94%  Please briefly explain why patient needs home oxygen:  Pt does not qualify for Home O2, as her oxygen saturation did not decrease under 94%.

## 2015-10-30 LAB — GLUCOSE, CAPILLARY
GLUCOSE-CAPILLARY: 123 mg/dL — AB (ref 65–99)
GLUCOSE-CAPILLARY: 130 mg/dL — AB (ref 65–99)
Glucose-Capillary: 121 mg/dL — ABNORMAL HIGH (ref 65–99)

## 2015-10-30 NOTE — Progress Notes (Signed)
Towanda Octave, CSW, arrived on [facility] unit advising that she will be notifying [facility] for pt transportation to [facility].  Son has been notified, as well, by CSW.

## 2015-10-30 NOTE — Progress Notes (Signed)
Spoke with Lorriane Shire, CSW, inquiring to the status of this discharge.  CSW stated that she needs to find out if pt's insurance Scientist, clinical (histocompatibility and immunogenetics)) has affirmed the SNF bed placement.  CSW stated she would call me back with more information.

## 2015-10-30 NOTE — Clinical Social Work Note (Signed)
Clinical Social Worker facilitated patient discharge including contacting patient family and facility to confirm patient discharge plans.  Clinical information faxed to facility and family agreeable with plan.  CSW arranged ambulance transport via [facility] to [facility].  RN to call report prior to discharge.  Clinical Social Worker will sign off for now as social work intervention is no longer needed. Please consult us again if new need arises.  Jesse Sharmel Ballantine, LCSW [phone] 

## 2015-10-30 NOTE — Progress Notes (Signed)
Called [facility] at [phone], gave report to receiving nurse, Lavella Lemons.    IV D/C'd.  Telemetry D/C'd and CC<D notified.  Pt in no acute distress, and awaiting [facility] transportation.  Family has been notified by CSW.

## 2015-10-30 NOTE — Discharge Summary (Signed)
[facility] Discharge Summary  Patient name: Desiree Parker Medical record number: [id number] Date of birth: Mar 12, 1946 Age: 70 y.o. Gender: female Date of Admission: 10/26/2015  Date of Discharge: 10/30/2015 Admitting Physician: Blane Ohara McDiarmid, MD  Primary Care Provider: Lupita Dawn, MD Consultants: Cardiology  Indication for Hospitalization: Shortness of Breath  Discharge Diagnoses/Problem List:  Right lower lobe pneumonia   Disposition: SNF  Discharge Condition: Stable  Discharge Exam:  General: 70yo female in NAD lying in hospital bed Eyes: PERRL, EOMI ENTM: MMM Neck: FROM, Supple Cardiovascular: RRR, no murmurs Respiratory: NWOB, CTAB, speaking in full sentences.  Abdomen: +BS, S, NT, ND MSK: WWP, No cyanosis. Trace non-pitting edema in LE to mid shin bilaterally. Bilateral first MTP joints with valgus deformity. Left MTP mildly erythematous and tender to palpation.  Skin: Warm, Dry Neuro: Alert and oriented. No focal deficits Psych: Appropriate mood and affect.   Brief Hospital Course:  Desiree Parker is a 70 y.o. female presenting with Chest pain and shortness of breath. PMH is significant for HTN, HLD, T2DM, COPD, Hallux valgus.  #Chest Pain / SOB, resolved Patient was admitted with shortness of breath and mild cough. CTA showed RLL pneumonia. Patient was started on azithromycin and ceftriaxone for CAP treatment and was transitioned to levofloxacin 750 mg. Patient on day 3 of five on discharge day. Patient continue to be afebrile, and has been completely weaned off oxygen for 48 hrs and maintaining oxygen saturation in the mid 90s.  --Continue Levofloxacin 750 mg po, today is DAY 3 of 5 Start date (7/18) End date 7/22   #Leukocytosis, resolving  Patient WBC on admission was 13.7. Patient started on antibiotics with WBC trending down to 11.9. Patient has been afebrile, and showing no signs of infection. Patient is not tachycardic or  tachypneic.   #UTI. Diagnosed on 7/12. Urine culture with klebsiella. Patient completed course of keflex prior to admission. Patient denies any urinary symptoms throughout admission.  #HTN / HLD. LDL 143 on lipid panel last month. This admission LDL 48, HDL 40, T. Cholesterol 98, triglycerides 51. --Continue home enalapril and ASA 81mg   --Increase atorvastatin to 40mg   #T2DM. A1c 6.6 last month. Not on any home medications, recently stopped metformin. Continue to be within normal while inpatient. --Follow up with PCP, and consider restarting metformin  #COPD. Not on any medications at home. Briefly, on oxygen during hospital most likely secondary to PNA. Will reassess need for controller medication with PCP.   #Bilateral MTP Pain. Patient previously diagnosed with hallux valgus. Left MTP mildly erythematous, but does not appear infected. Plain films consistent hallux valgus deformity and no findings to suggest inflammatory arthritis. Doubt gout flare given bilateral presentation and plain film findings.   Issues for Follow Up:  1. Resolution of RLL pneumonia, patient will be discharged with two more days of Levofloxacin 750mg . Consider follow Chest x ray.  Significant Procedures: None  Significant Labs and Imaging:   Recent Labs Lab 10/26/15 1100 10/27/15 0437 10/29/15 0756  WBC 13.7* 11.9* 13.0*  HGB 12.3 12.4 12.4  HCT 35.4* 36.9 36.5  PLT 277 275 368    Recent Labs Lab 10/26/15 1100 10/27/15 0437 10/29/15 0756  NA 133* 136 136  K 3.2* 3.5 3.6  CL 99* 100* 98*  CO2 25 28 28   GLUCOSE 127* 114* 128*  BUN 9 10 15   CREATININE 0.85 0.80 0.98  CALCIUM 8.3* 8.0* 8.3*  ALKPHOS 99  --   --  AST 66*  --   --   ALT 43  --   --   ALBUMIN 3.2*  --   --    Dg Chest 2 View  10/26/2015  CLINICAL DATA:  Left-sided chest pain and shortness of breath for 5 days. EXAM: CHEST  2 VIEW COMPARISON:  10/21/2015.  01/27/2013. FINDINGS: Stable asymmetric elevation right hemidiaphragm.  No focal airspace consolidation. No pulmonary edema or pleural effusion. Right hilar fullness is stable The visualized bony structures of the thorax are intact. IMPRESSION: No active cardiopulmonary disease. Electronically Signed   By: Misty Stanley M.D.   On: 10/26/2015 11:46   Ct Angio Chest Pe W Or Wo Contrast  10/27/2015  CLINICAL DATA:  Shortness of Breath EXAM: CT ANGIOGRAPHY CHEST WITH CONTRAST TECHNIQUE: Multidetector CT imaging of the chest was performed using the standard protocol during bolus administration of intravenous contrast. Multiplanar CT image reconstructions and MIPs were obtained to evaluate the vascular anatomy. CONTRAST:  100 mL Isovue 370 nonionic COMPARISON:  Chest radiograph October 26, 2015 FINDINGS: Cardiovascular: There is no demonstrable pulmonary embolus. There is atherosclerotic calcification in the aorta but no appreciable aneurysm or dissection. Visualized great vessels appear unremarkable. Pericardium is not appreciably thickened. Mediastinum/Nodes: Visualized thyroid appears normal. There is no appreciable adenopathy. There is a small hiatal hernia. Lungs/Pleura: There is stable elevation of the right hemidiaphragm. There is airspace consolidation in portions of the medial and posterior segments of the right lower lobe. There is patchy atelectasis in the left lower lobe. Upper Abdomen: There are foci of atherosclerotic calcification in the aorta. Visualized upper abdominal structures otherwise are unremarkable. Musculoskeletal: There is degenerative change in the thoracic spine. There are no blastic or lytic bone lesions. Review of the MIP images confirms the above findings. IMPRESSION: No demonstrable pulmonary embolus. There is infiltrate consistent with pneumonia in portions of the right lower lobe. There is patchy atelectasis in the left lower lobe. There is no appreciable adenopathy. The prominence in the right hilum noted previously is apparently due to vascular prominence  as opposed to mass or adenopathy. There are foci of aortic atherosclerotic calcification. No thoracic aortic aneurysm or dissection evident. Small hiatal hernia. Electronically Signed   By: Lowella Grip III M.D.   On: 10/27/2015 12:06   Dg Toe Great Left  10/26/2015  CLINICAL DATA:  Pain and swelling in the great toe bilaterally. EXAM: LEFT GREAT TOE COMPARISON:  Right toe radiographs from the same day. FINDINGS: Moderate osteopenia is present. Hallux valgus deformity is similar to the right foot. There is lateral subluxation of the first MTP joint. No significant erosion is present. No radiopaque foreign body is noted. IMPRESSION: 1. Hallux valgus deformity. 2. Moderate osteopenia. 3. Soft tissue swelling without significant aeration to suggest inflammatory arthritis. Electronically Signed   By: San Morelle M.D.   On: 10/26/2015 15:53   Dg Toe Great Right  10/26/2015  CLINICAL DATA:  Pain. EXAM: RIGHT GREAT TOE COMPARISON:  None. FINDINGS: Marked hallux valgus deformity associated with osteophyte at the first metatarsophalangeal joint. No acute fracture or subluxation. No erosions or soft tissue swelling. IMPRESSION: Marked hallux valgus deformity. Electronically Signed   By: Nolon Nations M.D.   On: 10/26/2015 15:52    Results/Tests Pending at Time of Discharge: None  Discharge Medications:    Medication List    STOP taking these medications        cephALEXin 500 MG capsule  Commonly known as:  KEFLEX  TAKE these medications        albuterol 108 (90 Base) MCG/ACT inhaler  Commonly known as:  PROVENTIL HFA;VENTOLIN HFA  Inhale 2 puffs into the lungs every 6 (six) hours as needed for wheezing.     alendronate 70 MG tablet  Commonly known as:  FOSAMAX  Take 1 tablet (70 mg total) by mouth every 7 (seven) days. Take with a full glass of water on an empty stomach.     aspirin 81 MG EC tablet  Take 81 mg by mouth daily.     atorvastatin 20 MG tablet  Commonly known  as:  LIPITOR  Take 1 tablet (20 mg total) by mouth daily.     Calcium Carb-Cholecalciferol 500-400 MG-UNIT Tabs  Commonly known as:  CALCIUM 500+D3  Take 2 tablets by mouth every morning.     enalapril 10 MG tablet  Commonly known as:  VASOTEC  Take 1 tablet (10 mg total) by mouth daily.     levofloxacin 750 MG tablet  Commonly known as:  LEVAQUIN  Take 1 tablet (750 mg total) by mouth daily.     lidocaine 5 % ointment  Commonly known as:  XYLOCAINE  Apply 1 application topically as needed.     omeprazole 20 MG capsule  Commonly known as:  PRILOSEC  Take 1 capsule (20 mg total) by mouth daily.        Discharge Instructions: Please refer to Patient Instructions section of EMR for full details.  Patient was counseled important signs and symptoms that should prompt return to medical care, changes in medications, dietary instructions, activity restrictions, and follow up appointments.   Follow-Up Appointments:     Follow-up Information    Follow up with [facility].   Specialty:  Home Health Services   Why:  [facility]   Contact information:   [place] [address] [phone]       Follow up with [facility] .   Specialty:  Skilled Nursing Facility   Contact information:   [place], MD 10/30/2015, 8:57 AM PGY-1, [place]

## 2015-10-30 NOTE — Progress Notes (Signed)
As per physician request, I contacted and spoke with Lorriane Shire, CSW, to inform that pt will be discharged today.  He also requested that I ask CSW to phone son to inform him that he [facility] would not be able to take pt's suitcase on the ambulance, and that he should make arrangements to pick up suitcase.  CSW is aware.

## 2015-10-30 NOTE — Clinical Social Work Placement (Signed)
   CLINICAL SOCIAL WORK PLACEMENT  NOTE  Date:  10/30/2015  Patient Details  Name: KIESA SAKAL MRN: [id number] Date of Birth: Aug 31, 1945  Clinical Social Work is seeking post-discharge placement for this patient at the [facility] level of care (*CSW will initial, date and re-position this form in  chart as items are completed):  Yes   Patient/family provided with [facility] Work Department's list of facilities offering this level of care within the geographic area requested by the patient (or if unable, by the patient's family).  Yes   Patient/family informed of their freedom to choose among providers that offer the needed level of care, that participate in Medicare, Medicaid or managed care program needed by the patient, have an available bed and are willing to accept the patient.  Yes   Patient/family informed of [facility]'s ownership interest in [facility] and [facility], as well as of the fact that they are under no obligation to receive care at these facilities.  PASRR submitted to EDS on 10/28/15     PASRR number received on       Existing PASRR number confirmed on       FL2 transmitted to all facilities in geographic area requested by pt/family on 10/28/15     FL2 transmitted to all facilities within larger geographic area on       Patient informed that his/her managed care company has contracts with or will negotiate with certain facilities, including the following:        Yes   Patient/family informed of bed offers received.  Patient chooses bed at [facility]     Physician recommends and patient chooses bed at      Patient to be transferred to [facility] on 10/30/15.  Patient to be transferred to facility by Ambulance     Patient family notified on 10/30/15 of transfer.  Name of family member notified:  Patient son at facility with facility admissions coordinator     PHYSICIAN Please sign FL2, Please sign DNR      Additional Comment:    Barbette Or, LCSW [phone]

## 2015-10-31 ENCOUNTER — Ambulatory Visit: Payer: Medicare HMO | Admitting: Family Medicine

## 2015-10-31 ENCOUNTER — Non-Acute Institutional Stay (SKILLED_NURSING_FACILITY): Payer: Medicare HMO | Admitting: Adult Health

## 2015-10-31 ENCOUNTER — Encounter: Payer: Self-pay | Admitting: Adult Health

## 2015-10-31 DIAGNOSIS — J189 Pneumonia, unspecified organism: Secondary | ICD-10-CM

## 2015-10-31 DIAGNOSIS — K219 Gastro-esophageal reflux disease without esophagitis: Secondary | ICD-10-CM | POA: Diagnosis not present

## 2015-10-31 DIAGNOSIS — J45909 Unspecified asthma, uncomplicated: Secondary | ICD-10-CM | POA: Diagnosis not present

## 2015-10-31 DIAGNOSIS — R5381 Other malaise: Secondary | ICD-10-CM

## 2015-10-31 DIAGNOSIS — E119 Type 2 diabetes mellitus without complications: Secondary | ICD-10-CM

## 2015-10-31 DIAGNOSIS — D72829 Elevated white blood cell count, unspecified: Secondary | ICD-10-CM | POA: Diagnosis not present

## 2015-10-31 DIAGNOSIS — J449 Chronic obstructive pulmonary disease, unspecified: Secondary | ICD-10-CM

## 2015-10-31 DIAGNOSIS — I5032 Chronic diastolic (congestive) heart failure: Secondary | ICD-10-CM

## 2015-10-31 DIAGNOSIS — E785 Hyperlipidemia, unspecified: Secondary | ICD-10-CM | POA: Diagnosis not present

## 2015-10-31 NOTE — Progress Notes (Signed)
Patient ID: Desiree Parker, female   DOB: October 28, 1945, 70 y.o.   MRN: [id number]    DATE:  10/31/2015   MRN:  [id number]  BIRTHDAY: August 07, 1945  Facility:  Nursing Home Location:  [place] Room Number: 1103-A  LEVEL OF CARE:  SNF [phone])  Contact Information    Name Relation Home Work Mobile   [name] [phone]  [phone]   Desiree Parker [phone]         Code Status History    Date Active Date Inactive Code Status Order ID Comments User Context   10/26/2015  3:37 PM 10/30/2015  7:33 PM DNR [id number]  Vivi Barrack, MD ED    Questions for Most Recent Historical Code Status (Order [id number])    Question Answer Comment   In the event of cardiac or respiratory ARREST Do not call a "code blue"    In the event of cardiac or respiratory ARREST Do not perform Intubation, CPR, defibrillation or ACLS    In the event of cardiac or respiratory ARREST Use medication by any route, position, wound care, and other measures to relive pain and suffering. May use oxygen, suction and manual treatment of airway obstruction as needed for comfort.        Chief Complaint  Patient presents with  . Hospitalization Follow-up    HISTORY OF PRESENT ILLNESS:  This is a 70 year old female who has been admitted to [facility] on 10/30/15 from [facility] has PMH of hypertension, hyperlipidemia, type 2 diabetes mellitus, COPD and hallux valgus. She was treated in the hospital for CAP. She was started on azithromycin and ceftriaxone and was transitioned to levofloxacin 750 mg . Her oxygen was weaned off and her O2 saturation was maintaining in the mid 90s. She was discharged with levofloxacin 750 mg 2 more days  She has been admitted for a short-term rehabilitation.  PAST MEDICAL HISTORY:  Past Medical History  Diagnosis Date  . Alternating exotropia   . Arterial insufficiency (HCC) 02/04/2004    ABI 0.84  . Degenerative disk disease  10/2005    Lumbar, anterolithesis, L4-5,5-S1  . Diabetes mellitus without complication ([place])   . Hypertension   . 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 CAP (community acquired pneumonia) 10/2015  . COPD (chronic obstructive pulmonary disease) ([place])   . Shortness of breath dyspnea      CURRENT MEDICATIONS: Reviewed  Patient's Medications  New Prescriptions   No medications on file  Previous Medications   ALBUTEROL (PROVENTIL HFA;VENTOLIN HFA) 108 (90 BASE) MCG/ACT INHALER    Inhale 2 puffs into the lungs every 6 (six) hours as needed for wheezing.   ALENDRONATE (FOSAMAX) 70 MG TABLET    Take 1 tablet (70 mg total) by mouth every 7 (seven) days. Take with a full glass of water on an empty stomach.   ASPIRIN 81 MG EC TABLET    Take 81 mg by mouth daily.     ATORVASTATIN (LIPITOR) 20 MG TABLET    Take 1 tablet (20 mg total) by mouth daily.   CALCIUM CARB-CHOLECALCIFEROL (CALCIUM 500+D3) 500-400 MG-UNIT TABS    Take 2 tablets by mouth every morning.   LEVOFLOXACIN (LEVAQUIN) 750 MG TABLET    Take 1 tablet (750 mg total) by mouth daily.   LIDOCAINE (XYLOCAINE) 5 % OINTMENT    Apply 1 application topically as needed.   LISINOPRIL (PRINIVIL,ZESTRIL) 10 MG TABLET    Take 10 mg by mouth daily.  OMEPRAZOLE (PRILOSEC) 20 MG CAPSULE    Take 1 capsule (20 mg total) by mouth daily.  Modified Medications   No medications on file  Discontinued Medications   ENALAPRIL (VASOTEC) 10 MG TABLET    Take 1 tablet (10 mg total) by mouth daily.     No Known Allergies   REVIEW OF SYSTEMS:  GENERAL: no change in appetite, no fatigue, no weight changes, no fever, chills or weakness EYES: Denies change in vision, dry eyes, eye pain, itching or discharge EARS: Denies change in hearing, ringing in ears, or earache NOSE: Denies nasal congestion or epistaxis MOUTH and THROAT: Denies oral discomfort, gingival pain or bleeding, pain from teeth or hoarseness   RESPIRATORY: no cough, SOB, DOE, wheezing,  hemoptysis CARDIAC: no chest pain, edema or palpitations GI: no abdominal pain, diarrhea, constipation, heart burn, nausea or vomiting GU: Denies dysuria, frequency, hematuria, incontinence, or discharge PSYCHIATRIC: Denies feeling of depression or anxiety. No report of hallucinations, insomnia, paranoia, or agitation   PHYSICAL EXAMINATION  GENERAL APPEARANCE: Well nourished. In no acute distress. Obese SKIN:  Skin is warm and dry.  HEAD: Normal in size and contour. No evidence of trauma EYES: Lids open and close normally. No blepharitis, entropion or ectropion. PERRL. Conjunctivae are clear and sclerae are white. Lenses are without opacity EARS: Pinnae are normal. Patient hears normal voice tunes of the examiner MOUTH and THROAT: Lips are without lesions. Oral mucosa is moist and without lesions. Tongue is normal in shape, size, and color and without lesions NECK: supple, trachea midline, no neck masses, no thyroid tenderness, no thyromegaly LYMPHATICS: no LAN in the neck, no supraclavicular LAN RESPIRATORY: breathing is even & unlabored, BS CTAB CARDIAC: RRR, no murmur,no extra heart sounds, no edema GI: abdomen soft, normal BS, no masses, no tenderness, no hepatomegaly, no splenomegaly EXTREMITIES:  Able to move 4 extremities PSYCHIATRIC: Alert and oriented X 3. Affect and behavior are appropriate  LABS/RADIOLOGY: Labs reviewed: Basic Metabolic Panel:  Recent Labs  10/26/15 1100 10/27/15 0437 10/29/15 0756  NA 133* 136 136  K 3.2* 3.5 3.6  CL 99* 100* 98*  CO2 25 28 28   GLUCOSE 127* 114* 128*  BUN 9 10 15   CREATININE 0.85 0.80 0.98  CALCIUM 8.3* 8.0* 8.3*   Liver Function Tests:  Recent Labs  09/11/15 1000 10/26/15 1100  AST 26 66*  ALT 22 43  ALKPHOS 86 99  BILITOT 0.5 1.7*  PROT 7.2 7.4  ALBUMIN 4.2 3.2*   CBC:  Recent Labs  10/26/15 1100 10/27/15 0437 10/29/15 0756  WBC 13.7* 11.9* 13.0*  NEUTROABS 10.0*  --   --   HGB 12.3 12.4 12.4  HCT 35.4*  36.9 36.5  MCV 84.9 86.0 86.3  PLT 277 275 368   Lipid Panel:  Recent Labs  09/11/15 1000 10/27/15 0437  HDL 59 40*   Cardiac Enzymes:  Recent Labs  10/26/15 1710 10/26/15 2256 10/27/15 0437  TROPONINI <0.03 <0.03 <0.03   CBG:  Recent Labs  10/30/15 0623 10/30/15 0826 10/30/15 1154  GLUCAP 121* 123* 130*      Dg Chest 2 View  10/26/2015  CLINICAL DATA:  Left-sided chest pain and shortness of breath for 5 days. EXAM: CHEST  2 VIEW COMPARISON:  10/21/2015.  01/27/2013. FINDINGS: Stable asymmetric elevation right hemidiaphragm. No focal airspace consolidation. No pulmonary edema or pleural effusion. Right hilar fullness is stable The visualized bony structures of the thorax are intact. IMPRESSION: No active cardiopulmonary disease. Electronically Signed  By: Misty Stanley M.D.   On: 10/26/2015 11:46   Dg Chest 2 View  10/21/2015  CLINICAL DATA:  Chest pain, shortness of breath EXAM: CHEST  2 VIEW COMPARISON:  06/03/2009 FINDINGS: There is bilateral mild perihilar interstitial thickening. There is no focal consolidation. There is no pleural effusion or pneumothorax. The heart and mediastinal contours are unremarkable. The osseous structures are unremarkable. IMPRESSION: Mild bilateral perihilar interstitial thickening as can be seen with bronchitis. Electronically Signed   By: Kathreen Devoid   On: 10/21/2015 22:38   Ct Angio Chest Pe W Or Wo Contrast  10/27/2015  CLINICAL DATA:  Shortness of Breath EXAM: CT ANGIOGRAPHY CHEST WITH CONTRAST TECHNIQUE: Multidetector CT imaging of the chest was performed using the standard protocol during bolus administration of intravenous contrast. Multiplanar CT image reconstructions and MIPs were obtained to evaluate the vascular anatomy. CONTRAST:  100 mL Isovue 370 nonionic COMPARISON:  Chest radiograph October 26, 2015 FINDINGS: Cardiovascular: There is no demonstrable pulmonary embolus. There is atherosclerotic calcification in the aorta but no  appreciable aneurysm or dissection. Visualized great vessels appear unremarkable. Pericardium is not appreciably thickened. Mediastinum/Nodes: Visualized thyroid appears normal. There is no appreciable adenopathy. There is a small hiatal hernia. Lungs/Pleura: There is stable elevation of the right hemidiaphragm. There is airspace consolidation in portions of the medial and posterior segments of the right lower lobe. There is patchy atelectasis in the left lower lobe. Upper Abdomen: There are foci of atherosclerotic calcification in the aorta. Visualized upper abdominal structures otherwise are unremarkable. Musculoskeletal: There is degenerative change in the thoracic spine. There are no blastic or lytic bone lesions. Review of the MIP images confirms the above findings. IMPRESSION: No demonstrable pulmonary embolus. There is infiltrate consistent with pneumonia in portions of the right lower lobe. There is patchy atelectasis in the left lower lobe. There is no appreciable adenopathy. The prominence in the right hilum noted previously is apparently due to vascular prominence as opposed to mass or adenopathy. There are foci of aortic atherosclerotic calcification. No thoracic aortic aneurysm or dissection evident. Small hiatal hernia. Electronically Signed   By: Lowella Grip III M.D.   On: 10/27/2015 12:06   Dg Bone Density  10/09/2015  EXAM: DUAL X-RAY ABSORPTIOMETRY (DXA) FOR BONE MINERAL DENSITY IMPRESSION: Referring Physician:  Lupita Dawn PATIENT: Name: Desiree Parker, Desiree Parker Patient ID: [id number] Birth Date: 07/18/1945 Height: 60.0 in. Sex: Female Measured: 10/09/2015 Weight: 204.0 lbs. Indications: Albuterol, Estrogen Deficient, Hx of tobacco use, Low Calcium Intake (269.3), Omeprazole, Postmenopausal Fractures: None Treatments: None ASSESSMENT: The BMD measured at AP Spine L1-L2 is 0.872 g/cm2 with a T-score of -2.5. This patient's diagnostic category is considered OSTEOPOROSIS according to [place]  Organization [facility]) criteria. L-3, L-4 were excluded due to degenerative changes. Site Region Measured Date Measured Age YA BMD Significant CHANGE T-score AP Spine  L1-L2      11/09/[age]    68.8         -2.5    0.872 g/cm2 DualFemur Neck Right 10[age]09/2015    68.8         -0[age]9    0.906 g/cm2 World Health Organization [facility]) criteria for post-menopausal, Caucasian Women: Normal       T-score at or above -1 SD Osteopenia   T-score between -1 and -2.5 SD Osteoporosis T-score at or below -2.5 SD RECOMMENDATION: [place] recommends that FDA-approved medical therapies be considered in postmenopausal women and men age 48 or older with a: 1. Hip or  vertebral (clinical or morphometric) fracture. 2. T-score of <-2.5 at the spine or hip. 3. Ten-year fracture probability by FRAX of 3% or greater for hip fracture or 20% or greater for major osteoporotic fracture. All treatment decisions require clinical judgment and consideration of individual patient factors, including patient preferences, co-morbidities, previous drug use, risk factors not captured in the FRAX model (e.g. falls, vitamin D deficiency, increased bone turnover, interval significant decline in bone density) and possible under - or over-estimation of fracture risk by FRAX. All patients should ensure an adequate intake of dietary calcium (1200 mg/d) and vitamin D (800 IU daily) unless contraindicated. FOLLOW-UP: People with diagnosed cases of osteoporosis or at high risk for fracture should have regular bone mineral density tests. For patients eligible for Medicare, routine testing is allowed once every 2 years. The testing frequency can be increased to one year for patients who have rapidly progressing disease, those who are receiving or discontinuing medical therapy to restore bone mass, or have additional risk factors. I have reviewed this report, and agree with the above findings. [facility] Radiology Electronically Signed   By: Margarette Canada  M.D.   On: 10/09/2015 13:41   Dg Toe Great Left  10/26/2015  CLINICAL DATA:  Pain and swelling in the great toe bilaterally. EXAM: LEFT GREAT TOE COMPARISON:  Right toe radiographs from the same day. FINDINGS: Moderate osteopenia is present. Hallux valgus deformity is similar to the right foot. There is lateral subluxation of the first MTP joint. No significant erosion is present. No radiopaque foreign body is noted. IMPRESSION: 1. Hallux valgus deformity. 2. Moderate osteopenia. 3. Soft tissue swelling without significant aeration to suggest inflammatory arthritis. Electronically Signed   By: San Morelle M.D.   On: 10/26/2015 15:53   Dg Toe Great Right  10/26/2015  CLINICAL DATA:  Pain. EXAM: RIGHT GREAT TOE COMPARISON:  None. FINDINGS: Marked hallux valgus deformity associated with osteophyte at the first metatarsophalangeal joint. No acute fracture or subluxation. No erosions or soft tissue swelling. IMPRESSION: Marked hallux valgus deformity. Electronically Signed   By: Nolon Nations M.D.   On: 10/26/2015 15:52   Mm Digital Screening Bilateral  10/10/2015  CLINICAL DATA:  Screening. EXAM: DIGITAL SCREENING BILATERAL MAMMOGRAM WITH CAD COMPARISON:  Previous exam(s). ACR Breast Density Category a: The breast tissue is almost entirely fatty. FINDINGS: There are no findings suspicious for malignancy. Images were processed with CAD. IMPRESSION: No mammographic evidence of malignancy. A result letter of this screening mammogram will be mailed directly to the patient. RECOMMENDATION: Screening mammogram in one year. (Code:[id number]-B-01Y) BI-RADS CATEGORY  1: Negative. Electronically Signed   By: Franki Cabot M.D.   On: 10/10/2015 15:09    ASSESSMENT/PLAN:  Physical deconditioning - for rehabilitation, PT and OT  CAP - continue levofloxacin 750 mg by mouth daily 2 more days  Leukocytosis - trending down; check CBC Lab Results  Component Value Date   WBC 13.0* 10/29/2015   Hypertension -  continue lisinopril 10 mg 1 tab by mouth daily; check BMP  COPD - no SOB; continue Proventil when necessary  Diabetes mellitus, type II - diet controlled; check CBG daily X 1 week Lab Results  Component Value Date   HGBA1C 6.5* 10/26/2015   Hyperlipidemia - continue atorvastatin 40 mg 1 tab by mouth daily Lab Results  Component Value Date   CHOL 98 10/27/2015   HDL 40* 10/27/2015   LDLCALC 48 10/27/2015   LDLDIRECT 132* 08/01/2012   TRIG 51 10/27/2015   CHOLHDL  2.5 10/27/2015   GERD - continue Prilosec 20 mg 1 capsule by mouth daily    Goals of care:  Short-term rehabilitation    Durenda Age, NP [facility] [phone]

## 2015-11-03 LAB — BASIC METABOLIC PANEL
BUN: 13 mg/dL (ref 4–21)
Creatinine: 0.7 mg/dL (ref 0.5–1.1)
Glucose: 133 mg/dL
POTASSIUM: 3.9 mmol/L (ref 3.4–5.3)
SODIUM: 137 mmol/L (ref 137–147)

## 2015-11-03 LAB — CBC AND DIFFERENTIAL
HEMATOCRIT: 36 % (ref 36–46)
HEMOGLOBIN: 11.6 g/dL — AB (ref 12.0–16.0)
Platelets: 366 10*3/uL (ref 150–399)
WBC: 11.4 10^3/mL

## 2015-11-05 ENCOUNTER — Encounter: Payer: Self-pay | Admitting: Internal Medicine

## 2015-11-05 ENCOUNTER — Non-Acute Institutional Stay (SKILLED_NURSING_FACILITY): Payer: Medicare HMO | Admitting: Internal Medicine

## 2015-11-05 DIAGNOSIS — I10 Essential (primary) hypertension: Secondary | ICD-10-CM

## 2015-11-05 DIAGNOSIS — E119 Type 2 diabetes mellitus without complications: Secondary | ICD-10-CM | POA: Diagnosis not present

## 2015-11-05 DIAGNOSIS — N390 Urinary tract infection, site not specified: Secondary | ICD-10-CM

## 2015-11-05 DIAGNOSIS — K219 Gastro-esophageal reflux disease without esophagitis: Secondary | ICD-10-CM

## 2015-11-05 DIAGNOSIS — J449 Chronic obstructive pulmonary disease, unspecified: Secondary | ICD-10-CM

## 2015-11-05 DIAGNOSIS — E785 Hyperlipidemia, unspecified: Secondary | ICD-10-CM | POA: Diagnosis not present

## 2015-11-05 DIAGNOSIS — M810 Age-related osteoporosis without current pathological fracture: Secondary | ICD-10-CM | POA: Diagnosis not present

## 2015-11-05 DIAGNOSIS — J45909 Unspecified asthma, uncomplicated: Secondary | ICD-10-CM

## 2015-11-05 DIAGNOSIS — D72829 Elevated white blood cell count, unspecified: Secondary | ICD-10-CM

## 2015-11-05 DIAGNOSIS — M7989 Other specified soft tissue disorders: Secondary | ICD-10-CM | POA: Diagnosis not present

## 2015-11-05 DIAGNOSIS — R5381 Other malaise: Secondary | ICD-10-CM

## 2015-11-05 DIAGNOSIS — J189 Pneumonia, unspecified organism: Secondary | ICD-10-CM

## 2015-11-05 NOTE — Progress Notes (Signed)
LOCATION:  [facility]  PCP: Lupita Dawn, MD   Code Status: Full Code  Goals of care: Advanced Directive information Advanced Directives 10/26/2015  Does patient have an advance directive? No  Would patient like information on creating an advanced directive? No - patient declined information       Extended Emergency Contact Information Primary Emergency Contact: Klos,Raymond Address: [address]          [place], [place] [address] [name] Phone: [phone] Mobile Phone: [phone] Relation: Son Secondary Emergency Contact: Reinaldo Raddle [place] of [place] Phone: [phone] Relation: Sister   No Known Allergies  Chief Complaint  Patient presents with  . New Admit To SNF    New Admission     HPI:  Patient is a 70 y.o. female seen today for short term rehabilitation post hospital admission from 10/26/15-10/30/15 with right lower lobe pneumonia and urinary tract infection. She was started on antibiotics. She has medical history of  hypertension, hyperlipidemia, type 2 diabetes mellitus, COPD among others.   Review of Systems:  Constitutional: Negative for fever, chills. Feels tired.  HENT: Negative for headache, congestion, nasal discharge Eyes: Negative for blurred vision  Respiratory: positive for dry cough and some dyspnea with exertion. Negative for wheezing.   Cardiovascular: Negative for chest pain Gastrointestinal: Negative for heartburn, nausea, vomiting, abdominal pain. had a bowel movement this am Genitourinary: Negative for dysuria Musculoskeletal: Negative for fall.  Skin: Negative for itching, rash.  Neurological: Negative for dizziness. Psychiatric/Behavioral: Negative for depression    Past Medical History:  Diagnosis Date  . Alternating exotropia   . Arterial insufficiency (HCC) 02/04/2004   ABI 0.84  . CAP (community acquired pneumonia) 10/2015  . COPD (chronic obstructive  pulmonary disease) ([facility])   . Degenerative disk disease 10/2005   Lumbar, anterolithesis, L4-5,5-S1  . Diabetes mellitus without complication ([facility])   . Hypertension   . 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 Shortness of breath dyspnea    Past Surgical History:  Procedure Laterality Date  . CESAREAN SECTION  1975  . CHOLECYSTECTOMY  1975   states gallstones removed  . TUBAL LIGATION  1975   Social History:   reports that she quit smoking about 27 years ago. Her smoking use included Cigarettes. She has a 54.00 pack-year smoking history. Her smokeless tobacco use includes Snuff. She reports that she drinks about 1.0 oz of alcohol per week . She reports that she does not use drugs.  Family History  Problem Relation Age of Onset  . Diabetes Father   . Hypertension Mother   . Cancer Mother     lung  . Diabetes Mother   . Hypertension Sister   . Asthma Son     Medications:   Medication List       Accurate as of 11/05/15  2:42 PM. Always use your most recent med list.          acetaminophen 325 MG tablet Commonly known as:  TYLENOL Take 650 mg by mouth every 4 (four) hours as needed for mild pain.   albuterol 108 (90 Base) MCG/ACT inhaler Commonly known as:  PROVENTIL HFA;VENTOLIN HFA Inhale 2 puffs into the lungs every 6 (six) hours as needed for wheezing.   alendronate 70 MG tablet Commonly known as:  FOSAMAX Take 1 tablet (70 mg total) by mouth every 7 (seven) days. Take with a full glass of water on an  empty stomach.   aspirin 81 MG EC tablet Take 81 mg by mouth daily.   atorvastatin 40 MG tablet Commonly known as:  LIPITOR Take 40 mg by mouth daily.   Calcium Carb-Cholecalciferol 500-400 MG-UNIT Tabs Commonly known as:  CALCIUM 500+D3 Take 2 tablets by mouth every morning.   ipratropium-albuterol 0.5-2.5 (3) MG/3ML Soln Commonly known as:  DUONEB Take 3 mLs by nebulization every 6 (six) hours as needed (Cough/SOB).   lidocaine 5 % ointment Commonly known  as:  XYLOCAINE Apply 1 application topically as needed.   lisinopril 10 MG tablet Commonly known as:  PRINIVIL,ZESTRIL Take 10 mg by mouth daily.   omeprazole 20 MG capsule Commonly known as:  PRILOSEC Take 1 capsule (20 mg total) by mouth daily.       Immunizations: Immunization History  Administered Date(s) Administered  . Influenza Whole 02/21/2007, 02/13/2008, 01/28/2009, 01/13/2010  . Pneumococcal Conjugate-13 10/03/2015  . Pneumococcal Polysaccharide-23 07/18/2007, 08/01/2012  . Td 04/12/2005     Physical Exam:  BP 135/61   Pulse 82   Temp 99.3 F (37.4 C)   Resp 16   SpO2 92%   General- elderly female, \in no acute distress Head- normocephalic, atraumatic Nose- no nasal discharge Throat- moist mucus membrane Eyes- PERRLA, EOMI, no pallor, no icterus, no discharge Neck- no cervical lymphadenopathy Cardiovascular- normal s1,s2 Respiratory- decreased air entry, no wheeze, no rhonchi, no crackles, no use of accessory muscles Abdomen- bowel sounds present, soft, non tender Musculoskeletal- able to move all 4 extremities, generalized weakness, bilateral foot swelling Neurological- alert and oriented to person, place and time Skin- warm and dry Psychiatry- normal mood and affect    Labs reviewed: Basic Metabolic Panel:  Recent Labs  10/26/15 1100 10/27/15 0437 10/29/15 0756 11/03/15  NA 133* 136 136 137  K 3.2* 3.5 3.6 3.9  CL 99* 100* 98*  --   CO2 25 28 28   --   GLUCOSE 127* 114* 128*  --   BUN 9 10 15 13   CREATININE 0.85 0.80 0.98 0.7  CALCIUM 8.3* 8.0* 8.3*  --    Liver Function Tests:  Recent Labs  09/11/15 1000 10/26/15 1100  AST 26 66*  ALT 22 43  ALKPHOS 86 99  BILITOT 0.5 1.7*  PROT 7.2 7.4  ALBUMIN 4.2 3.2*   No results for input(s): LIPASE, AMYLASE in the last 8760 hours. No results for input(s): AMMONIA in the last 8760 hours. CBC:  Recent Labs  10/26/15 1100 10/27/15 0437 10/29/15 0756 11/03/15  WBC 13.7* 11.9* 13.0*  11.4  NEUTROABS 10.0*  --   --   --   HGB 12.3 12.4 12.4 11.6*  HCT 35.4* 36.9 36.5 36  MCV 84.9 86.0 86.3  --   PLT 277 275 368 366   Cardiac Enzymes:  Recent Labs  10/26/15 1710 10/26/15 2256 10/27/15 0437  TROPONINI <0.03 <0.03 <0.03   BNP: Invalid input(s): POCBNP CBG:  Recent Labs  10/30/15 0623 10/30/15 0826 10/30/15 1154  GLUCAP 121* 123* 130*    Radiological Exams: Dg Chest 2 View  Result Date: 10/26/2015 CLINICAL DATA:  Left-sided chest pain and shortness of breath for 5 days. EXAM: CHEST  2 VIEW COMPARISON:  10/21/2015.  01/27/2013. FINDINGS: Stable asymmetric elevation right hemidiaphragm. No focal airspace consolidation. No pulmonary edema or pleural effusion. Right hilar fullness is stable The visualized bony structures of the thorax are intact. IMPRESSION: No active cardiopulmonary disease. Electronically Signed   By: Misty Stanley M.D.   On: 10/26/2015  11:46   Dg Chest 2 View  Result Date: 10/21/2015 CLINICAL DATA:  Chest pain, shortness of breath EXAM: CHEST  2 VIEW COMPARISON:  06/03/2009 FINDINGS: There is bilateral mild perihilar interstitial thickening. There is no focal consolidation. There is no pleural effusion or pneumothorax. The heart and mediastinal contours are unremarkable. The osseous structures are unremarkable. IMPRESSION: Mild bilateral perihilar interstitial thickening as can be seen with bronchitis. Electronically Signed   By: Kathreen Devoid   On: 10/21/2015 22:38   Ct Angio Chest Pe W Or Wo Contrast  Result Date: 10/27/2015 CLINICAL DATA:  Shortness of Breath EXAM: CT ANGIOGRAPHY CHEST WITH CONTRAST TECHNIQUE: Multidetector CT imaging of the chest was performed using the standard protocol during bolus administration of intravenous contrast. Multiplanar CT image reconstructions and MIPs were obtained to evaluate the vascular anatomy. CONTRAST:  100 mL Isovue 370 nonionic COMPARISON:  Chest radiograph October 26, 2015 FINDINGS: Cardiovascular: There  is no demonstrable pulmonary embolus. There is atherosclerotic calcification in the aorta but no appreciable aneurysm or dissection. Visualized great vessels appear unremarkable. Pericardium is not appreciably thickened. Mediastinum/Nodes: Visualized thyroid appears normal. There is no appreciable adenopathy. There is a small hiatal hernia. Lungs/Pleura: There is stable elevation of the right hemidiaphragm. There is airspace consolidation in portions of the medial and posterior segments of the right lower lobe. There is patchy atelectasis in the left lower lobe. Upper Abdomen: There are foci of atherosclerotic calcification in the aorta. Visualized upper abdominal structures otherwise are unremarkable. Musculoskeletal: There is degenerative change in the thoracic spine. There are no blastic or lytic bone lesions. Review of the MIP images confirms the above findings. IMPRESSION: No demonstrable pulmonary embolus. There is infiltrate consistent with pneumonia in portions of the right lower lobe. There is patchy atelectasis in the left lower lobe. There is no appreciable adenopathy. The prominence in the right hilum noted previously is apparently due to vascular prominence as opposed to mass or adenopathy. There are foci of aortic atherosclerotic calcification. No thoracic aortic aneurysm or dissection evident. Small hiatal hernia. Electronically Signed   By: Lowella Grip III M.D.   On: 10/27/2015 12:06   Dg Bone Density  Result Date: 10/09/2015 EXAM: DUAL X-RAY ABSORPTIOMETRY (DXA) FOR BONE MINERAL DENSITY IMPRESSION: Referring Physician:  Lupita Dawn PATIENT: Name: Desiree Parker, Desiree Parker Patient ID: [id number] Birth Date: 04/29/1945 Height: 60.0 in. Sex: Female Measured: 10/09/2015 Weight: 204.0 lbs. Indications: Albuterol, Estrogen Deficient, Hx of tobacco use, Low Calcium Intake (269.3), Omeprazole, Postmenopausal Fractures: None Treatments: None ASSESSMENT: The BMD measured at AP Spine L1-L2 is 0.872 g/cm2 with  a T-score of -2.5. This patient's diagnostic category is considered OSTEOPOROSIS according to [facility]) criteria. L-3, L-4 were excluded due to degenerative changes. Site Region Measured Date Measured Age YA BMD Significant CHANGE T-score AP Spine  L1-L2      11/09[age]    68.8         -2.5    0.872 g/cm2 DualFemur Neck Right 10[age]09/2015    68.8         -0[age]9    0.906 g/cm2 World Health Organization [facility]) criteria for post-menopausal, Caucasian Women: Normal       T-score at or above -1 SD Osteopenia   T-score between -1 and -2.5 SD Osteoporosis T-score at or below -2.5 SD RECOMMENDATION: [facility] recommends that FDA-approved medical therapies be considered in postmenopausal women and men age 20 or older with a: 1. Hip or vertebral (clinical or morphometric) fracture. 2.  T-score of <-2.5 at the spine or hip. 3. Ten-year fracture probability by FRAX of 3% or greater for hip fracture or 20% or greater for major osteoporotic fracture. All treatment decisions require clinical judgment and consideration of individual patient factors, including patient preferences, co-morbidities, previous drug use, risk factors not captured in the FRAX model (e.g. falls, vitamin D deficiency, increased bone turnover, interval significant decline in bone density) and possible under - or over-estimation of fracture risk by FRAX. All patients should ensure an adequate intake of dietary calcium (1200 mg/d) and vitamin D (800 IU daily) unless contraindicated. FOLLOW-UP: People with diagnosed cases of osteoporosis or at high risk for fracture should have regular bone mineral density tests. For patients eligible for Medicare, routine testing is allowed once every 2 years. The testing frequency can be increased to one year for patients who have rapidly progressing disease, those who are receiving or discontinuing medical therapy to restore bone mass, or have additional risk factors. I have reviewed  this report, and agree with the above findings. [facility] Electronically Signed   By: Margarette Canada M.D.   On: 10/09/2015 13:41   Dg Toe Great Left  Result Date: 10/26/2015 CLINICAL DATA:  Pain and swelling in the great toe bilaterally. EXAM: LEFT GREAT TOE COMPARISON:  Right toe radiographs from the same day. FINDINGS: Moderate osteopenia is present. Hallux valgus deformity is similar to the right foot. There is lateral subluxation of the first MTP joint. No significant erosion is present. No radiopaque foreign body is noted. IMPRESSION: 1. Hallux valgus deformity. 2. Moderate osteopenia. 3. Soft tissue swelling without significant aeration to suggest inflammatory arthritis. Electronically Signed   By: San Morelle M.D.   On: 10/26/2015 15:53   Dg Toe Great Right  Result Date: 10/26/2015 CLINICAL DATA:  Pain. EXAM: RIGHT GREAT TOE COMPARISON:  None. FINDINGS: Marked hallux valgus deformity associated with osteophyte at the first metatarsophalangeal joint. No acute fracture or subluxation. No erosions or soft tissue swelling. IMPRESSION: Marked hallux valgus deformity. Electronically Signed   By: Nolon Nations M.D.   On: 10/26/2015 15:52   Mm Digital Screening Bilateral  Result Date: 10/10/2015 CLINICAL DATA:  Screening. EXAM: DIGITAL SCREENING BILATERAL MAMMOGRAM WITH CAD COMPARISON:  Previous exam(s). ACR Breast Density Category a: The breast tissue is almost entirely fatty. FINDINGS: There are no findings suspicious for malignancy. Images were processed with CAD. IMPRESSION: No mammographic evidence of malignancy. A result letter of this screening mammogram will be mailed directly to the patient. RECOMMENDATION: Screening mammogram in one year. (Code:SM-B-01Y) BI-RADS CATEGORY  1: Negative. Electronically Signed   By: Franki Cabot M.D.   On: 10/10/2015 15:09    Assessment/Plan   Physical deconditioning Will have her work with physical therapy and occupational therapy team  to help with gait training and muscle strengthening exercises.fall precautions. Skin care. Encourage to be out of bed.   CAP Has completed her levquin course. Encouraged to use incentive spirometer.   UTI Has completed her antibiotics. Hydration to be maintained.   Leukocytosis Resolved, has been treated for PNA and UTI  Foot swelling Keep legs elevated at rest, add ted hose  COPD Monitor her breathing, continue bronchodilators  Hypertension continue lisinopril 10 mg daily  DM type 2 Monitor cbg, diet controlled. Continue statin and baby aspirin  gerd Stable, continue prilosec  HLD Continue atorvastatin  Osteoporosis Continue weekly fosamax and her calcium and vitamin d supplement   Goals of care: short term rehabilitation   Labs/tests ordered:  cbc, cmp  Family/ staff Communication: reviewed care plan with patient and nursing supervisor    Blanchie Serve, MD Internal Medicine [place], [place] [address] Cell Phone (Monday-Friday 8 am - 5 pm): [phone] On Call: [phone] and follow prompts after 5 pm and on weekends Office Phone: [phone] Office Fax: [phone]

## 2015-11-10 LAB — CBC AND DIFFERENTIAL
HEMATOCRIT: 37 % (ref 36–46)
Hemoglobin: 11.9 g/dL — AB (ref 12.0–16.0)
PLATELETS: 517 10*3/uL — AB (ref 150–399)
WBC: 10.4 10*3/mL

## 2015-11-10 LAB — BASIC METABOLIC PANEL
BUN: 10 mg/dL (ref 4–21)
Creatinine: 0.7 mg/dL (ref 0.5–1.1)
GLUCOSE: 128 mg/dL
Potassium: 4.3 mmol/L (ref 3.4–5.3)
Sodium: 140 mmol/L (ref 137–147)

## 2015-11-10 LAB — HEPATIC FUNCTION PANEL
ALK PHOS: 83 U/L (ref 25–125)
ALT: 18 U/L (ref 7–35)
AST: 25 U/L (ref 13–35)
BILIRUBIN, TOTAL: 0.7 mg/dL

## 2015-11-18 ENCOUNTER — Non-Acute Institutional Stay (SKILLED_NURSING_FACILITY): Payer: Medicare HMO | Admitting: Adult Health

## 2015-11-18 ENCOUNTER — Encounter: Payer: Self-pay | Admitting: Adult Health

## 2015-11-18 DIAGNOSIS — J449 Chronic obstructive pulmonary disease, unspecified: Secondary | ICD-10-CM | POA: Diagnosis not present

## 2015-11-18 DIAGNOSIS — J189 Pneumonia, unspecified organism: Secondary | ICD-10-CM

## 2015-11-18 DIAGNOSIS — E119 Type 2 diabetes mellitus without complications: Secondary | ICD-10-CM | POA: Diagnosis not present

## 2015-11-18 DIAGNOSIS — J45909 Unspecified asthma, uncomplicated: Secondary | ICD-10-CM | POA: Diagnosis not present

## 2015-11-18 DIAGNOSIS — K219 Gastro-esophageal reflux disease without esophagitis: Secondary | ICD-10-CM

## 2015-11-18 DIAGNOSIS — R5381 Other malaise: Secondary | ICD-10-CM | POA: Diagnosis not present

## 2015-11-18 DIAGNOSIS — E785 Hyperlipidemia, unspecified: Secondary | ICD-10-CM

## 2015-11-18 DIAGNOSIS — I10 Essential (primary) hypertension: Secondary | ICD-10-CM

## 2015-11-18 DIAGNOSIS — J4489 Other specified chronic obstructive pulmonary disease: Secondary | ICD-10-CM

## 2015-11-18 NOTE — Progress Notes (Signed)
Patient ID: Desiree Parker, female   DOB: May 10, 1945, 70 y.o.   MRN: [id number]    DATE:  11/18/2015   MRN:  [id number]  BIRTHDAY: 1945-10-31  Facility:  Nursing Home Location:  [place] Room Number: 1103-A  LEVEL OF CARE:  SNF [phone])  Contact Information    Name Relation Home Work Mobile   Desiree Parker [phone]  [phone]   Desiree Parker [phone]         Code Status History    Date Active Date Inactive Code Status Order ID Comments User Context   10/26/2015  3:37 PM 10/30/2015  7:33 PM DNR [id number]  Desiree Barrack, MD ED    Questions for Most Recent Historical Code Status (Order [id number])    Question Answer Comment   In the event of cardiac or respiratory ARREST Do not call a "code blue"    In the event of cardiac or respiratory ARREST Do not perform Intubation, CPR, defibrillation or ACLS    In the event of cardiac or respiratory ARREST Use medication by any route, position, wound care, and other measures to relive pain and suffering. May use oxygen, suction and manual treatment of airway obstruction as needed for comfort.         Chief Complaint  Patient presents with  . Discharge Note    HISTORY OF PRESENT ILLNESS:  This is a 70 year old female whois for discharge home with Home health PT, OT, CNA, Nursing and Nutritionist. DME:  3-in-1 commode. Patient will discharge home with medications.  She has been admitted to [facility] on 10/30/15 from [facility] has PMH of hypertension, hyperlipidemia, type 2 diabetes mellitus, COPD and hallux valgus. She was treated in the hospital for CAP. She was started on azithromycin and ceftriaxone and was transitioned to levofloxacin 750 mg . Her oxygen was weaned off and her O2 saturation was maintaining in the mid 90s. She was discharged from the hospitalwith levofloxacin 750 mg 2 more days  Patient was admitted to this facility for short-term rehabilitation after the  patient's recent hospitalization.  Patient has completed SNF rehabilitation and therapy has cleared the patient for discharge.  PAST MEDICAL HISTORY:  Past Medical History:  Diagnosis Date  . Alternating exotropia   . Arterial insufficiency (HCC) 02/04/2004   ABI 0.84  . CAP (community acquired pneumonia) 10/2015  . COPD (chronic obstructive pulmonary disease) ([place])   . Degenerative disk disease 10/2005   Lumbar, anterolithesis, L4-5,5-S1  . Diabetes mellitus without complication ([place])   . Hypertension   . 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 Shortness of breath dyspnea      CURRENT MEDICATIONS: Reviewed  Patient's Medications  New Prescriptions   No medications on file  Previous Medications   ACETAMINOPHEN (TYLENOL) 325 MG TABLET    Take 650 mg by mouth every 4 (four) hours as needed for mild pain.   ALBUTEROL (PROVENTIL HFA;VENTOLIN HFA) 108 (90 BASE) MCG/ACT INHALER    Inhale 2 puffs into the lungs every 6 (six) hours as needed for wheezing.   ALENDRONATE (FOSAMAX) 70 MG TABLET    Take 1 tablet (70 mg total) by mouth every 7 (seven) days. Take with a full glass of water on an empty stomach.   ASPIRIN 81 MG EC TABLET    Take 81 mg by mouth daily.    ATORVASTATIN (LIPITOR) 40 MG TABLET    Take 40 mg by mouth daily.    CALCIUM CARB-CHOLECALCIFEROL (CALCIUM 500+D3)  500-400 MG-UNIT TABS    Take 2 tablets by mouth every morning.   IPRATROPIUM-ALBUTEROL (DUONEB) 0.5-2.5 (3) MG/3ML SOLN    Take 3 mLs by nebulization every 6 (six) hours as needed (Cough/SOB).   LIDOCAINE (XYLOCAINE) 5 % OINTMENT    Apply 1 application topically as needed.   LISINOPRIL (PRINIVIL,ZESTRIL) 10 MG TABLET    Take 10 mg by mouth daily.   OMEPRAZOLE (PRILOSEC) 20 MG CAPSULE    Take 1 capsule (20 mg total) by mouth daily.  Modified Medications   No medications on file  Discontinued Medications   No medications on file     No Known Allergies   REVIEW OF SYSTEMS:  GENERAL: no change in appetite, no  fatigue, no weight changes, no fever, chills or weakness EYES: Denies change in vision, dry eyes, eye pain, itching or discharge EARS: Denies change in hearing, ringing in ears, or earache NOSE: Denies nasal congestion or epistaxis MOUTH and THROAT: Denies oral discomfort, gingival pain or bleeding, pain from teeth or hoarseness   RESPIRATORY: no cough, SOB, DOE, wheezing, hemoptysis CARDIAC: no chest pain, edema or palpitations GI: no abdominal pain, diarrhea, constipation, heart burn, nausea or vomiting GU: Denies dysuria, frequency, hematuria, incontinence, or discharge PSYCHIATRIC: Denies feeling of depression or anxiety. No report of hallucinations, insomnia, paranoia, or agitation   PHYSICAL EXAMINATION  GENERAL APPEARANCE: Well nourished. In no acute distress. Obese SKIN:  Skin is warm and dry.  HEAD: Normal in size and contour. No evidence of trauma EYES: Lids open and close normally. No blepharitis, entropion or ectropion. PERRL. Conjunctivae are clear and sclerae are white. Lenses are without opacity EARS: Pinnae are normal. Patient hears normal voice tunes of the examiner MOUTH and THROAT: Lips are without lesions. Oral mucosa is moist and without lesions. Tongue is normal in shape, size, and color and without lesions NECK: supple, trachea midline, no neck masses, no thyroid tenderness, no thyromegaly LYMPHATICS: no LAN in the neck, no supraclavicular LAN RESPIRATORY: breathing is even & unlabored, BS CTAB CARDIAC: RRR, no murmur,no extra heart sounds, LLE 1+ edema GI: abdomen soft, normal BS, no masses, no tenderness, no hepatomegaly, no splenomegaly EXTREMITIES:  Able to move 4 extremities PSYCHIATRIC: Alert and oriented X 3. Affect and behavior are appropriate  LABS/RADIOLOGY: Labs reviewed: Basic Metabolic Panel:  Recent Labs  10/26/15 1100 10/27/15 0437 10/29/15 0756 11/03/15 11/10/15 1311  NA 133* 136 136 137 140  K 3.2* 3.5 3.6 3.9 4.3  CL 99* 100* 98*  --    --   CO2 25 28 28   --   --   GLUCOSE 127* 114* 128*  --   --   BUN 9 10 15 13 10   CREATININE 0.85 0.80 0.98 0.7 0.7  CALCIUM 8.3* 8.0* 8.3*  --   --    Liver Function Tests:  Recent Labs  09/11/15 1000 10/26/15 1100 11/10/15 1311  AST 26 66* 25  ALT 22 43 18  ALKPHOS 86 99 83  BILITOT 0.5 1.7*  --   PROT 7.2 7.4  --   ALBUMIN 4.2 3.2*  --    CBC:  Recent Labs  10/26/15 1100 10/27/15 0437 10/29/15 0756 11/03/15 11/10/15 1311  WBC 13.7* 11.9* 13.0* 11.4 10.4  NEUTROABS 10.0*  --   --   --   --   HGB 12.3 12.4 12.4 11.6* 11.9*  HCT 35.4* 36.9 36.5 36 37  MCV 84.9 86.0 86.3  --   --   PLT 277 275  368 366 517*   Lipid Panel:  Recent Labs  09/11/15 1000 10/27/15 0437  HDL 59 40*   Cardiac Enzymes:  Recent Labs  10/26/15 1710 10/26/15 2256 10/27/15 0437  TROPONINI <0.03 <0.03 <0.03   CBG:  Recent Labs  10/30/15 0623 10/30/15 0826 10/30/15 1154  GLUCAP 121* 123* 130*      Dg Chest 2 View  Result Date: 10/26/2015 CLINICAL DATA:  Left-sided chest pain and shortness of breath for 5 days. EXAM: CHEST  2 VIEW COMPARISON:  10/21/2015.  01/27/2013. FINDINGS: Stable asymmetric elevation right hemidiaphragm. No focal airspace consolidation. No pulmonary edema or pleural effusion. Right hilar fullness is stable The visualized bony structures of the thorax are intact. IMPRESSION: No active cardiopulmonary disease. Electronically Signed   By: Misty Stanley M.D.   On: 10/26/2015 11:46   Dg Chest 2 View  Result Date: 10/21/2015 CLINICAL DATA:  Chest pain, shortness of breath EXAM: CHEST  2 VIEW COMPARISON:  06/03/2009 FINDINGS: There is bilateral mild perihilar interstitial thickening. There is no focal consolidation. There is no pleural effusion or pneumothorax. The heart and mediastinal contours are unremarkable. The osseous structures are unremarkable. IMPRESSION: Mild bilateral perihilar interstitial thickening as can be seen with bronchitis. Electronically Signed    By: Kathreen Devoid   On: 10/21/2015 22:38   Ct Angio Chest Pe W Or Wo Contrast  Result Date: 10/27/2015 CLINICAL DATA:  Shortness of Breath EXAM: CT ANGIOGRAPHY CHEST WITH CONTRAST TECHNIQUE: Multidetector CT imaging of the chest was performed using the standard protocol during bolus administration of intravenous contrast. Multiplanar CT image reconstructions and MIPs were obtained to evaluate the vascular anatomy. CONTRAST:  100 mL Isovue 370 nonionic COMPARISON:  Chest radiograph October 26, 2015 FINDINGS: Cardiovascular: There is no demonstrable pulmonary embolus. There is atherosclerotic calcification in the aorta but no appreciable aneurysm or dissection. Visualized great vessels appear unremarkable. Pericardium is not appreciably thickened. Mediastinum/Nodes: Visualized thyroid appears normal. There is no appreciable adenopathy. There is a small hiatal hernia. Lungs/Pleura: There is stable elevation of the right hemidiaphragm. There is airspace consolidation in portions of the medial and posterior segments of the right lower lobe. There is patchy atelectasis in the left lower lobe. Upper Abdomen: There are foci of atherosclerotic calcification in the aorta. Visualized upper abdominal structures otherwise are unremarkable. Musculoskeletal: There is degenerative change in the thoracic spine. There are no blastic or lytic bone lesions. Review of the MIP images confirms the above findings. IMPRESSION: No demonstrable pulmonary embolus. There is infiltrate consistent with pneumonia in portions of the right lower lobe. There is patchy atelectasis in the left lower lobe. There is no appreciable adenopathy. The prominence in the right hilum noted previously is apparently due to vascular prominence as opposed to mass or adenopathy. There are foci of aortic atherosclerotic calcification. No thoracic aortic aneurysm or dissection evident. Small hiatal hernia. Electronically Signed   By: Lowella Grip III M.D.   On:  10/27/2015 12:06   Dg Toe Great Left  Result Date: 10/26/2015 CLINICAL DATA:  Pain and swelling in the great toe bilaterally. EXAM: LEFT GREAT TOE COMPARISON:  Right toe radiographs from the same day. FINDINGS: Moderate osteopenia is present. Hallux valgus deformity is similar to the right foot. There is lateral subluxation of the first MTP joint. No significant erosion is present. No radiopaque foreign body is noted. IMPRESSION: 1. Hallux valgus deformity. 2. Moderate osteopenia. 3. Soft tissue swelling without significant aeration to suggest inflammatory arthritis. Electronically Signed   By:  San Morelle M.D.   On: 10/26/2015 15:53   Dg Toe Great Right  Result Date: 10/26/2015 CLINICAL DATA:  Pain. EXAM: RIGHT GREAT TOE COMPARISON:  None. FINDINGS: Marked hallux valgus deformity associated with osteophyte at the first metatarsophalangeal joint. No acute fracture or subluxation. No erosions or soft tissue swelling. IMPRESSION: Marked hallux valgus deformity. Electronically Signed   By: Nolon Nations M.D.   On: 10/26/2015 15:52    ASSESSMENT/PLAN:  Physical deconditioning - for Home health PT, CNA, Nursing, Nutritionist and OT  CAP - resolved  Leukocytosis - resolved Lab Results  Component Value Date   WBC 10.4 11/10/2015   Hypertension - well-controlled; continue lisinopril 10 mg 1 tab by mouth daily  COPD - no SOB; continue Proventil when necessary  Diabetes mellitus, type II - diet controlled; will have Home health Nutritionist to follow-up Lab Results  Component Value Date   HGBA1C 6.5 (H) 10/26/2015   Hyperlipidemia - continue atorvastatin 40 mg 1 tab by mouth daily Lab Results  Component Value Date   CHOL 98 10/27/2015   HDL 40 (L) 10/27/2015   LDLCALC 48 10/27/2015   LDLDIRECT 132 (H) 08/01/2012   TRIG 51 10/27/2015   CHOLHDL 2.5 10/27/2015   GERD - continue Prilosec 20 mg 1 capsule by mouth daily      I have filled out patient's discharge paperwork  and written prescriptions.  Patient will receive home health PT, OT, ST, Nursing, Nutritionist and CNA.  DME provided:  3-in-1 commode  Total discharge time: Greater than 30 minutes  Discharge time involved coordination of the discharge process with social worker, nursing staff and therapy department. Medical justification for home health services/DME verified.     Durenda Age, NP [employer] [phone]

## 2015-11-19 ENCOUNTER — Other Ambulatory Visit: Payer: Self-pay | Admitting: Adult Health

## 2015-11-24 NOTE — Progress Notes (Signed)
Physical Therapy Evaluation Addendum for G-codes    11/12/2015 1054  PT G-Codes **NOT FOR INPATIENT CLASS**  Functional Assessment Tool Used clinical judgement  Functional Limitation Mobility: Walking and moving around  Mobility: Walking and Moving Around Current Status [phone]) CJ  Mobility: Walking and Moving Around Goal Status [phone]) CI    11/24/2015 Barry Brunner, PT Pager: [phone]

## 2015-11-27 ENCOUNTER — Encounter: Payer: Self-pay | Admitting: Family Medicine

## 2015-11-27 ENCOUNTER — Ambulatory Visit: Payer: Medicare HMO | Admitting: Family Medicine

## 2015-11-27 ENCOUNTER — Ambulatory Visit (INDEPENDENT_AMBULATORY_CARE_PROVIDER_SITE_OTHER): Payer: Medicare HMO | Admitting: Family Medicine

## 2015-11-27 DIAGNOSIS — J189 Pneumonia, unspecified organism: Secondary | ICD-10-CM

## 2015-11-27 DIAGNOSIS — R2681 Unsteadiness on feet: Secondary | ICD-10-CM

## 2015-11-27 DIAGNOSIS — I10 Essential (primary) hypertension: Secondary | ICD-10-CM | POA: Diagnosis not present

## 2015-11-27 DIAGNOSIS — I5032 Chronic diastolic (congestive) heart failure: Secondary | ICD-10-CM | POA: Diagnosis not present

## 2015-11-27 DIAGNOSIS — Z0000 Encounter for general adult medical examination without abnormal findings: Secondary | ICD-10-CM

## 2015-11-27 NOTE — Assessment & Plan Note (Signed)
Debility/Unsteady gait improved after SNF stay. -continue home PT

## 2015-11-27 NOTE — Progress Notes (Signed)
   Subjective:    Patient ID: Desiree Parker, female    DOB: July 19, 1945, 70 y.o.   MRN: [id number]  HPI 70 y/o female presents for hospital follow up.  Admitted 10/26/15 - 10/30/15 with RLL PNA. Initially treated with Rocephin and Azithromycin. Completed 5 total days of antibiotics with Levaquin after discharge. Patient was also evaluated by PT and determined to qualify for acute rehab after discharge. She was discharged to [facility]. Reviewed Hospital Discharge Summary and [facility] Discharge Summary. Per Physician note from [facility] Ms. Krome did well with her rehabilitation and was stable for discharge on 11/18/15. Patient to receive home health services.   PNA/Dyspnea - back to baseline, no sob, able to walk/ambulate without sob when walking at slow pace  Debility - has PT coming to home twice weekly, uses walker to ambulate, walks around her building 4x per day  HTN - taking Lisinopril 10 mg daily, no chest pain  Grade I Diastolic CHF - diagnosed on Echo during hospitalization, leg swelling improved, uses one pillow at night  Social - recently celebrated birthday with family  Health Maintenance: Due for foot exam, eye exam, Tdap, Colonoscopy   Review of Systems     Objective:   Physical Exam Chief Complaint    Follow-up    BP (!) 146/56   Pulse 84   Temp 98.2 F (36.8 C) (Oral)   Ht 5\' 5"  (1.651 m)   Wt 191 lb 12.8 oz (87 kg)   BMI 31.92 kg/m  Gen: pleasant, well appearing female Cardiac: RRR, S1 and S2 present, no murmurs Resp: CTAB, normal effort Ext: trace edema Foot Exam: feet well perfused, warm, calluses on both feet, no skin breakdown, sensation intact to monofilament   Reviewed BMP, CBC, and LFT's from 11/10/15 Reviewed Echo from last hospitalization    Assessment & Plan:  CAP (community acquired pneumonia) Hospital follow up for CAP. Resolved.  -no further treatment -up to date on PNA vaccine  Chronic diastolic heart failure (HCC) Diastolic CHF  (Grade 1). No current symptoms other than trace edema. On ACE-I. No need for diuretics at this time. -monitor symptoms  HYPERTENSION, BENIGN SYSTEMIC Not quite at JNC-8 goals for DM. However since DM well controlled will hold on increasing Lisinopril as patient is a risk for falls.    Unsteady gait Debility/Unsteady gait improved after SNF stay. -continue home PT  Preventative health care Prescription for Tdap provided today. Foot exam completed today. Counseled to have eye exam. Patient declined colonoscopy/stool cards

## 2015-11-27 NOTE — Assessment & Plan Note (Signed)
Prescription for Tdap provided today. Foot exam completed today. Counseled to have eye exam. Patient declined colonoscopy/stool cards

## 2015-11-27 NOTE — Assessment & Plan Note (Signed)
Hospital follow up for CAP. Resolved.  -no further treatment -up to date on PNA vaccine

## 2015-11-27 NOTE — Patient Instructions (Addendum)
It was nice to see you today.   Please go and see your eye doctor. You are due for Tetanus shot, please go to the pharmacy to have this done. You are due for a colonoscopy. Please consider completing the stool cards where we check for blood.  No changes in your medications.   Return in 3 months for a diabetes check.

## 2015-11-27 NOTE — Assessment & Plan Note (Signed)
Diastolic CHF (Grade 1). No current symptoms other than trace edema. On ACE-I. No need for diuretics at this time. -monitor symptoms

## 2015-11-27 NOTE — Assessment & Plan Note (Signed)
Not quite at JNC-8 goals for DM. However since DM well controlled will hold on increasing Lisinopril as patient is a risk for falls.

## 2015-12-12 ENCOUNTER — Other Ambulatory Visit: Payer: Self-pay | Admitting: Adult Health

## 2015-12-16 NOTE — Telephone Encounter (Signed)
No longer under providers care.

## 2015-12-18 ENCOUNTER — Other Ambulatory Visit: Payer: Self-pay | Admitting: Adult Health

## 2015-12-23 ENCOUNTER — Other Ambulatory Visit: Payer: Self-pay | Admitting: Family Medicine

## 2015-12-23 DIAGNOSIS — I10 Essential (primary) hypertension: Secondary | ICD-10-CM

## 2016-01-14 ENCOUNTER — Telehealth: Payer: Self-pay | Admitting: Family Medicine

## 2016-01-14 NOTE — Telephone Encounter (Signed)
Note reviewed.

## 2016-01-14 NOTE — Telephone Encounter (Signed)
[facility] called because they are discharging the patient from their care. She is doing great and can handle everything by herself. Please call [facility] if you have any questions [phone]. jw

## 2016-01-19 ENCOUNTER — Other Ambulatory Visit: Payer: Self-pay | Admitting: Family Medicine

## 2016-01-30 ENCOUNTER — Other Ambulatory Visit: Payer: Self-pay | Admitting: Family Medicine

## 2016-01-30 ENCOUNTER — Ambulatory Visit (INDEPENDENT_AMBULATORY_CARE_PROVIDER_SITE_OTHER): Payer: Medicare HMO | Admitting: *Deleted

## 2016-01-30 DIAGNOSIS — Z23 Encounter for immunization: Secondary | ICD-10-CM | POA: Diagnosis not present

## 2016-01-30 MED ORDER — TETANUS-DIPHTH-ACELL PERTUSSIS 5-2.5-18.5 LF-MCG/0.5 IM SUSP: 0.5000 mL | Freq: Once | INTRAMUSCULAR | 0 refills | Status: AC

## 2016-01-30 NOTE — Progress Notes (Signed)
Patient here for vaccine administration. Needs Tdap rx so she can get it at her pharmacy. I am precepting & covering for PCP - rx printed.  Leeanne Rio, MD

## 2016-02-05 ENCOUNTER — Other Ambulatory Visit: Payer: Self-pay | Admitting: Family Medicine

## 2016-02-05 DIAGNOSIS — K219 Gastro-esophageal reflux disease without esophagitis: Secondary | ICD-10-CM

## 2016-02-05 NOTE — Telephone Encounter (Signed)
Needs refill on omeprazole.  [facility] on [address]

## 2016-02-06 MED ORDER — OMEPRAZOLE 20 MG PO CPDR: 20.0000 mg | DELAYED_RELEASE_CAPSULE | Freq: Every day | ORAL | 1 refills | Status: DC

## 2016-02-20 ENCOUNTER — Ambulatory Visit: Payer: Medicare HMO | Admitting: Family Medicine

## 2016-03-10 NOTE — Progress Notes (Signed)
Subjective:    Patient ID: Desiree Parker, female    DOB: Aug 30, 1945, 70 y.o.   MRN: [id number]  HPI 70 y/o female presents with daughter-in-law Mrs. Leonard Downing for routine follow up.  Headache Reports a 3 month history of headache and neck pain that began when she was in rehab during the month of August. States that she was given tylenol at the rehab center which gave her some relief from her headache and neck pain. Patient was not sure if she should continue taking Tylenol and has not been taking anything for her pain lately. Describes her headache pain is like having a band around her forehead.  Denies any nausea, vomiting, photophobia, dizziness, syncopal episodes or weakness in any extremity.   Upper Back Pain Intermittent, does not apply heat to area, no associated arm/hand numbness or weakness  HTN Prescribed Lisinopril 10 mg daily. Does not currently check blood pressures at home, no chest pain  Diabetes Controlled with Diet and exercise. Patient reports walking every day for 30 minutes. Due for eye exam. Recently had bilateral cataract surgery. HbA1c = 6.3% today. Does not check blood sugars at home.  Denies numbness or tingling in bilateral upper and lower extremities.   Poor Sleep Patterns Patient reports often falling asleep watching TV on cough, will move to bed when awakes in middle of night, also reports intermittent nighttime awakenings, house is reported as comfortable  HM Due for Colonoscopy (declined at last visit)  Social Former Smoker  Review of Systems  Constitutional: Negative for chills, fever and unexpected weight change.  HENT: Negative for congestion, ear discharge, ear pain, facial swelling, hearing loss, nosebleeds and trouble swallowing.   Eyes: Negative for photophobia, pain and visual disturbance.  Respiratory: Negative for cough, chest tightness, shortness of breath, wheezing and stridor.   Cardiovascular: Negative for chest pain and leg  swelling.  Gastrointestinal: Negative for abdominal pain, blood in stool, constipation, diarrhea, nausea and vomiting.  Endocrine: Negative for polyuria.  Genitourinary: Negative for difficulty urinating, dysuria, flank pain and hematuria.  Musculoskeletal: Positive for myalgias and neck pain.  Skin: Negative for rash.  Neurological: Positive for headaches. Negative for dizziness, tremors, syncope, facial asymmetry, speech difficulty, weakness, light-headedness and numbness.  Hematological: Does not bruise/bleed easily.  Psychiatric/Behavioral: Negative for confusion.         Objective:   Physical Exam BP (!) 154/78   Pulse 75   Temp 97.8 F (36.6 C) (Oral)   Ht 5\' 5"  (1.651 m)   Wt 195 lb (88.5 kg)   BMI 32.45 kg/m   GEN: Pleasant female. Daughter-in-law at chairside. NAD.   HEENT: Normocephalic. Atraumatic. PERRLA. EOMI. TM clear with no effusion, erythema, bulging or retraction. Nasal dorsum midline. No rhinorrhea or epistaxis. Oropharynx clear with no erythema or edema. Complete upper and lower edentulism. No dentures in place. Trachea midline. No lymphadenopathy.  CV: Regular rate and rhythm. S1 and S2 present. No murmurs or rubs.  Pulm: Lungs CTAB. No rhonchi, rales, wheezing or stridor.  Abd: Soft and non-tender.  Ext: Warm and well perfused. +DP and PT pulses. Intact sensation to monofilament test.  NEURO: Alert and oriented x 3. CN II-XII intact bilaterally.Strength 5/5 in bilateral upper and lower extremities. Sensation to light touch intact in all extremities. No pronator drift. Able to perform bilateral finger to nose testing. MSK: bilateral upper thoracic/trapezius tenderness, ROM is limited due to pain  POC A1C 6.3    Assessment & Plan:  Diabetes mellitus, type II  Controlled. A1C 6.3%. Up to date on eye exams.  -continue diet control  HYPERTENSION, BENIGN SYSTEMIC Mildly elevated per JNC 8 guidelines. -patient to check pressures at home and call into  office -continue Lisinopril 10 mg daily  Headache Episodic tension type headache since July. Neuro Exam unremarkable. May be related to MSK pain of upper back/neck. -continue PRN Tylenol -see treatment for upper back pain -if persists would consider imaging of brain to rule out mass  Upper back pain Upper back tenderness. No associated neurologic symptoms.  -trial of heat to area -continue home PT exercise regimen  Insomnia Poor sleep likely related to poor sleep hygiene.  -discussed sleep hygiene

## 2016-03-11 ENCOUNTER — Ambulatory Visit (INDEPENDENT_AMBULATORY_CARE_PROVIDER_SITE_OTHER): Payer: Medicare HMO | Admitting: Family Medicine

## 2016-03-11 ENCOUNTER — Encounter: Payer: Self-pay | Admitting: Family Medicine

## 2016-03-11 VITALS — BP 154/78 | HR 75 | Temp 97.8°F | Ht 65.0 in | Wt 195.0 lb

## 2016-03-11 DIAGNOSIS — G44219 Episodic tension-type headache, not intractable: Secondary | ICD-10-CM

## 2016-03-11 DIAGNOSIS — R519 Headache, unspecified: Secondary | ICD-10-CM | POA: Insufficient documentation

## 2016-03-11 DIAGNOSIS — H269 Unspecified cataract: Secondary | ICD-10-CM | POA: Diagnosis not present

## 2016-03-11 DIAGNOSIS — E119 Type 2 diabetes mellitus without complications: Secondary | ICD-10-CM | POA: Diagnosis not present

## 2016-03-11 DIAGNOSIS — M549 Dorsalgia, unspecified: Secondary | ICD-10-CM

## 2016-03-11 DIAGNOSIS — G4700 Insomnia, unspecified: Secondary | ICD-10-CM | POA: Insufficient documentation

## 2016-03-11 DIAGNOSIS — R918 Other nonspecific abnormal finding of lung field: Secondary | ICD-10-CM | POA: Diagnosis not present

## 2016-03-11 DIAGNOSIS — R51 Headache: Secondary | ICD-10-CM

## 2016-03-11 DIAGNOSIS — I10 Essential (primary) hypertension: Secondary | ICD-10-CM

## 2016-03-11 HISTORY — DX: Headache, unspecified: R51.9

## 2016-03-11 LAB — POCT GLYCOSYLATED HEMOGLOBIN (HGB A1C): Hemoglobin A1C: 6.3

## 2016-03-11 NOTE — Patient Instructions (Addendum)
It was nice to see you today.  Sleep - set a time to go to bed each night, sleep only in your bed, make sure your room is comfortable  Headache - may take Tylenol as needed 2-3 times per week, attempt trial of heating pack to your upper back as this may help to improve the headaches, if the headaches persist please come back and see me (1-2 months).   Blood Pressure - slightly elevated today, please check at home and call them in to my office in one month (should be less than 150/90)  Diabetes is well controlled. No need for medications at this time.

## 2016-03-11 NOTE — Assessment & Plan Note (Signed)
Poor sleep likely related to poor sleep hygiene.  -discussed sleep hygiene

## 2016-03-11 NOTE — Assessment & Plan Note (Signed)
Episodic tension type headache since July. Neuro Exam unremarkable. May be related to MSK pain of upper back/neck. -continue PRN Tylenol -see treatment for upper back pain -if persists would consider imaging of brain to rule out mass

## 2016-03-11 NOTE — Assessment & Plan Note (Signed)
Controlled. A1C 6.3%. Up to date on eye exams.  -continue diet control

## 2016-03-11 NOTE — Assessment & Plan Note (Signed)
Mildly elevated per JNC 8 guidelines. -patient to check pressures at home and call into office -continue Lisinopril 10 mg daily

## 2016-03-11 NOTE — Assessment & Plan Note (Addendum)
Upper back tenderness. No associated neurologic symptoms.  -trial of heat to area -continue home PT exercise regimen

## 2016-03-12 ENCOUNTER — Ambulatory Visit: Payer: Medicare HMO | Admitting: Family Medicine

## 2016-04-14 ENCOUNTER — Other Ambulatory Visit: Payer: Self-pay | Admitting: Family Medicine

## 2016-04-14 DIAGNOSIS — Z9114 Patient's other noncompliance with medication regimen: Secondary | ICD-10-CM

## 2016-06-21 ENCOUNTER — Other Ambulatory Visit: Payer: Self-pay | Admitting: Family Medicine

## 2016-06-21 DIAGNOSIS — I10 Essential (primary) hypertension: Secondary | ICD-10-CM

## 2016-06-22 ENCOUNTER — Other Ambulatory Visit: Payer: Self-pay | Admitting: Family Medicine

## 2016-06-22 MED ORDER — LISINOPRIL 10 MG PO TABS: 10.0000 mg | ORAL_TABLET | Freq: Every day | ORAL | 1 refills | Status: DC

## 2016-06-22 NOTE — Progress Notes (Signed)
90

## 2016-07-15 ENCOUNTER — Other Ambulatory Visit: Payer: Self-pay | Admitting: Family Medicine

## 2016-07-15 DIAGNOSIS — K219 Gastro-esophageal reflux disease without esophagitis: Secondary | ICD-10-CM

## 2016-10-11 ENCOUNTER — Other Ambulatory Visit: Payer: Self-pay | Admitting: Family Medicine

## 2016-10-11 NOTE — Telephone Encounter (Signed)
Pt is calling to request refill of:  Name of Medication(s):  Lisinopril, Lipitor, and Omeprazole. Last date of OV:  03/11/16 [facility]: [facility]   Will route refill request to Clinic RN.  Discussed with patient policy to call pharmacy for future refills.  Also, discussed refills may take up to 48 hours to approve or deny.  Areatha Keas

## 2016-10-21 ENCOUNTER — Other Ambulatory Visit: Payer: Self-pay | Admitting: *Deleted

## 2016-10-21 ENCOUNTER — Other Ambulatory Visit: Payer: Self-pay | Admitting: Adult Health

## 2016-10-21 DIAGNOSIS — J449 Chronic obstructive pulmonary disease, unspecified: Secondary | ICD-10-CM

## 2016-10-21 MED ORDER — ALBUTEROL SULFATE HFA 108 (90 BASE) MCG/ACT IN AERS: 2.0000 | INHALATION_SPRAY | Freq: Four times a day (QID) | RESPIRATORY_TRACT | 1 refills | Status: DC | PRN

## 2016-10-22 ENCOUNTER — Other Ambulatory Visit: Payer: Self-pay | Admitting: *Deleted

## 2016-10-22 DIAGNOSIS — Z9114 Patient's other noncompliance with medication regimen: Secondary | ICD-10-CM

## 2016-10-22 NOTE — Telephone Encounter (Signed)
Please clarify lipitor dose for patient. There are two doses listed. If unable to clarify,please help her schedule follow-up with PCP soon for medication clarification.

## 2016-11-04 MED ORDER — LISINOPRIL 10 MG PO TABS: 10.0000 mg | ORAL_TABLET | Freq: Every day | ORAL | 2 refills | Status: DC

## 2016-11-04 NOTE — Telephone Encounter (Signed)
Pt did not receive the refill on lisinopril. ep

## 2016-11-16 ENCOUNTER — Ambulatory Visit: Payer: Medicare HMO | Admitting: Family Medicine

## 2016-11-18 ENCOUNTER — Encounter: Payer: Self-pay | Admitting: Family Medicine

## 2016-11-18 ENCOUNTER — Ambulatory Visit (INDEPENDENT_AMBULATORY_CARE_PROVIDER_SITE_OTHER): Payer: Self-pay | Admitting: Family Medicine

## 2016-11-18 VITALS — BP 122/68 | HR 74 | Temp 97.6°F | Wt 186.0 lb

## 2016-11-18 DIAGNOSIS — K219 Gastro-esophageal reflux disease without esophagitis: Secondary | ICD-10-CM

## 2016-11-18 DIAGNOSIS — Z9114 Patient's other noncompliance with medication regimen: Secondary | ICD-10-CM

## 2016-11-18 DIAGNOSIS — E119 Type 2 diabetes mellitus without complications: Secondary | ICD-10-CM

## 2016-11-18 DIAGNOSIS — M810 Age-related osteoporosis without current pathological fracture: Secondary | ICD-10-CM

## 2016-11-18 LAB — POCT GLYCOSYLATED HEMOGLOBIN (HGB A1C): Hemoglobin A1C: 6.1

## 2016-11-18 MED ORDER — OMEPRAZOLE 20 MG PO CPDR: DELAYED_RELEASE_CAPSULE | ORAL | 0 refills | Status: DC

## 2016-11-18 MED ORDER — ATORVASTATIN CALCIUM 20 MG PO TABS: 20.0000 mg | ORAL_TABLET | Freq: Every day | ORAL | 1 refills | Status: DC

## 2016-11-18 MED ORDER — ALENDRONATE SODIUM 70 MG PO TABS: 70.0000 mg | ORAL_TABLET | ORAL | 11 refills | Status: DC

## 2016-11-18 NOTE — Patient Instructions (Signed)
It was great seeing you today! We have addressed the following issues today  1. Your A1c was 6.1 improved from 6.3. Keep up the good work. Continue with your current diet and exercise plan. 2. Blood pressure is also within normal range for your age. Keep taking your medications as prescribed 3. I refilled your medications (prilosec, fosomax, lipitor)  as discussed let me know if you need me to refill your lisinopril.  If we did any lab work today, and the results require attention, either me or my nurse will get in touch with you. If everything is normal, you will get a letter in mail and a message via . If you don't hear from [place] in two weeks, please give [place] a call. Otherwise, we look forward to seeing you again at your next visit. If you have any questions or concerns before then, please call the clinic at [phone].  Please bring all your medications to every doctors visit  Sign up for My Chart to have easy access to your labs results, and communication with your Primary care physician. Please ask Front Desk for some assistance.   Please check-out at the front desk before leaving the clinic.    Take Care,   Dr. Andy Gauss

## 2016-11-19 NOTE — Progress Notes (Signed)
   Subjective:    Patient ID: Desiree Parker, female    DOB: 1945-09-24, 71 y.o.   MRN: [id number]   CC: T2DM follow up  HPI: Patient is a 71 yo female who present today to clinic for T2DM follow up and A1c check. Patient is accompanied by her son who has been playing an integral role in patient T2DM and other comorbidities management. Patient continue to follow a DM diet with her son purchasing all her groceries making sure patient is follow an strict diet. Patient is not on any glycemic control agent. Last A1c was 6.3 and she continue to endorse similar lifestyle. Denies any polyuria, polydipsia, neuropathic pain.  Smoking status reviewed   ROS: all other systems were reviewed and are negative other than in the HPI   Past Medical History:  Diagnosis Date  . Alternating exotropia   . Arterial insufficiency (HCC) 02/04/2004   ABI 0.84  . CAP (community acquired pneumonia) 10/2015  . Chest pain 09/11/2015  . COPD (chronic obstructive pulmonary disease) ([place])   . Degenerative disk disease 10/2005   Lumbar, anterolithesis, L4-5,5-S1  . Diabetes mellitus without complication ([place])   . Hypertension   . 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 Shortness of breath dyspnea   . TOBACCO USER 03/04/2009   Snuff dipper      Past Surgical History:  Procedure Laterality Date  . CESAREAN SECTION  1975  . CHOLECYSTECTOMY  1975   states gallstones removed  . TUBAL LIGATION  1975    Past medical history, surgical, family, and social history reviewed and updated in the EMR as appropriate.  Objective:  BP 122/68   Pulse 74   Temp 97.6 F (36.4 C) (Oral)   Wt 186 lb (84.4 kg)   SpO2 95%   BMI 30.95 kg/m   Vitals and nursing note reviewed  General: NAD, pleasant, able to participate in exam Cardiac: RRR, normal heart sounds, no murmurs. 2+ radial and PT pulses bilaterally Respiratory: CTAB, normal effort, No wheezes, rales or rhonchi Abdomen: soft, nontender, nondistended, no hepatic or  splenomegaly, +BS Extremities: no edema or cyanosis. WWP. Skin: warm and dry, no rashes noted Neuro: alert and oriented x4, no focal deficits Psych: Normal affect and mood   Assessment & Plan:   #T2DM follow up Patient's A1c is 6.1 slightly down from 6.3 in November 2017 consistent current  diet and exercise reported by patient. Patient is currently not using any medications and DM2 is completely diet controlled. Encourage patient and other family members involve in her care to continue with current regimen.  #HTN  At goal 122/68, continue current regimen --Lisinopril 10 mg daily  Marjie Skiff, MD [place] PGY-2

## 2016-12-10 ENCOUNTER — Other Ambulatory Visit: Payer: Self-pay | Admitting: Adult Health

## 2016-12-10 DIAGNOSIS — K219 Gastro-esophageal reflux disease without esophagitis: Secondary | ICD-10-CM

## 2017-02-13 ENCOUNTER — Other Ambulatory Visit: Payer: Self-pay | Admitting: Family Medicine

## 2017-02-13 DIAGNOSIS — K219 Gastro-esophageal reflux disease without esophagitis: Secondary | ICD-10-CM

## 2017-05-09 ENCOUNTER — Encounter: Payer: Self-pay | Admitting: Family Medicine

## 2017-05-10 ENCOUNTER — Other Ambulatory Visit: Payer: Self-pay

## 2017-05-10 ENCOUNTER — Ambulatory Visit (INDEPENDENT_AMBULATORY_CARE_PROVIDER_SITE_OTHER): Payer: Medicare Other | Admitting: Family Medicine

## 2017-05-10 ENCOUNTER — Encounter: Payer: Self-pay | Admitting: Family Medicine

## 2017-05-10 VITALS — BP 142/68 | HR 94 | Temp 98.7°F | Wt 193.0 lb

## 2017-05-10 DIAGNOSIS — J449 Chronic obstructive pulmonary disease, unspecified: Secondary | ICD-10-CM

## 2017-05-10 DIAGNOSIS — M25562 Pain in left knee: Secondary | ICD-10-CM

## 2017-05-10 DIAGNOSIS — I10 Essential (primary) hypertension: Secondary | ICD-10-CM

## 2017-05-10 DIAGNOSIS — Z1239 Encounter for other screening for malignant neoplasm of breast: Secondary | ICD-10-CM

## 2017-05-10 DIAGNOSIS — E119 Type 2 diabetes mellitus without complications: Secondary | ICD-10-CM

## 2017-05-10 DIAGNOSIS — E785 Hyperlipidemia, unspecified: Secondary | ICD-10-CM

## 2017-05-10 DIAGNOSIS — M25561 Pain in right knee: Secondary | ICD-10-CM | POA: Diagnosis not present

## 2017-05-10 DIAGNOSIS — G8929 Other chronic pain: Secondary | ICD-10-CM | POA: Insufficient documentation

## 2017-05-10 DIAGNOSIS — Z1231 Encounter for screening mammogram for malignant neoplasm of breast: Secondary | ICD-10-CM

## 2017-05-10 DIAGNOSIS — Z1211 Encounter for screening for malignant neoplasm of colon: Secondary | ICD-10-CM | POA: Diagnosis not present

## 2017-05-10 DIAGNOSIS — I5032 Chronic diastolic (congestive) heart failure: Secondary | ICD-10-CM

## 2017-05-10 DIAGNOSIS — M25569 Pain in unspecified knee: Secondary | ICD-10-CM | POA: Insufficient documentation

## 2017-05-10 LAB — POCT GLYCOSYLATED HEMOGLOBIN (HGB A1C): HEMOGLOBIN A1C: 6

## 2017-05-10 NOTE — Patient Instructions (Signed)
It was nice meeting you today. I am sorry about your knee pain.  Let us get an xray of your knees as well as physical therapy. Then, I will complete your request for a recliner.  Your DM seems to be well controlled on diet. A1C of 6.0 today. Keep it up.  Please call breast center for mammogram and you will receive a call to schedule appointment for colonoscopy.  I discussed PAP test since you are not adequately screened. However, you declined today. Given your age that is well.  We will get lab done today and I will call you as soon as I get the results.

## 2017-05-10 NOTE — Assessment & Plan Note (Signed)
??   Arthritis. Xray ordered to assess. PT referral placed. Continue Tylenol as needed for pain. I called Darlina Guys with [employer] regarding her recliner request. Lenna Sciara stated this is a retail item. She recommended hand written script for patient to bring to one of their retail stores. I called and discussed this with the patient. She requested that I mail the script to her. Script mailed.

## 2017-05-10 NOTE — Assessment & Plan Note (Signed)
Systolic BP slightly elevated. Continue Lisinopril 10 mg qd. Cmet checked.

## 2017-05-10 NOTE — Assessment & Plan Note (Signed)
Diet control. A1C of 6 today. Continue current diet regimen.

## 2017-05-10 NOTE — Progress Notes (Signed)
Subjective:     Patient ID: Desiree Parker, female   DOB: [birth year], 72 y.o.   MRN: [id number]  HPI Knee pain: C/O B/L Knee pain on going for more than 1 yr. She stated she was in rehab for a while and got PT done then but since she has had none. Her knee pain is about 8/10 severity, she used tylenol prn pain with minimal improvement. Pain is severe enough to prevent her from getting up from her sofa at home. She will like to request for a recliner prescription which she felt might help with her symptoms. No recent fall. DM2/HTN/CHF/COPD:Not on meds for DM, here for follow-up. She does not check CBG at home since she is not on meds. She is compliant with BP meds. Denies SOB, no leg swelling. AO:ZHYQ for routine health care.  Current Outpatient Medications on File Prior to Visit  Medication Sig Dispense Refill  . alendronate (FOSAMAX) 70 MG tablet Take 1 tablet (70 mg total) by mouth every 7 (seven) days. Take with a full glass of water on an empty stomach. 4 tablet 11  . aspirin 81 MG EC tablet Take 81 mg by mouth daily.     [name] atorvastatin (LIPITOR) 20 MG tablet Take 1 tablet (20 mg total) by mouth daily at 6 PM. 90 tablet 1  . lisinopril (PRINIVIL,ZESTRIL) 10 MG tablet Take 1 tablet (10 mg total) by mouth daily. 90 tablet 2  . omeprazole (PRILOSEC) 20 MG capsule TAKE 1 CAPSULE(20 MG) BY MOUTH DAILY 90 capsule 0  . acetaminophen (TYLENOL) 325 MG tablet Take 650 mg by mouth every 4 (four) hours as needed for mild pain.    [name] albuterol (PROVENTIL HFA;VENTOLIN HFA) 108 (90 Base) MCG/ACT inhaler Inhale 2 puffs into the lungs every 6 (six) hours as needed for wheezing. (Patient not taking: Reported on 05/10/2017) 2 Inhaler 1  . calcium-vitamin D (SM CALCIUM 500/VITAMIN D3) 500-400 MG-UNIT tablet Take by mouth.    . Difluprednate 0.05 % EMUL One drop in operative eye BID starting after surgery for 3 weeks    . ipratropium-albuterol (DUONEB) 0.5-2.5 (3) MG/3ML SOLN Take 3 mLs by nebulization every 6 (six)  hours as needed (Cough/SOB).    [name] ofloxacin (OCUFLOX) 0.3 % ophthalmic solution Instill 1 drop BID in operative eye starting 2 days prior to surgery and continue 3 weeks after surgery.     No current facility-administered medications on file prior to visit.    Past Medical History:  Diagnosis Date  . Alternating exotropia   . Arterial insufficiency (HCC) 02/04/2004   ABI 0.84  . CAP (community acquired pneumonia) 10/2015  . Chest pain 09/11/2015  . COPD (chronic obstructive pulmonary disease) ([place])   . Degenerative disk disease 10/2005   Lumbar, anterolithesis, L4-5,5-S1  . Diabetes mellitus without complication ([place])   . Estrogen deficiency 09/22/2015  . Hypertension   . 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 Noncompliance with medications 09/11/2015  . Shortness of breath dyspnea   . TOBACCO USER 03/04/2009   Snuff dipper     Vitals:   05/10/17 1011  BP: (!) 142/68  Pulse: 94  Temp: 98.7 F (37.1 C)  TempSrc: Oral  SpO2: 96%  Weight: 193 lb (87.5 kg)     Review of Systems  Respiratory: Negative.   Cardiovascular: Negative.   Gastrointestinal: Negative.   Musculoskeletal: Positive for arthralgias.       B/L knee pain  Neurological: Negative.   All other systems reviewed and are negative.  Objective:   Physical Exam  Constitutional: She is oriented to person, place, and time. She appears well-developed. No distress.  Cardiovascular: Normal rate, regular rhythm and normal heart sounds.  No murmur heard. Pulmonary/Chest: Effort normal and breath sounds normal. No respiratory distress. She has no wheezes.  Abdominal: Soft. Bowel sounds are normal. She exhibits no distension and no mass. There is no tenderness.  Musculoskeletal: She exhibits no edema.       Right knee: She exhibits decreased range of motion. She exhibits no swelling, no deformity and no LCL laxity. Tenderness found.       Left knee: She exhibits decreased range of motion. She exhibits no swelling, no  deformity and no erythema. Tenderness found.  Slow gait  Neurological: She is alert and oriented to person, place, and time. No cranial nerve deficit.  Nursing note and vitals reviewed.      Assessment:     Knee pain:  DM2 HTN CHF: COPD HM:    Plan:     Check problem list.     For Health maintenance: I discussed inadequate PAP screening and recommended one time PAP test. She declined at this time.  Mammogram slip given and it was also ordered.  Referral to GI placed for colonoscopy. She agreed with plan.

## 2017-05-10 NOTE — Assessment & Plan Note (Signed)
No acute findings.

## 2017-05-10 NOTE — Assessment & Plan Note (Signed)
Stable on current regimen   

## 2017-05-11 ENCOUNTER — Telehealth: Payer: Self-pay | Admitting: Family Medicine

## 2017-05-11 ENCOUNTER — Other Ambulatory Visit: Payer: Self-pay | Admitting: Family Medicine

## 2017-05-11 DIAGNOSIS — R748 Abnormal levels of other serum enzymes: Secondary | ICD-10-CM | POA: Insufficient documentation

## 2017-05-11 DIAGNOSIS — Z9114 Patient's other noncompliance with medication regimen: Secondary | ICD-10-CM

## 2017-05-11 LAB — CMP14+EGFR
ALBUMIN: 4.3 g/dL (ref 3.5–4.8)
ALT: 41 IU/L — ABNORMAL HIGH (ref 0–32)
AST: 48 IU/L — ABNORMAL HIGH (ref 0–40)
Albumin/Globulin Ratio: 1.5 (ref 1.2–2.2)
Alkaline Phosphatase: 73 IU/L (ref 39–117)
BUN / CREAT RATIO: 20 (ref 12–28)
BUN: 15 mg/dL (ref 8–27)
Bilirubin Total: 0.8 mg/dL (ref 0.0–1.2)
CHLORIDE: 104 mmol/L (ref 96–106)
CO2: 25 mmol/L (ref 20–29)
Calcium: 9.3 mg/dL (ref 8.7–10.3)
Creatinine, Ser: 0.76 mg/dL (ref 0.57–1.00)
GFR calc non Af Amer: 80 mL/min/{1.73_m2} (ref >59)
GFR, EST AFRICAN AMERICAN: 92 mL/min/{1.73_m2} (ref >59)
GLOBULIN, TOTAL: 2.9 g/dL (ref 1.5–4.5)
GLUCOSE: 84 mg/dL (ref 65–99)
Potassium: 3.8 mmol/L (ref 3.5–5.2)
SODIUM: 144 mmol/L (ref 134–144)
TOTAL PROTEIN: 7.2 g/dL (ref 6.0–8.5)

## 2017-05-11 LAB — LIPID PANEL
CHOLESTEROL TOTAL: 109 mg/dL (ref 100–199)
Chol/HDL Ratio: 2.2 ratio (ref 0.0–4.4)
HDL: 50 mg/dL (ref >39)
LDL Calculated: 48 mg/dL (ref 0–99)
TRIGLYCERIDES: 57 mg/dL (ref 0–149)
VLDL Cholesterol Cal: 11 mg/dL (ref 5–40)

## 2017-05-11 NOTE — Telephone Encounter (Signed)
Lab result discussed with her. Only positive finding is mildly elevated AST and ALT. ?? Related to Tylenol vs Statin use. May continue Statin for now. Use Tylenol sparingly. Recheck in 3-6 months. Return sooner if symptomatic. Patient agreed with plan.

## 2017-05-14 ENCOUNTER — Other Ambulatory Visit: Payer: Self-pay | Admitting: Family Medicine

## 2017-05-14 DIAGNOSIS — K219 Gastro-esophageal reflux disease without esophagitis: Secondary | ICD-10-CM

## 2017-05-20 ENCOUNTER — Ambulatory Visit
Admission: RE | Admit: 2017-05-20 | Discharge: 2017-05-20 | Disposition: A | Discharge status: Home / Self Care | Payer: Medicare Other | Source: Ambulatory Visit | Attending: Family Medicine | Admitting: Family Medicine

## 2017-05-20 DIAGNOSIS — Z1231 Encounter for screening mammogram for malignant neoplasm of breast: Secondary | ICD-10-CM | POA: Diagnosis not present

## 2017-05-20 DIAGNOSIS — Z1239 Encounter for other screening for malignant neoplasm of breast: Secondary | ICD-10-CM

## 2017-05-29 NOTE — Progress Notes (Signed)
[facility] Phone: [phone]   Date of Visit: 05/30/2017   HPI:  Urinary urgency, incontinence: -Patient's son is here today who assisted with providing history. -Son reports that about a year ago he noticed that she was using depends.  When asked she reported that she wears a discharge in case she is unable to go to the bathroom.  Son has noticed that she uses depends more frequently. -Patient reports of symptoms in the past 5-6 months.  Patient is not the best at describing her explaining her symptoms to me.  She reports of urgency without any dysuria or hematuria.  She also reports of urinary frequency.  Reports she does have some incontinence.  Reports that sometimes this happens when sitting.  When I asked her specifically if it occurs when she is laughing or sneezing or coughing she could not really answer this question.  She reports that sometimes she is unable to get to the bathroom before she urinates. -She has not had any vaginal deliveries. -She reports that sometimes she does not drink a lot of water so that she does not have to go to the bathroom as much.  She reports that she feels like her bladder does completely empty when she does use the bathroom.  Denies any issues with constipation. -In the past per chart review around 2017 her prior PCP discussed trying Keagle exercises.  It seems like she has not tried this.  She also has not tried to scheduled voids. -She declines a pelvic exam today.  Of note her son was not pleased that she declined a pelvic exam today but I explained to him that I cannot do an exam that the patient declines.  ROS: See HPI.  [place]:  PMH: HTN HLD Chronic Diastolic HF COPD GER DM2 Osteoporosis Stress Incontinence Obeisty  PHYSICAL EXAM: BP (!) 160/90   Pulse 73   Temp 98.1 F (36.7 C) (Oral)   Wt 193 lb (87.5 kg)   SpO2 97%   BMI 32.12 kg/m  GEN: NAD HEENT: Atraumatic, normocephalic, neck supple, EOMI,  sclera clear  CV: RRR, no murmurs, rubs, or gallops PULM: CTAB, normal effort ABD: Soft, nontender, nondistended, NABS, no organomegaly SKIN: No rash or cyanosis; warm and well-perfused EXTR: No lower extremity edema or calf tenderness PSYCH: Mood and affect euthymic, normal rate and volume of speech NEURO: Awake, alert, no focal deficits grossly, normal speech  ASSESSMENT/PLAN:  1. Urinary incontinence, unspecified type Seems mixed with urge incontinence and overflow incontinence.  Possibly also has stress incontinence but difficult to tell with her history that she provides.  Patient declined a pelvic exam today.  Her urinalysis showed trace ketones, 30 protein, trace leukocytes with microscopy with 5-10 epithelial cells, 2+ rods, 1-5 white blood cells.  This is not consistent with a urinary tract infection.  More consistent with contamination from flora outside of the vagina.  We discussed different therapies to help with her symptoms which included Kegel exercises at home, pelvic physical therapy, oral medications.  After much discussion I think a referral to physical therapy for pelvic floor exercises will be more helpful than Keagle exercises at home.  We also discussed scheduled voids to help avoid incontinence episodes.  We stressed the importance of keeping herself hydrated.  Follow-up in 4-6 weeks to see how things are going with this.  Consider Myrbetriq if symptoms persist and also would ideally  want to do a pelvic exam at that time. - POCT urinalysis dipstick -  POCT UA - Microscopic Only - Ambulatory referral to Physical Therapy   Smiley Houseman, MD PGY [place]

## 2017-05-30 ENCOUNTER — Encounter: Payer: Self-pay | Admitting: Internal Medicine

## 2017-05-30 ENCOUNTER — Other Ambulatory Visit: Payer: Self-pay

## 2017-05-30 ENCOUNTER — Ambulatory Visit (INDEPENDENT_AMBULATORY_CARE_PROVIDER_SITE_OTHER): Payer: Medicare Other | Admitting: Internal Medicine

## 2017-05-30 VITALS — BP 160/90 | HR 73 | Temp 98.1°F | Wt 193.0 lb

## 2017-05-30 DIAGNOSIS — R32 Unspecified urinary incontinence: Secondary | ICD-10-CM | POA: Diagnosis not present

## 2017-05-30 LAB — POCT URINALYSIS DIP (MANUAL ENTRY)
BILIRUBIN UA: NEGATIVE
Blood, UA: NEGATIVE
Glucose, UA: NEGATIVE mg/dL
Nitrite, UA: NEGATIVE
SPEC GRAV UA: 1.025 (ref 1.010–1.025)
Urobilinogen, UA: 0.2 E.U./dL
pH, UA: 5.5 (ref 5.0–8.0)

## 2017-05-30 LAB — POCT UA - MICROSCOPIC ONLY

## 2017-05-30 NOTE — Patient Instructions (Addendum)
We made a referral to the physical therapist for pelvic rehab for your incontinence Please try to schedule going to the bathroom before you have the urgency to go. This is a way to help prevent accidents Please make sure you drink enough water to stay hydrated.  Follow up in 4 - 6 weeks to see how things are going.

## 2017-05-31 ENCOUNTER — Encounter: Payer: Self-pay | Admitting: Internal Medicine

## 2017-06-13 ENCOUNTER — Ambulatory Visit: Payer: Medicare Other | Admitting: Physical Therapy

## 2017-06-15 ENCOUNTER — Ambulatory Visit: Payer: Medicare Other | Attending: Family Medicine | Admitting: Physical Therapy

## 2017-06-15 ENCOUNTER — Other Ambulatory Visit: Payer: Self-pay

## 2017-06-15 ENCOUNTER — Encounter: Payer: Self-pay | Admitting: Physical Therapy

## 2017-06-15 ENCOUNTER — Telehealth: Payer: Self-pay | Admitting: Physical Therapy

## 2017-06-15 DIAGNOSIS — R278 Other lack of coordination: Secondary | ICD-10-CM

## 2017-06-15 DIAGNOSIS — M6281 Muscle weakness (generalized): Secondary | ICD-10-CM | POA: Diagnosis not present
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
## 2017-06-15 NOTE — Patient Instructions (Addendum)
Adduction: Hip - Knees Together With Pelvic Floor (Sitting)    Sit with towel roll/pillow  between knees. Squeeze pelvic floor while pushing knees together. Hold for _5__ seconds. Rest for _5__ seconds. Repeat _10__ times. Do __2_ times a day.  Copyright  VHI. All rights reserved.  External Rotation: Hip - Knees Apart With Pelvic Floor (Sitting)    Sit, band tied just above knees. Squeeze pelvic floor while pulling knees apart. Hold for _5__ seconds. Rest for __5_ seconds. Repeat __10_ times. Do _2__ times a day.  Copyright  VHI. All rights reserved.  [facility] [address] [place], [place] [address] Phone # [phone] Fax [phone]
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
## 2017-06-21 ENCOUNTER — Encounter: Payer: Self-pay | Admitting: Psychology

## 2017-06-21 ENCOUNTER — Other Ambulatory Visit (HOSPITAL_COMMUNITY)
Admission: RE | Admit: 2017-06-21 | Discharge: 2017-06-21 | Disposition: A | Discharge status: Home / Self Care | Payer: Medicare Other | Source: Ambulatory Visit | Attending: Family Medicine | Admitting: Family Medicine

## 2017-06-21 ENCOUNTER — Encounter: Payer: Self-pay | Admitting: Internal Medicine

## 2017-06-21 ENCOUNTER — Other Ambulatory Visit: Payer: Self-pay

## 2017-06-21 ENCOUNTER — Ambulatory Visit (INDEPENDENT_AMBULATORY_CARE_PROVIDER_SITE_OTHER): Payer: Medicare Other | Admitting: Internal Medicine

## 2017-06-21 VITALS — BP 122/64 | HR 64 | Temp 98.1°F | Ht 65.0 in | Wt 193.0 lb

## 2017-06-21 DIAGNOSIS — T7421XA Adult sexual abuse, confirmed, initial encounter: Secondary | ICD-10-CM | POA: Diagnosis not present

## 2017-06-21 DIAGNOSIS — Z114 Encounter for screening for human immunodeficiency virus [HIV]: Secondary | ICD-10-CM | POA: Diagnosis not present

## 2017-06-21 DIAGNOSIS — T7421XS Adult sexual abuse, confirmed, sequela: Secondary | ICD-10-CM

## 2017-06-21 DIAGNOSIS — N898 Other specified noninflammatory disorders of vagina: Secondary | ICD-10-CM

## 2017-06-21 LAB — POCT WET PREP (WET MOUNT)
Clue Cells Wet Prep Whiff POC: NEGATIVE
Trichomonas Wet Prep HPF POC: ABSENT
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
## 2017-06-21 NOTE — Patient Instructions (Signed)
I talked to [facility].  They recommend you walk-in to be seen during the following days/times:  8:30-12 and 1:00-2:30 Monday through Friday.  The address is [address]  Talking to someone about this will not be easy.  They are expert in helping women get to a better place.  Please call me at [phone] if you have any questions or need to talk it through again.  I will plan to call you in one week to make sure you got connected.  Dr. [facility]

## 2017-06-21 NOTE — Patient Instructions (Addendum)
Thank you for coming in.  Please follow up with me in about 2 weeks.  We will get some blood work today

## 2017-06-22 LAB — URINE CYTOLOGY ANCILLARY ONLY
Chlamydia: NEGATIVE
Neisseria Gonorrhea: NEGATIVE
Trichomonas: NEGATIVE

## 2017-06-22 LAB — RPR: RPR Ser Ql: NONREACTIVE

## 2017-06-22 LAB — HIV ANTIBODY (ROUTINE TESTING W REFLEX): HIV SCREEN 4TH GENERATION: NONREACTIVE

## 2017-06-23 ENCOUNTER — Encounter: Payer: Self-pay | Admitting: Physical Therapy

## 2017-06-23 ENCOUNTER — Telehealth: Payer: Self-pay | Admitting: Internal Medicine

## 2017-06-23 ENCOUNTER — Ambulatory Visit: Payer: Medicare Other | Admitting: Physical Therapy

## 2017-06-23 DIAGNOSIS — R278 Other lack of coordination: Secondary | ICD-10-CM

## 2017-06-23 DIAGNOSIS — M6281 Muscle weakness (generalized): Secondary | ICD-10-CM

## 2017-06-23 NOTE — Telephone Encounter (Signed)
Attempted to call patient to report of negative STI results. Went to [employer]. Left message to call back. Please inform of results if she calls back.

## 2017-06-23 NOTE — Patient Instructions (Addendum)
ABDUCTION: Standing (Active)    Stand, feet flat. Lift right leg out to side.  Complete __2_ sets of __15_ repetitions. Perform __1_ sessions per day.  http://gtsc.exer.us/111   Copyright  VHI. All rights reserved.  EXTENSION: Standing (Active)    Stand, both feet flat. Draw right leg behind body as far as possible.  Complete __2_ sets of __15_ repetitions. Perform __1_ sessions per day. Do not swing leg or rock your trunk.  http://gtsc.exer.us/77   Copyright  VHI. All rights reserved.  Mini-Squats (Standing)    Stand with support. Bend knees slightly. Tighten pelvic floor. Hold for _5__ seconds. Return to straight standing. Relax for _5__ seconds. Repeat _10__ times. Do __1_ times a day.  Copyright  VHI. All rights reserved.  Bracing With Bridging (Hook-Lying)    With neutral spine, tighten pelvic floor and abdominals and hold. Lift bottom. Repeat _15__ times. Do __1_ times a day.   Copyright  VHI. All rights reserved.    Bracing With Arm Lift (Hook-Lying)    With neutral spine, tighten pelvic floor and abdominals and hold. Alternating arms, raise over head and return to side. Repeat _10__ times. Do 1___ times a day.   Copyright  VHI. All rights reserved.  Bracing With Leg March (Hook-Lying)    With neutral spine, tighten pelvic floor and abdominals and hold. Alternating legs, lift foot _1__ inches and return to floor. Repeat _20__ times. Do _1__ times a day.   Copyright  VHI. All rights reserved.  Straight Leg Raise    Squeeze pelvic floor and hold. Tighten top of left thigh. Raise leg off bed _6__ inches. Hold for __1_ seconds. Relax for __1_ seconds. Repeat _10__ times. Do __1_ times a day. Repeat with other leg.    Copyright  VHI. All rights reserved.  Long Arc Knee Extension    Sit with left knee bent as much as possible. Squeeze pelvic floor and hold. Straighten knee. Hold for _1__ seconds. Relax for _1__ seconds. Repeat _20__ times.  Do _1__ times a day.    Copyright  VHI. All rights reserved.  [facility] [address] [place], [place] [address] Phone # [phone] Fax [phone]
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
## 2017-06-23 NOTE — Telephone Encounter (Signed)
Pt contacted and informed of negative STI results, pt was very happy and appreciative of the call.

## 2017-06-27 ENCOUNTER — Telehealth: Payer: Self-pay | Admitting: Psychology
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
## 2017-06-28 LAB — URINE CYTOLOGY ANCILLARY ONLY
BACTERIAL VAGINITIS: NEGATIVE
Candida vaginitis: NEGATIVE

## 2017-06-29 ENCOUNTER — Encounter: Payer: Medicare Other | Admitting: Physical Therapy

## 2017-07-06 ENCOUNTER — Encounter: Payer: Self-pay | Admitting: Physical Therapy

## 2017-07-06 ENCOUNTER — Ambulatory Visit: Payer: Medicare Other | Admitting: Physical Therapy

## 2017-07-06 DIAGNOSIS — R278 Other lack of coordination: Secondary | ICD-10-CM | POA: Diagnosis not present

## 2017-07-06 DIAGNOSIS — M6281 Muscle weakness (generalized): Secondary | ICD-10-CM

## 2017-07-06 NOTE — Patient Instructions (Addendum)
   TODO Pedal Exerciser Foot Peddler Desk Bike Foldable with LCD Monitor Mini-Squats (Standing)    Stand with support. Bend knees slightly. Tighten pelvic floor. Hold for _5__ seconds. Return to straight standing. Relax for _5_ seconds. Repeat _10__ times. Do __1_ times a day.  Copyright  VHI. All rights reserved.  With Support    Stand on one leg in neutral spine holding support. Hold __15__ seconds. Repeat on other leg. Do _3___ repetitions,each leg, 1 time per day http://bt.exer.us/35   Copyright  VHI. All rights reserved.  Chair Sitting    Sit at edge of seat, spine straight, one leg extended. Put a hand on each thigh and bend forward from the hip, keeping spine straight. Allow hand on extended leg to reach toward toes. Support upper body with other arm. Hold _30__ seconds. Repeat _1__ times per session. Do __1_ sessions per day.  Copyright  VHI. All rights reserved.  Piriformis Stretch, Sitting    Sit, one ankle on opposite knee, same-side hand on crossed knee. Push down on knee, keeping spine straight. Lean torso forward, with flat back, until tension is felt in hamstrings and gluteals of crossed-leg side. Hold _30__ seconds.  Repeat __1_ times per session. Do _1__ sessions per day.  Copyright  VHI. All rights reserved.  [facility] [address] [place], [place] [address] Phone # [phone] Fax [phone]
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
## 2017-07-13 ENCOUNTER — Ambulatory Visit: Payer: Medicare Other | Attending: Family Medicine | Admitting: Physical Therapy

## 2017-07-13 ENCOUNTER — Ambulatory Visit (INDEPENDENT_AMBULATORY_CARE_PROVIDER_SITE_OTHER): Payer: Medicare Other | Admitting: Internal Medicine

## 2017-07-13 ENCOUNTER — Encounter: Payer: Self-pay | Admitting: Internal Medicine

## 2017-07-13 ENCOUNTER — Encounter: Payer: Self-pay | Admitting: Physical Therapy

## 2017-07-13 ENCOUNTER — Encounter: Payer: Self-pay | Admitting: Gastroenterology

## 2017-07-13 ENCOUNTER — Other Ambulatory Visit: Payer: Self-pay

## 2017-07-13 VITALS — BP 128/60 | HR 73 | Temp 97.9°F | Wt 192.0 lb

## 2017-07-13 DIAGNOSIS — Z1211 Encounter for screening for malignant neoplasm of colon: Secondary | ICD-10-CM | POA: Diagnosis not present

## 2017-07-13 DIAGNOSIS — R278 Other lack of coordination: Secondary | ICD-10-CM | POA: Diagnosis not present

## 2017-07-13 DIAGNOSIS — M6281 Muscle weakness (generalized): Secondary | ICD-10-CM | POA: Insufficient documentation

## 2017-07-13 DIAGNOSIS — T7421XS Adult sexual abuse, confirmed, sequela: Secondary | ICD-10-CM

## 2017-07-13 DIAGNOSIS — N39498 Other specified urinary incontinence: Secondary | ICD-10-CM

## 2017-07-13 DIAGNOSIS — R748 Abnormal levels of other serum enzymes: Secondary | ICD-10-CM

## 2017-07-13 NOTE — Patient Instructions (Addendum)
Thank you for coming in   I made a referral to the GI doctor for colonoscopy I also drew blood to check your liver enzymes again.   FOllow up in 3 months with Dr. Gwendlyn Deutscher

## 2017-07-13 NOTE — Assessment & Plan Note (Signed)
Symptoms resolved after pelvic rehab

## 2017-07-13 NOTE — Assessment & Plan Note (Signed)
Will repeat CMP. Patient is asymptomatic
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
## 2017-07-13 NOTE — Assessment & Plan Note (Signed)
Appears to be doing much better. She is smiling, happy, and laughing during the visit is different when we first met. We did not delve into mood per patient's request (mentioned to me by her PT) since her son is present today.

## 2017-07-14 LAB — HEPATIC FUNCTION PANEL
ALK PHOS: 79 IU/L (ref 39–117)
ALT: 48 IU/L — AB (ref 0–32)
AST: 53 IU/L — ABNORMAL HIGH (ref 0–40)
Albumin: 4.3 g/dL (ref 3.5–4.8)
BILIRUBIN, DIRECT: 0.25 mg/dL (ref 0.00–0.40)
Bilirubin Total: 0.8 mg/dL (ref 0.0–1.2)
Total Protein: 7.6 g/dL (ref 6.0–8.5)

## 2017-07-18 ENCOUNTER — Telehealth: Payer: Self-pay | Admitting: Internal Medicine

## 2017-07-18 DIAGNOSIS — R945 Abnormal results of liver function studies: Principal | ICD-10-CM

## 2017-07-18 DIAGNOSIS — R7989 Other specified abnormal findings of blood chemistry: Secondary | ICD-10-CM

## 2017-07-18 NOTE — Telephone Encounter (Signed)
Called patient to report of continued mild elevation of AST/ALT.  - discussed next steps in obtaining an RUQ [place] and hepatitis panel for further evaluation.  - patient knows to make a lab visit at clinic of hepatitis panel.  - patient knows blue team will call to provide appointment for RUQ [place]  [place] team, please schedule RUQ [place] for patient.

## 2017-07-19 ENCOUNTER — Other Ambulatory Visit: Payer: Medicare Other

## 2017-07-19 NOTE — Telephone Encounter (Signed)
Patient scheduled for ultrasound on 07-25-17 and will plan to get her follow up labs on that same day in our office. Darian Cansler,CMA

## 2017-07-25 ENCOUNTER — Other Ambulatory Visit: Payer: Medicare Other

## 2017-07-25 ENCOUNTER — Ambulatory Visit (HOSPITAL_COMMUNITY)
Admission: RE | Admit: 2017-07-25 | Discharge: 2017-07-25 | Disposition: A | Discharge status: Home / Self Care | Payer: Medicare Other | Source: Ambulatory Visit | Attending: Family Medicine | Admitting: Family Medicine

## 2017-07-25 ENCOUNTER — Encounter: Payer: Self-pay | Admitting: Internal Medicine

## 2017-07-25 DIAGNOSIS — K7689 Other specified diseases of liver: Secondary | ICD-10-CM | POA: Diagnosis not present

## 2017-07-25 DIAGNOSIS — R7989 Other specified abnormal findings of blood chemistry: Secondary | ICD-10-CM | POA: Insufficient documentation

## 2017-07-25 DIAGNOSIS — R945 Abnormal results of liver function studies: Principal | ICD-10-CM

## 2017-07-25 NOTE — Progress Notes (Signed)
Sent letter regarding unremarkable RUQ [place].

## 2017-07-26 LAB — HEPATITIS PANEL, ACUTE
Hep A IgM: NEGATIVE
Hep B C IgM: NEGATIVE
Hep C Virus Ab: <0.1 {s_co_ratio} (ref 0.0–0.9)
Hepatitis B Surface Ag: NEGATIVE

## 2017-08-19 ENCOUNTER — Other Ambulatory Visit: Payer: Self-pay | Admitting: Family Medicine

## 2017-08-19 DIAGNOSIS — K219 Gastro-esophageal reflux disease without esophagitis: Secondary | ICD-10-CM

## 2017-08-24 ENCOUNTER — Other Ambulatory Visit: Payer: Self-pay | Admitting: Family Medicine

## 2017-09-07 ENCOUNTER — Other Ambulatory Visit: Payer: Self-pay

## 2017-09-07 ENCOUNTER — Ambulatory Visit (AMBULATORY_SURGERY_CENTER): Payer: Self-pay

## 2017-09-07 VITALS — Ht 60.0 in | Wt 189.0 lb

## 2017-09-07 DIAGNOSIS — Z1211 Encounter for screening for malignant neoplasm of colon: Secondary | ICD-10-CM

## 2017-09-07 MED ORDER — NA SULFATE-K SULFATE-MG SULF 17.5-3.13-1.6 GM/177ML PO SOLN: 1.0000 | Freq: Once | ORAL | 0 refills | Status: AC

## 2017-09-07 NOTE — Progress Notes (Signed)
No egg or soy allergy known to patient  No issues with past sedation with any surgeries  or procedures, no intubation problems  No diet pills per patient No home 02 use per patient  No blood thinners per patient  Pt denies issues with constipation  No A fib or A flutter  EMMI video sent to pt's e mail  

## 2017-09-12 ENCOUNTER — Encounter: Payer: Self-pay | Admitting: Gastroenterology

## 2017-09-21 ENCOUNTER — Encounter: Payer: Self-pay | Admitting: Gastroenterology

## 2017-09-21 ENCOUNTER — Ambulatory Visit (AMBULATORY_SURGERY_CENTER): Payer: Medicare Other | Admitting: Gastroenterology

## 2017-09-21 ENCOUNTER — Other Ambulatory Visit: Payer: Self-pay

## 2017-09-21 VITALS — BP 138/73 | HR 69 | Temp 97.1°F | Resp 15 | Ht 65.0 in | Wt 192.0 lb

## 2017-09-21 DIAGNOSIS — Z538 Procedure and treatment not carried out for other reasons: Secondary | ICD-10-CM | POA: Diagnosis not present

## 2017-09-21 DIAGNOSIS — Z1211 Encounter for screening for malignant neoplasm of colon: Secondary | ICD-10-CM | POA: Diagnosis present

## 2017-09-21 DIAGNOSIS — D123 Benign neoplasm of transverse colon: Secondary | ICD-10-CM | POA: Diagnosis not present

## 2017-09-21 MED ORDER — SODIUM CHLORIDE 0.9 % IV SOLN: 500.0000 mL | Freq: Once | INTRAVENOUS | Status: DC

## 2017-09-21 NOTE — Progress Notes (Signed)
Called to room to assist during endoscopic procedure.  Patient ID and intended procedure confirmed with present staff. Received instructions for my participation in the procedure from the performing physician.  

## 2017-09-21 NOTE — Patient Instructions (Signed)
*  Handout given to son on polyps.  YOU HAD AN ENDOSCOPIC PROCEDURE TODAY AT [facility]:   Refer to the procedure report that was given to you for any specific questions about what was found during the examination.  If the procedure report does not answer your questions, please call your gastroenterologist to clarify.  If you requested that your care partner not be given the details of your procedure findings, then the procedure report has been included in a sealed envelope for you to review at your convenience later.  YOU SHOULD EXPECT: Some feelings of bloating in the abdomen. Passage of more gas than usual.  Walking can help get rid of the air that was put into your GI tract during the procedure and reduce the bloating. If you had a lower endoscopy (such as a colonoscopy or flexible sigmoidoscopy) you may notice spotting of blood in your stool or on the toilet paper. If you underwent a bowel prep for your procedure, you may not have a normal bowel movement for a few days.  Please Note:  You might notice some irritation and congestion in your nose or some drainage.  This is from the oxygen used during your procedure.  There is no need for concern and it should clear up in a day or so.  SYMPTOMS TO REPORT IMMEDIATELY:   Following lower endoscopy (colonoscopy or flexible sigmoidoscopy):  Excessive amounts of blood in the stool  Significant tenderness or worsening of abdominal pains  Swelling of the abdomen that is new, acute  Fever of 100F or higher   For urgent or emergent issues, a gastroenterologist can be reached at any hour by calling [phone].   DIET:  We do recommend a small meal at first, but then you may proceed to your regular diet.  Drink plenty of fluids but you should avoid alcoholic beverages for 24 hours.  ACTIVITY:  You should plan to take it easy for the rest of today and you should NOT DRIVE or use heavy machinery until tomorrow (because of the sedation  medicines used during the test).    FOLLOW UP: Our staff will call the number listed on your records the next business day following your procedure to check on you and address any questions or concerns that you may have regarding the information given to you following your procedure. If we do not reach you, we will leave a message.  However, if you are feeling well and you are not experiencing any problems, there is no need to return our call.  We will assume that you have returned to your regular daily activities without incident.  If any biopsies were taken you will be contacted by phone or by letter within the next 1-3 weeks.  Please call us at [phone] if you have not heard about the biopsies in 3 weeks.    SIGNATURES/CONFIDENTIALITY: You and/or your care partner have signed paperwork which will be entered into your electronic medical record.  These signatures attest to the fact that that the information above on your After Visit Summary has been reviewed and is understood.  Full responsibility of the confidentiality of this discharge information lies with you and/or your care-partner.

## 2017-09-21 NOTE — Progress Notes (Signed)
Report given to PACU, vss 

## 2017-09-21 NOTE — Progress Notes (Signed)
Transverse polyp removed but not retrieved.

## 2017-09-21 NOTE — Progress Notes (Signed)
Pt's states no medical or surgical changes since previsit or office visit. 

## 2017-09-21 NOTE — Op Note (Signed)
[facility] Patient Name: Desiree Parker Procedure Date: 09/21/2017 11:57 AM MRN: [id number] Endoscopist: [place]. Loletha Carrow , MD Age: 72 Referring MD:  Date of Birth: Mar 08, 1946 Gender: Female Account #: [id number] Procedure:                Colonoscopy Indications:              Screening for colorectal malignant neoplasm                            (reported prior colonoscopy > 10 years ago, no                            report located) Medicines:                Monitored Anesthesia Care Procedure:                Pre-Anesthesia Assessment:                           - Prior to the procedure, a History and Physical                            was performed, and patient medications and                            allergies were reviewed. The patient's tolerance of                            previous anesthesia was also reviewed. The risks                            and benefits of the procedure and the sedation                            options and risks were discussed with the patient.                            All questions were answered, and informed consent                            was obtained. Anticoagulants: The patient has taken                            aspirin. It was decided not to withhold this                            medication prior to the procedure. ASA Grade                            Assessment: III - A patient with severe systemic                            disease. After reviewing the risks and benefits,  the patient was deemed in satisfactory condition to                            undergo the procedure.                           After obtaining informed consent, the colonoscope                            was passed under direct vision. Throughout the                            procedure, the patient's blood pressure, pulse, and                            oxygen saturations were monitored continuously. The   Model PCF-[id number]) scope was introduced                            through the anus and advanced to the the cecum,                            identified by appendiceal orifice and ileocecal                            valve. The colonoscopy was performed with                            difficulty due to inadequate bowel prep. The                            patient tolerated the procedure well. The quality                            of the bowel preparation was poor. The ileocecal                            valve, appendiceal orifice, and rectum were                            photographed. The bowel preparation used was                            SUPREP. However, patient did not follow dietary                            instructions and took all prep evening before                            instead of split dose. Scope In: 12:07:16 PM Scope Out: 12:18:27 PM Scope Withdrawal Time: 0 hours 7 minutes 39 seconds  Total Procedure Duration: 0 hours 11 minutes 11 seconds  Findings:                 The perianal and digital rectal examinations  were                            normal.                           Stool was found in the entire colon, precluding                            visualization.                           A 3 mm polyp was found in the transverse colon. The                            polyp was sessile. The polyp was removed with a                            cold snare. Resection was complete, but the polyp                            tissue was not retrieved.                           The exam was otherwise without abnormality on                            direct and retroflexion views (given limitations of                            poor preparation). Complications:            No immediate complications. Estimated Blood Loss:     Estimated blood loss was minimal. Impression:               - Preparation of the colon was poor.                           - Stool in the  entire examined colon.                           - One 3 mm polyp in the transverse colon, removed                            with a cold snare. Complete resection. Polyp tissue                            not retrieved.                           - The examination was otherwise normal on direct                            and retroflexion views. Recommendation:           - Patient has a contact number available for  emergencies. The signs and symptoms of potential                            delayed complications were discussed with the                            patient. Return to normal activities tomorrow.                            Written discharge instructions were provided to the                            patient.                           - Resume previous diet.                           - Continue present medications.                           - Repeat colonoscopy within 6 months because the                            bowel preparation was poor. 2 day bowel preparation                            required. Henry L. Loletha Carrow, MD 09/21/2017 12:28:26 PM This report has been signed electronically.

## 2017-09-22 ENCOUNTER — Telehealth: Payer: Self-pay | Admitting: *Deleted

## 2017-09-22 NOTE — Telephone Encounter (Signed)
  Follow up Call-  Call back number 09/21/2017  Post procedure Call Back phone  # [phone]  Permission to leave phone message Yes  Some recent data might be hidden     Patient questions:  Do you have a fever, pain , or abdominal swelling? No. Pain Score  0 *  Have you tolerated food without any problems? Yes.    Have you been able to return to your normal activities? Yes.    Do you have any questions about your discharge instructions: Diet   No. Medications  No. Follow up visit  No.  Do you have questions or concerns about your Care? No.  Actions: * If pain score is 4 or above: No action needed, pain <4.

## 2017-11-14 ENCOUNTER — Other Ambulatory Visit: Payer: Self-pay | Admitting: Family Medicine

## 2017-11-14 DIAGNOSIS — K219 Gastro-esophageal reflux disease without esophagitis: Secondary | ICD-10-CM

## 2017-11-14 DIAGNOSIS — Z9114 Patient's other noncompliance with medication regimen: Secondary | ICD-10-CM

## 2018-01-13 ENCOUNTER — Other Ambulatory Visit: Payer: Self-pay | Admitting: Family Medicine

## 2018-01-13 DIAGNOSIS — M810 Age-related osteoporosis without current pathological fracture: Secondary | ICD-10-CM

## 2018-02-07 ENCOUNTER — Encounter: Payer: Self-pay | Admitting: Family Medicine

## 2018-02-08 ENCOUNTER — Ambulatory Visit (INDEPENDENT_AMBULATORY_CARE_PROVIDER_SITE_OTHER): Payer: Medicare Other | Admitting: Family Medicine

## 2018-02-08 ENCOUNTER — Encounter: Payer: Self-pay | Admitting: Family Medicine

## 2018-02-08 ENCOUNTER — Other Ambulatory Visit: Payer: Self-pay

## 2018-02-08 VITALS — BP 128/70 | HR 75 | Temp 98.7°F | Ht 59.2 in | Wt 194.0 lb

## 2018-02-08 DIAGNOSIS — J449 Chronic obstructive pulmonary disease, unspecified: Secondary | ICD-10-CM

## 2018-02-08 DIAGNOSIS — E559 Vitamin D deficiency, unspecified: Secondary | ICD-10-CM

## 2018-02-08 DIAGNOSIS — E119 Type 2 diabetes mellitus without complications: Secondary | ICD-10-CM

## 2018-02-08 DIAGNOSIS — M810 Age-related osteoporosis without current pathological fracture: Secondary | ICD-10-CM

## 2018-02-08 DIAGNOSIS — I5032 Chronic diastolic (congestive) heart failure: Secondary | ICD-10-CM

## 2018-02-08 LAB — POCT GLYCOSYLATED HEMOGLOBIN (HGB A1C): HbA1c, POC (controlled diabetic range): 6 % (ref 0.0–7.0)

## 2018-02-08 NOTE — Assessment & Plan Note (Signed)
Grade 1 Diastolic dysfunction, with preserved EF> Intermittent exertional SOB. Likely multifactorial in the setting of COPD. I reviewed her ECHO. Consider repeat ECHO if this persists or worsen. She agreed with the plan.

## 2018-02-08 NOTE — Patient Instructions (Signed)
It was nice seeing you today. Please schedule your bone density exam and a pulmonary function test with Dr. Valentina Lucks. I will like to see you back in 4 months. Call if you have any question.

## 2018-02-08 NOTE — Assessment & Plan Note (Signed)
I discussed the various comorbid conditions associated with morbid obesity including: DM2, Heart disease. She is interested in losing weight. Her goal is to lose few pounds 1-2 over the next month.  I recommended a reduction in caloric intake, advised regarding diet, snacking and exercise.  Referral to Nutrition Services discussed. She will consider at f/u. She will follow-up in 3-6 month(s) for reassessment and weight management.

## 2018-02-08 NOTE — Assessment & Plan Note (Signed)
Hx of Vit D deficiency. Compliant with supplements and Bi-phosphonate. Recheck Vit D today. Recheck Bone density scan. Instruction given on how to schedule appointment.

## 2018-02-08 NOTE — Assessment & Plan Note (Signed)
Albuterol prn. I recommended PFT. She will schedule with Dr. Valentina Lucks. Consider starting controller medicine after PFT. F/U soon if symptomatic or go to the ED.

## 2018-02-08 NOTE — Assessment & Plan Note (Signed)
Diet controlled. A1C of 6 today. Continue diet and exercise plan.

## 2018-02-08 NOTE — Progress Notes (Signed)
Subjective:     Patient ID: Desiree Parker, female   DOB: [birth year], 72 y.o.   MRN: [id number]  HPI CHF/COPD: Get short of breath on exertion once in a while. No wheezing, no cough or chest pain. DM2: Not on meds. Here for f/u. No recent eye eval by her ophthalmologist. Obesity: She is working on diet and exercise. Here for f/u. Vitamin D def: Compliant with her supplement. Here for check up. Osteoporosis:Compliant with Fosamax. Here for f/u.   Current Outpatient Medications on File Prior to Visit  Medication Sig Dispense Refill  . alendronate (FOSAMAX) 70 MG tablet TAKE 1 TABLET(70 MG) BY MOUTH EVERY 7 DAYS WITH A FULL GLASS OF WATER AND ON AN EMPTY STOMACH 12 tablet 0  . aspirin 81 MG EC tablet Take 81 mg by mouth daily.     [name] atorvastatin (LIPITOR) 20 MG tablet TAKE 1 TABLET(20 MG) BY MOUTH DAILY AT 6 PM 90 tablet 3  . calcium-vitamin D (SM CALCIUM 500/VITAMIN D3) 500-400 MG-UNIT tablet Take by mouth.    [name] lisinopril (PRINIVIL,ZESTRIL) 10 MG tablet TAKE 1 TABLET(10 MG) BY MOUTH DAILY 90 tablet 1  . omeprazole (PRILOSEC) 20 MG capsule TAKE ONE CAPSULE BY MOUTH DAILY AS NEEDED. PROLONGED USE MAY CAUSE RENAL IMPAIRMENT 90 capsule 0  . acetaminophen (TYLENOL) 325 MG tablet Take 650 mg by mouth every 4 (four) hours as needed for mild pain.    [name] albuterol (PROVENTIL HFA;VENTOLIN HFA) 108 (90 Base) MCG/ACT inhaler Inhale 2 puffs into the lungs every 6 (six) hours as needed for wheezing. (Patient not taking: Reported on 02/08/2018) 2 Inhaler 1  . ipratropium-albuterol (DUONEB) 0.5-2.5 (3) MG/3ML SOLN Take 3 mLs by nebulization every 6 (six) hours as needed (Cough/SOB).     Current Facility-Administered Medications on File Prior to Visit  Medication Dose Route Frequency Provider Last Rate Last Dose  . 0.9 %  sodium chloride infusion  500 mL Intravenous Once Doran Stabler, MD       Past Medical History:  Diagnosis Date  . Alternating exotropia   . Arterial insufficiency (HCC) 02/04/2004    ABI 0.84  . CAP (community acquired pneumonia) 10/2015  . Chest pain 09/11/2015  . COPD (chronic obstructive pulmonary disease) ([place])   . Degenerative disk disease 10/2005   Lumbar, anterolithesis, L4-5,5-S1  . DEVELOPMENTAL READING DISORDER UNSPECIFIED 07/18/2007   Qualifier: Diagnosis of  By: Walker Kehr MD, Patrick Jupiter    . Diabetes mellitus without complication ([place])   . Estrogen deficiency 09/22/2015  . Headache 03/11/2016  . Hilar mass    CT Chest 10/2015 IMPRESSION: No demonstrable pulmonary embolus. There is infiltrate consistent with pneumonia in portions of the right lower lobe. There is patchy atelectasis in the left lower lobe. There is no appreciable adenopathy. The prominence in the right hilum noted previously is apparently due to vascular prominence as opposed to mass or adenopathy. There are foci of aortic atheroscleroti  . 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 Noncompliance with medications 09/11/2015  . TOBACCO USER 03/04/2009   Snuff dipper      Review of Systems  Respiratory: Negative.   Cardiovascular: Negative.   Gastrointestinal: Negative.   Musculoskeletal: Negative.   Neurological: Negative.   All other systems reviewed and are negative.      Objective:   Physical Exam  Constitutional: She is oriented to person, place, and time. She appears well-developed. No distress.  Cardiovascular: Normal rate, regular rhythm and normal heart sounds.  No murmur heard. Pulmonary/Chest: Effort normal  and breath sounds normal. No stridor. No respiratory distress. She has no wheezes.  Abdominal: Soft. Bowel sounds are normal. She exhibits no distension and no mass. There is no tenderness. There is no guarding.  Musculoskeletal: Normal range of motion. She exhibits no edema.  Sensory exam of the foot is normal, tested with the monofilament. Good pulses, no lesions or ulcers, good peripheral pulses.  +Bunions   Neurological: She is alert and oriented to person, place, and time. No cranial  nerve deficit.  Nursing note and vitals reviewed.      Assessment:     CHF COPD DM2 Obesity Vit D deficiency Osteoporosis    Plan:     Check problem list.

## 2018-02-09 ENCOUNTER — Telehealth: Payer: Self-pay | Admitting: Family Medicine

## 2018-02-09 LAB — VITAMIN D 25 HYDROXY (VIT D DEFICIENCY, FRACTURES): VIT D 25 HYDROXY: 17.6 ng/mL — AB (ref 30.0–100.0)

## 2018-02-09 MED ORDER — VITAMIN D (ERGOCALCIFEROL) 1.25 MG (50000 UNIT) PO CAPS: 50000.0000 [IU] | ORAL_CAPSULE | ORAL | 1 refills | Status: DC

## 2018-02-09 MED ORDER — CALCIUM GLUCONATE 500 MG PO TABS: 1.0000 | ORAL_TABLET | Freq: Two times a day (BID) | ORAL | 99 refills | Status: DC

## 2018-02-09 NOTE — Telephone Encounter (Signed)
Result discussed with her. Start Vit D 50,000IU weekly. D/C [place] D May start OTC Calcium supplement in addition.

## 2018-02-12 ENCOUNTER — Other Ambulatory Visit: Payer: Self-pay | Admitting: Family Medicine

## 2018-02-12 DIAGNOSIS — K219 Gastro-esophageal reflux disease without esophagitis: Secondary | ICD-10-CM

## 2018-02-23 ENCOUNTER — Ambulatory Visit (INDEPENDENT_AMBULATORY_CARE_PROVIDER_SITE_OTHER): Payer: Medicare Other | Admitting: Pharmacist

## 2018-02-23 ENCOUNTER — Encounter: Payer: Self-pay | Admitting: Pharmacist

## 2018-02-23 DIAGNOSIS — J449 Chronic obstructive pulmonary disease, unspecified: Secondary | ICD-10-CM | POA: Diagnosis not present

## 2018-02-23 NOTE — Progress Notes (Signed)
   S:    Patient arrives in good spirits and ambulating well.    Presents for lung function evaluation.   Patient was referred by Dr. Gwendlyn Deutscher (referred on 02/08/2018).  Patient was last seen by Primary Care Provider on 02/08/2018.  Patient reports adherence to medications Rescue inhaler use frequency: twice a month   O: Physical Exam  Constitutional: She appears well-developed and well-nourished.  Pulmonary/Chest: Effort normal.  Vitals reviewed.    Review of Systems  Respiratory: Positive for shortness of breath.        Dyspnea with exertion     Vitals:   02/23/18 1047  BP: (!) 142/72  Pulse: 67  SpO2: 98%    See "scanned report" or Documentation Flowsheet (discrete results - PFTs) for Spirometry results. Patient provided good effort while attempting spirometry.   Lung Age = 73 Albuterol Neb  Lot# [id number]     Exp. 08/09/2017  A/P: Patient has been taking albuterol prn for shortness of breath on exertion. Spirometry evaluation with pre- and post-bronchodilator reveals near normal lung function.  Increase in single post neb result was thought to be erroneous.  -no change in medications. Continue to use albuterol inhaler as needed.  -Reviewed results of pulmonary function tests.  Pt verbalized understanding of results and education.   -Discussed exercise plan with patient. She set a goal of increasing the number of laps she walks around the track from 2 laps twice a week to 3 laps twice a week.  Written pt instructions provided.  F/U Clinic visit in 4 months with Dr.Eniola.    Total time in face to face counseling 30 minutes.  Patient seen with Andee Poles, Pharmacy Student and Harrietta Guardian, PharmD PGY1 Pharmacy Resident.

## 2018-02-23 NOTE — Patient Instructions (Addendum)
It was great to meet you today!  Your lung function test showed near normal lung function. Please continue to use your albuterol inhaler as needed.  We talked about your walking plan today, and set a new goal of 3 laps around the track twice a week. This will help improve your lung function!  Please follow up with Dr. Gwendlyn Deutscher in four months.

## 2018-02-23 NOTE — Assessment & Plan Note (Signed)
Patient has been taking albuterol prn for shortness of breath on exertion. Spirometry evaluation with pre- and post-bronchodilator reveals near normal lung function.  Increase in single post neb result was thought to be erroneous.  -no change in medications. Continue to use albuterol inhaler as needed.  -Reviewed results of pulmonary function tests.  Pt verbalized understanding of results and education.   -Discussed exercise plan with patient. She set a goal of increasing the number of laps she walks around the track from 2 laps twice a week to 3 laps twice a week.

## 2018-03-23 ENCOUNTER — Ambulatory Visit
Admission: RE | Admit: 2018-03-23 | Discharge: 2018-03-23 | Disposition: A | Discharge status: Home / Self Care | Payer: Medicare Other | Source: Ambulatory Visit | Attending: Family Medicine | Admitting: Family Medicine

## 2018-03-23 ENCOUNTER — Telehealth: Payer: Self-pay | Admitting: Family Medicine

## 2018-03-23 DIAGNOSIS — M8588 Other specified disorders of bone density and structure, other site: Secondary | ICD-10-CM | POA: Diagnosis not present

## 2018-03-23 DIAGNOSIS — Z780 Asymptomatic menopausal state: Secondary | ICD-10-CM | POA: Diagnosis not present

## 2018-03-23 DIAGNOSIS — M810 Age-related osteoporosis without current pathological fracture: Secondary | ICD-10-CM

## 2018-03-23 NOTE — Telephone Encounter (Signed)
Dexa scan result discussed with her. Continue Fosamax.  She mentioned that her female best friend died this morning. I offered grief counseling appointment with our [facility] specialist but she declined. She stated that she will be fine. F/U soon if needed.

## 2018-04-06 ENCOUNTER — Other Ambulatory Visit: Payer: Self-pay | Admitting: Family Medicine

## 2018-04-06 DIAGNOSIS — M810 Age-related osteoporosis without current pathological fracture: Secondary | ICD-10-CM

## 2018-05-13 ENCOUNTER — Other Ambulatory Visit: Payer: Self-pay | Admitting: Family Medicine

## 2018-07-14 ENCOUNTER — Telehealth: Payer: Self-pay

## 2018-07-14 NOTE — Telephone Encounter (Signed)
Pt called nurse line stating she went to pick up her fosamax and it was ~100 dollars. I called the pharmacy for patient, as she has not had an issue before getting this medication. The pharmacist stated her insurance expired at the end of February, all they need is her new card information. Informed patient of this. She is going to call her insurance to get new card info.

## 2018-08-11 ENCOUNTER — Other Ambulatory Visit: Payer: Self-pay | Admitting: Family Medicine

## 2018-08-11 DIAGNOSIS — K219 Gastro-esophageal reflux disease without esophagitis: Secondary | ICD-10-CM

## 2018-09-22 ENCOUNTER — Other Ambulatory Visit: Payer: Self-pay

## 2018-09-22 DIAGNOSIS — J449 Chronic obstructive pulmonary disease, unspecified: Secondary | ICD-10-CM

## 2018-09-22 MED ORDER — ALBUTEROL SULFATE HFA 108 (90 BASE) MCG/ACT IN AERS: 2.0000 | INHALATION_SPRAY | Freq: Four times a day (QID) | RESPIRATORY_TRACT | 4 refills | Status: DC | PRN

## 2018-10-03 ENCOUNTER — Other Ambulatory Visit: Payer: Self-pay | Admitting: Family Medicine

## 2018-10-03 DIAGNOSIS — M810 Age-related osteoporosis without current pathological fracture: Secondary | ICD-10-CM

## 2018-11-01 ENCOUNTER — Encounter: Payer: Self-pay | Admitting: Gastroenterology

## 2018-11-09 ENCOUNTER — Other Ambulatory Visit: Payer: Self-pay | Admitting: Family Medicine

## 2018-11-09 DIAGNOSIS — Z9114 Patient's other noncompliance with medication regimen: Secondary | ICD-10-CM

## 2018-12-14 ENCOUNTER — Encounter: Payer: Self-pay | Admitting: Gastroenterology

## 2019-01-05 ENCOUNTER — Ambulatory Visit (AMBULATORY_SURGERY_CENTER): Payer: Self-pay

## 2019-01-05 ENCOUNTER — Other Ambulatory Visit: Payer: Self-pay

## 2019-01-05 ENCOUNTER — Encounter: Payer: Self-pay | Admitting: Gastroenterology

## 2019-01-05 VITALS — Ht 60.0 in | Wt 207.4 lb

## 2019-01-05 DIAGNOSIS — Z86010 Personal history of colonic polyps: Secondary | ICD-10-CM

## 2019-01-05 MED ORDER — NA SULFATE-K SULFATE-MG SULF 17.5-3.13-1.6 GM/177ML PO SOLN: 1.0000 | Freq: Once | ORAL | 0 refills | Status: AC

## 2019-01-05 NOTE — Progress Notes (Signed)
Denies allergies to eggs or soy products. Denies complication of anesthesia or sedation. Denies use of weight loss medication. Denies use of O2.   Emmi instructions given for colonoscopy.  Patient was given a two day prep. Patients son was present in [place] to help his mother with the instructions. Extra time was required for pre-visit. Patient states she understands instructions.

## 2019-01-18 ENCOUNTER — Telehealth: Payer: Self-pay

## 2019-01-18 NOTE — Telephone Encounter (Signed)
Covid-19 screening questions   Do you now or have you had a fever in the last 14 days? NO   Do you have any respiratory symptoms of shortness of breath or cough now or in the last 14 days? NO  Do you have any family members or close contacts with diagnosed or suspected Covid-19 in the past 14 days? NO  Have you been tested for Covid-19 and found to be positive? NO        

## 2019-01-19 ENCOUNTER — Ambulatory Visit (AMBULATORY_SURGERY_CENTER): Payer: Medicare Other | Admitting: Gastroenterology

## 2019-01-19 ENCOUNTER — Other Ambulatory Visit: Payer: Self-pay

## 2019-01-19 ENCOUNTER — Encounter: Payer: Self-pay | Admitting: Gastroenterology

## 2019-01-19 VITALS — BP 168/85 | HR 85 | Temp 97.9°F | Resp 16 | Ht 60.0 in | Wt 207.4 lb

## 2019-01-19 DIAGNOSIS — Z1211 Encounter for screening for malignant neoplasm of colon: Secondary | ICD-10-CM | POA: Diagnosis not present

## 2019-01-19 DIAGNOSIS — D127 Benign neoplasm of rectosigmoid junction: Secondary | ICD-10-CM | POA: Diagnosis not present

## 2019-01-19 DIAGNOSIS — D125 Benign neoplasm of sigmoid colon: Secondary | ICD-10-CM | POA: Diagnosis not present

## 2019-01-19 DIAGNOSIS — Z86010 Personal history of colonic polyps: Secondary | ICD-10-CM | POA: Diagnosis not present

## 2019-01-19 DIAGNOSIS — D123 Benign neoplasm of transverse colon: Secondary | ICD-10-CM | POA: Diagnosis not present

## 2019-01-19 MED ORDER — SODIUM CHLORIDE 0.9 % IV SOLN: 500.0000 mL | Freq: Once | INTRAVENOUS | Status: DC

## 2019-01-19 NOTE — Op Note (Signed)
[facility] Patient Name: Desiree Parker Procedure Date: 01/19/2019 8:22 AM MRN: [id number] Endoscopist: Mallie Mussel L. Loletha Carrow , MD Age: 73 Referring MD:  Date of Birth: Aug 07, 1945 Gender: Female Account #: [id number] Procedure:                Colonoscopy Indications:              Surveillance: Personal history of colonic polyps                            with unknown histology on last colonoscopy                            (inadequate bowel preparation) less than 3 years                            ago (09/2017) Medicines:                Monitored Anesthesia Care Procedure:                Pre-Anesthesia Assessment:                           - Prior to the procedure, a History and Physical                            was performed, and patient medications and                            allergies were reviewed. The patient's tolerance of                            previous anesthesia was also reviewed. The risks                            and benefits of the procedure and the sedation                            options and risks were discussed with the patient.                            All questions were answered, and informed consent                            was obtained. Prior Anticoagulants: The patient has                            taken no previous anticoagulant or antiplatelet                            agents except for aspirin. ASA Grade Assessment: II                            - A patient with mild systemic disease. After  reviewing the risks and benefits, the patient was                            deemed in satisfactory condition to undergo the                            procedure.                           After obtaining informed consent, the colonoscope                            was passed under direct vision. Throughout the                            procedure, the patient's blood pressure, pulse, and                            oxygen  saturations were monitored continuously. The                            Colonoscope was introduced through the anus and                            advanced to the the cecum, identified by                            appendiceal orifice and ileocecal valve. The                            colonoscopy was performed without difficulty. The                            patient tolerated the procedure well. The quality                            of the bowel preparation was excellent. The                            ileocecal valve, appendiceal orifice, and rectum                            were photographed. The bowel preparation used was 2                            day Suprep/Miralax via split dose instruction. Scope In: 8:28:42 AM Scope Out: 8:47:57 AM Scope Withdrawal Time: 0 hours 12 minutes 1 second  Total Procedure Duration: 0 hours 19 minutes 15 seconds  Findings:                 The perianal and digital rectal examinations were                            normal.  A diminutive polyp was found in the transverse                            colon. The polyp was sessile. The polyp was removed                            with a cold snare. Resection and retrieval were                            complete.                           A 6 mm polyp was found in the recto-sigmoid colon.                            The polyp was semi-pedunculated. The polyp was                            removed with a hot snare. Resection and retrieval                            were complete. (both polyps sent in same pathology                            jar)                           The exam was otherwise without abnormality on                            direct and retroflexion views. Complications:            No immediate complications. Estimated Blood Loss:     Estimated blood loss was minimal. Impression:               - One diminutive polyp in the transverse colon,                             removed with a cold snare. Resected and retrieved.                           - One 6 mm polyp at the recto-sigmoid colon,                            removed with a hot snare. Resected and retrieved.                           - The examination was otherwise normal on direct                            and retroflexion views. Recommendation:           - Patient has a contact number available for  emergencies. The signs and symptoms of potential                            delayed complications were discussed with the                            patient. Return to normal activities tomorrow.                            Written discharge instructions were provided to the                            patient.                           - Resume previous diet.                           - Continue present medications.                           - Await pathology results.                           - No repeat colonoscopy. Adonis Yim L. Loletha Carrow, MD 01/19/2019 8:52:36 AM This report has been signed electronically.

## 2019-01-19 NOTE — Progress Notes (Signed)
To PACU, VSS. Report to [email or web address] 

## 2019-01-19 NOTE — Progress Notes (Signed)
Temp by JB and vitals by [place].   Pt appeared short of breath once undressed and on the stretcher.  She states that she has not used her inhaler this morning and feels short of breath.  Instructed her to use the Albuterol.

## 2019-01-19 NOTE — Patient Instructions (Signed)
Discharge instructions given. Handout on polyps. Resume previous medications. YOU HAD AN ENDOSCOPIC PROCEDURE TODAY AT THE [facility]:   Refer to the procedure report that was given to you for any specific questions about what was found during the examination.  If the procedure report does not answer your questions, please call your gastroenterologist to clarify.  If you requested that your care partner not be given the details of your procedure findings, then the procedure report has been included in a sealed envelope for you to review at your convenience later.  YOU SHOULD EXPECT: Some feelings of bloating in the abdomen. Passage of more gas than usual.  Walking can help get rid of the air that was put into your GI tract during the procedure and reduce the bloating. If you had a lower endoscopy (such as a colonoscopy or flexible sigmoidoscopy) you may notice spotting of blood in your stool or on the toilet paper. If you underwent a bowel prep for your procedure, you may not have a normal bowel movement for a few days.  Please Note:  You might notice some irritation and congestion in your nose or some drainage.  This is from the oxygen used during your procedure.  There is no need for concern and it should clear up in a day or so.  SYMPTOMS TO REPORT IMMEDIATELY:   Following lower endoscopy (colonoscopy or flexible sigmoidoscopy):  Excessive amounts of blood in the stool  Significant tenderness or worsening of abdominal pains  Swelling of the abdomen that is new, acute  Fever of 100F or higher   For urgent or emergent issues, a gastroenterologist can be reached at any hour by calling [phone].   DIET:  We do recommend a small meal at first, but then you may proceed to your regular diet.  Drink plenty of fluids but you should avoid alcoholic beverages for 24 hours.  ACTIVITY:  You should plan to take it easy for the rest of today and you should NOT DRIVE or use heavy  machinery until tomorrow (because of the sedation medicines used during the test).    FOLLOW UP: Our staff will call the number listed on your records 48-72 hours following your procedure to check on you and address any questions or concerns that you may have regarding the information given to you following your procedure. If we do not reach you, we will leave a message.  We will attempt to reach you two times.  During this call, we will ask if you have developed any symptoms of COVID 19. If you develop any symptoms (ie: fever, flu-like symptoms, shortness of breath, cough etc.) before then, please call [phone].  If you test positive for Covid 19 in the 2 weeks post procedure, please call and report this information to us.    If any biopsies were taken you will be contacted by phone or by letter within the next 1-3 weeks.  Please call us at [phone] if you have not heard about the biopsies in 3 weeks.    SIGNATURES/CONFIDENTIALITY: You and/or your care partner have signed paperwork which will be entered into your electronic medical record.  These signatures attest to the fact that that the information above on your After Visit Summary has been reviewed and is understood.  Full responsibility of the confidentiality of this discharge information lies with you and/or your care-partner. 

## 2019-01-23 ENCOUNTER — Telehealth: Payer: Self-pay | Admitting: *Deleted

## 2019-01-23 NOTE — Telephone Encounter (Signed)
  Follow up Call-  Call back number 01/19/2019 09/21/2017  Post procedure Call Back phone  # [phone]  Permission to leave phone message Yes Yes  Some recent data might be hidden     Patient questions:  Do you have a fever, pain , or abdominal swelling? No. Pain Score  0 *  Have you tolerated food without any problems? Yes.    Have you been able to return to your normal activities? Yes.    Do you have any questions about your discharge instructions: Diet   No. Medications  No. Follow up visit  No.  Do you have questions or concerns about your Care? No.  Actions: * If pain score is 4 or above: No action needed, pain <4.  1. Have you developed a fever since your procedure? no  2.   Have you had an respiratory symptoms (SOB or cough) since your procedure? no  3.   Have you tested positive for COVID 19 since your procedure no  4.   Have you had any family members/close contacts diagnosed with the COVID 19 since your procedure?  no   If yes to any of these questions please route to Joylene John, RN and Alphonsa Gin, Therapist, sports.

## 2019-01-24 ENCOUNTER — Encounter: Payer: Self-pay | Admitting: Gastroenterology

## 2019-02-07 ENCOUNTER — Other Ambulatory Visit: Payer: Self-pay | Admitting: Family Medicine

## 2019-02-07 DIAGNOSIS — K219 Gastro-esophageal reflux disease without esophagitis: Secondary | ICD-10-CM

## 2019-02-20 ENCOUNTER — Other Ambulatory Visit: Payer: Self-pay | Admitting: Family Medicine

## 2019-02-20 DIAGNOSIS — I5032 Chronic diastolic (congestive) heart failure: Secondary | ICD-10-CM

## 2019-02-20 DIAGNOSIS — E669 Obesity, unspecified: Secondary | ICD-10-CM

## 2019-02-20 DIAGNOSIS — J449 Chronic obstructive pulmonary disease, unspecified: Secondary | ICD-10-CM

## 2019-02-20 DIAGNOSIS — I10 Essential (primary) hypertension: Secondary | ICD-10-CM

## 2019-02-23 ENCOUNTER — Telehealth: Payer: Self-pay | Admitting: Family Medicine

## 2019-02-23 NOTE — Chronic Care Management (AMB) (Signed)
  Chronic Care Management   Note  02/23/2019 Name: Desiree Parker MRN: [id number] DOB: 02-12-1946  Desiree Parker is a 73 y.o. year old female who is a primary care patient of Eniola, Phill Myron, MD. I reached out to Desiree Parker by phone today in response to a referral sent by Desiree Parker's PCP, Dr. Andrena Mews     Desiree Parker was given information about Chronic Care Management services today including:  1. CCM service includes personalized support from designated clinical staff supervised by her physician, including individualized plan of care and coordination with other care providers 2. 24/7 contact phone numbers for assistance for urgent and routine care needs. 3. Service will only be billed when office clinical staff spend 20 minutes or more in a month to coordinate care. 4. Only one practitioner may furnish and bill the service in a calendar month. 5. The patient may stop CCM services at any time (effective at the end of the month) by phone call to the office staff. 6. The patient will be responsible for cost sharing (co-pay) of up to 20% of the service fee (after annual deductible is met).  Patient agreed to services and verbal consent obtained.   Follow up plan: Telephone appointment with CCM team member scheduled for:02/26/2019  Glenna Durand LPN Nurse Health Advisor . [facility]  [email or web address] ??[phone]

## 2019-02-26 ENCOUNTER — Ambulatory Visit: Payer: Medicare Other

## 2019-02-26 NOTE — Patient Instructions (Signed)
Visit Information  Goals Addressed            This Visit's Progress   . " I dont check my blood pressures regularly" (pt-stated)       Current Barriers:  [name] Knowledge Deficits related to diease management of hypertension  and self care management  Case Manager Clinical Goal(s):  [name] Over the next 30 days, patient will verbalize understanding of plan for hypertension management . Over the next 30 days, patient will attend all scheduled medical appointments: Next appointment scheduled  03/07/19 at 850 am.  Patient notified . Over the next 90 days, patient will demonstrate improved adherence to prescribed treatment plan for hypertension as evidenced by taking all medications as prescribed, monitoring and recording blood pressure as directed, adhering to low sodium/DASH diet  Interventions:  . Evaluation of current treatment plan related to hypertension self management and patient's adherence to plan as established by provider. . Reviewed medications with patient and discussed importance of compliance . Discussed plans with patient for ongoing care management follow up and provided patient with direct contact information for care management team   Patient Self Care Activities:  . Self administers medications as prescribed . Attends all scheduled provider appointments  Initial goal documentation      . "If I do to much I can't breath well" (pt-stated)       Current Barriers:  [name] Knowledge deficits related to basic COPD self care/management  Nurse Case Manager Clinical Goal(s):  [name] Over the next 14 days, patient will verbalize understanding of plan for COPD . Over the next 60 days, patient will work with RN Case Manager to address needs related to COPD . Over the next 60 days, patient will attend all scheduled medical appointments: regarding her COPD.  Interventions:  . Evaluation of current treatment plan related to COPD and patient's adherence to plan as established by provider. .  Provided education to patient re: COPD action Plan . Reviewed medications with patient and discussed inhaler . Collaborated with front desk regarding making an appointment for patient visit. Patient appointment on 03/07/19 @ 850 am.  Patient has been notified. . Discussed plans with patient for ongoing care management follow up and provided patient with direct contact information for care management team  Patient Self Care Activities:  . Self administers medications as prescribed . Performs ADL's independently . Performs IADL's independently . Unable to independently symptoms of shortness of breath with exertion  Initial goal documentation        COPD Action Plan A COPD action plan is a description of what to do when you have a flare (exacerbation) of chronic obstructive pulmonary disease (COPD). Your action plan is a color-coded plan that lists the symptoms that indicate whether or not your condition is under control and what actions to take.  If you have symptoms in the green zone, it means you are doing well that day.  If you have symptoms in the yellow zone, it means you are having a bad day or an exacerbation.  If you have symptoms in the red zone, you need urgent medical care. Follow the plan you and your health care provider developed. Review your plan with your health care provider at each visit. Red zone Symptoms in this zone mean that you should get medical help right away. They include:  Feeling very short of breath, even when you are resting.  Not being able to do any activities because of poor breathing.  Not being able to  sleep because of poor breathing.  Fever or shaking chills.  Feeling confused or very sleepy.  Chest pain.  Coughing up blood. If you have any of these symptoms, call emergency services (911 in the [place] or go to the nearest emergency room. Yellow zone Symptoms in this zone mean that your condition may be getting worse. They include:   Feeling more short of breath than usual.  Having less energy for daily activities than usual.  Phlegm or mucus that is thicker than usual.  Needing to use your rescue inhaler or nebulizer more often than usual.  More ankle swelling than usual.  Coughing more than usual.  Feeling like you have a chest cold.  Trouble sleeping due to COPD symptoms.  Decreased appetite.  COPD medicines not helping as much as usual. If you experience any "yellow" symptoms:  Keep taking your daily medicines as directed.  Use your quick-relief inhaler as told by your health care provider.  If you were prescribed steroid medicine to take by mouth (oral medicine), start taking it as told by your health care provider.  If you were prescribed an antibiotic, start taking it as told by your health care provider. Do not stop taking the antibiotic even if you start to feel better.  Use oxygen as told by your health care provider.  Get more rest.  Do your pursed-lip breathing exercises.  Do not smoke. Avoid any irritants in the air. If your signs and symptoms do not improve after taking these steps, call your health care provider right away. Green zone Symptoms in this zone mean that you are doing well. They include:  Being able to do your usual activities and exercise.  Having the usual amount of coughing, including the same amount of phlegm or mucus.  Being able to sleep well.  Having a good appetite. Follow these instructions at home:  Continue taking your daily medicines as told by your health care provider.  Make sure you receive all the immunizations that your health care provider recommends, especially the pneumococcal and influenza vaccines.  Wash your hands often with soap and water. Have family members wash their hands too. Regular hand washing can help prevent infections.  Follow your usual exercise and diet plan.  Avoid irritants in the air, such as smoke.  Do not use any  products that contain nicotine or tobacco, such as cigarettes and e-cigarettes. If you need help quitting, ask your health care provider. Where to find more information: You can find more information about COPD from:  American Lung Association, My COPD Action Plan: SlotDealers.si.pdf  COPD Foundation: www.copdfoundation.[place]: http://cline.com/ This information is not intended to replace advice given to you by your health care provider. Make sure you discuss any questions you have with your health care provider. Document Released: 08/11/2016 Document Revised: 08/03/2017 Document Reviewed: 08/11/2016 Elsevier Patient Education  2020 [place].  Desiree Parker was given information about Care Management services today including:  1. Care Management services include personalized support from designated clinical staff supervised by her physician, including individualized plan of care and coordination with other care providers 2. 24/7 contact phone numbers for assistance for urgent and routine care needs. 3. The patient may stop CCM services at any time (effective at the end of the month) by phone call to the office staff.  Patient agreed to services and verbal consent obtained.   The patient verbalized understanding of instructions provided today and declined a print copy  of patient instruction materials.   The care management team will reach out to the patient again over the next 14 days.   [name] RN, BSN, [facility] Care Management Coordinator [place] Phone: [phone] [place] Office: [phone] Fax: [phone]

## 2019-02-26 NOTE — Chronic Care Management (AMB) (Signed)
Care Management   Initial Visit Note  02/26/2019 Name: Desiree Parker MRN: [id number] DOB: 04/30/1945  Subjective:   Objective:  Assessment: Desiree Parker is a 73 y.o. year old female who sees [name], MD for primary care. The care management team was consulted for assistance with care management and care coordination needs related to Disease Management Educational Needs.   Review of patient status, including review of consultants reports, relevant laboratory and other test results, and collaboration with appropriate care team members and the patient's provider was performed as part of comprehensive patient evaluation and provision of care management services.    SDOH (Social Determinants of Health) screening performed today: None. See Care Plan for related entries.    Outpatient Encounter Medications as of 02/26/2019  Medication Sig Note  . acetaminophen (TYLENOL) 325 MG tablet Take 650 mg by mouth every 4 (four) hours as needed for mild pain.   [name] albuterol (VENTOLIN HFA) 108 (90 Base) MCG/ACT inhaler Inhale 2 puffs into the lungs every 6 (six) hours as needed for wheezing.   [name] alendronate (FOSAMAX) 70 MG tablet TAKE 1 TABLET BY MOUTH EVERY 7 DAYS WITH A FULL GLASS OF WATER AND ON AN EMPTY STOMACH   . aspirin 81 MG EC tablet Take 81 mg by mouth daily.    [name] atorvastatin (LIPITOR) 20 MG tablet TAKE 1 TABLET(20 MG) BY MOUTH DAILY AT 6 PM   . calcium gluconate 500 MG tablet Take 1 tablet (500 mg total) by mouth 2 (two) times daily.   [name] lisinopril (ZESTRIL) 10 MG tablet TAKE 1 TABLET(10 MG) BY MOUTH DAILY   . omeprazole (PRILOSEC) 20 MG capsule TAKE ONE CAPSULE BY MOUTH DAILY AS NEEDED, PROLONGED USE MAY CAUSE RENAL IMPARIMENT 02/26/2019: Takes  1 daily  . Vitamin D, Ergocalciferol, (DRISDOL) 50000 units CAPS capsule Take 1 capsule (50,000 Units total) by mouth every 7 (seven) days. (Patient not taking: Reported on 02/26/2019)    Facility-Administered Encounter Medications as of  02/26/2019  Medication  . 0.9 %  sodium chloride infusion  . 0.9 %  sodium chloride infusion    Goals Addressed            This Visit's Progress   . " I dont check my blood pressures regularly" (pt-stated)       Current Barriers:  [name] Knowledge Deficits related to diease management of hypertension  and self care management  Case Manager Clinical Goal(s):  [name] Over the next 30 days, patient will verbalize understanding of plan for hypertension management . Over the next 30 days, patient will attend all scheduled medical appointments: Next appointment scheduled  03/07/19 at 850 am.  Patient notified . Over the next 90 days, patient will demonstrate improved adherence to prescribed treatment plan for hypertension as evidenced by taking all medications as prescribed, monitoring and recording blood pressure as directed, adhering to low sodium/DASH diet  Interventions:  . Evaluation of current treatment plan related to hypertension self management and patient's adherence to plan as established by provider. . Reviewed medications with patient and discussed importance of compliance . Discussed plans with patient for ongoing care management follow up and provided patient with direct contact information for care management team   Patient Self Care Activities:  . Self administers medications as prescribed . Attends all scheduled provider appointments  Initial goal documentation      . "If I do to much I can't breath well" (pt-stated)       Current Barriers:  .  Knowledge deficits related to basic COPD self care/management  Nurse Case Manager Clinical Goal(s):  [name] Over the next 14 days, patient will verbalize understanding of plan for COPD . Over the next 60 days, patient will work with RN Case Manager to address needs related to COPD . Over the next 60 days, patient will attend all scheduled medical appointments: regarding her COPD.  Interventions:  . Evaluation of current treatment plan  related to COPD and patient's adherence to plan as established by provider. . Provided education to patient re: COPD action Plan . Reviewed medications with patient and discussed inhaler . Collaborated with front desk regarding making an appointment for patient visit. Patient appointment on 03/07/19 @ 850 am.  Patient has been notified. . Discussed plans with patient for ongoing care management follow up and provided patient with direct contact information for care management team  Patient Self Care Activities:  . Self administers medications as prescribed . Performs ADL's independently . Performs IADL's independently . Unable to independently symptoms of shortness of breath with exertion  Initial goal documentation         Follow up plan:  The care management team will reach out to the patient again over the next 14 days.  The patient has been provided with contact information for the care management team and has been advised to call with any health related questions or concerns.   Desiree Parker was given information about Care Management services today including:  1. Care Management services include personalized support from designated clinical staff supervised by a physician, including individualized plan of care and coordination with other care providers 2. 24/7 contact phone numbers for assistance for urgent and routine care needs. 3. The patient may stop Care Management services at any time (effective at the end of the month) by phone call to the office staff.  Patient agreed to services and verbal consent obtained.  [name] RN, BSN, [facility] Care Management Coordinator [place] Phone: [phone] [place] Office: [phone] Fax: [phone]

## 2019-03-07 ENCOUNTER — Other Ambulatory Visit: Payer: Self-pay

## 2019-03-07 ENCOUNTER — Ambulatory Visit (INDEPENDENT_AMBULATORY_CARE_PROVIDER_SITE_OTHER): Payer: Medicare Other | Admitting: Family Medicine

## 2019-03-07 ENCOUNTER — Encounter: Payer: Self-pay | Admitting: Family Medicine

## 2019-03-07 VITALS — BP 152/68 | HR 91 | Wt 211.0 lb

## 2019-03-07 DIAGNOSIS — R0600 Dyspnea, unspecified: Secondary | ICD-10-CM

## 2019-03-07 DIAGNOSIS — J449 Chronic obstructive pulmonary disease, unspecified: Secondary | ICD-10-CM

## 2019-03-07 MED ORDER — SPIRIVA HANDIHALER 18 MCG IN CAPS: 18.0000 ug | ORAL_CAPSULE | Freq: Every morning | RESPIRATORY_TRACT | 12 refills | Status: DC

## 2019-03-07 NOTE — Patient Instructions (Signed)
It was a pleasure to see you today! Thank you for choosing [facility] for your primary care. Desiree Parker was seen for shortness of breath.   1.  For your shortness of breath, this is likely COPD.  We are starting you on a daily controller inhaler called Spiriva.  Use this to help you had improvement in your shortness of breath.  You can also use your albuterol inhaler as needed for acute attacks of shortness of breath.    2.  Get a better examination of your lungs, we are to refer you to the lung doctors.  They will help [place] provide better treatment evaluation for your shortness of breath.  Our office will call you with the scheduled appointment.  3.  This is possibly related to your heart, however this is less likely.  We are ordering a picture of your heart.  We will call you when this is scheduled.   4.  We are ordering some basic blood work to rule out other causes of shortness of breath and abnormalities.  We will call you with the results.    Come back to the clinic in 1 month to follow-up your breathing.  Go to the emergency department if you have extreme shortness of breath that does not improve with your albuterol inhaler, severe chest pain, racing heart or any other worrisome symptom.  Best,  Marny Lowenstein, MD, MS FAMILY MEDICINE RESIDENT - PGY3 03/07/2019 9:20 AM

## 2019-03-07 NOTE — Addendum Note (Signed)
Addended by: Josephine Igo B on: 03/07/2019 11:38 AM   Modules accepted: Orders

## 2019-03-07 NOTE — Progress Notes (Signed)
    Subjective:  Desiree Parker is a 73 y.o. female who presents to the [facility] today with a chief complaint of dyspnea on exertion.   HPI:  Patient is accompanied by her adult son.  Patient reports dyspnea on exertion since April.  It is not associated with chest pain, sputum production, cough, fevers, chills, weight change, lower extremity swelling.  Patient uses her albuterol inhaler 2 times a day for improvement.  Also get improvement with rest.  Patient with a 50-pack-year smoking history.  She is not smoking now.  Patient echo in 2017 showed HFpEF with EF of 55 to 123456 grade 1 diastolic dysfunction.  Patient spirometry in 02/2018 was near normal.  Patient reports that she is unable to keep up with her peers at church when walking.  Patient weight is up about 4 pounds since October.  ROS: Per HPI  PMH: Smoking history reviewed.    Objective:  Physical Exam: BP (!) 152/68   Pulse 91   Wt 211 lb (95.7 kg)   SpO2 97%   BMI 41.21 kg/m   Gen: NAD, resting comfortably CV: RRR with no murmurs appreciated Pulm: NWOB, CTAB with no crackles, wheezes, or rhonchi GI:  Soft, Nontender, Nondistended. MSK: Trace lower extremity edema, cyanosis, or clubbing noted Skin: warm, dry Neuro: grossly normal, moves all extremities Psych: Normal affect and thought content  No results found for this or any previous visit (from the past 72 hour(s)).   Assessment/Plan:  Dyspnea Likely related to COPD worsening.  No acute exacerbation.  Patient meets gold criteria A, will start LAMA. Counseled patient on concurrent use with albuterol inhaler.  Other causes of dyspnea could include worsening heart failure, will pursue echocardiogram.  Cannot rule out ILD, will refer to pulmonology for spirometry and further assessment.  Will get CBC and chemistries to rule out anemia or other abnormalities.  Patient follow-up in 1 month with PCP to assess dyspnea.  Return precautions discussed.    Lab Orders     CBC   Basic Metabolic Panel  Meds ordered this encounter  Medications  . tiotropium (SPIRIVA HANDIHALER) 18 MCG inhalation capsule    Sig: Place 1 capsule (18 mcg total) into inhaler and inhale every morning.    Dispense:  30 capsule    Refill:  [place], MD, MS FAMILY MEDICINE RESIDENT - PGY3 03/07/2019 9:54 AM

## 2019-03-07 NOTE — Assessment & Plan Note (Signed)
Likely related to COPD worsening.  No acute exacerbation.  Patient meets gold criteria A, will start LAMA. Counseled patient on concurrent use with albuterol inhaler.  Other causes of dyspnea could include worsening heart failure, will pursue echocardiogram.  Cannot rule out ILD, will refer to pulmonology for spirometry and further assessment.  Will get CBC and chemistries to rule out anemia or other abnormalities.  Patient follow-up in 1 month with PCP to assess dyspnea.  Return precautions discussed.

## 2019-03-08 LAB — BASIC METABOLIC PANEL
BUN/Creatinine Ratio: 9 — ABNORMAL LOW (ref 12–28)
BUN: 8 mg/dL (ref 8–27)
CO2: 27 mmol/L (ref 20–29)
Calcium: 9.3 mg/dL (ref 8.7–10.3)
Chloride: 103 mmol/L (ref 96–106)
Creatinine, Ser: 0.85 mg/dL (ref 0.57–1.00)
GFR calc Af Amer: 79 mL/min/{1.73_m2} (ref >59)
GFR calc non Af Amer: 69 mL/min/{1.73_m2} (ref >59)
Glucose: 97 mg/dL (ref 65–99)
Potassium: 4.2 mmol/L (ref 3.5–5.2)
Sodium: 142 mmol/L (ref 134–144)

## 2019-03-08 LAB — CBC
Hematocrit: 37.1 % (ref 34.0–46.6)
Hemoglobin: 12.7 g/dL (ref 11.1–15.9)
MCH: 30.2 pg (ref 26.6–33.0)
MCHC: 34.2 g/dL (ref 31.5–35.7)
MCV: 88 fL (ref 79–97)
Platelets: 257 10*3/uL (ref 150–450)
RBC: 4.21 x10E6/uL (ref 3.77–5.28)
RDW: 12.7 % (ref 11.7–15.4)
WBC: 8.4 10*3/uL (ref 3.4–10.8)

## 2019-03-09 ENCOUNTER — Other Ambulatory Visit: Payer: Self-pay | Admitting: Family Medicine

## 2019-03-09 DIAGNOSIS — M810 Age-related osteoporosis without current pathological fracture: Secondary | ICD-10-CM

## 2019-03-15 ENCOUNTER — Other Ambulatory Visit: Payer: Self-pay | Admitting: Family Medicine

## 2019-03-15 DIAGNOSIS — J449 Chronic obstructive pulmonary disease, unspecified: Secondary | ICD-10-CM

## 2019-03-15 MED ORDER — ALBUTEROL SULFATE HFA 108 (90 BASE) MCG/ACT IN AERS: 2.0000 | INHALATION_SPRAY | Freq: Four times a day (QID) | RESPIRATORY_TRACT | 6 refills | Status: DC | PRN

## 2019-03-26 ENCOUNTER — Ambulatory Visit (HOSPITAL_COMMUNITY): Payer: Medicare Other

## 2019-04-05 ENCOUNTER — Other Ambulatory Visit: Payer: Self-pay | Admitting: Family Medicine

## 2019-04-05 DIAGNOSIS — R0600 Dyspnea, unspecified: Secondary | ICD-10-CM

## 2019-04-05 DIAGNOSIS — J449 Chronic obstructive pulmonary disease, unspecified: Secondary | ICD-10-CM

## 2019-04-24 ENCOUNTER — Ambulatory Visit (HOSPITAL_COMMUNITY)
Admission: RE | Admit: 2019-04-24 | Discharge: 2019-04-24 | Disposition: A | Discharge status: Home / Self Care | Payer: Medicare Other | Source: Ambulatory Visit | Attending: Family Medicine | Admitting: Family Medicine

## 2019-04-24 ENCOUNTER — Other Ambulatory Visit: Payer: Self-pay

## 2019-04-24 DIAGNOSIS — I119 Hypertensive heart disease without heart failure: Secondary | ICD-10-CM | POA: Insufficient documentation

## 2019-04-24 DIAGNOSIS — J449 Chronic obstructive pulmonary disease, unspecified: Secondary | ICD-10-CM | POA: Diagnosis not present

## 2019-04-24 DIAGNOSIS — E119 Type 2 diabetes mellitus without complications: Secondary | ICD-10-CM | POA: Diagnosis not present

## 2019-04-24 DIAGNOSIS — R0600 Dyspnea, unspecified: Secondary | ICD-10-CM | POA: Diagnosis not present

## 2019-04-24 DIAGNOSIS — I071 Rheumatic tricuspid insufficiency: Secondary | ICD-10-CM | POA: Insufficient documentation

## 2019-04-24 DIAGNOSIS — E785 Hyperlipidemia, unspecified: Secondary | ICD-10-CM | POA: Diagnosis not present

## 2019-04-24 NOTE — Progress Notes (Signed)
  Echocardiogram 2D Echocardiogram has been performed.  Desiree Parker 04/24/2019, 9:04 AM

## 2019-04-25 ENCOUNTER — Encounter: Payer: Self-pay | Admitting: Family Medicine

## 2019-05-08 ENCOUNTER — Other Ambulatory Visit: Payer: Self-pay | Admitting: *Deleted

## 2019-05-08 DIAGNOSIS — Z9114 Patient's other noncompliance with medication regimen: Secondary | ICD-10-CM

## 2019-05-08 MED ORDER — LISINOPRIL 10 MG PO TABS: ORAL_TABLET | ORAL | 1 refills | Status: DC

## 2019-05-08 MED ORDER — ATORVASTATIN CALCIUM 20 MG PO TABS: ORAL_TABLET | ORAL | 1 refills | Status: DC

## 2019-06-05 ENCOUNTER — Other Ambulatory Visit: Payer: Self-pay

## 2019-06-05 ENCOUNTER — Ambulatory Visit (INDEPENDENT_AMBULATORY_CARE_PROVIDER_SITE_OTHER): Payer: Medicare Other | Admitting: Family Medicine

## 2019-06-05 ENCOUNTER — Other Ambulatory Visit: Payer: Self-pay | Admitting: Family Medicine

## 2019-06-05 ENCOUNTER — Telehealth: Payer: Self-pay | Admitting: *Deleted

## 2019-06-05 ENCOUNTER — Encounter: Payer: Self-pay | Admitting: Family Medicine

## 2019-06-05 VITALS — BP 152/79 | HR 100 | Ht 60.0 in | Wt 208.0 lb

## 2019-06-05 DIAGNOSIS — M25562 Pain in left knee: Secondary | ICD-10-CM

## 2019-06-05 DIAGNOSIS — I5032 Chronic diastolic (congestive) heart failure: Secondary | ICD-10-CM

## 2019-06-05 DIAGNOSIS — E119 Type 2 diabetes mellitus without complications: Secondary | ICD-10-CM

## 2019-06-05 DIAGNOSIS — Z122 Encounter for screening for malignant neoplasm of respiratory organs: Secondary | ICD-10-CM

## 2019-06-05 DIAGNOSIS — E559 Vitamin D deficiency, unspecified: Secondary | ICD-10-CM

## 2019-06-05 DIAGNOSIS — Z1231 Encounter for screening mammogram for malignant neoplasm of breast: Secondary | ICD-10-CM

## 2019-06-05 DIAGNOSIS — M25561 Pain in right knee: Secondary | ICD-10-CM | POA: Diagnosis not present

## 2019-06-05 DIAGNOSIS — E785 Hyperlipidemia, unspecified: Secondary | ICD-10-CM

## 2019-06-05 DIAGNOSIS — I10 Essential (primary) hypertension: Secondary | ICD-10-CM

## 2019-06-05 DIAGNOSIS — E1169 Type 2 diabetes mellitus with other specified complication: Secondary | ICD-10-CM

## 2019-06-05 DIAGNOSIS — R0602 Shortness of breath: Secondary | ICD-10-CM

## 2019-06-05 DIAGNOSIS — G8929 Other chronic pain: Secondary | ICD-10-CM

## 2019-06-05 LAB — POCT GLYCOSYLATED HEMOGLOBIN (HGB A1C): HbA1c, POC (controlled diabetic range): 6.8 % (ref 0.0–7.0)

## 2019-06-05 MED ORDER — METFORMIN HCL ER 500 MG PO TB24: 500.0000 mg | ORAL_TABLET | Freq: Every day | ORAL | 1 refills | Status: DC

## 2019-06-05 NOTE — Assessment & Plan Note (Signed)
FLP checked today.

## 2019-06-05 NOTE — Assessment & Plan Note (Addendum)
Still having issues with SOB. Recent ECHO show EF 55-60% w/ G1DD which is stable for her. Her PFT done in 2019 was near normal. Repeat recommended once we are able to schedule. I encouraged her to use Spiriva daily rather than prn. Given smoking hx and her symptoms, we will go ahead with CT chest and Pulm referral.

## 2019-06-05 NOTE — Telephone Encounter (Signed)
-----   Message from [name], MD sent at 06/05/2019 12:27 PM EST ----- Hello Des, I reordered the CT lungs. Please help schedule as early as possible. Urgent. Thanks

## 2019-06-05 NOTE — Patient Instructions (Signed)
Mammogram °A mammogram is an X-ray of the breasts that is done to check for changes that are not normal. This test can screen for and find any changes that may suggest breast cancer. Mammograms are regularly done on women. A man may have a mammogram if he has a lump or swelling in his breast. This test can also help to find other changes and variations in the breast. °Tell a doctor: °· About any allergies you have. °· If you have breast implants. °· If you have had previous breast disease, biopsy, or surgery. °· If you are breastfeeding. °· If you are younger than age 25. °· If you have a family history of breast cancer. °· Whether you are pregnant or may be pregnant. °What are the risks? °Generally, this is a safe procedure. However, problems may occur, including: °· Exposure to radiation. Radiation levels are very low with this test. °· The results being misinterpreted. °· The need for further tests. °· The inability of the mammogram to detect certain cancers. °What happens before the procedure? °· Have this test done about 1-2 weeks after your period. This is usually when your breasts are the least tender. °· If you are visiting a new doctor or clinic, send any past mammogram images to your new doctor's office. °· Wash your breasts and under your arms the day of the test. °· Do not use deodorants, perfumes, lotions, or powders on the day of the test. °· Take off any jewelry from your neck. °· Wear clothes that you can change into and out of easily. °What happens during the procedure? ° °· You will undress from the waist up. You will put on a gown. °· You will stand in front of the X-ray machine. °· Each breast will be placed between two plastic or glass plates. The plates will press down on your breast for a few seconds. Try to stay as relaxed as possible. This does not cause any harm to your breasts. Any discomfort you feel will be very brief. °· X-rays will be taken from different angles of each breast. °The  procedure may vary among doctors and hospitals. °What happens after the procedure? °· The mammogram will be read by a specialist (radiologist). °· You may need to do certain parts of the test again. This depends on the quality of the images. °· Ask when your test results will be ready. Make sure you get your test results. °· You may go back to your normal activities. °Summary °· A mammogram is a low energy X-ray of the breasts that is done to check for abnormal changes. A man may have this test if he has a lump or swelling in his breast. °· Before the procedure, tell your doctor about any breast problems that you have had in the past. °· Have this test done about 1-2 weeks after your period. °· For the test, each breast will be placed between two plastic or glass plates. The plates will press down on your breast for a few seconds. °· The mammogram will be read by a specialist (radiologist). Ask when your test results will be ready. Make sure you get your test results. °This information is not intended to replace advice given to you by your health care provider. Make sure you discuss any questions you have with your health care provider. °Document Revised: 11/17/2017 Document Reviewed: 11/17/2017 °Elsevier Patient Education © 2020 Elsevier Inc. ° °

## 2019-06-05 NOTE — Telephone Encounter (Signed)
Pt scheduled and son informed of her appt. Deseree Kennon Holter, CMA

## 2019-06-05 NOTE — Assessment & Plan Note (Signed)
Rechecked lab today.

## 2019-06-05 NOTE — Progress Notes (Addendum)
SUBJECTIVE:   CHIEF COMPLAINT / HPI:   HTN/DM2: She is here for f/u, accompanied by her son. She is compliant with her Lisinopril 10 mg daily. Her last dose was at 7 AM today. Diet controlled DM.  COPD: Uses albuterol as needed as well as Spiriva as needed instead of daily as instructed. Her SOB remains an issue. She endorses SOB with ambulation, which improves with rest--associated with wheezing. Per her son, she will get short-winded with less than 30 feet walk. Denies chest pain. She endorsed hx of tobacco smoking, more than 30 pack-year. She quit more than 30 years ago.   Knee pain: B/L knee pain, which started more than two years ago. Pain is worsening, and she has difficulty climbing into the shower or standing for long. She is requesting a shower chair.  HLD/Vit D: Compliant with meds, here for f/u.   PERTINENT  PMH / PSH: Reviewed  OBJECTIVE:   Vitals:   06/05/19 0901 06/05/19 0931  BP: (!) 152/72 (!) 152/79  Pulse: 100   SpO2: 98%   Weight: 208 lb (94.3 kg)   Height: 5' (1.524 m)     Physical Exam Vitals and nursing note reviewed.  Constitutional:      Appearance: She is obese.  Cardiovascular:     Rate and Rhythm: Normal rate and regular rhythm.     Heart sounds: Normal heart sounds. No murmur.  Pulmonary:     Effort: Pulmonary effort is normal. No tachypnea.     Breath sounds: Normal breath sounds. No decreased breath sounds, wheezing or rhonchi.  Abdominal:     General: Abdomen is flat. Bowel sounds are normal. There is no distension.     Palpations: Abdomen is soft. There is no mass.     Tenderness: There is no abdominal tenderness.  Musculoskeletal:     Right lower leg: No edema.     Left lower leg: No edema.  Neurological:     Mental Status: She is alert and oriented to person, place, and time.  Ext: Normal ROM of both knees, mild crackles with passive movement of her knees. Mild tenderness of both knees. + B/L Bunions, no ulceration of her feet,  nails clean and short. Reduced dorsalis pedis B/L.     ASSESSMENT/PLAN:   HYPERTENSION, BENIGN SYSTEMIC BP slightly elevated. Goal for her with DM is <140/90. Plan to recheck BP in few weeks, if still elevated, I will increase her Lisinopril.   Diabetes mellitus, type II Her A1C continues to climb up. I discussed exercise for weight loss vs restarting her Metformin. She opted for restarting Metformin. Will start XL 500 daily for now and titrate up as needed.  Chronic diastolic heart failure ([place]) Still having issues with SOB. Recent ECHO show EF 55-60% w/ G1DD which is stable for her. Her PFT done in 2019 was near normal. Repeat recommended once we are able to schedule. I encouraged her to use Spiriva daily rather than prn. Given smoking hx and her symptoms, we will go ahead with CT chest and Pulm referral.   Chronic knee pain Xray ordered. May use Tylenol as needed. I will order DME for shower stool. Community message sent to [employer]. If unable to obtain DME, I will get CCM involved.  Hyperlipidemia FLP checked today.  Morbid obesity ([place]) Body mass index is 40.62 kg/m. Improved with her weight. She lost 3 lbs since last visit.  Her SOB prevents her from getting adequate exercise. She will continue to  work on her diet.  Vitamin D deficiency Rechecked lab today.   NB: Mammogram slip given to schedule mammogram and it was also ordered.  NB: She requested podiatrist referral for toenail grroming.  Andrena Mews, MD [place]

## 2019-06-05 NOTE — Assessment & Plan Note (Signed)
BP slightly elevated. Goal for her with DM is <140/90. Plan to recheck BP in few weeks, if still elevated, I will increase her Lisinopril.

## 2019-06-05 NOTE — Assessment & Plan Note (Addendum)
Xray ordered. May use Tylenol as needed. I will order DME for shower stool. Community message sent to [employer]. If unable to obtain DME, I will get CCM involved.

## 2019-06-05 NOTE — Assessment & Plan Note (Signed)
Body mass index is 40.62 kg/m. Improved with her weight. She lost 3 lbs since last visit.  Her SOB prevents her from getting adequate exercise. She will continue to work on her diet.

## 2019-06-05 NOTE — Assessment & Plan Note (Signed)
Her A1C continues to climb up. I discussed exercise for weight loss vs restarting her Metformin. She opted for restarting Metformin. Will start XL 500 daily for now and titrate up as needed.

## 2019-06-06 ENCOUNTER — Ambulatory Visit: Payer: Medicare Other

## 2019-06-06 ENCOUNTER — Encounter (HOSPITAL_COMMUNITY): Payer: Self-pay

## 2019-06-06 ENCOUNTER — Ambulatory Visit (HOSPITAL_COMMUNITY)
Admission: RE | Admit: 2019-06-06 | Discharge: 2019-06-06 | Disposition: A | Discharge status: Home / Self Care | Payer: Medicare Other | Source: Ambulatory Visit | Attending: Family Medicine | Admitting: Family Medicine

## 2019-06-06 ENCOUNTER — Telehealth: Payer: Self-pay | Admitting: Family Medicine

## 2019-06-06 DIAGNOSIS — R0602 Shortness of breath: Secondary | ICD-10-CM | POA: Diagnosis not present

## 2019-06-06 DIAGNOSIS — Z122 Encounter for screening for malignant neoplasm of respiratory organs: Secondary | ICD-10-CM | POA: Insufficient documentation

## 2019-06-06 LAB — BASIC METABOLIC PANEL
BUN/Creatinine Ratio: 13 (ref 12–28)
BUN: 11 mg/dL (ref 8–27)
CO2: 24 mmol/L (ref 20–29)
Calcium: 9.9 mg/dL (ref 8.7–10.3)
Chloride: 101 mmol/L (ref 96–106)
Creatinine, Ser: 0.88 mg/dL (ref 0.57–1.00)
GFR calc Af Amer: 76 mL/min/{1.73_m2} (ref >59)
GFR calc non Af Amer: 66 mL/min/{1.73_m2} (ref >59)
Glucose: 134 mg/dL — ABNORMAL HIGH (ref 65–99)
Potassium: 4 mmol/L (ref 3.5–5.2)
Sodium: 140 mmol/L (ref 134–144)

## 2019-06-06 LAB — LIPID PANEL
Chol/HDL Ratio: 2.5 ratio (ref 0.0–4.4)
Cholesterol, Total: 118 mg/dL (ref 100–199)
HDL: 47 mg/dL (ref >39)
LDL Chol Calc (NIH): 51 mg/dL (ref 0–99)
Triglycerides: 106 mg/dL (ref 0–149)
VLDL Cholesterol Cal: 20 mg/dL (ref 5–40)

## 2019-06-06 LAB — VITAMIN D 25 HYDROXY (VIT D DEFICIENCY, FRACTURES): Vit D, 25-Hydroxy: 15.2 ng/mL — ABNORMAL LOW (ref 30.0–100.0)

## 2019-06-06 MED ORDER — SODIUM CHLORIDE (PF) 0.9 % IJ SOLN
INTRAMUSCULAR | Status: AC
  Filled 2019-06-06: qty 50

## 2019-06-06 MED ORDER — IOHEXOL 300 MG/ML  SOLN
75.0000 mL | Freq: Once | INTRAMUSCULAR | Status: AC | PRN
  Administered 2019-06-06: 75 mL via INTRAVENOUS

## 2019-06-06 MED ORDER — VITAMIN D (ERGOCALCIFEROL) 1.25 MG (50000 UNIT) PO CAPS: 50000.0000 [IU] | ORAL_CAPSULE | ORAL | 1 refills | Status: DC

## 2019-06-06 NOTE — Addendum Note (Signed)
Addended by: Andrena Mews T on: 06/06/2019 07:45 AM   Modules accepted: Orders

## 2019-06-06 NOTE — Telephone Encounter (Signed)
Lab report discussed. Continue Vitamin D supplement and current dose of Lipitor. She agreed with the plan.

## 2019-06-07 ENCOUNTER — Telehealth: Payer: Self-pay | Admitting: Family Medicine

## 2019-06-07 NOTE — Telephone Encounter (Signed)
Result clarified with the radiologist and discussed with the patient.  Mammogram recommended for axillary LN, otherwise, there were no pulmonary mass or nodules or other concerning pathology.  Patient confirmed that she will schedule her mammogram as discussed during her last visit.  She feels better with her breathing, now the she is using Spiriva as instructed.  Still yet to schedule Pulm appointment. She will check with her son on that.  ED precaution discussed.

## 2019-06-07 NOTE — Telephone Encounter (Signed)
Patient calls nurse line requesting CT results from yesterday. Please advise.  

## 2019-06-07 NOTE — Telephone Encounter (Signed)
  Desiree Feil, MD Family Medicine Results  Reason for call  Conversation: Results (Newest Message First) Me      06/07/19 2:42 PM Note   Result clarified with the radiologist and discussed with the patient.   Mammogram recommended for axillary LN, otherwise, there were no pulmonary mass or nodules or other concerning pathology.   Patient confirmed that she will schedule her mammogram as discussed during her last visit.   She feels better with her breathing, now the she is using Spiriva as instructed.   Still yet to schedule Pulm appointment. She will check with her son on that.   ED precaution discussed

## 2019-06-08 NOTE — Telephone Encounter (Signed)
Patients son, Desiree Parker, calls frustrated that he was not the one contacted for these test results. Patient completed DPR on 06-05-2019 allowing her son to be made aware of this information. Told son, per Eniola's note, his mother would need to schedule a mammogram and an appointment with [facility].   Son says he called and scheduled her mammogram and they said she would have to wait 6 weeks from getting her second COVID vaccine. He asked me if that sounds correct and I told him if that's their policy then they would have to go by that.   Patients son is also requesting a referral for a home health aide to come out to the patients house.   Please call Desiree Parker back at [phone].

## 2019-06-08 NOTE — Telephone Encounter (Signed)
I called and discussed result with Desiree Parker. I also advised him to contact the front office to switch the person to contact and thep hone number to him as this was not documented or at least not seen by me.  He requested HHA for his mom. I will discuss with CCM. He also wanted glucometer for him mom, I will complete Rx form and place in the front office to fax.

## 2019-06-08 NOTE — Telephone Encounter (Signed)
Will forward to MD. Jazmin Hartsell,CMA  

## 2019-06-12 ENCOUNTER — Ambulatory Visit: Payer: Medicare Other

## 2019-06-12 ENCOUNTER — Other Ambulatory Visit: Payer: Self-pay

## 2019-06-12 NOTE — Chronic Care Management (AMB) (Signed)
  Care Management   Outreach Note  06/12/2019 Name: Desiree Parker MRN: [id number] DOB: [birth year]  Referred by: [name], MD Reason for referral : Care Coordination (Care Management [place])   An unsuccessful telephone outreach was attempted today. The patient was referred to the case management team for assistance with care management and care coordination.   Follow Up Plan: A HIPPA compliant phone message was left for the patient providing contact information and requesting a return call.  The care management team will reach out to the patient again over the next 5-7 days.   [name] RN, BSN, [facility] Care Management Coordinator [place] Phone: [phone] Fax: [phone]

## 2019-06-14 ENCOUNTER — Ambulatory Visit: Payer: Medicare Other

## 2019-06-14 ENCOUNTER — Other Ambulatory Visit: Payer: Self-pay

## 2019-06-14 NOTE — Chronic Care Management (AMB) (Signed)
  Care Management   Outreach Note  06/14/2019 Name: TEMPERANCE KUNDRAT MRN: [id number] DOB: 08/13/1945  Referred by: [name], MD Reason for referral : Care Coordination (Care Management [place])   A second unsuccessful telephone outreach was attempted today. The patient was referred to the case management team for assistance with care management and care coordination.   RN Case Manager called both numbers available on the release on information. Second number I was unable to leave a message the mailbox was full Follow Up Plan: A HIPPA compliant phone message was left for the patient providing contact information and requesting a return call.  The care management team will reach out to the patient again over the next 5-7 days.   [name] RN, BSN, [facility] Care Management Coordinator [place] Phone: [phone] Fax: [phone]

## 2019-06-14 NOTE — Progress Notes (Signed)
I have reviewed this visit and agree with the documentation.   

## 2019-06-20 ENCOUNTER — Other Ambulatory Visit: Payer: Self-pay

## 2019-06-20 ENCOUNTER — Ambulatory Visit: Payer: Medicare Other

## 2019-06-20 ENCOUNTER — Ambulatory Visit (INDEPENDENT_AMBULATORY_CARE_PROVIDER_SITE_OTHER): Payer: Medicare Other | Admitting: Internal Medicine

## 2019-06-20 ENCOUNTER — Encounter: Payer: Self-pay | Admitting: Internal Medicine

## 2019-06-20 VITALS — BP 132/74 | HR 83 | Temp 97.1°F | Ht 60.0 in | Wt 203.4 lb

## 2019-06-20 DIAGNOSIS — J4541 Moderate persistent asthma with (acute) exacerbation: Secondary | ICD-10-CM

## 2019-06-20 LAB — CBC WITH DIFFERENTIAL/PLATELET
Basophils Absolute: 0.1 10*3/uL (ref 0.0–0.1)
Basophils Relative: 0.6 % (ref 0.0–3.0)
Eosinophils Absolute: 0.3 10*3/uL (ref 0.0–0.7)
Eosinophils Relative: 2.7 % (ref 0.0–5.0)
HCT: 38.3 % (ref 36.0–46.0)
Hemoglobin: 13 g/dL (ref 12.0–15.0)
Lymphocytes Relative: 34 % (ref 12.0–46.0)
Lymphs Abs: 3.8 10*3/uL (ref 0.7–4.0)
MCHC: 34 g/dL (ref 30.0–36.0)
MCV: 88 fl (ref 78.0–100.0)
Monocytes Absolute: 0.8 10*3/uL (ref 0.1–1.0)
Monocytes Relative: 7.5 % (ref 3.0–12.0)
Neutro Abs: 6.2 10*3/uL (ref 1.4–7.7)
Neutrophils Relative %: 55.2 % (ref 43.0–77.0)
Platelets: 314 10*3/uL (ref 150.0–400.0)
RBC: 4.36 Mil/uL (ref 3.87–5.11)
RDW: 13.1 % (ref 11.5–15.5)
WBC: 11.3 10*3/uL — ABNORMAL HIGH (ref 4.0–10.5)

## 2019-06-20 MED ORDER — BREO ELLIPTA 100-25 MCG/INH IN AEPB: 1.0000 | INHALATION_SPRAY | Freq: Every day | RESPIRATORY_TRACT | 11 refills | Status: DC

## 2019-06-20 MED ORDER — BREO ELLIPTA 100-25 MCG/INH IN AEPB: 1.0000 | INHALATION_SPRAY | Freq: Every day | RESPIRATORY_TRACT | 0 refills | Status: DC

## 2019-06-20 NOTE — Patient Instructions (Addendum)
Breo sample, only use if (A) it helps and (B) it is affordable.  If it is not affordable call our office and we can find an alternative equivalent medication.  Keep up activity as able  PFTs  Blood work  Come back to see me in 4 weeks so we can see how you are doing.

## 2019-06-20 NOTE — Addendum Note (Signed)
Addended by: Parke Poisson E on: 06/20/2019 11:08 AM   Modules accepted: Orders

## 2019-06-20 NOTE — Chronic Care Management (AMB) (Signed)
  Care Management   Outreach Note  06/20/2019 Name: Desiree Parker MRN: [id number] DOB: 09/19/1945  Referred by: [name], MD Reason for referral : Care Coordination (Care Management RNCM 3rd attempt Home health)  3rd attempt reached out to [name] and explained the reason for the call. She stated the she knew nothing about needing Home Health that her never discussed this with her.  She states that she will let him know that I called.  I also called and left another message for the son Judge Stall with contact information to give me a call back.   Follow Up Plan: I will return the call in 5-7 business days to see if the family is still interested in wanting Home health services for [name].  [name] RN, BSN, [facility] Care Management Coordinator [place] Phone: [phone] Fax: [phone]

## 2019-06-20 NOTE — Progress Notes (Signed)
Patient seen in the office today and instructed on use of Breo 100.  Patient expressed understanding and demonstrated technique. Parke Poisson, [facility] 06/20/19

## 2019-06-20 NOTE — Progress Notes (Signed)
Synopsis: SOB  Assessment & Plan:  Problem 1 DOE, hx of smoking, wheezing- distant hx of smoking, lungs do not look particularly bad on CT.  Would treat as bronchospasm for now.  I suspect a large element of deconditioning. Problem 2 new left axillary adenopathy: mammogram scheduled for 07/13/19, defer management to PCP  - Continue spiriva - Add breo (sample given), told to fill if helps and is affordable - Check PFTs and TH2 labs - f/u 4 weeks, can space if doing better; if still feeling bad would consider sleep study if she is willing  MDM . I reviewed prior external note(s) from Dr. Gwendlyn Deutscher on 06/05/19 "Uses albuterol as needed as well as Spiriva as needed instead of daily as instructed. Her SOB remains an issue. She endorses SOB with ambulation, which improves with rest--associated with wheezing. Per her son, she will get short-winded with less than 30 feet walk. Denies chest pain. She endorsed hx of tobacco smoking, more than 30 pack-year. She quit more than 30 years ago. " . I reviewed the result(s) of echocardiogram 04/24/19 LVEF normal, G1 diastolic dysfunction, RV normal . I have ordered breo, IgE, CBC with diff   Review of patient's 06/06/19 CT [facility] images reveal no evidence of intrinsic lung disease, some minimal left axillary adenopathy. The patient's images have been independently reviewed by me.      End of visit medications:  Current Outpatient Medications:  .  acetaminophen (TYLENOL) 325 MG tablet, Take 650 mg by mouth every 4 (four) hours as needed for mild pain., Disp: , Rfl:  .  albuterol (VENTOLIN HFA) 108 (90 Base) MCG/ACT inhaler, Inhale 2 puffs into the lungs every 6 (six) hours as needed for wheezing., Disp: 18 g, Rfl: 6 .  alendronate (FOSAMAX) 70 MG tablet, TAKE 1 TABLET BY MOUTH EVERY 7 DAYS WITH A FULL GLASS OF WATER AND ON AN EMPTY STOMACH, Disp: 12 tablet, Rfl: 1 .  aspirin 81 MG EC tablet, Take 81 mg by mouth daily. , Disp: , Rfl:  .  atorvastatin (LIPITOR)  20 MG tablet, TAKE 1 TABLET(20 MG) BY MOUTH DAILY AT 6 PM, Disp: 90 tablet, Rfl: 1 .  calcium gluconate 500 MG tablet, Take 1 tablet (500 mg total) by mouth 2 (two) times daily., Disp: 60 tablet, Rfl: prn .  lisinopril (ZESTRIL) 10 MG tablet, TAKE 1 TABLET(10 MG) BY MOUTH DAILY, Disp: 90 tablet, Rfl: 1 .  metFORMIN (GLUCOPHAGE-XR) 500 MG 24 hr tablet, Take 1 tablet (500 mg total) by mouth daily with breakfast., Disp: 90 tablet, Rfl: 1 .  omeprazole (PRILOSEC) 20 MG capsule, TAKE ONE CAPSULE BY MOUTH DAILY AS NEEDED, PROLONGED USE MAY CAUSE RENAL IMPARIMENT, Disp: 90 capsule, Rfl: 1 .  SPIRIVA HANDIHALER 18 MCG inhalation capsule, PLACE 1 CAPSULE( 18 MCG TOTAL) INTO INHALER AND INHALE EVERY MORNING, Disp: 30 capsule, Rfl: 12 .  Vitamin D, Ergocalciferol, (DRISDOL) 1.25 MG (50000 UNIT) CAPS capsule, Take 1 capsule (50,000 Units total) by mouth every 7 (seven) days., Disp: 4 capsule, Rfl: 1 .  fluticasone furoate-vilanterol (BREO ELLIPTA) 100-25 MCG/INH AEPB, Inhale 1 puff into the lungs daily., Disp: 30 each, Rfl: 11  Current Facility-Administered Medications:  .  0.9 %  sodium chloride infusion, 500 mL, Intravenous, Once, Danis, Henry L III, MD .  0.9 %  sodium chloride infusion, 500 mL, Intravenous, Once, Danis, Kirke Corin, MD   Candee Furbish, MD [facility] 06/20/2019 10:41 AM    Subjective:   PATIENT  ID: Desiree Parker GENDER: female DOB: 07-18-1945, MRN: [id number]  Chief Complaint  Patient presents with  . Pulmonary Consult    referred by Dr. Gwendlyn Deutscher for dyspnea    HPI Here for DOE. - History of smoking 2ppd x 30 years quit 1990 - For past 6-8 months, went from [facility] to [facility] 2-3 dyspnea.  Insidious onset and progression - On no home oxygen - Visibly SOB in office but saturations okay - Dyspnea accompanied by wheezing, occasional dry cough - Denies history of asthma, seasonal allergies, sinus trouble - Accompanied by her son as she has some memory issues -  Recently started regimen is spiriva respimat which she has been using 6 times a day and PRN albuterol which she has been using 1-2 times a day - Son manages majority of her meds - No aspiration issues seen - Carries diagnosis of COPD, I do not see PFTs on file  Ancillary information including prior medications, full medical/surgical/family/social histoies, and PFTs (when available) are listed below and have been reviewed.   ROS + symptoms in bold Fevers, chills, weight loss Nausea, vomiting, diarrhea Shortness of breath, wheezing, cough Chest pain, palpitations, lower ext edema   Objective:   Vitals:   06/20/19 0956  BP: 132/74  Pulse: 83  Temp: (!) 97.1 F (36.2 C)  TempSrc: Temporal  SpO2: 97%  Weight: 203 lb 6.4 oz (92.3 kg)  Height: 5' (1.524 m)   97% on RA BMI Readings from Last 3 Encounters:  06/20/19 39.72 kg/m  06/05/19 40.62 kg/m  03/07/19 41.21 kg/m   Wt Readings from Last 3 Encounters:  06/20/19 203 lb 6.4 oz (92.3 kg)  06/05/19 208 lb (94.3 kg)  03/07/19 211 lb (95.7 kg)    GEN: frail woman in no acute distress HEENT: trachea midline, mucus membranes moist, malampatti 4 CV: Regular rate and rhythm, extremities are warm PULM: Clear bilaterally, no increased breath sounds on rapid breathing around larynx GI: Soft, +BS EXT: No edema NEURO: Moves all 4 extremities, globally weak   Ancillary Information    Past Medical History:  Diagnosis Date  . Alternating exotropia   . Arterial insufficiency (HCC) 02/04/2004   ABI 0.84  . CAP (community acquired pneumonia) 10/2015  . Cataract   . Chest pain 09/11/2015  . COPD (chronic obstructive pulmonary disease) ([place])   . Degenerative disk disease 10/2005   Lumbar, anterolithesis, L4-5,5-S1  . DEVELOPMENTAL READING DISORDER UNSPECIFIED 07/18/2007   Qualifier: Diagnosis of  By: Walker Kehr MD, Patrick Jupiter    . Diabetes mellitus without complication ([place])   . Estrogen deficiency 09/22/2015  . GERD (gastroesophageal reflux  disease)   . Headache 03/11/2016  . Hilar mass    CT Chest 10/2015 IMPRESSION: No demonstrable pulmonary embolus. There is infiltrate consistent with pneumonia in portions of the right lower lobe. There is patchy atelectasis in the left lower lobe. There is no appreciable adenopathy. The prominence in the right hilum noted previously is apparently due to vascular prominence as opposed to mass or adenopathy. There are foci of aortic atheroscleroti  . Hyperlipidemia   . Hypertension   . 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 Noncompliance with medications 09/11/2015  . Osteoporosis   . TOBACCO USER 03/04/2009   Snuff dipper       Family History  Problem Relation Age of Onset  . Diabetes Father   . Hypertension Mother   . Diabetes Mother   . Lung cancer Mother   . Hypertension Sister   . Asthma Son   .  Colon polyps Neg Hx   . Esophageal cancer Neg Hx   . Stomach cancer Neg Hx   . Rectal cancer Neg Hx      Past Surgical History:  Procedure Laterality Date  . CESAREAN SECTION  1975  . CHOLECYSTECTOMY  1975   states gallstones removed  . TUBAL LIGATION  1975    Social History   Socioeconomic History  . Marital status: Single    Spouse name: Not on file  . Number of children: 1  . Years of education: 71  . Highest education level: Not on file  Occupational History  . Not on file  Tobacco Use  . Smoking status: Former Smoker    Packs/day: 2.00    Years: 27.00    Pack years: 54.00    Types: Cigarettes    Quit date: 09/24/1988    Years since quitting: 30.7  . Smokeless tobacco: Former Systems developer    Types: Snuff    Quit date: 07/05/2018  Substance and Sexual Activity  . Alcohol use: Yes    Alcohol/week: 2.0 standard drinks    Types: 2 Standard drinks or equivalent per week  . Drug use: No  . Sexual activity: Not Currently    Birth control/protection: Surgical  Other Topics Concern  . Not on file  Social History Narrative   Lives alone, retired Geophysicist/field seismologist   Son, Manvi Mannis in  [place] Strain:   . Difficulty of Paying Living Expenses: Not on file  Food Insecurity:   . Worried About Charity fundraiser in the Last Year: Not on file  . Ran Out of Food in the Last Year: Not on file  Transportation Needs:   . Lack of Transportation (Medical): Not on file  . Lack of Transportation (Non-Medical): Not on file  Physical Activity:   . Days of Exercise per Week: Not on file  . Minutes of Exercise per Session: Not on file  Stress:   . Feeling of Stress : Not on file  Social Connections:   . Frequency of Communication with Friends and Family: Not on file  . Frequency of Social Gatherings with Friends and Family: Not on file  . Attends Religious Services: Not on file  . Active Member of Clubs or Organizations: Not on file  . Attends Archivist Meetings: Not on file  . Marital Status: Not on file  Intimate Partner Violence:   . Fear of Current or Ex-Partner: Not on file  . Emotionally Abused: Not on file  . Physically Abused: Not on file  . Sexually Abused: Not on file     No Known Allergies   CBC    Component Value Date/Time   WBC 8.4 03/07/2019 0939   WBC 13.0 (H) 10/29/2015 0756   RBC 4.21 03/07/2019 0939   RBC 4.23 10/29/2015 0756   HGB 12.7 03/07/2019 0939   HCT 37.1 03/07/2019 0939   PLT 257 03/07/2019 0939   MCV 88 03/07/2019 0939   MCH 30.2 03/07/2019 0939   MCH 29.3 10/29/2015 0756   MCHC 34.2 03/07/2019 0939   MCHC 34.0 10/29/2015 0756   RDW 12.7 03/07/2019 0939   LYMPHSABS 1.8 10/26/2015 1100   MONOABS 1.9 (H) 10/26/2015 1100   EOSABS 0.0 10/26/2015 1100   BASOSABS 0.0 10/26/2015 1100    Pulmonary Functions Testing Results: No flowsheet data found.  Outpatient Medications Prior to Visit  Medication Sig Dispense Refill  . acetaminophen (TYLENOL) 325 MG tablet Take 650 mg by mouth every 4 (four) hours as needed for mild pain.    [name] albuterol (VENTOLIN HFA)  108 (90 Base) MCG/ACT inhaler Inhale 2 puffs into the lungs every 6 (six) hours as needed for wheezing. 18 g 6  . alendronate (FOSAMAX) 70 MG tablet TAKE 1 TABLET BY MOUTH EVERY 7 DAYS WITH A FULL GLASS OF WATER AND ON AN EMPTY STOMACH 12 tablet 1  . aspirin 81 MG EC tablet Take 81 mg by mouth daily.     [name] atorvastatin (LIPITOR) 20 MG tablet TAKE 1 TABLET(20 MG) BY MOUTH DAILY AT 6 PM 90 tablet 1  . calcium gluconate 500 MG tablet Take 1 tablet (500 mg total) by mouth 2 (two) times daily. 60 tablet prn  . lisinopril (ZESTRIL) 10 MG tablet TAKE 1 TABLET(10 MG) BY MOUTH DAILY 90 tablet 1  . metFORMIN (GLUCOPHAGE-XR) 500 MG 24 hr tablet Take 1 tablet (500 mg total) by mouth daily with breakfast. 90 tablet 1  . omeprazole (PRILOSEC) 20 MG capsule TAKE ONE CAPSULE BY MOUTH DAILY AS NEEDED, PROLONGED USE MAY CAUSE RENAL IMPARIMENT 90 capsule 1  . SPIRIVA HANDIHALER 18 MCG inhalation capsule PLACE 1 CAPSULE( 18 MCG TOTAL) INTO INHALER AND INHALE EVERY MORNING 30 capsule 12  . Vitamin D, Ergocalciferol, (DRISDOL) 1.25 MG (50000 UNIT) CAPS capsule Take 1 capsule (50,000 Units total) by mouth every 7 (seven) days. 4 capsule 1   Facility-Administered Medications Prior to Visit  Medication Dose Route Frequency Provider Last Rate Last Admin  . 0.9 %  sodium chloride infusion  500 mL Intravenous Once Nelida Meuse III, MD      . 0.9 %  sodium chloride infusion  500 mL Intravenous Once Loletha Carrow Kirke Corin, MD

## 2019-06-21 LAB — IGE: IgE (Immunoglobulin E), Serum: 35 kU/L (ref <=114)

## 2019-06-28 ENCOUNTER — Ambulatory Visit: Payer: Medicare Other

## 2019-06-28 ENCOUNTER — Other Ambulatory Visit: Payer: Self-pay

## 2019-06-28 ENCOUNTER — Other Ambulatory Visit: Payer: Self-pay | Admitting: Family Medicine

## 2019-06-28 ENCOUNTER — Telehealth: Payer: Medicare Other

## 2019-06-28 DIAGNOSIS — R0602 Shortness of breath: Secondary | ICD-10-CM

## 2019-06-28 DIAGNOSIS — J449 Chronic obstructive pulmonary disease, unspecified: Secondary | ICD-10-CM

## 2019-06-28 DIAGNOSIS — G8929 Other chronic pain: Secondary | ICD-10-CM

## 2019-06-28 DIAGNOSIS — M25562 Pain in left knee: Secondary | ICD-10-CM

## 2019-06-28 MED ORDER — ONETOUCH VERIO W/DEVICE KIT: PACK | 0 refills | Status: DC

## 2019-06-28 MED ORDER — ONETOUCH DELICA LANCING DEV MISC: 0 refills | Status: DC

## 2019-06-28 MED ORDER — ONETOUCH DELICA LANCETS 33G MISC: 12 refills | Status: DC

## 2019-06-28 MED ORDER — ONETOUCH VERIO VI STRP: ORAL_STRIP | 12 refills | Status: DC

## 2019-06-28 NOTE — Chronic Care Management (AMB) (Signed)
Care Management   Initial Visit Note  06/28/2019 Name: Desiree Parker MRN: [id number] DOB: Apr 29, 1945  Assessment: Desiree Parker is a 74 y.o. year old female who sees Desiree Feil, MD for primary care. The care management team was consulted for assistance with care management and care coordination needs related to CHF COPD  for  Home health services   Review of patient status, including review of consultants reports, relevant laboratory and other test results, and collaboration with appropriate care team members and the patient's provider was performed as part of comprehensive patient evaluation and provision of care management services.    SDOH (Social Determinants of Health) assessments performed: No See Care Plan activities for detailed interventions related to [facility])     Outpatient Encounter Medications as of 06/28/2019  Medication Sig Note  . acetaminophen (TYLENOL) 325 MG tablet Take 650 mg by mouth every 4 (four) hours as needed for mild pain.   [name] albuterol (VENTOLIN HFA) 108 (90 Base) MCG/ACT inhaler Inhale 2 puffs into the lungs every 6 (six) hours as needed for wheezing.   [name] alendronate (FOSAMAX) 70 MG tablet TAKE 1 TABLET BY MOUTH EVERY 7 DAYS WITH A FULL GLASS OF WATER AND ON AN EMPTY STOMACH   . aspirin 81 MG EC tablet Take 81 mg by mouth daily.    [name] atorvastatin (LIPITOR) 20 MG tablet TAKE 1 TABLET(20 MG) BY MOUTH DAILY AT 6 PM   . calcium gluconate 500 MG tablet Take 1 tablet (500 mg total) by mouth 2 (two) times daily.   . fluticasone furoate-vilanterol (BREO ELLIPTA) 100-25 MCG/INH AEPB Inhale 1 puff into the lungs daily.   . fluticasone furoate-vilanterol (BREO ELLIPTA) 100-25 MCG/INH AEPB Inhale 1 puff into the lungs daily.   [name] lisinopril (ZESTRIL) 10 MG tablet TAKE 1 TABLET(10 MG) BY MOUTH DAILY   . metFORMIN (GLUCOPHAGE-XR) 500 MG 24 hr tablet Take 1 tablet (500 mg total) by mouth daily with breakfast.   . omeprazole (PRILOSEC) 20 MG capsule TAKE ONE CAPSULE BY  MOUTH DAILY AS NEEDED, PROLONGED USE MAY CAUSE RENAL IMPARIMENT 02/26/2019: Takes  1 daily  . SPIRIVA HANDIHALER 18 MCG inhalation capsule PLACE 1 CAPSULE( 18 MCG TOTAL) INTO INHALER AND INHALE EVERY MORNING   . Vitamin D, Ergocalciferol, (DRISDOL) 1.25 MG (50000 UNIT) CAPS capsule Take 1 capsule (50,000 Units total) by mouth every 7 (seven) days.    Facility-Administered Encounter Medications as of 06/28/2019  Medication  . 0.9 %  sodium chloride infusion  . 0.9 %  sodium chloride infusion    Goals Addressed            This Visit's Progress   . I would like Home health services (pt-stated)       CARE PLAN ENTRY (see longtitudinal plan of care for additional care plan information)  Current Barriers:  [name] Knowledge Deficits related to navigation of  community services for Home health and home aides  Nurse Case Manager Clinical Goal(s):  [name] Over the next 30 days, patient/son will verbalize understanding of plan   Interventions:  . Evaluation of current treatment plan and patient's adherence to plan as established by provider. Desiree Parker patient/son that I will initiate plan for [facility] and will wait for cofirmation of medicaid insurance for [employer] . Provided education to son how process works . Will collaborate with [facility] agency to have a nurse evaluation, PT.and OT, and the son states that his mother has [employer].  I have asked him to  send me a copy of her card if she does I want to see about getting her a PCS working in the home.  She lives alone.  The son and his wife are currently going to the home 3 times/week  to help with cleaning.  They fill her pill box for her and need help. . Discussed plans with patient for ongoing care management follow up and provided patient with direct contact information for care management team . I have sent a message to the PCP for the referral . I will talk with the Social work about [facility] worker once I have confirmed that the patient has  medicaid.  Patient Self Care Activities:  . Patient/son verbalizes understanding of plan  . Attends all scheduled provider appointments . Calls pharmacy for medication refills . Calls provider office for new concerns or questions  Initial goal documentation         Follow up plan:  The care management team will reach out to the patient again over the next 10 days.  The patient has been provided with contact information for the care management team and has been advised to call with any health related questions or concerns.   Desiree Parker was given information about Care Management services today including:  1. Care Management services include personalized support from designated clinical staff supervised by a physician, including individualized plan of care and coordination with other care providers 2. 24/7 contact phone numbers for assistance for urgent and routine care needs. 3. The patient may stop Care Management services at any time (effective at the end of the month) by phone call to the office staff.  Patient agreed to services and verbal consent obtained.  Desiree Arms RN, BSN, [facility] Care Management Coordinator [place] Phone: [phone] Fax: [phone]

## 2019-06-29 ENCOUNTER — Ambulatory Visit: Payer: Self-pay

## 2019-06-29 NOTE — Patient Instructions (Signed)
Visit Information  Goals Addressed            This Visit's Progress   . I would like Home health services (pt-stated)       CARE PLAN ENTRY (see longtitudinal plan of care for additional care plan information)  Current Barriers:  [name] Knowledge Deficits related to navigation of  community services for Home health and home aides  Nurse Case Manager Clinical Goal(s):  [name] Over the next 30 days, patient/son will verbalize understanding of plan   Interventions:  . Evaluation of current treatment plan and patient's adherence to plan as established by provider. [name] patient/son that I will initiate plan for [facility] and will wait for cofirmation of medicaid insurance for [employer] . Provided education to son how process works . Will collaborate with [facility] agency to have a nurse evaluation, PT.and OT, and the son states that his mother has [employer].  I have asked him to send me a copy of her card if she does I want to see about getting her a PCS working in the home.  She lives alone.  The son and his wife are currently going to the home 3 times/week  to help with cleaning.  They fill her pill box for her and need help. . Discussed plans with patient for ongoing care management follow up and provided patient with direct contact information for care management team . I have sent a message to the PCP for the referral . I will talk with the Social work about [facility] worker once I have confirmed that the patient has medicaid. . 06/29/19 . RN Case Manager called [facility] [phone] and referral faxed to [phone].  I will call next week to follow up on the information.  Patient Self Care Activities:  . Patient/son verbalizes understanding of plan  . Attends all scheduled provider appointments . Calls pharmacy for medication refills . Calls provider office for new concerns or questions  Initial goal documentation        Ms. Maggiacomo was given information about Care Management services today  including:  1. Care Management services include personalized support from designated clinical staff supervised by her physician, including individualized plan of care and coordination with other care providers 2. 24/7 contact phone numbers for assistance for urgent and routine care needs. 3. The patient may stop CCM services at any time (effective at the end of the month) by phone call to the office staff.  Patient agreed to services and verbal consent obtained.   The patient verbalized understanding of instructions provided today and declined a print copy of patient instruction materials.   The patient has been provided with contact information for the care management team and has been advised to call with any health related questions or concerns.   [name] RN, BSN, [facility] Care Management Coordinator [place] Phone: [phone] Fax: [phone]

## 2019-06-29 NOTE — Chronic Care Management (AMB) (Signed)
  Care Management   Follow Up Note   06/29/2019 Name: Desiree Parker MRN: [id number] DOB: Jul 20, 1945  Referred by: Desiree Feil, MD Reason for referral : Care Coordination (Care Management [place])   [name] is a 74 y.o. year old female who is a primary care patient of Desiree Feil, MD. The care management team was consulted for assistance with care management and care coordination needs.    Review of patient status, including review of consultants reports, relevant laboratory and other test results, and collaboration with appropriate care team members and the patient's provider was performed as part of comprehensive patient evaluation and provision of chronic care management services.    SDOH (Social Determinants of Health) assessments performed: No See Care Plan activities for detailed interventions related to Desiree Parker)     Advanced Directives: See Care Plan and Vyn[employer] application for related entries.   Goals Addressed            This Visit's Progress   . I would like Home health services (pt-stated)       CARE PLAN ENTRY (see longtitudinal plan of care for additional care plan information)  Current Barriers:  [name] Knowledge Deficits related to navigation of  community services for Home health and home aides  Nurse Case Manager Clinical Goal(s):  [name] Over the next 30 days, patient/son will verbalize understanding of plan   Interventions:  . Evaluation of current treatment plan and patient's adherence to plan as established by provider. [name] patient/son that I will initiate plan for [facility] and will wait for cofirmation of medicaid insurance for [employer] . Provided education to son how process works . Will collaborate with [facility] Desiree Parker to have a nurse evaluation, PT.and OT, and the son states that his mother has [employer].  I have asked him to send me a copy of her card if she does I want to see about getting her a PCS working in the home.  She lives  alone.  The son and his wife are currently going to the home 3 times/week  to help with cleaning.  They fill her pill box for her and need help. . Discussed plans with patient for ongoing care management follow up and provided patient with direct contact information for care management team . I have sent a message to the PCP for the referral . I will talk with the Social work about [facility] worker once I have confirmed that the patient has medicaid. . 06/29/19 . RN Case Manager called [facility] [phone] and referral faxed to [phone].  I will call next week to follow up on the information.  Patient Self Care Activities:  . Patient/son verbalizes understanding of plan  . Attends all scheduled provider appointments . Calls pharmacy for medication refills . Calls provider office for new concerns or questions  Initial goal documentation         The patient has been provided with contact information for the care management team and has been advised to call with any health related questions or concerns.   SIGNATURE

## 2019-07-04 ENCOUNTER — Other Ambulatory Visit: Payer: Self-pay

## 2019-07-04 ENCOUNTER — Ambulatory Visit: Payer: Medicare Other

## 2019-07-04 NOTE — Chronic Care Management (AMB) (Signed)
Care Management   Follow Up Note   07/04/2019 Name: Desiree Parker MRN: [id number] DOB: 1945/08/14  Referred by: [name], MD Reason for referral : Care Coordination (Care Management [place])   Desiree Parker is a 74 y.o. year old female who is a primary care patient of [name], MD. The care management team was consulted for assistance with care management and care coordination needs.    Review of patient status, including review of consultants reports, relevant laboratory and other test results, and collaboration with appropriate care team members and the patient's provider was performed as part of comprehensive patient evaluation and provision of chronic care management services.    SDOH (Social Determinants of Health) assessments performed: No See Care Plan activities for detailed interventions related to [facility])     Advanced Directives: See Care Plan and Vynca application for related entries.   Goals Addressed            This Visit's Progress   . I would like Home health services (pt-stated)       CARE PLAN ENTRY (see longtitudinal plan of care for additional care plan information)  Current Barriers:  [name] Knowledge Deficits related to navigation of  community services for Home health and home aides  Nurse Case Manager Clinical Goal(s):  [name] Over the next 30 days, patient/son will verbalize understanding of plan   Interventions:  . Evaluation of current treatment plan and patient's adherence to plan as established by provider. [name] patient/son that I will initiate plan for [facility] and will wait for cofirmation of medicaid insurance for [employer] . Provided education to son how process works . Will collaborate with [facility] agency to have a nurse evaluation, PT.and OT, and the son states that his mother has [employer].  I have asked him to send me a copy of her card if she does I want to see about getting her a PCS working in the home.  She lives  alone.  The son and his wife are currently going to the home 3 times/week  to help with cleaning.  They fill her pill box for her and need help. . Discussed plans with patient for ongoing care management follow up and provided patient with direct contact information for care management team . I have sent a message to the PCP for the referral . I will talk with the Social work about [facility] worker once I have confirmed that the patient has medicaid. . 06/29/19 . RN Case Manager called [facility] [phone] and referral faxed to [phone].  I will call next week to follow up on the information. . 07/04/19 . Spoke with Dalbert Mayotte [facility] called and spoke with his mother and stated that they will callback and try to come out on either Thursday or Friday. . I told Mr. Pande that had asked them to call ask speak with him as the main contact. . I called [facility] today and spoke with [place] and asked them to call him as the contact for his mother.She stated that she would let the contacting personnel know. . Mr Guerino was notified that the process for the [facility] worker was started as well.   Patient Self Care Activities:  . Patient/son verbalizes understanding of plan  . Attends all scheduled provider appointments . Calls pharmacy for medication refills . Calls provider office for new concerns or questions  Please see past updates related to this goal by clicking on the "Past Updates"  button in the selected goal          The care management team will reach out to the patient again over the next 14 days.  The patient has been provided with contact information for the care management team and has been advised to call with any health related questions or concerns.   [name] RN, BSN, [facility] Care Management Coordinator [place] Phone: [phone] Fax: [phone]

## 2019-07-04 NOTE — Patient Instructions (Signed)
Visit Information  Goals Addressed            This Visit's Progress   . I would like Home health services (pt-stated)       CARE PLAN ENTRY (see longtitudinal plan of care for additional care plan information)  Current Barriers:  Desiree Parker Knowledge Deficits related to navigation of  community services for Home health and home aides  Nurse Case Manager Clinical Goal(s):  Desiree Parker Over the next 30 days, patient/son will verbalize understanding of plan   Interventions:  . Evaluation of current treatment plan and patient's adherence to plan as established by provider. [name] patient/son that I will initiate plan for [facility] and will wait for cofirmation of medicaid insurance for [employer] . Provided education to son how process works . Will collaborate with [facility] agency to have a nurse evaluation, PT.and OT, and the son states that his mother has [employer].  I have asked him to send me a copy of her card if she does I want to see about getting her a PCS working in the home.  She lives alone.  The son and his wife are currently going to the home 3 times/week  to help with cleaning.  They fill her pill box for her and need help. . Discussed plans with patient for ongoing care management follow up and provided patient with direct contact information for care management team . I have sent a message to the PCP for the referral . I will talk with the Social work about [facility] worker once I have confirmed that the patient has medicaid. . 06/29/19 . RN Case Manager called [facility] [phone] and referral faxed to [phone].  I will call next week to follow up on the information. . 07/04/19 . Spoke with Dalbert Mayotte [facility] called and spoke with his mother and stated that they will callback and try to come out on either Thursday or Friday. . I told Mr. Loader that had asked them to call ask speak with him as the main contact. . I called [facility] today and spoke with [place] and asked them to call him as the  contact for his mother.She stated that she would let the contacting personnel know. . Mr Nier was notified that the process for the [facility] worker was started as well.   Patient Self Care Activities:  . Patient/son verbalizes understanding of plan  . Attends all scheduled provider appointments . Calls pharmacy for medication refills . Calls provider office for new concerns or questions  Please see past updates related to this goal by clicking on the "Past Updates" button in the selected goal         [name] was given information about Care Management services today including:  1. Care Management services include personalized support from designated clinical staff supervised by her physician, including individualized plan of care and coordination with other care providers 2. 24/7 contact phone numbers for assistance for urgent and routine care needs. 3. The patient may stop CCM services at any time (effective at the end of the month) by phone call to the office staff.  Patient agreed to services and verbal consent obtained.   The patient verbalized understanding of instructions provided today and declined a print copy of patient instruction materials.   The care management team will reach out to the patient again over the next 14 days.  The patient has been provided with contact information for the care management team and has been advised to  call with any health related questions or concerns.   [name] RN, BSN, [facility] Care Management Coordinator [place] Phone: [phone] Fax: [phone]

## 2019-07-05 ENCOUNTER — Ambulatory Visit: Payer: Self-pay | Admitting: Licensed Clinical Social Worker

## 2019-07-05 ENCOUNTER — Telehealth: Payer: Self-pay

## 2019-07-05 DIAGNOSIS — G8929 Other chronic pain: Secondary | ICD-10-CM | POA: Diagnosis not present

## 2019-07-05 DIAGNOSIS — I5032 Chronic diastolic (congestive) heart failure: Secondary | ICD-10-CM | POA: Diagnosis not present

## 2019-07-05 DIAGNOSIS — M25562 Pain in left knee: Secondary | ICD-10-CM | POA: Diagnosis not present

## 2019-07-05 DIAGNOSIS — M6281 Muscle weakness (generalized): Secondary | ICD-10-CM | POA: Diagnosis not present

## 2019-07-05 DIAGNOSIS — I110 Hypertensive heart disease with heart failure: Secondary | ICD-10-CM | POA: Diagnosis not present

## 2019-07-05 DIAGNOSIS — Z7982 Long term (current) use of aspirin: Secondary | ICD-10-CM | POA: Diagnosis not present

## 2019-07-05 DIAGNOSIS — J449 Chronic obstructive pulmonary disease, unspecified: Secondary | ICD-10-CM | POA: Diagnosis not present

## 2019-07-05 DIAGNOSIS — E1169 Type 2 diabetes mellitus with other specified complication: Secondary | ICD-10-CM | POA: Diagnosis not present

## 2019-07-05 DIAGNOSIS — Z741 Need for assistance with personal care: Secondary | ICD-10-CM

## 2019-07-05 DIAGNOSIS — M25561 Pain in right knee: Secondary | ICD-10-CM | POA: Diagnosis not present

## 2019-07-05 DIAGNOSIS — E559 Vitamin D deficiency, unspecified: Secondary | ICD-10-CM | POA: Diagnosis not present

## 2019-07-05 DIAGNOSIS — E785 Hyperlipidemia, unspecified: Secondary | ICD-10-CM | POA: Diagnosis not present

## 2019-07-05 DIAGNOSIS — Z7984 Long term (current) use of oral hypoglycemic drugs: Secondary | ICD-10-CM | POA: Diagnosis not present

## 2019-07-05 NOTE — Telephone Encounter (Signed)
Verbal order approved. Please call to give order.

## 2019-07-05 NOTE — Chronic Care Management (AMB) (Signed)
Care Management   Clinical Social Work Follow Up   07/05/2019 Name: Desiree Parker MRN: [id number] DOB: [birth year]  Referred by: Desiree Feil, MD  Reason for referral : Care Coordination (PCS )  Desiree Parker is a 74 y.o. year old female who is a primary care patient of [place], Phill Myron, MD.  Reason for follow-up: Phone encounter with patient's son today to provide update and review personal care service process  Review of patient status, including review of consultants reports, relevant laboratory and other test results, and collaboration with appropriate care team members and the patient's provider was performed as part of comprehensive patient evaluation and provision of care management services.    SDOH (Social Determinants of Health) assessments performed: No   Goals Addressed            This Visit's Progress   . Personal Care Services   On track    CARE PLAN ENTRY (see longitudinal plan of care for additional care plan information) Current Barriers:   . Patient needs Support, Education, and Care Coordination to resolve unmet Personal Care needs .  PCS Referral completed and signed by PCP Clinical Social Work Goal(s):  Desiree Parker Over the next 30 to 45 days, patient will have personal care needs met as evident by having PCS Aide in the home assisting with needs. Interventions provided by LCSW : . Spoke with patient's son to review PCS process and selecting a PCS provider . Collaborate with primary care provider ref completing PCS referral  . PCS referral faxed to [employer] at [phone]  . LCSW will collaborate with [facility] to verify application is received and processed.  Patient Self Care Activities & Deficits:  . Patient is unable to perform ADLs independently without assistance  . Family/support system will assist patient with meeting needs until [facility] is approved . Return calls from [facility] to set up initial PCS assessment once  application is approved . Call [facility] with questions [phone] or [phone]  Please see past updates related to this goal by clicking on the "Past Updates" button in the selected goal         Outpatient Encounter Medications as of 07/05/2019  Medication Sig Note  . acetaminophen (TYLENOL) 325 MG tablet Take 650 mg by mouth every 4 (four) hours as needed for mild pain.   Desiree Parker albuterol (VENTOLIN HFA) 108 (90 Base) MCG/ACT inhaler Inhale 2 puffs into the lungs every 6 (six) hours as needed for wheezing.   Desiree Parker alendronate (FOSAMAX) 70 MG tablet TAKE 1 TABLET BY MOUTH EVERY 7 DAYS WITH A FULL GLASS OF WATER AND ON AN EMPTY STOMACH   . aspirin 81 MG EC tablet Take 81 mg by mouth daily.    Desiree Parker atorvastatin (LIPITOR) 20 MG tablet TAKE 1 TABLET(20 MG) BY MOUTH DAILY AT 6 PM   . Blood Glucose Monitoring Suppl (ONETOUCH VERIO) w/Device KIT To check blood sugar once daily. E11.9   . calcium gluconate 500 MG tablet Take 1 tablet (500 mg total) by mouth 2 (two) times daily.   . fluticasone furoate-vilanterol (BREO ELLIPTA) 100-25 MCG/INH AEPB Inhale 1 puff into the lungs daily.   . fluticasone furoate-vilanterol (BREO ELLIPTA) 100-25 MCG/INH AEPB Inhale 1 puff into the lungs daily.   Desiree Parker glucose blood (ONETOUCH VERIO) test strip To check blood sugar once daily. E11.9   . Lancet Devices (ONE TOUCH DELICA LANCING DEV) MISC To check blood sugar once daily. E11.9   .  lisinopril (ZESTRIL) 10 MG tablet TAKE 1 TABLET(10 MG) BY MOUTH DAILY   . metFORMIN (GLUCOPHAGE-XR) 500 MG 24 hr tablet Take 1 tablet (500 mg total) by mouth daily with breakfast.   . omeprazole (PRILOSEC) 20 MG capsule TAKE ONE CAPSULE BY MOUTH DAILY AS NEEDED, PROLONGED USE MAY CAUSE RENAL IMPARIMENT 02/26/2019: Takes  1 daily  . OneTouch Delica Lancets 14A MISC Please dispense 1 box. To check blood sugar once daily. E11.9   . SPIRIVA HANDIHALER 18 MCG inhalation capsule PLACE 1 CAPSULE( 18 MCG TOTAL) INTO INHALER AND INHALE EVERY  MORNING   . Vitamin D, Ergocalciferol, (DRISDOL) 1.25 MG (50000 UNIT) CAPS capsule Take 1 capsule (50,000 Units total) by mouth every 7 (seven) days.    Facility-Administered Encounter Medications as of 07/05/2019  Medication  . 0.9 %  sodium chloride infusion  . 0.9 %  sodium chloride infusion   Plan: LCSW will continue to coordinate care with CCM RN for patient's needs  Desiree Parker, [place] / [place]   [phone] 11:11 AM

## 2019-07-05 NOTE — Telephone Encounter (Signed)
Called and provided verbal orders to April.   Talbot Grumbling, RN

## 2019-07-05 NOTE — Chronic Care Management (AMB) (Signed)
   Social Work  Care Management Collaboration 07/05/2019 Name: Desiree Parker MRN: [id number] DOB: November 28, 1945  Desiree Parker is a 74 y.o. year old female who sees [name], MD for primary care.  LCSW was consulted by CCM RN care manager to assistance patient with  Level of Care Concerns for Personal Care Service. Patient was not interviewed or contacted during this encounter however LCSW collaborated with CCM RN who has spoken with patient and son about this need.  Intervention:  Review of patient status, including review of consultants reports, relevant laboratory and other test results, and collaboration with appropriate care team members and the patient's provider was performed as part of comprehensive patient evaluation and provision of chronic care management services.   Goals Addressed            This Visit's Progress   . Personal Care Services       CARE PLAN ENTRY (see longitudinal plan of care for additional care plan information) Current Barriers:   . Patient needs Support, Education, and Care Coordination to resolve unmet Personal Care needs . Patient needs PCP to complete PCS Referral Clinical Social Work Goal(s):  [name] Over the next 30 to 45 days, patient will have personal care needs met as evident by having PCS Aide in the home assisting with needs. Interventions provided by LCSW : . Collaborate with primary care provider ref completing PCS referral ( Referral placed in mailbox for PCP to complete and sign) . PCS referral will be faxed to [employer] at [phone] once completed and signed by PCP . LCSW will collaborate with [facility] to verify application is received and processed.  Patient Self Care Activities & Deficits:  . Call provider office for new concerns or questions . Patient is unable to perform ADLs independently without assistance  . Family/support system will assist patient with meeting needs until [facility] is  approved . Return calls from [facility] to set up initial PCS assessment once application is approved . Call [facility] with questions [phone] or [phone]  Initial goal documentation    Plan: Once PCS form is complete LCSW will fax form and reach out to patient and family to review and explain referral process  Casimer Lanius, [place] / [place]   [phone] 9:41 AM

## 2019-07-05 NOTE — Patient Instructions (Signed)
Licensed Clinical Social Worker Visit Information Ms. Witter  it was nice speaking with your son today. Please call me directly if you have questions [phone] Goals we discussed today:  Goals Addressed            This Visit's Progress   . Personal Care Services   On track    CARE PLAN ENTRY (see longitudinal plan of care for additional care plan information) Current Barriers:   . Patient needs Support, Education, and Care Coordination to resolve unmet Personal Care needs .  PCS Referral completed and signed by PCP Clinical Social Work Goal(s):  [name] Over the next 30 to 45 days, patient will have personal care needs met as evident by having PCS Aide in the home assisting with needs. Interventions provided by LCSW : . Spoke with patient's son to review PCS process and selecting a PCS provider . Collaborate with primary care provider ref completing PCS referral  . PCS referral faxed to [employer] at [phone]  . LCSW will collaborate with [facility] to verify application is received and processed.  Patient Self Care Activities & Deficits:  . Patient is unable to perform ADLs independently without assistance  . Family/support system will assist patient with meeting needs until [facility] is approved . Return calls from [facility] to set up initial PCS assessment once application is approved . Call [facility] with questions [phone] or [phone]  Please see past updates related to this goal by clicking on the "Past Updates" button in the selected goal        Materials provided: Yes: List of PCS providers Ms. Amparo received Care Management services today:  1. Care Management services include personalized support from designated clinical staff supervised by her physician, including individualized plan of care and coordination with other care providers 2. 24/7 contact [phone] for assistance for urgent and routine care needs. 3. Care  Management are voluntary services and be declined at any time by calling the office.  Patient's son verbally agreed to assistance and services provided by embedded care coordination/care management team today.   Patient verbalizes understanding of instructions provided today.  Follow up plan: no follow up scheduled   [name], LCSW

## 2019-07-05 NOTE — Telephone Encounter (Signed)
April from [place] for [place] verbal orders as follows:  1 time(s) weekly for 2 week(s), then 2 time(s) weekly for 2 week(s), then once PRN  You can leave verbal orders on confidential voicemail at [place], RN

## 2019-07-09 DIAGNOSIS — M25561 Pain in right knee: Secondary | ICD-10-CM | POA: Diagnosis not present

## 2019-07-09 DIAGNOSIS — Z7982 Long term (current) use of aspirin: Secondary | ICD-10-CM | POA: Diagnosis not present

## 2019-07-09 DIAGNOSIS — M25562 Pain in left knee: Secondary | ICD-10-CM | POA: Diagnosis not present

## 2019-07-09 DIAGNOSIS — I5032 Chronic diastolic (congestive) heart failure: Secondary | ICD-10-CM | POA: Diagnosis not present

## 2019-07-09 DIAGNOSIS — J449 Chronic obstructive pulmonary disease, unspecified: Secondary | ICD-10-CM | POA: Diagnosis not present

## 2019-07-09 DIAGNOSIS — I110 Hypertensive heart disease with heart failure: Secondary | ICD-10-CM | POA: Diagnosis not present

## 2019-07-09 DIAGNOSIS — E785 Hyperlipidemia, unspecified: Secondary | ICD-10-CM | POA: Diagnosis not present

## 2019-07-09 DIAGNOSIS — E1169 Type 2 diabetes mellitus with other specified complication: Secondary | ICD-10-CM | POA: Diagnosis not present

## 2019-07-09 DIAGNOSIS — G8929 Other chronic pain: Secondary | ICD-10-CM | POA: Diagnosis not present

## 2019-07-09 DIAGNOSIS — M6281 Muscle weakness (generalized): Secondary | ICD-10-CM | POA: Diagnosis not present

## 2019-07-09 DIAGNOSIS — E559 Vitamin D deficiency, unspecified: Secondary | ICD-10-CM | POA: Diagnosis not present

## 2019-07-09 DIAGNOSIS — Z7984 Long term (current) use of oral hypoglycemic drugs: Secondary | ICD-10-CM | POA: Diagnosis not present

## 2019-07-10 ENCOUNTER — Ambulatory Visit: Payer: Self-pay | Admitting: Licensed Clinical Social Worker

## 2019-07-10 DIAGNOSIS — Z741 Need for assistance with personal care: Secondary | ICD-10-CM

## 2019-07-10 NOTE — Patient Instructions (Signed)
Licensed Clinical Social Worker Visit Information Desiree Parker  it was nice speaking with you. Please call me directly if you have questions [phone] Goals we discussed today:  Goals Addressed            This Visit's Progress   . Personal Care Services   Not on track    [place] (see longitudinal plan of care for additional care plan information) Current Barriers & progress:   . Patient needs Support, Education, and Care Coordination to resolve unmet Personal Care needs .  PCS Referral completed and signed by PCP . Patient is not eligible for PCS Clinical Social Work Goal(s):  Desiree Parker Kitchen Over the next 30 to 45 days, patient will have personal care needs met as evident by son exploring other options to meet patient's PCS needs. Interventions provided by LCSW : . [facility], informed patient does not have full Medicaid and does not qualify for PCS . Spoke with patient's son to provide and update and provide additional alternative options to [name]       ( [employer], [employer]) . Son understands these options have wait list and will require that he calls to start the process  Patient Self Care Activities & Deficits:  . Patient is unable to perform ADLs independently without assistance  . Family/support system will assist patient with meeting needs until [name] is approved . Son will call resources provided  Please see past updates related to this goal by clicking on the "Past Updates" button in the selected goal       Materials provided: Verbal education about non-medicaid PCS alternatives provided by phone [name] received Care Management services today:  1. Care Management services include personalized support from designated clinical staff supervised by her physician, including individualized plan of care and coordination with other care providers 2. 24/7 contact [phone] for assistance for urgent and routine care  needs. 3. Care Management are voluntary services and be declined at any time by calling the office.  Patient's son verbally agreed to assistance and services provided by embedded care coordination/care management team today.   Son verbalizes understanding of instructions provided today.  Follow up plan: CCM RN will continue to follow patient    [name], LCSW

## 2019-07-10 NOTE — Chronic Care Management (AMB) (Signed)
Care Management   Clinical Social Work Follow Up   07/10/2019 Name: Desiree Parker MRN: [id number] DOB: 1945-10-07  Referred by: [name], MD  Reason for referral : Care Coordination (PCS F/U)  Desiree Parker is a 74 y.o. year old female who is a primary care patient of En[place]ola, Phill Myron, MD.  Reason for follow-up: Phone encounter with patient's son today for ongoing assessment to assist with personal care services. LCSW contact [facility] and was informed patient is not eligible for PCS due to not having full Medicaid.  Review of patient status, including review of consultants reports, relevant laboratory and other test results, and collaboration with appropriate care team members and the patient's provider was performed as part of comprehensive patient evaluation and provision of care management services.    SDOH (Social Determinants of Health) assessments performed: No   Goals Addressed            This Visit's Progress   . Personal Care Services   Not on track    [place] (see longitudinal plan of care for additional care plan information) Current Barriers & progress:   . Patient needs Support, Education, and Care Coordination to resolve unmet Personal Care needs .  PCS Referral completed and signed by PCP . Patient is not eligible for PCS Clinical Social Work Goal(s):  [name] Over the next 30 to 45 days, patient will have personal care needs met as evident by son exploring other options to meet patient's PCS needs. Interventions provided by LCSW : . [facility], informed patient does not have full Medicaid and does not qualify for PCS . Spoke with patient's son to provide and update and provide additional alternative options to [name]       ( [facility], [employer]) . Son understands these options have wait list and will require that he calls to start the process  Patient Self Care Activities & Deficits:   . Patient is unable to perform ADLs independently without assistance  . Family/support system will assist patient with meeting needs until [facility] is approved . Son will call resources provided  Please see past updates related to this goal by clicking on the "Past Updates" button in the selected goal        Outpatient Encounter Medications as of 07/10/2019  Medication Sig Note  . acetaminophen (TYLENOL) 325 MG tablet Take 650 mg by mouth every 4 (four) hours as needed for mild pain.   [name] albuterol (VENTOLIN HFA) 108 (90 Base) MCG/ACT inhaler Inhale 2 puffs into the lungs every 6 (six) hours as needed for wheezing.   [name] alendronate (FOSAMAX) 70 MG tablet TAKE 1 TABLET BY MOUTH EVERY 7 DAYS WITH A FULL GLASS OF WATER AND ON AN EMPTY STOMACH   . aspirin 81 MG EC tablet Take 81 mg by mouth daily.    [name] atorvastatin (LIPITOR) 20 MG tablet TAKE 1 TABLET(20 MG) BY MOUTH DAILY AT 6 PM   . Blood Glucose Monitoring Suppl (ONETOUCH VERIO) w/Device KIT To check blood sugar once daily. E11.9   . calcium gluconate 500 MG tablet Take 1 tablet (500 mg total) by mouth 2 (two) times daily.   . fluticasone furoate-vilanterol (BREO ELLIPTA) 100-25 MCG/INH AEPB Inhale 1 puff into the lungs daily.   . fluticasone furoate-vilanterol (BREO ELLIPTA) 100-25 MCG/INH AEPB Inhale 1 puff into the lungs daily.   [name] glucose blood (ONETOUCH VERIO) test strip To check  blood sugar once daily. E11.9   . Lancet Devices (ONE TOUCH DELICA LANCING DEV) MISC To check blood sugar once daily. E11.9   . lisinopril (ZESTRIL) 10 MG tablet TAKE 1 TABLET(10 MG) BY MOUTH DAILY   . metFORMIN (GLUCOPHAGE-XR) 500 MG 24 hr tablet Take 1 tablet (500 mg total) by mouth daily with breakfast.   . omeprazole (PRILOSEC) 20 MG capsule TAKE ONE CAPSULE BY MOUTH DAILY AS NEEDED, PROLONGED USE MAY CAUSE RENAL IMPARIMENT 02/26/2019: Takes  1 daily  . OneTouch Delica Lancets 45W MISC Please dispense 1 box. To check blood sugar once daily. E11.9   . SPIRIVA  HANDIHALER 18 MCG inhalation capsule PLACE 1 CAPSULE( 18 MCG TOTAL) INTO INHALER AND INHALE EVERY MORNING   . Vitamin D, Ergocalciferol, (DRISDOL) 1.25 MG (50000 UNIT) CAPS capsule Take 1 capsule (50,000 Units total) by mouth every 7 (seven) days.    Facility-Administered Encounter Medications as of 07/10/2019  Medication  . 0.9 %  sodium chloride infusion  . 0.9 %  sodium chloride infusion   Plan:   1. CCM RN will continue to follow patient 2. CCM RN will consult with LCSW for additional psychosocial needs  3. No F/U scheduled with LCSW at this time.  Casimer Lanius, LCSW Clinical Social Worker [place] [facility] [place]   [phone] 11:31 AM

## 2019-07-11 DIAGNOSIS — E1169 Type 2 diabetes mellitus with other specified complication: Secondary | ICD-10-CM | POA: Diagnosis not present

## 2019-07-11 DIAGNOSIS — M25562 Pain in left knee: Secondary | ICD-10-CM | POA: Diagnosis not present

## 2019-07-11 DIAGNOSIS — M6281 Muscle weakness (generalized): Secondary | ICD-10-CM | POA: Diagnosis not present

## 2019-07-11 DIAGNOSIS — Z7984 Long term (current) use of oral hypoglycemic drugs: Secondary | ICD-10-CM | POA: Diagnosis not present

## 2019-07-11 DIAGNOSIS — I5032 Chronic diastolic (congestive) heart failure: Secondary | ICD-10-CM | POA: Diagnosis not present

## 2019-07-11 DIAGNOSIS — I110 Hypertensive heart disease with heart failure: Secondary | ICD-10-CM | POA: Diagnosis not present

## 2019-07-11 DIAGNOSIS — E559 Vitamin D deficiency, unspecified: Secondary | ICD-10-CM | POA: Diagnosis not present

## 2019-07-11 DIAGNOSIS — M25561 Pain in right knee: Secondary | ICD-10-CM | POA: Diagnosis not present

## 2019-07-11 DIAGNOSIS — G8929 Other chronic pain: Secondary | ICD-10-CM | POA: Diagnosis not present

## 2019-07-11 DIAGNOSIS — Z7982 Long term (current) use of aspirin: Secondary | ICD-10-CM | POA: Diagnosis not present

## 2019-07-11 DIAGNOSIS — E785 Hyperlipidemia, unspecified: Secondary | ICD-10-CM | POA: Diagnosis not present

## 2019-07-11 DIAGNOSIS — J449 Chronic obstructive pulmonary disease, unspecified: Secondary | ICD-10-CM | POA: Diagnosis not present

## 2019-07-12 ENCOUNTER — Telehealth: Payer: Self-pay | Admitting: *Deleted

## 2019-07-12 DIAGNOSIS — E785 Hyperlipidemia, unspecified: Secondary | ICD-10-CM | POA: Diagnosis not present

## 2019-07-12 DIAGNOSIS — M25562 Pain in left knee: Secondary | ICD-10-CM | POA: Diagnosis not present

## 2019-07-12 DIAGNOSIS — M25561 Pain in right knee: Secondary | ICD-10-CM | POA: Diagnosis not present

## 2019-07-12 DIAGNOSIS — I5032 Chronic diastolic (congestive) heart failure: Secondary | ICD-10-CM | POA: Diagnosis not present

## 2019-07-12 DIAGNOSIS — G8929 Other chronic pain: Secondary | ICD-10-CM | POA: Diagnosis not present

## 2019-07-12 DIAGNOSIS — I110 Hypertensive heart disease with heart failure: Secondary | ICD-10-CM | POA: Diagnosis not present

## 2019-07-12 DIAGNOSIS — E1169 Type 2 diabetes mellitus with other specified complication: Secondary | ICD-10-CM | POA: Diagnosis not present

## 2019-07-12 DIAGNOSIS — J449 Chronic obstructive pulmonary disease, unspecified: Secondary | ICD-10-CM | POA: Diagnosis not present

## 2019-07-12 DIAGNOSIS — Z7984 Long term (current) use of oral hypoglycemic drugs: Secondary | ICD-10-CM | POA: Diagnosis not present

## 2019-07-12 DIAGNOSIS — E559 Vitamin D deficiency, unspecified: Secondary | ICD-10-CM | POA: Diagnosis not present

## 2019-07-12 DIAGNOSIS — M6281 Muscle weakness (generalized): Secondary | ICD-10-CM | POA: Diagnosis not present

## 2019-07-12 DIAGNOSIS — Z7982 Long term (current) use of aspirin: Secondary | ICD-10-CM | POA: Diagnosis not present

## 2019-07-12 NOTE — Telephone Encounter (Signed)
Cathlene from [facility] for PT verbal orders as follows:  1 time(s) weekly for 1 week(s), then 2 time(s) weekly for 3 week(s), then 1 time(s) weekly for 4 week(s)   You can leave verbal orders on confidential voicemail.  Christen Bame, CMA

## 2019-07-12 NOTE — Telephone Encounter (Signed)
Spoke with [employer] [facility] with [facility]. Gave verbal orders. Cathleen also confirmed they were correct and said they would proceed with the PT. Salvatore Marvel, CMA

## 2019-07-12 NOTE — Telephone Encounter (Signed)
Verbal order approved.  Please call to inform Cathlene from [facility].

## 2019-07-13 ENCOUNTER — Ambulatory Visit
Admission: RE | Admit: 2019-07-13 | Discharge: 2019-07-13 | Disposition: A | Discharge status: Home / Self Care | Payer: Medicare Other | Source: Ambulatory Visit | Attending: Family Medicine | Admitting: Family Medicine

## 2019-07-13 ENCOUNTER — Other Ambulatory Visit: Payer: Self-pay

## 2019-07-13 DIAGNOSIS — Z1231 Encounter for screening mammogram for malignant neoplasm of breast: Secondary | ICD-10-CM

## 2019-07-14 ENCOUNTER — Encounter: Payer: Self-pay | Admitting: Family Medicine

## 2019-07-16 DIAGNOSIS — M6281 Muscle weakness (generalized): Secondary | ICD-10-CM | POA: Diagnosis not present

## 2019-07-16 DIAGNOSIS — G8929 Other chronic pain: Secondary | ICD-10-CM | POA: Diagnosis not present

## 2019-07-16 DIAGNOSIS — J449 Chronic obstructive pulmonary disease, unspecified: Secondary | ICD-10-CM | POA: Diagnosis not present

## 2019-07-16 DIAGNOSIS — Z7984 Long term (current) use of oral hypoglycemic drugs: Secondary | ICD-10-CM | POA: Diagnosis not present

## 2019-07-16 DIAGNOSIS — I5032 Chronic diastolic (congestive) heart failure: Secondary | ICD-10-CM | POA: Diagnosis not present

## 2019-07-16 DIAGNOSIS — E1169 Type 2 diabetes mellitus with other specified complication: Secondary | ICD-10-CM | POA: Diagnosis not present

## 2019-07-16 DIAGNOSIS — M25562 Pain in left knee: Secondary | ICD-10-CM | POA: Diagnosis not present

## 2019-07-16 DIAGNOSIS — E785 Hyperlipidemia, unspecified: Secondary | ICD-10-CM | POA: Diagnosis not present

## 2019-07-16 DIAGNOSIS — E559 Vitamin D deficiency, unspecified: Secondary | ICD-10-CM | POA: Diagnosis not present

## 2019-07-16 DIAGNOSIS — Z7982 Long term (current) use of aspirin: Secondary | ICD-10-CM | POA: Diagnosis not present

## 2019-07-16 DIAGNOSIS — I110 Hypertensive heart disease with heart failure: Secondary | ICD-10-CM | POA: Diagnosis not present

## 2019-07-16 DIAGNOSIS — M25561 Pain in right knee: Secondary | ICD-10-CM | POA: Diagnosis not present

## 2019-07-17 ENCOUNTER — Other Ambulatory Visit: Payer: Self-pay

## 2019-07-17 ENCOUNTER — Encounter: Payer: Self-pay | Admitting: Internal Medicine

## 2019-07-17 ENCOUNTER — Ambulatory Visit (INDEPENDENT_AMBULATORY_CARE_PROVIDER_SITE_OTHER): Payer: Medicare Other | Admitting: Internal Medicine

## 2019-07-17 VITALS — BP 128/68 | HR 70 | Temp 97.2°F | Ht 60.0 in | Wt 201.4 lb

## 2019-07-17 DIAGNOSIS — R0609 Other forms of dyspnea: Secondary | ICD-10-CM

## 2019-07-17 DIAGNOSIS — E662 Morbid (severe) obesity with alveolar hypoventilation: Secondary | ICD-10-CM

## 2019-07-17 DIAGNOSIS — R0600 Dyspnea, unspecified: Secondary | ICD-10-CM

## 2019-07-17 DIAGNOSIS — E7800 Pure hypercholesterolemia, unspecified: Secondary | ICD-10-CM

## 2019-07-17 DIAGNOSIS — G4733 Obstructive sleep apnea (adult) (pediatric): Secondary | ICD-10-CM | POA: Diagnosis not present

## 2019-07-17 NOTE — Progress Notes (Signed)
Synopsis: SOB  Assessment & Plan:  DOE- maybe mild benefit to inhalers, she is not very forthcoming on this.  Will continue for now and await PFTs.  Should also check for occult CAD which could explain.  Her TH2 labs were unremarkable as was her chest imaging. Malampatti 4, Daytime fatigue, kyphosis- warrants OSA workup.  Discussed this at length with patient and son who are agreeable to proceed. Cognitive deficit NOS- relies on son for much of her iADLs Enlarged L axillary node- mammogram reassuring  - PSG, she cannot do home sleep study due to memory issues - Cardiac nuclear stress test - Scheduled for PFTs soon - Continue spiriva and breo - Keep up activity as able - f/u in 2 months, sooner if doing unwell   MDM 42 minutes spent on this visit of which > 50% was spent face to face    End of visit medications:  Current Outpatient Medications:  .  acetaminophen (TYLENOL) 325 MG tablet, Take 650 mg by mouth every 4 (four) hours as needed for mild pain., Disp: , Rfl:  .  albuterol (VENTOLIN HFA) 108 (90 Base) MCG/ACT inhaler, Inhale 2 puffs into the lungs every 6 (six) hours as needed for wheezing., Disp: 18 g, Rfl: 6 .  alendronate (FOSAMAX) 70 MG tablet, TAKE 1 TABLET BY MOUTH EVERY 7 DAYS WITH A FULL GLASS OF WATER AND ON AN EMPTY STOMACH, Disp: 12 tablet, Rfl: 1 .  aspirin 81 MG EC tablet, Take 81 mg by mouth daily. , Disp: , Rfl:  .  atorvastatin (LIPITOR) 20 MG tablet, TAKE 1 TABLET(20 MG) BY MOUTH DAILY AT 6 PM, Disp: 90 tablet, Rfl: 1 .  Blood Glucose Monitoring Suppl (ONETOUCH VERIO) w/Device KIT, To check blood sugar once daily. E11.9, Disp: 1 kit, Rfl: 0 .  calcium gluconate 500 MG tablet, Take 1 tablet (500 mg total) by mouth 2 (two) times daily., Disp: 60 tablet, Rfl: prn .  fluticasone furoate-vilanterol (BREO ELLIPTA) 100-25 MCG/INH AEPB, Inhale 1 puff into the lungs daily., Disp: 30 each, Rfl: 11 .  glucose blood (ONETOUCH VERIO) test strip, To check blood sugar once  daily. E11.9, Disp: 100 each, Rfl: 12 .  Lancet Devices (ONE TOUCH DELICA LANCING DEV) MISC, To check blood sugar once daily. E11.9, Disp: 1 each, Rfl: 0 .  lisinopril (ZESTRIL) 10 MG tablet, TAKE 1 TABLET(10 MG) BY MOUTH DAILY, Disp: 90 tablet, Rfl: 1 .  metFORMIN (GLUCOPHAGE-XR) 500 MG 24 hr tablet, Take 1 tablet (500 mg total) by mouth daily with breakfast., Disp: 90 tablet, Rfl: 1 .  omeprazole (PRILOSEC) 20 MG capsule, TAKE ONE CAPSULE BY MOUTH DAILY AS NEEDED, PROLONGED USE MAY CAUSE RENAL IMPARIMENT, Disp: 90 capsule, Rfl: 1 .  OneTouch Delica Lancets 69G MISC, Please dispense 1 box. To check blood sugar once daily. E11.9, Disp: 100 each, Rfl: 12 .  SPIRIVA HANDIHALER 18 MCG inhalation capsule, PLACE 1 CAPSULE( 18 MCG TOTAL) INTO INHALER AND INHALE EVERY MORNING, Disp: 30 capsule, Rfl: 12 .  Vitamin D, Ergocalciferol, (DRISDOL) 1.25 MG (50000 UNIT) CAPS capsule, Take 1 capsule (50,000 Units total) by mouth every 7 (seven) days., Disp: 4 capsule, Rfl: 1  Current Facility-Administered Medications:  .  0.9 %  sodium chloride infusion, 500 mL, Intravenous, Once, Danis, Henry L III, MD .  0.9 %  sodium chloride infusion, 500 mL, Intravenous, Once, Danis, Kirke Corin, MD   Candee Furbish, MD [facility] 07/17/2019 1:15 PM    Subjective:  PATIENT ID: Desiree Parker GENDER: female DOB: [birth year], MRN: [id number]  Chief Complaint  Patient presents with  . Follow-up    breathing has improved some, sob with exertion, non productive cough in morning    HPI History as below. May have some mild benefit to breo but still feels below baseline.  Still has AM cough.  MMRC 2.  "Here for DOE. - History of smoking 2ppd x 30 years quit 1990 - For past 6-8 months, went from [facility] to [facility] 2-3 dyspnea.  Insidious onset and progression - On no home oxygen - Visibly SOB in office but saturations okay - Dyspnea accompanied by wheezing, occasional dry cough - Denies history of  asthma, seasonal allergies, sinus trouble - Accompanied by her son as she has some memory issues - Recently started regimen is spiriva respimat which she has been using 6 times a day and PRN albuterol which she has been using 1-2 times a day - Son manages majority of her meds - No aspiration issues seen - Carries diagnosis of COPD, I do not see PFTs on file"  Ancillary information including prior medications, full medical/surgical/family/social histoies, and PFTs (when available) are listed below and have been reviewed.   ROS + symptoms in bold Fevers, chills, weight loss Nausea, vomiting, diarrhea Shortness of breath, wheezing, cough Chest pain, palpitations, lower ext edema   Objective:   Vitals:   07/17/19 1107  BP: 128/68  Pulse: 70  Temp: (!) 97.2 F (36.2 C)  TempSrc: Temporal  SpO2: 98%  Weight: 201 lb 6.4 oz (91.4 kg)  Height: 5' (1.524 m)   98% on RA BMI Readings from Last 3 Encounters:  07/17/19 39.33 kg/m  06/20/19 39.72 kg/m  06/05/19 40.62 kg/m   Wt Readings from Last 3 Encounters:  07/17/19 201 lb 6.4 oz (91.4 kg)  06/20/19 203 lb 6.4 oz (92.3 kg)  06/05/19 208 lb (94.3 kg)    GEN: frail kyphotic woman in no acute distress HEENT: trachea midline, mucus membranes moist, malampatti 4 CV: Regular rate and rhythm, extremities are warm PULM: Clear bilaterally, no increased breath sounds on rapid breathing around larynx GI: Soft, +BS EXT: No edema NEURO: Moves all 4 extremities, globally weak   Ancillary Information    Past Medical History:  Diagnosis Date  . Alternating exotropia   . Arterial insufficiency (HCC) 02/04/2004   ABI 0.84  . CAP (community acquired pneumonia) 10/2015  . Cataract   . Chest pain 09/11/2015  . COPD (chronic obstructive pulmonary disease) ([place])   . Degenerative disk disease 10/2005   Lumbar, anterolithesis, L4-5,5-S1  . DEVELOPMENTAL READING DISORDER UNSPECIFIED 07/18/2007   Qualifier: Diagnosis of  By: Walker Kehr MD, Patrick Jupiter      . Diabetes mellitus without complication ([place])   . Estrogen deficiency 09/22/2015  . GERD (gastroesophageal reflux disease)   . Headache 03/11/2016  . Hilar mass    CT Chest 10/2015 IMPRESSION: No demonstrable pulmonary embolus. There is infiltrate consistent with pneumonia in portions of the right lower lobe. There is patchy atelectasis in the left lower lobe. There is no appreciable adenopathy. The prominence in the right hilum noted previously is apparently due to vascular prominence as opposed to mass or adenopathy. There are foci of aortic atheroscleroti  . Hyperlipidemia   . Hypertension   . 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 Noncompliance with medications 09/11/2015  . Osteoporosis   . TOBACCO USER 03/04/2009   Snuff dipper       Family History  Problem Relation  Age of Onset  . Diabetes Father   . Hypertension Mother   . Diabetes Mother   . Lung cancer Mother   . Hypertension Sister   . Asthma Son   . Colon polyps Neg Hx   . Esophageal cancer Neg Hx   . Stomach cancer Neg Hx   . Rectal cancer Neg Hx      Past Surgical History:  Procedure Laterality Date  . CESAREAN SECTION  1975  . CHOLECYSTECTOMY  1975   states gallstones removed  . TUBAL LIGATION  1975    Social History   Socioeconomic History  . Marital status: Single    Spouse name: Not on file  . Number of children: 1  . Years of education: 69  . Highest education level: Not on file  Occupational History  . Not on file  Tobacco Use  . Smoking status: Former Smoker    Packs/day: 2.00    Years: 27.00    Pack years: 54.00    Types: Cigarettes    Quit date: 09/24/1988    Years since quitting: 30.8  . Smokeless tobacco: Former Systems developer    Types: Snuff    Quit date: 07/05/2018  Substance and Sexual Activity  . Alcohol use: Yes    Alcohol/week: 2.0 standard drinks    Types: 2 Standard drinks or equivalent per week  . Drug use: No  . Sexual activity: Not Currently    Birth control/protection: Surgical   Other Topics Concern  . Not on file  Social History Narrative   Lives alone, retired Geophysicist/field seismologist   Son, Marena Witts in [place] Strain:   . Difficulty of Paying Living Expenses:   Food Insecurity:   . Worried About Charity fundraiser in the Last Year:   . Arboriculturist in the Last Year:   Transportation Needs:   . Film/video editor (Medical):   [name] Lack of Transportation (Non-Medical):   Physical Activity:   . Days of Exercise per Week:   . Minutes of Exercise per Session:   Stress:   . Feeling of Stress :   Social Connections:   . Frequency of Communication with Friends and Family:   . Frequency of Social Gatherings with Friends and Family:   . Attends Religious Services:   . Active Member of Clubs or Organizations:   . Attends Archivist Meetings:   [name] Marital Status:   Intimate Partner Violence:   . Fear of Current or Ex-Partner:   . Emotionally Abused:   [name] Physically Abused:   . Sexually Abused:      No Known Allergies   CBC    Component Value Date/Time   WBC 11.3 (H) 06/20/2019 1024   RBC 4.36 06/20/2019 1024   HGB 13.0 06/20/2019 1024   HGB 12.7 03/07/2019 0939   HCT 38.3 06/20/2019 1024   HCT 37.1 03/07/2019 0939   PLT 314.0 06/20/2019 1024   PLT 257 03/07/2019 0939   MCV 88.0 06/20/2019 1024   MCV 88 03/07/2019 0939   MCH 30.2 03/07/2019 0939   MCH 29.3 10/29/2015 0756   MCHC 34.0 06/20/2019 1024   RDW 13.1 06/20/2019 1024   RDW 12.7 03/07/2019 0939   LYMPHSABS 3.8 06/20/2019 1024   MONOABS 0.8 06/20/2019 1024   EOSABS 0.3 06/20/2019 1024   BASOSABS 0.1 06/20/2019 1024    Pulmonary Functions Testing  Results: No flowsheet data found.  Outpatient Medications Prior to Visit  Medication Sig Dispense Refill  . acetaminophen (TYLENOL) 325 MG tablet Take 650 mg by mouth every 4 (four) hours as needed for mild pain.    [name] albuterol (VENTOLIN HFA) 108 (90 Base) MCG/ACT  inhaler Inhale 2 puffs into the lungs every 6 (six) hours as needed for wheezing. 18 g 6  . alendronate (FOSAMAX) 70 MG tablet TAKE 1 TABLET BY MOUTH EVERY 7 DAYS WITH A FULL GLASS OF WATER AND ON AN EMPTY STOMACH 12 tablet 1  . aspirin 81 MG EC tablet Take 81 mg by mouth daily.     [name] atorvastatin (LIPITOR) 20 MG tablet TAKE 1 TABLET(20 MG) BY MOUTH DAILY AT 6 PM 90 tablet 1  . Blood Glucose Monitoring Suppl (ONETOUCH VERIO) w/Device KIT To check blood sugar once daily. E11.9 1 kit 0  . calcium gluconate 500 MG tablet Take 1 tablet (500 mg total) by mouth 2 (two) times daily. 60 tablet prn  . fluticasone furoate-vilanterol (BREO ELLIPTA) 100-25 MCG/INH AEPB Inhale 1 puff into the lungs daily. 30 each 11  . glucose blood (ONETOUCH VERIO) test strip To check blood sugar once daily. E11.9 100 each 12  . Lancet Devices (ONE TOUCH DELICA LANCING DEV) MISC To check blood sugar once daily. E11.9 1 each 0  . lisinopril (ZESTRIL) 10 MG tablet TAKE 1 TABLET(10 MG) BY MOUTH DAILY 90 tablet 1  . metFORMIN (GLUCOPHAGE-XR) 500 MG 24 hr tablet Take 1 tablet (500 mg total) by mouth daily with breakfast. 90 tablet 1  . omeprazole (PRILOSEC) 20 MG capsule TAKE ONE CAPSULE BY MOUTH DAILY AS NEEDED, PROLONGED USE MAY CAUSE RENAL IMPARIMENT 90 capsule 1  . OneTouch Delica Lancets 32R MISC Please dispense 1 box. To check blood sugar once daily. E11.9 100 each 12  . SPIRIVA HANDIHALER 18 MCG inhalation capsule PLACE 1 CAPSULE( 18 MCG TOTAL) INTO INHALER AND INHALE EVERY MORNING 30 capsule 12  . Vitamin D, Ergocalciferol, (DRISDOL) 1.25 MG (50000 UNIT) CAPS capsule Take 1 capsule (50,000 Units total) by mouth every 7 (seven) days. 4 capsule 1  . fluticasone furoate-vilanterol (BREO ELLIPTA) 100-25 MCG/INH AEPB Inhale 1 puff into the lungs daily. 1 each 0   Facility-Administered Medications Prior to Visit  Medication Dose Route Frequency Provider Last Rate Last Admin  . 0.9 %  sodium chloride infusion  500 mL Intravenous  Once Nelida Meuse III, MD      . 0.9 %  sodium chloride infusion  500 mL Intravenous Once Loletha Carrow Kirke Corin, MD

## 2019-07-17 NOTE — Patient Instructions (Addendum)
-  Continue current inhalers -Nuclear stress test -Pulmonary function tests -Sleep study -Increase exercise as needed

## 2019-07-18 DIAGNOSIS — G8929 Other chronic pain: Secondary | ICD-10-CM | POA: Diagnosis not present

## 2019-07-18 DIAGNOSIS — E785 Hyperlipidemia, unspecified: Secondary | ICD-10-CM | POA: Diagnosis not present

## 2019-07-18 DIAGNOSIS — M25561 Pain in right knee: Secondary | ICD-10-CM | POA: Diagnosis not present

## 2019-07-18 DIAGNOSIS — E1169 Type 2 diabetes mellitus with other specified complication: Secondary | ICD-10-CM | POA: Diagnosis not present

## 2019-07-18 DIAGNOSIS — I5032 Chronic diastolic (congestive) heart failure: Secondary | ICD-10-CM | POA: Diagnosis not present

## 2019-07-18 DIAGNOSIS — I110 Hypertensive heart disease with heart failure: Secondary | ICD-10-CM | POA: Diagnosis not present

## 2019-07-18 DIAGNOSIS — M25562 Pain in left knee: Secondary | ICD-10-CM | POA: Diagnosis not present

## 2019-07-18 DIAGNOSIS — E559 Vitamin D deficiency, unspecified: Secondary | ICD-10-CM | POA: Diagnosis not present

## 2019-07-18 DIAGNOSIS — M6281 Muscle weakness (generalized): Secondary | ICD-10-CM | POA: Diagnosis not present

## 2019-07-18 DIAGNOSIS — J449 Chronic obstructive pulmonary disease, unspecified: Secondary | ICD-10-CM | POA: Diagnosis not present

## 2019-07-18 DIAGNOSIS — Z7982 Long term (current) use of aspirin: Secondary | ICD-10-CM | POA: Diagnosis not present

## 2019-07-18 DIAGNOSIS — Z7984 Long term (current) use of oral hypoglycemic drugs: Secondary | ICD-10-CM | POA: Diagnosis not present

## 2019-07-20 ENCOUNTER — Ambulatory Visit: Payer: Medicare Other

## 2019-07-20 ENCOUNTER — Other Ambulatory Visit: Payer: Self-pay

## 2019-07-20 DIAGNOSIS — M25562 Pain in left knee: Secondary | ICD-10-CM | POA: Diagnosis not present

## 2019-07-20 DIAGNOSIS — M25561 Pain in right knee: Secondary | ICD-10-CM | POA: Diagnosis not present

## 2019-07-20 DIAGNOSIS — J449 Chronic obstructive pulmonary disease, unspecified: Secondary | ICD-10-CM | POA: Diagnosis not present

## 2019-07-20 DIAGNOSIS — I5032 Chronic diastolic (congestive) heart failure: Secondary | ICD-10-CM | POA: Diagnosis not present

## 2019-07-20 DIAGNOSIS — Z7984 Long term (current) use of oral hypoglycemic drugs: Secondary | ICD-10-CM | POA: Diagnosis not present

## 2019-07-20 DIAGNOSIS — Z7982 Long term (current) use of aspirin: Secondary | ICD-10-CM | POA: Diagnosis not present

## 2019-07-20 DIAGNOSIS — E785 Hyperlipidemia, unspecified: Secondary | ICD-10-CM | POA: Diagnosis not present

## 2019-07-20 DIAGNOSIS — M6281 Muscle weakness (generalized): Secondary | ICD-10-CM | POA: Diagnosis not present

## 2019-07-20 DIAGNOSIS — E559 Vitamin D deficiency, unspecified: Secondary | ICD-10-CM | POA: Diagnosis not present

## 2019-07-20 DIAGNOSIS — E1169 Type 2 diabetes mellitus with other specified complication: Secondary | ICD-10-CM | POA: Diagnosis not present

## 2019-07-20 DIAGNOSIS — G8929 Other chronic pain: Secondary | ICD-10-CM | POA: Diagnosis not present

## 2019-07-20 DIAGNOSIS — I110 Hypertensive heart disease with heart failure: Secondary | ICD-10-CM | POA: Diagnosis not present

## 2019-07-20 NOTE — Patient Instructions (Signed)
Visit Information  Goals Addressed            This Visit's Progress   . I would like Home health services (pt-stated)       CARE PLAN ENTRY (see longtitudinal plan of care for additional care plan information)  Current Barriers:  [name] Knowledge Deficits related to navigation of  community services for Home health and home aides  Nurse Case Manager Clinical Goal(s):  [name] Over the next 30 days, patient/son will verbalize understanding of plan   Interventions:  . Evaluation of current treatment plan and patient's adherence to plan as established by provider. [name] patient/son that I will initiate plan for [facility] and will wait for cofirmation of medicaid insurance for [employer] . Provided education to son how process works . Will collaborate with [facility] agency to have a nurse evaluation, PT.and OT, and the son states that his mother has [employer].  I have asked him to send me a copy of her card if she does I want to see about getting her a PCS working in the home.  She lives alone.  The son and his wife are currently going to the home 3 times/week  to help with cleaning.  They fill her pill box for her and need help. . Discussed plans with patient for ongoing care management follow up and provided patient with direct contact information for care management team . I have sent a message to the PCP for the referral . I will talk with the Social work about [facility] worker once I have confirmed that the patient has medicaid. . 06/29/19 . RN Case Manager called [facility] [phone] and referral faxed to [phone].  I will call next week to follow up on the information. . 07/04/19 . Spoke with Dalbert Mayotte [facility] called and spoke with his mother and stated that they will callback and try to come out on either Thursday or Friday. . I told Mr. Howren that had asked them to call ask speak with him as the main contact. . I called [facility] today and spoke with [place] and asked them to call him as the  contact for his mother.She stated that she would let the contacting personnel know. . Mr Gaal was notified that the process for the [facility] worker was started as well. . 07/20/19 . Spoke with Mr. Dravis and he is very happy with home health . He states they have sat down with his mother and educated her well.  They have helped him to condense her shopping list for better food choices. . The patient does her own cooking, she is taking her medications and cheking her blood sugars and blood pressure . He comes by the home and helps to straighten the home up for her.  He was unable to get PCS services at this time with her medicaid.  She does nt have full medicaid coverage but he states he will continue to look into that. . He states that he would like for me to start to call his mother on a weekly basis fpr her chronic conditions   Patient Self Care Activities:  . Patient/son verbalizes understanding of plan  . Attends all scheduled provider appointments . Calls pharmacy for medication refills . Calls provider office for new concerns or questions  Please see past updates related to this goal by clicking on the "Past Updates" button in the selected goal         Ms. Leatherman was given information about Care  Management services today including:  1. Care Management services include personalized support from designated clinical staff supervised by her physician, including individualized plan of care and coordination with other care providers 2. 24/7 contact phone numbers for assistance for urgent and routine care needs. 3. The patient may stop CCM services at any time (effective at the end of the month) by phone call to the office staff.  Patient agreed to services and verbal consent obtained.   The patient verbalized understanding of instructions provided today and declined a print copy of patient instruction materials.   The care management team will reach out to the patient again over the next 7 days.   The patient has been provided with contact information for the care management team and has been advised to call with any health related questions or concerns.   [name] RN, BSN, [facility] Care Management Coordinator [place] Phone: [phone] Fax: [phone]

## 2019-07-20 NOTE — Chronic Care Management (AMB) (Signed)
Care Management   Follow Up Note   07/20/2019 Name: Desiree Parker MRN: [id number] DOB: [birth year]  Referred by: Desiree Feil, MD Reason for referral : Care Coordination (Desiree Parker F/U)   [name] is a 74 y.o. year old female who is a primary care patient of Desiree Feil, MD. The care management team was consulted for assistance with care management and care coordination needs.    Review of patient status, including review of consultants reports, relevant laboratory and other test results, and collaboration with appropriate care team members and the patient's provider was performed as part of comprehensive patient evaluation and provision of chronic care management services.    SDOH (Social Determinants of Health) assessments performed: No See Care Plan activities for detailed interventions related to [facility])     Advanced Directives: See Care Plan and Vynca application for related entries.   Goals Addressed            This Visit's Progress   . I would like Home health services (pt-stated)       CARE PLAN ENTRY (see longtitudinal plan of care for additional care plan information)  Current Barriers:  Desiree Parker Knowledge Deficits related to navigation of  community services for Home health and home aides  Nurse Case Manager Clinical Goal(s):  Desiree Parker Over the next 30 days, patient/son will verbalize understanding of plan   Interventions:  . Evaluation of current treatment plan and patient's adherence to plan as established by provider. Desiree Parker patient/son that I will initiate plan for [facility] and will wait for cofirmation of medicaid insurance for [employer] . Provided education to son how process works . Will collaborate with Desiree Parker agency to have a nurse evaluation, PT.and OT, and the son states that his mother has [employer].  I have asked him to send me a copy of her card if she does I want to see about getting her a Parker working in the home.  She lives alone.  The son  and his wife are currently going to the home 3 times/week  to help with cleaning.  They fill her pill box for her and need help. . Discussed plans with patient for ongoing care management follow up and provided patient with direct contact information for care management team . I have sent a message to the PCP for the referral . I will talk with the Social work about [facility] worker once I have confirmed that the patient has medicaid. . 06/29/19 . RN Case Manager called [facility] [phone] and referral faxed to [phone].  I will call next week to follow up on the information. . 07/04/19 . Spoke with Desiree Parker [facility] called and spoke with his mother and stated that they will callback and try to come out on either Thursday or Friday. . I told Mr. Fero that had asked them to call ask speak with him as the main contact. . I called [facility] today and spoke with Desiree Parker and asked them to call him as the contact for his mother.She stated that she would let the contacting personnel know. . Mr Fear was notified that the process for the [facility] worker was started as well. . 07/20/19 . Spoke with Desiree Parker and he is very happy with home health . He states they have sat down with his mother and educated her well.  They have helped him to condense her shopping list for better food choices. . The patient does her own cooking, she  is taking her medications and cheking her blood sugars and blood pressure . He comes by the home and helps to straighten the home up for her.  He was unable to get Parker services at this time with her medicaid.  She does nt have full medicaid coverage but he states he will continue to look into that. . He states that he would like for me to start to call his mother on a weekly basis fpr her chronic conditions   Patient Self Care Activities:  . Patient/son verbalizes understanding of plan  . Attends all scheduled provider appointments . Calls pharmacy for medication refills . Calls  provider office for new concerns or questions  Please see past updates related to this goal by clicking on the "Past Updates" button in the selected goal          The care management team will reach out to the patient again over the next 7 days.  The patient has been provided with contact information for the care management team and has been advised to call with any health related questions or concerns.   Desiree Arms RN, BSN, [facility] Care Management Coordinator [place] Phone: [phone] Fax: [phone]

## 2019-07-23 DIAGNOSIS — I5032 Chronic diastolic (congestive) heart failure: Secondary | ICD-10-CM | POA: Diagnosis not present

## 2019-07-23 DIAGNOSIS — M25561 Pain in right knee: Secondary | ICD-10-CM | POA: Diagnosis not present

## 2019-07-23 DIAGNOSIS — M6281 Muscle weakness (generalized): Secondary | ICD-10-CM | POA: Diagnosis not present

## 2019-07-23 DIAGNOSIS — J449 Chronic obstructive pulmonary disease, unspecified: Secondary | ICD-10-CM | POA: Diagnosis not present

## 2019-07-23 DIAGNOSIS — Z7982 Long term (current) use of aspirin: Secondary | ICD-10-CM | POA: Diagnosis not present

## 2019-07-23 DIAGNOSIS — E1169 Type 2 diabetes mellitus with other specified complication: Secondary | ICD-10-CM | POA: Diagnosis not present

## 2019-07-23 DIAGNOSIS — E785 Hyperlipidemia, unspecified: Secondary | ICD-10-CM | POA: Diagnosis not present

## 2019-07-23 DIAGNOSIS — E559 Vitamin D deficiency, unspecified: Secondary | ICD-10-CM | POA: Diagnosis not present

## 2019-07-23 DIAGNOSIS — I110 Hypertensive heart disease with heart failure: Secondary | ICD-10-CM | POA: Diagnosis not present

## 2019-07-23 DIAGNOSIS — Z7984 Long term (current) use of oral hypoglycemic drugs: Secondary | ICD-10-CM | POA: Diagnosis not present

## 2019-07-23 DIAGNOSIS — G8929 Other chronic pain: Secondary | ICD-10-CM | POA: Diagnosis not present

## 2019-07-23 DIAGNOSIS — M25562 Pain in left knee: Secondary | ICD-10-CM | POA: Diagnosis not present

## 2019-07-25 ENCOUNTER — Encounter: Payer: Self-pay | Admitting: Podiatry

## 2019-07-25 ENCOUNTER — Ambulatory Visit (INDEPENDENT_AMBULATORY_CARE_PROVIDER_SITE_OTHER): Payer: Medicare Other | Admitting: Podiatry

## 2019-07-25 ENCOUNTER — Telehealth: Payer: Self-pay

## 2019-07-25 ENCOUNTER — Ambulatory Visit: Payer: Medicare Other

## 2019-07-25 ENCOUNTER — Other Ambulatory Visit: Payer: Self-pay

## 2019-07-25 DIAGNOSIS — Z7984 Long term (current) use of oral hypoglycemic drugs: Secondary | ICD-10-CM | POA: Diagnosis not present

## 2019-07-25 DIAGNOSIS — M25561 Pain in right knee: Secondary | ICD-10-CM | POA: Diagnosis not present

## 2019-07-25 DIAGNOSIS — G8929 Other chronic pain: Secondary | ICD-10-CM | POA: Diagnosis not present

## 2019-07-25 DIAGNOSIS — B351 Tinea unguium: Secondary | ICD-10-CM | POA: Diagnosis not present

## 2019-07-25 DIAGNOSIS — Z7982 Long term (current) use of aspirin: Secondary | ICD-10-CM | POA: Diagnosis not present

## 2019-07-25 DIAGNOSIS — E1169 Type 2 diabetes mellitus with other specified complication: Secondary | ICD-10-CM | POA: Diagnosis not present

## 2019-07-25 DIAGNOSIS — I5032 Chronic diastolic (congestive) heart failure: Secondary | ICD-10-CM | POA: Diagnosis not present

## 2019-07-25 DIAGNOSIS — M79674 Pain in right toe(s): Secondary | ICD-10-CM | POA: Diagnosis not present

## 2019-07-25 DIAGNOSIS — M25562 Pain in left knee: Secondary | ICD-10-CM | POA: Diagnosis not present

## 2019-07-25 DIAGNOSIS — M79675 Pain in left toe(s): Secondary | ICD-10-CM

## 2019-07-25 DIAGNOSIS — E785 Hyperlipidemia, unspecified: Secondary | ICD-10-CM | POA: Diagnosis not present

## 2019-07-25 DIAGNOSIS — E559 Vitamin D deficiency, unspecified: Secondary | ICD-10-CM | POA: Diagnosis not present

## 2019-07-25 DIAGNOSIS — I110 Hypertensive heart disease with heart failure: Secondary | ICD-10-CM | POA: Diagnosis not present

## 2019-07-25 DIAGNOSIS — M6281 Muscle weakness (generalized): Secondary | ICD-10-CM | POA: Diagnosis not present

## 2019-07-25 DIAGNOSIS — J449 Chronic obstructive pulmonary disease, unspecified: Secondary | ICD-10-CM | POA: Diagnosis not present

## 2019-07-25 NOTE — Telephone Encounter (Signed)
Verbal order approved. Please contact [facility] to inform them. Thanks.

## 2019-07-25 NOTE — Telephone Encounter (Signed)
Anne with [facility], calls nurse line requesting VO to see patient once this week to discharge from home health.   To PCP  Orders can be left on confidential voicemail at [place], RN

## 2019-07-25 NOTE — Telephone Encounter (Signed)
Called and gave verbal orders on confidential voicemail.   Talbot Grumbling, RN

## 2019-07-25 NOTE — Progress Notes (Signed)
This patient returns to my office for at risk foot care.  This patient requires this care by a professional since this patient will be at risk due to having diabetes.  This patient is unable to cut nails herself since the patient cannot reach her nails.These nails are painful walking and wearing shoes.  This patient presents for at risk foot care today.  General Appearance  Alert, conversant and in no acute stress.  Vascular  Dorsalis pedis  Are weakly  palpable  bilaterally. Posterior tibial pulses are absent  B/L. Capillary return is within normal limits  bilaterally. Temperature is within normal limits  bilaterally.  Neurologic  Senn-Weinstein monofilament wire test within normal limits  bilaterally. Muscle power within normal limits bilaterally.  Nails Thick disfigured discolored nails with subungual debris  Hallux nails  B/L. No evidence of bacterial infection or drainage bilaterally.  Orthopedic  No limitations of motion  feet .  No crepitus or effusions noted.  No bony pathology or digital deformities noted.  HAV  B/L.  Skin  normotropic skin with no porokeratosis noted bilaterally.  No signs of infections or ulcers noted.     Onychomycosis  Pain in right toes  Pain in left toes  Consent was obtained for treatment procedures.   Mechanical debridement of nails  hallux   bilaterally performed with a nail nipper.  Filed with dremel without incident.  RTC 3 months    Return office visit   3 months                   Told patient to return for periodic foot care and evaluation due to potential at risk complications.   Gardiner Barefoot DPM

## 2019-07-27 DIAGNOSIS — G8929 Other chronic pain: Secondary | ICD-10-CM | POA: Diagnosis not present

## 2019-07-27 DIAGNOSIS — E559 Vitamin D deficiency, unspecified: Secondary | ICD-10-CM | POA: Diagnosis not present

## 2019-07-27 DIAGNOSIS — I110 Hypertensive heart disease with heart failure: Secondary | ICD-10-CM | POA: Diagnosis not present

## 2019-07-27 DIAGNOSIS — M25562 Pain in left knee: Secondary | ICD-10-CM | POA: Diagnosis not present

## 2019-07-27 DIAGNOSIS — Z7984 Long term (current) use of oral hypoglycemic drugs: Secondary | ICD-10-CM | POA: Diagnosis not present

## 2019-07-27 DIAGNOSIS — E1169 Type 2 diabetes mellitus with other specified complication: Secondary | ICD-10-CM | POA: Diagnosis not present

## 2019-07-27 DIAGNOSIS — M25561 Pain in right knee: Secondary | ICD-10-CM | POA: Diagnosis not present

## 2019-07-27 DIAGNOSIS — Z7982 Long term (current) use of aspirin: Secondary | ICD-10-CM | POA: Diagnosis not present

## 2019-07-27 DIAGNOSIS — J449 Chronic obstructive pulmonary disease, unspecified: Secondary | ICD-10-CM | POA: Diagnosis not present

## 2019-07-27 DIAGNOSIS — M6281 Muscle weakness (generalized): Secondary | ICD-10-CM | POA: Diagnosis not present

## 2019-07-27 DIAGNOSIS — E785 Hyperlipidemia, unspecified: Secondary | ICD-10-CM | POA: Diagnosis not present

## 2019-07-27 DIAGNOSIS — I5032 Chronic diastolic (congestive) heart failure: Secondary | ICD-10-CM | POA: Diagnosis not present

## 2019-07-30 ENCOUNTER — Telehealth (HOSPITAL_COMMUNITY): Payer: Self-pay | Admitting: *Deleted

## 2019-07-30 ENCOUNTER — Encounter (HOSPITAL_COMMUNITY): Payer: Self-pay | Admitting: *Deleted

## 2019-07-30 DIAGNOSIS — E559 Vitamin D deficiency, unspecified: Secondary | ICD-10-CM | POA: Diagnosis not present

## 2019-07-30 DIAGNOSIS — J449 Chronic obstructive pulmonary disease, unspecified: Secondary | ICD-10-CM | POA: Diagnosis not present

## 2019-07-30 DIAGNOSIS — M25562 Pain in left knee: Secondary | ICD-10-CM | POA: Diagnosis not present

## 2019-07-30 DIAGNOSIS — Z7982 Long term (current) use of aspirin: Secondary | ICD-10-CM | POA: Diagnosis not present

## 2019-07-30 DIAGNOSIS — I110 Hypertensive heart disease with heart failure: Secondary | ICD-10-CM | POA: Diagnosis not present

## 2019-07-30 DIAGNOSIS — G8929 Other chronic pain: Secondary | ICD-10-CM | POA: Diagnosis not present

## 2019-07-30 DIAGNOSIS — E1169 Type 2 diabetes mellitus with other specified complication: Secondary | ICD-10-CM | POA: Diagnosis not present

## 2019-07-30 DIAGNOSIS — E785 Hyperlipidemia, unspecified: Secondary | ICD-10-CM | POA: Diagnosis not present

## 2019-07-30 DIAGNOSIS — I5032 Chronic diastolic (congestive) heart failure: Secondary | ICD-10-CM | POA: Diagnosis not present

## 2019-07-30 DIAGNOSIS — M25561 Pain in right knee: Secondary | ICD-10-CM | POA: Diagnosis not present

## 2019-07-30 DIAGNOSIS — Z7984 Long term (current) use of oral hypoglycemic drugs: Secondary | ICD-10-CM | POA: Diagnosis not present

## 2019-07-30 DIAGNOSIS — M6281 Muscle weakness (generalized): Secondary | ICD-10-CM | POA: Diagnosis not present

## 2019-07-30 NOTE — Telephone Encounter (Signed)
Patient's son with patient's permission given detailed instructions per Myocardial Perfusion Study Information Sheet for the test on 08/01/19. Patient notified to arrive 15 minutes early and that it is imperative to arrive on time for appointment to keep from having the test rescheduled.  If you need to cancel or reschedule your appointment, please call the office within 24 hours of your appointment. . Patient verbalized understanding. Kirstie Peri

## 2019-08-01 ENCOUNTER — Telehealth (HOSPITAL_COMMUNITY): Payer: Self-pay

## 2019-08-01 ENCOUNTER — Ambulatory Visit (HOSPITAL_COMMUNITY): Payer: Medicare Other | Attending: Cardiology

## 2019-08-01 ENCOUNTER — Other Ambulatory Visit: Payer: Self-pay

## 2019-08-01 DIAGNOSIS — E7800 Pure hypercholesterolemia, unspecified: Secondary | ICD-10-CM

## 2019-08-01 DIAGNOSIS — I5032 Chronic diastolic (congestive) heart failure: Secondary | ICD-10-CM | POA: Diagnosis not present

## 2019-08-01 DIAGNOSIS — M25561 Pain in right knee: Secondary | ICD-10-CM | POA: Diagnosis not present

## 2019-08-01 DIAGNOSIS — Z7984 Long term (current) use of oral hypoglycemic drugs: Secondary | ICD-10-CM | POA: Diagnosis not present

## 2019-08-01 DIAGNOSIS — E785 Hyperlipidemia, unspecified: Secondary | ICD-10-CM | POA: Diagnosis not present

## 2019-08-01 DIAGNOSIS — G4733 Obstructive sleep apnea (adult) (pediatric): Secondary | ICD-10-CM | POA: Diagnosis not present

## 2019-08-01 DIAGNOSIS — E1169 Type 2 diabetes mellitus with other specified complication: Secondary | ICD-10-CM | POA: Diagnosis not present

## 2019-08-01 DIAGNOSIS — E559 Vitamin D deficiency, unspecified: Secondary | ICD-10-CM | POA: Diagnosis not present

## 2019-08-01 DIAGNOSIS — E662 Morbid (severe) obesity with alveolar hypoventilation: Secondary | ICD-10-CM | POA: Insufficient documentation

## 2019-08-01 DIAGNOSIS — J449 Chronic obstructive pulmonary disease, unspecified: Secondary | ICD-10-CM | POA: Diagnosis not present

## 2019-08-01 DIAGNOSIS — I110 Hypertensive heart disease with heart failure: Secondary | ICD-10-CM | POA: Diagnosis not present

## 2019-08-01 DIAGNOSIS — Z7982 Long term (current) use of aspirin: Secondary | ICD-10-CM | POA: Diagnosis not present

## 2019-08-01 DIAGNOSIS — R0600 Dyspnea, unspecified: Secondary | ICD-10-CM | POA: Diagnosis not present

## 2019-08-01 DIAGNOSIS — M25562 Pain in left knee: Secondary | ICD-10-CM | POA: Diagnosis not present

## 2019-08-01 DIAGNOSIS — M6281 Muscle weakness (generalized): Secondary | ICD-10-CM | POA: Diagnosis not present

## 2019-08-01 DIAGNOSIS — G8929 Other chronic pain: Secondary | ICD-10-CM | POA: Diagnosis not present

## 2019-08-01 DIAGNOSIS — R0609 Other forms of dyspnea: Secondary | ICD-10-CM

## 2019-08-01 MED ORDER — TECHNETIUM TC 99M TETROFOSMIN IV KIT
32.0000 | PACK | Freq: Once | INTRAVENOUS | Status: AC | PRN
  Administered 2019-08-01: 32 via INTRAVENOUS
  Filled 2019-08-01: qty 32

## 2019-08-01 MED ORDER — REGADENOSON 0.4 MG/5ML IV SOLN
0.4000 mg | Freq: Once | INTRAVENOUS | Status: AC
  Administered 2019-08-01: 0.4 mg via INTRAVENOUS

## 2019-08-02 ENCOUNTER — Ambulatory Visit (HOSPITAL_COMMUNITY): Payer: Medicare Other | Attending: Cardiology

## 2019-08-02 ENCOUNTER — Other Ambulatory Visit: Payer: Self-pay | Admitting: *Deleted

## 2019-08-02 LAB — MYOCARDIAL PERFUSION IMAGING
LV dias vol: 41 mL (ref 46–106)
LV sys vol: 10 mL
Peak HR: 117 {beats}/min
Rest HR: 85 {beats}/min
SDS: 3
SRS: 0
SSS: 3
TID: 1.05

## 2019-08-02 MED ORDER — TECHNETIUM TC 99M TETROFOSMIN IV KIT
30.4000 | PACK | Freq: Once | INTRAVENOUS | Status: AC | PRN
  Administered 2019-08-02: 30.4 via INTRAVENOUS
  Filled 2019-08-02: qty 31

## 2019-08-02 MED ORDER — VITAMIN D (ERGOCALCIFEROL) 1.25 MG (50000 UNIT) PO CAPS: 50000.0000 [IU] | ORAL_CAPSULE | ORAL | 1 refills | Status: DC

## 2019-08-03 NOTE — Patient Instructions (Signed)
Visit Information  Goals Addressed            This Visit's Progress   . I check my blood sugars daily" (pt-stated)       CARE PLAN ENTRY (see longtitudinal plan of care for additional care plan information)  Objective:  Lab Results  Component Value Date   HGBA1C 6.8 06/05/2019 .   Lab Results  Component Value Date   CREATININE 0.88 06/05/2019   CREATININE 0.85 03/07/2019   CREATININE 0.76 05/10/2017 .   [name] No results found for: EGFR  Current Barriers:  [name] Knowledge Deficits related to basic Diabetes pathophysiology and self care/management  Case Manager Clinical Goal(s):  Over the next 90 days, patient will maintain adherence to prescribed treatment plan for diabetes self care/management as evidenced by: keeping a1c below 7.0  Interventions:  . Provided education to patient about basic DM disease process . Reviewed medications with patient and discussed importance of medication adherence . Discussed plans with patient for ongoing care management follow up and provided patient with direct contact information for care management team . Provided patient with written educational materials related to hypo and and importance of correct treatment- she knows to eat 6 pieces of candy or drink orange juice  or something with sugar to raise her blood sugar and recheck in 15 minutes . Advised patient, providing education and rationale, to check cbg daily and record, calling the office for findings outside established parameters.    Patient Self Care Activities:  . UNABLE to independently self manage diabaetes . Self administers medications as prescribed . Attends all scheduled provider appointments  Initial goal documentation     . I check my weight daily (pt-stated)       CARE PLAN ENTRY (see longtitudinal plan of care for additional care plan information)   Current Barriers:  [name] Knowledge deficit related to basic heart failure pathophysiology and self care management  Case  Manager Clinical Goal(s):  [name] Over the next 90 days, patient will weigh self daily and record  Interventions:  . Provided verbal education on low sodium diet . Reviewed Heart Failure Action Plan in depth  . Advised patient to weigh each morning after emptying bladder . Discussed importance of daily weight and advised patient to weigh and record daily . Patient stated her weight was 185 lbs . She denied cp, swelling , shob . She states that she does not use salt . She states that she drinks plenty of water  Patient Self Care Activities:  Desiree Parker understanding of and follows CHF Action Plan  Initial goal documentation     . COMPLETED: I would like Home health services (pt-stated)       CARE PLAN ENTRY (see longtitudinal plan of care for additional care plan information)  Current Barriers:  [name] Knowledge Deficits related to navigation of  community services for Home health and home aides  Nurse Case Manager Clinical Goal(s):  [name] Over the next 30 days, patient/son will verbalize understanding of plan   Interventions:  . Evaluation of current treatment plan and patient's adherence to plan as established by provider. [name] patient/son that I will initiate plan for [facility] and will wait for cofirmation of medicaid insurance for [employer] . Provided education to son how process works . Will collaborate with [facility] agency to have a nurse evaluation, PT.and OT, and the son states that his mother has [employer].  I have asked him to send me a copy of her card  if she does I want to see about getting her a PCS working in the home.  She lives alone.  The son and his wife are currently going to the home 3 times/week  to help with cleaning.  They fill her pill box for her and need help. . Discussed plans with patient for ongoing care management follow up and provided patient with direct contact information for care management team . I have sent a message to the PCP for the referral . I will  talk with the Social work about [facility] worker once I have confirmed that the patient has medicaid. . 06/29/19 . RN Case Manager called [facility] [phone] and referral faxed to [phone].  I will call next week to follow up on the information. . 07/04/19 . Spoke with Dalbert Mayotte [facility] called and spoke with his mother and stated that they will callback and try to come out on either Thursday or Friday. . I told Mr. Sergent that had asked them to call ask speak with him as the main contact. . I called [facility] today and spoke with [place] and asked them to call him as the contact for his mother.She stated that she would let the contacting personnel know. . Mr Kington was notified that the process for the [facility] worker was started as well. . 07/20/19 . Spoke with Mr. Abernathy and he is very happy with home health . He states they have sat down with his mother and educated her well.  They have helped him to condense her shopping list for better food choices. . The patient does her own cooking, she is taking her medications and cheking her blood sugars and blood pressure . He comes by the home and helps to straighten the home up for her.  He was unable to get PCS services at this time with her medicaid.  She does nt have full medicaid coverage but he states he will continue to look into that. . He states that he would like for me to start to call his mother on a weekly basis for her chronic conditions . 07/25/19 . Patient has home health working with mother and son is very happy with service   Patient Self Care Activities:  . Patient/son verbalizes understanding of plan  . Attends all scheduled provider appointments . Calls pharmacy for medication refills . Calls provider office for new concerns or questions  Please see past updates related to this goal by clicking on the "Past Updates" button in the selected goal         Desiree Parker was given information about Care Management services today including:   1. Care Management services include personalized support from designated clinical staff supervised by her physician, including individualized plan of care and coordination with other care providers 2. 24/7 contact phone numbers for assistance for urgent and routine care needs. 3. The patient may stop CCM services at any time (effective at the end of the month) by phone call to the office staff.  Patient agreed to services and verbal consent obtained.   The patient verbalized understanding of instructions provided today and declined a print copy of patient instruction materials.   The care management team will reach out to the patient again over the next 14 days.  The patient has been provided with contact information for the care management team and has been advised to call with any health related questions or concerns.   [name] RN, BSN, [place]  [place] Phone: [phone] Fax: [phone]

## 2019-08-03 NOTE — Chronic Care Management (AMB) (Signed)
Care Management   Initial Visit Note  08/03/2019 Name: Desiree Parker MRN: [id number] DOB: September 11, 1945   Assessment: ANNAJULIA Parker is a 74 y.o. year old female who sees [name], MD for primary care. The care management team was consulted for assistance with care management and care coordination needs related to Disease Management Educational Needs for CHF/ DM II   Review of patient status, including review of consultants reports, relevant laboratory and other test results, and collaboration with appropriate care team members and the patient's provider was performed as part of comprehensive patient evaluation and provision of care management services.    SDOH (Social Determinants of Health) assessments performed: No See Care Plan activities for detailed interventions related to [facility])     Outpatient Encounter Medications as of 07/25/2019  Medication Sig Note  . acetaminophen (TYLENOL) 325 MG tablet Take 650 mg by mouth every 4 (four) hours as needed for mild pain.   [name] albuterol (VENTOLIN HFA) 108 (90 Base) MCG/ACT inhaler Inhale 2 puffs into the lungs every 6 (six) hours as needed for wheezing.   [name] alendronate (FOSAMAX) 70 MG tablet TAKE 1 TABLET BY MOUTH EVERY 7 DAYS WITH A FULL GLASS OF WATER AND ON AN EMPTY STOMACH   . aspirin 81 MG EC tablet Take 81 mg by mouth daily.    [name] atorvastatin (LIPITOR) 20 MG tablet TAKE 1 TABLET(20 MG) BY MOUTH DAILY AT 6 PM   . Blood Glucose Monitoring Suppl (ONETOUCH VERIO) w/Device KIT To check blood sugar once daily. E11.9   . calcium gluconate 500 MG tablet Take 1 tablet (500 mg total) by mouth 2 (two) times daily.   [name] FLUAD QUADRIVALENT 0.5 ML injection    . fluticasone furoate-vilanterol (BREO ELLIPTA) 100-25 MCG/INH AEPB Inhale 1 puff into the lungs daily.   [name] glucose blood (ONETOUCH VERIO) test strip To check blood sugar once daily. E11.9   . Lancet Devices (ONE TOUCH DELICA LANCING DEV) MISC To check blood sugar once daily. E11.9   .  lisinopril (ZESTRIL) 10 MG tablet TAKE 1 TABLET(10 MG) BY MOUTH DAILY   . metFORMIN (GLUCOPHAGE-XR) 500 MG 24 hr tablet Take 1 tablet (500 mg total) by mouth daily with breakfast.   . omeprazole (PRILOSEC) 20 MG capsule TAKE ONE CAPSULE BY MOUTH DAILY AS NEEDED, PROLONGED USE MAY CAUSE RENAL IMPARIMENT 02/26/2019: Takes  1 daily  . OneTouch Delica Lancets 57X MISC Please dispense 1 box. To check blood sugar once daily. E11.9   . SPIRIVA HANDIHALER 18 MCG inhalation capsule PLACE 1 CAPSULE( 18 MCG TOTAL) INTO INHALER AND INHALE EVERY MORNING   . [DISCONTINUED] Vitamin D, Ergocalciferol, (DRISDOL) 1.25 MG (50000 UNIT) CAPS capsule Take 1 capsule (50,000 Units total) by mouth every 7 (seven) days.    Facility-Administered Encounter Medications as of 07/25/2019  Medication  . 0.9 %  sodium chloride infusion  . 0.9 %  sodium chloride infusion    Goals Addressed            This Visit's Progress   . I check my blood sugars daily" (pt-stated)       CARE PLAN ENTRY (see longtitudinal plan of care for additional care plan information)  Objective:  Lab Results  Component Value Date   HGBA1C 6.8 06/05/2019 .   Lab Results  Component Value Date   CREATININE 0.88 06/05/2019   CREATININE 0.85 03/07/2019   CREATININE 0.76 05/10/2017 .   [name] No results found for: EGFR  Current Barriers:  .  Knowledge Deficits related to basic Diabetes pathophysiology and self care/management  Case Manager Clinical Goal(s):  Over the next 90 days, patient will maintain adherence to prescribed treatment plan for diabetes self care/management as evidenced by: keeping a1c below 7.0  Interventions:  . Provided education to patient about basic DM disease process . Reviewed medications with patient and discussed importance of medication adherence . Discussed plans with patient for ongoing care management follow up and provided patient with direct contact information for care management team . Provided patient with  written educational materials related to hypo and and importance of correct treatment- she knows to eat 6 pieces of candy or drink orange juice  or something with sugar to raise her blood sugar and recheck in 15 minutes . Advised patient, providing education and rationale, to check cbg daily and record, calling the office for findings outside established parameters.    Patient Self Care Activities:  . UNABLE to independently self manage diabaetes . Self administers medications as prescribed . Attends all scheduled provider appointments  Initial goal documentation     . I check my weight daily (pt-stated)       CARE PLAN ENTRY (see longtitudinal plan of care for additional care plan information)   Current Barriers:  [name] Knowledge deficit related to basic heart failure pathophysiology and self care management  Case Manager Clinical Goal(s):  [name] Over the next 90 days, patient will weigh self daily and record  Interventions:  . Provided verbal education on low sodium diet . Reviewed Heart Failure Action Plan in depth  . Advised patient to weigh each morning after emptying bladder . Discussed importance of daily weight and advised patient to weigh and record daily . Patient stated her weight was 185 lbs . She denied cp, swelling , shob . She states that she does not use salt . She states that she drinks plenty of water  Patient Self Care Activities:  Desiree Parker understanding of and follows CHF Action Plan  Initial goal documentation     . COMPLETED: I would like Home health services (pt-stated)       CARE PLAN ENTRY (see longtitudinal plan of care for additional care plan information)  Current Barriers:  [name] Knowledge Deficits related to navigation of  community services for Home health and home aides  Nurse Case Manager Clinical Goal(s):  [name] Over the next 30 days, patient/son will verbalize understanding of plan   Interventions:  . Evaluation of current treatment plan and  patient's adherence to plan as established by provider. [name] patient/son that I will initiate plan for [facility] and will wait for cofirmation of medicaid insurance for [employer] . Provided education to son how process works . Will collaborate with [facility] agency to have a nurse evaluation, PT.and OT, and the son states that his mother has [employer].  I have asked him to send me a copy of her card if she does I want to see about getting her a PCS working in the home.  She lives alone.  The son and his wife are currently going to the home 3 times/week  to help with cleaning.  They fill her pill box for her and need help. . Discussed plans with patient for ongoing care management follow up and provided patient with direct contact information for care management team . I have sent a message to the PCP for the referral . I will talk with the Social work about [facility] worker once I have confirmed  that the patient has medicaid. . 06/29/19 . RN Case Manager called [facility] [phone] and referral faxed to [phone].  I will call next week to follow up on the information. . 07/04/19 . Spoke with Dalbert Mayotte [facility] called and spoke with his mother and stated that they will callback and try to come out on either Thursday or Friday. . I told Mr. Tout that had asked them to call ask speak with him as the main contact. . I called [facility] today and spoke with [place] and asked them to call him as the contact for his mother.She stated that she would let the contacting personnel know. . Mr Mairena was notified that the process for the [facility] worker was started as well. . 07/20/19 . Spoke with Mr. Sporrer and he is very happy with home health . He states they have sat down with his mother and educated her well.  They have helped him to condense her shopping list for better food choices. . The patient does her own cooking, she is taking her medications and cheking her blood sugars and blood pressure . He comes by the home  and helps to straighten the home up for her.  He was unable to get PCS services at this time with her medicaid.  She does nt have full medicaid coverage but he states he will continue to look into that. . He states that he would like for me to start to call his mother on a weekly basis for her chronic conditions . 07/25/19 . Patient has home health working with mother and son is very happy with service   Patient Self Care Activities:  . Patient/son verbalizes understanding of plan  . Attends all scheduled provider appointments . Calls pharmacy for medication refills . Calls provider office for new concerns or questions  Please see past updates related to this goal by clicking on the "Past Updates" button in the selected goal           Follow up plan:  The care management team will reach out to the patient again over the next 14 days.  The patient has been provided with contact information for the care management team and has been advised to call with any health related questions or concerns.   Ms. Difonzo was given information about Care Management services today including:  1. Care Management services include personalized support from designated clinical staff supervised by a physician, including individualized plan of care and coordination with other care providers 2. 24/7 contact phone numbers for assistance for urgent and routine care needs. 3. The patient may stop Care Management services at any time (effective at the end of the month) by phone call to the office staff.  Patient agreed to services and verbal consent obtained.  [name] RN, BSN, [facility] Care Management Coordinator [place] Phone: [phone] Fax: [phone]

## 2019-08-06 ENCOUNTER — Other Ambulatory Visit (HOSPITAL_COMMUNITY): Payer: Medicare Other

## 2019-08-07 DIAGNOSIS — E559 Vitamin D deficiency, unspecified: Secondary | ICD-10-CM | POA: Diagnosis not present

## 2019-08-07 DIAGNOSIS — E785 Hyperlipidemia, unspecified: Secondary | ICD-10-CM | POA: Diagnosis not present

## 2019-08-07 DIAGNOSIS — M6281 Muscle weakness (generalized): Secondary | ICD-10-CM | POA: Diagnosis not present

## 2019-08-07 DIAGNOSIS — Z7982 Long term (current) use of aspirin: Secondary | ICD-10-CM | POA: Diagnosis not present

## 2019-08-07 DIAGNOSIS — I5032 Chronic diastolic (congestive) heart failure: Secondary | ICD-10-CM | POA: Diagnosis not present

## 2019-08-07 DIAGNOSIS — Z7984 Long term (current) use of oral hypoglycemic drugs: Secondary | ICD-10-CM | POA: Diagnosis not present

## 2019-08-07 DIAGNOSIS — M25561 Pain in right knee: Secondary | ICD-10-CM | POA: Diagnosis not present

## 2019-08-07 DIAGNOSIS — G8929 Other chronic pain: Secondary | ICD-10-CM | POA: Diagnosis not present

## 2019-08-07 DIAGNOSIS — M25562 Pain in left knee: Secondary | ICD-10-CM | POA: Diagnosis not present

## 2019-08-07 DIAGNOSIS — I110 Hypertensive heart disease with heart failure: Secondary | ICD-10-CM | POA: Diagnosis not present

## 2019-08-07 DIAGNOSIS — J449 Chronic obstructive pulmonary disease, unspecified: Secondary | ICD-10-CM | POA: Diagnosis not present

## 2019-08-07 DIAGNOSIS — E1169 Type 2 diabetes mellitus with other specified complication: Secondary | ICD-10-CM | POA: Diagnosis not present

## 2019-08-08 ENCOUNTER — Other Ambulatory Visit: Payer: Self-pay

## 2019-08-08 ENCOUNTER — Other Ambulatory Visit: Payer: Self-pay | Admitting: *Deleted

## 2019-08-08 ENCOUNTER — Ambulatory Visit: Payer: Medicare Other

## 2019-08-08 ENCOUNTER — Encounter (HOSPITAL_BASED_OUTPATIENT_CLINIC_OR_DEPARTMENT_OTHER): Payer: Medicare Other | Admitting: Internal Medicine

## 2019-08-08 DIAGNOSIS — K219 Gastro-esophageal reflux disease without esophagitis: Secondary | ICD-10-CM

## 2019-08-08 MED ORDER — OMEPRAZOLE 20 MG PO CPDR: DELAYED_RELEASE_CAPSULE | ORAL | 1 refills | Status: DC

## 2019-08-08 NOTE — Patient Instructions (Signed)
Visit Information  Goals Addressed            This Visit's Progress   . I check my blood sugars daily" (pt-stated)       CARE PLAN ENTRY (see longtitudinal plan of care for additional care plan information)  Objective:  Lab Results  Component Value Date   HGBA1C 6.8 06/05/2019 .   Lab Results  Component Value Date   CREATININE 0.88 06/05/2019   CREATININE 0.85 03/07/2019   CREATININE 0.76 05/10/2017 .   [name] No results found for: EGFR  Current Barriers:  [name] Knowledge Deficits related to basic Diabetes pathophysiology and self care/management  Case Manager Clinical Goal(s):  Over the next 90 days, patient will maintain adherence to prescribed treatment plan for diabetes self care/management as evidenced by: keeping a1c below 7.0  Interventions:  . Provided education to patient about basic DM disease process . Reviewed medications with patient and discussed importance of medication adherence . Discussed plans with patient for ongoing care management follow up and provided patient with direct contact information for care management team . Provided patient with written educational materials related to hypo and and importance of correct treatment- she knows to eat 6 pieces of candy or drink orange juice  or something with sugar to raise her blood sugar and recheck in 15 minutes . Advised patient, providing education and rationale, to check cbg daily and record, calling the office for findings outside established parameters.   . 08/08/19 . Patient states that her blood sugars range between 99-104 . She has a good appetite and she is sleeping well   Patient Self Care Activities:  . UNABLE to independently self manage diabaetes . Self administers medications as prescribed . Attends all scheduled provider appointments  Please see past updates related to this goal by clicking on the "Past Updates" button in the selected goal      . I check my weight daily (pt-stated)       [place] (see longtitudinal plan of care for additional care plan information)   Current Barriers:  [name] Knowledge deficit related to basic heart failure pathophysiology and self care management  Case Manager Clinical Goal(s):  [name] Over the next 90 days, patient will weigh self daily and record  Interventions:  . Provided verbal education on low sodium diet . Reviewed Heart Failure Action Plan in depth  . Advised patient to weigh each morning after emptying bladder . Discussed importance of daily weight and advised patient to weigh and record daily . Patient stated her weight was 185 lbs . She denied cp, swelling , shob . She states that she does not use salt . She states that she drinks plenty of water . 08/08/19 . The patient's weight today is 186.4 . She denies any chest pain , shob, or swelling  . The nurse is coming to the home 1 time /weeks for the next 2 weeks  Patient Self Care Activities:  [name] understanding of and follows CHF Action Plan  Please see past updates related to this goal by clicking on the "Past Updates" button in the selected goal         Ms. Taves was given information about Care Management services today including:  1. Care Management services include personalized support from designated clinical staff supervised by her physician, including individualized plan of care and coordination with other care providers 2. 24/7 contact phone numbers for assistance for urgent and routine care needs. 3. The patient  may stop CCM services at any time (effective at the end of the month) by phone call to the office staff.  Patient agreed to services and verbal consent obtained.   The patient verbalized understanding of instructions provided today and declined a print copy of patient instruction materials.   The care management team will reach out to the patient again over the next 14 days.  The patient has been provided with contact information for the care  management team and has been advised to call with any health related questions or concerns.   [name] RN, BSN, [facility] Care Management Coordinator [place] Phone: [phone] Fax: [phone]

## 2019-08-08 NOTE — Chronic Care Management (AMB) (Signed)
Care Management   Follow Up Note   08/08/2019 Name: Desiree Parker MRN: [id number] DOB: 07-03-1945  Referred by: [name], MD Reason for referral : Care Coordination (Care Management RNCM F/U)   Desiree Parker is a 74 y.o. year old female who is a primary care patient of [name], MD. The care management team was consulted for assistance with care management and care coordination needs.    Review of patient status, including review of consultants reports, relevant laboratory and other test results, and collaboration with appropriate care team members and the patient's provider was performed as part of comprehensive patient evaluation and provision of chronic care management services.    SDOH (Social Determinants of Health) assessments performed: No See Care Plan activities for detailed interventions related to [facility])     Advanced Directives: See Care Plan and Vynca application for related entries.   Goals Addressed            This Visit's Progress   . I check my blood sugars daily" (pt-stated)       CARE PLAN ENTRY (see longtitudinal plan of care for additional care plan information)  Objective:  Lab Results  Component Value Date   HGBA1C 6.8 06/05/2019 .   Lab Results  Component Value Date   CREATININE 0.88 06/05/2019   CREATININE 0.85 03/07/2019   CREATININE 0.76 05/10/2017 .   [name] No results found for: EGFR  Current Barriers:  [name] Knowledge Deficits related to basic Diabetes pathophysiology and self care/management  Case Manager Clinical Goal(s):  Over the next 90 days, patient will maintain adherence to prescribed treatment plan for diabetes self care/management as evidenced by: keeping a1c below 7.0  Interventions:  . Provided education to patient about basic DM disease process . Reviewed medications with patient and discussed importance of medication adherence . Discussed plans with patient for ongoing care management follow up and provided  patient with direct contact information for care management team . Provided patient with written educational materials related to hypo and and importance of correct treatment- she knows to eat 6 pieces of candy or drink orange juice  or something with sugar to raise her blood sugar and recheck in 15 minutes . Advised patient, providing education and rationale, to check cbg daily and record, calling the office for findings outside established parameters.   . 08/08/19 . Patient states that her blood sugars range between 99-104 . She has a good appetite and she is sleeping well   Patient Self Care Activities:  . UNABLE to independently self manage diabaetes . Self administers medications as prescribed . Attends all scheduled provider appointments  Please see past updates related to this goal by clicking on the "Past Updates" button in the selected goal      . I check my weight daily (pt-stated)       [place] (see longtitudinal plan of care for additional care plan information)   Current Barriers:  [name] Knowledge deficit related to basic heart failure pathophysiology and self care management  Case Manager Clinical Goal(s):  [name] Over the next 90 days, patient will weigh self daily and record  Interventions:  . Provided verbal education on low sodium diet . Reviewed Heart Failure Action Plan in depth  . Advised patient to weigh each morning after emptying bladder . Discussed importance of daily weight and advised patient to weigh and record daily . Patient stated her weight was 185 lbs . She denied cp, swelling ,  shob . She states that she does not use salt . She states that she drinks plenty of water . 08/08/19 . The patient's weight today is 186.4 . She denies any chest pain , shob, or swelling  . The nurse is coming to the home 1 time /weeks for the next 2 weeks  Patient Self Care Activities:  [name] understanding of and follows CHF Action Plan  Please see past updates  related to this goal by clicking on the "Past Updates" button in the selected goal          The care management team will reach out to the patient again over the next 14 days.  The patient has been provided with contact information for the care management team and has been advised to call with any health related questions or concerns.   [name] RN, BSN, [facility] Care Management Coordinator [place] Phone: [phone] Fax: [phone]

## 2019-08-14 DIAGNOSIS — Z7984 Long term (current) use of oral hypoglycemic drugs: Secondary | ICD-10-CM | POA: Diagnosis not present

## 2019-08-14 DIAGNOSIS — M25561 Pain in right knee: Secondary | ICD-10-CM | POA: Diagnosis not present

## 2019-08-14 DIAGNOSIS — I5032 Chronic diastolic (congestive) heart failure: Secondary | ICD-10-CM | POA: Diagnosis not present

## 2019-08-14 DIAGNOSIS — E1169 Type 2 diabetes mellitus with other specified complication: Secondary | ICD-10-CM | POA: Diagnosis not present

## 2019-08-14 DIAGNOSIS — E785 Hyperlipidemia, unspecified: Secondary | ICD-10-CM | POA: Diagnosis not present

## 2019-08-14 DIAGNOSIS — G8929 Other chronic pain: Secondary | ICD-10-CM | POA: Diagnosis not present

## 2019-08-14 DIAGNOSIS — M25562 Pain in left knee: Secondary | ICD-10-CM | POA: Diagnosis not present

## 2019-08-14 DIAGNOSIS — Z7982 Long term (current) use of aspirin: Secondary | ICD-10-CM | POA: Diagnosis not present

## 2019-08-14 DIAGNOSIS — E559 Vitamin D deficiency, unspecified: Secondary | ICD-10-CM | POA: Diagnosis not present

## 2019-08-14 DIAGNOSIS — I110 Hypertensive heart disease with heart failure: Secondary | ICD-10-CM | POA: Diagnosis not present

## 2019-08-14 DIAGNOSIS — J449 Chronic obstructive pulmonary disease, unspecified: Secondary | ICD-10-CM | POA: Diagnosis not present

## 2019-08-14 DIAGNOSIS — M6281 Muscle weakness (generalized): Secondary | ICD-10-CM | POA: Diagnosis not present

## 2019-08-21 ENCOUNTER — Ambulatory Visit: Payer: Medicare Other

## 2019-08-21 ENCOUNTER — Other Ambulatory Visit: Payer: Self-pay

## 2019-08-21 DIAGNOSIS — Z7984 Long term (current) use of oral hypoglycemic drugs: Secondary | ICD-10-CM | POA: Diagnosis not present

## 2019-08-21 DIAGNOSIS — G8929 Other chronic pain: Secondary | ICD-10-CM | POA: Diagnosis not present

## 2019-08-21 DIAGNOSIS — E1169 Type 2 diabetes mellitus with other specified complication: Secondary | ICD-10-CM | POA: Diagnosis not present

## 2019-08-21 DIAGNOSIS — M25562 Pain in left knee: Secondary | ICD-10-CM | POA: Diagnosis not present

## 2019-08-21 DIAGNOSIS — E785 Hyperlipidemia, unspecified: Secondary | ICD-10-CM | POA: Diagnosis not present

## 2019-08-21 DIAGNOSIS — J449 Chronic obstructive pulmonary disease, unspecified: Secondary | ICD-10-CM | POA: Diagnosis not present

## 2019-08-21 DIAGNOSIS — I110 Hypertensive heart disease with heart failure: Secondary | ICD-10-CM | POA: Diagnosis not present

## 2019-08-21 DIAGNOSIS — Z7982 Long term (current) use of aspirin: Secondary | ICD-10-CM | POA: Diagnosis not present

## 2019-08-21 DIAGNOSIS — M6281 Muscle weakness (generalized): Secondary | ICD-10-CM | POA: Diagnosis not present

## 2019-08-21 DIAGNOSIS — I5032 Chronic diastolic (congestive) heart failure: Secondary | ICD-10-CM | POA: Diagnosis not present

## 2019-08-21 DIAGNOSIS — M25561 Pain in right knee: Secondary | ICD-10-CM | POA: Diagnosis not present

## 2019-08-21 DIAGNOSIS — E559 Vitamin D deficiency, unspecified: Secondary | ICD-10-CM | POA: Diagnosis not present

## 2019-08-21 NOTE — Patient Instructions (Signed)
Visit Information  Goals Addressed            This Visit's Progress   . I check my blood sugars daily" (pt-stated)       CARE PLAN ENTRY (see longtitudinal plan of care for additional care plan information)  Objective:  Lab Results  Component Value Date   HGBA1C 6.8 06/05/2019 .   Lab Results  Component Value Date   CREATININE 0.88 06/05/2019   CREATININE 0.85 03/07/2019   CREATININE 0.76 05/10/2017 .   [name] No results found for: EGFR  Current Barriers:  [name] Knowledge Deficits related to basic Diabetes pathophysiology and self care/management  Case Manager Clinical Goal(s):  Over the next 90 days, patient will maintain adherence to prescribed treatment plan for diabetes self care/management as evidenced by: keeping a1c below 7.0  Interventions:  . Provided education to patient about basic DM disease process . Reviewed medications with patient and discussed importance of medication adherence . Discussed plans with patient for ongoing care management follow up and provided patient with direct contact information for care management team . Provided patient with written educational materials related to hypo and and importance of correct treatment- she knows to eat 6 pieces of candy or drink orange juice  or something with sugar to raise her blood sugar and recheck in 15 minutes . Advised patient, providing education and rationale, to check cbg daily and record, calling the office for findings outside established parameters.   . 08/08/19 . Patient states that her blood sugars range between 99-104 . She has a good appetite and she is sleeping well . 08/21/19 . Patient states that she has been eating a little more during mothers day and her sugar was a little up around 144 . Thins morning it was around 84 . She states that she is still exercising and drinking water . Advised patient to continue her efforts   Patient Self Care Activities:  . UNABLE to independently self manage  diabaetes . Self administers medications as prescribed . Attends all scheduled provider appointments  Please see past updates related to this goal by clicking on the "Past Updates" button in the selected goal      . I check my weight daily (pt-stated)       [place] (see longtitudinal plan of care for additional care plan information)   Current Barriers:  [name] Knowledge deficit related to basic heart failure pathophysiology and self care management  Case Manager Clinical Goal(s):  [name] Over the next 90 days, patient will weigh self daily and record  Interventions:  . Provided verbal education on low sodium diet . Reviewed Heart Failure Action Plan in depth  . Advised patient to weigh each morning after emptying bladder . Discussed importance of daily weight and advised patient to weigh and record daily . Patient stated her weight was 185 lbs . She denied cp, swelling , shob . She states that she does not use salt . She states that she drinks plenty of water . 08/08/19 . The patient's weight today is 186.4 . She denies any chest pain , shob, or swelling  . The nurse is coming to the home 1 time /weeks for the next 2 weeks . 08/21/19 . Patient states that her weight today was 186.2  . She is staying constant with her weight  . She denies any chest pain shortness of breath or swelling  Patient Self Care Activities:  Desiree Parker understanding of and follows CHF Action Plan  Please see past updates related to this goal by clicking on the "Past Updates" button in the selected goal         Desiree Parker was given information about Care Management services today including:  1. Care Management services include personalized support from designated clinical staff supervised by her physician, including individualized plan of care and coordination with other care providers 2. 24/7 contact phone numbers for assistance for urgent and routine care needs. 3. The patient may stop CCM  services at any time (effective at the end of the month) by phone call to the office staff.  Patient agreed to services and verbal consent obtained.   The patient verbalized understanding of instructions provided today and declined a print copy of patient instruction materials.   The care management team will reach out to the patient again over the next 14 days.  The patient has been provided with contact information for the care management team and has been advised to call with any health related questions or concerns.   [name] RN, BSN, [facility] Care Management Coordinator [place] Phone: [phone] Fax: [phone]

## 2019-08-21 NOTE — Chronic Care Management (AMB) (Signed)
Care Management   Follow Up Note   08/21/2019 Name: Desiree Parker MRN: [id number] DOB: 07/13/1945  Referred by: [name], MD Reason for referral : Care Coordination (Care Management RNCM DMII/CHF)   Desiree Parker is a 74 y.o. year old female who is a primary care patient of [name], MD. The care management team was consulted for assistance with care management and care coordination needs.    Review of patient status, including review of consultants reports, relevant laboratory and other test results, and collaboration with appropriate care team members and the patient's provider was performed as part of comprehensive patient evaluation and provision of chronic care management services.    SDOH (Social Determinants of Health) assessments performed: No See Care Plan activities for detailed interventions related to [facility])     Advanced Directives: See Care Plan and Vynca application for related entries.   Goals Addressed            This Visit's Progress   . I check my blood sugars daily" (pt-stated)       CARE PLAN ENTRY (see longtitudinal plan of care for additional care plan information)  Objective:  Lab Results  Component Value Date   HGBA1C 6.8 06/05/2019 .   Lab Results  Component Value Date   CREATININE 0.88 06/05/2019   CREATININE 0.85 03/07/2019   CREATININE 0.76 05/10/2017 .   [name] No results found for: EGFR  Current Barriers:  [name] Knowledge Deficits related to basic Diabetes pathophysiology and self care/management  Case Manager Clinical Goal(s):  Over the next 90 days, patient will maintain adherence to prescribed treatment plan for diabetes self care/management as evidenced by: keeping a1c below 7.0  Interventions:  . Provided education to patient about basic DM disease process . Reviewed medications with patient and discussed importance of medication adherence . Discussed plans with patient for ongoing care management follow up and provided  patient with direct contact information for care management team . Provided patient with written educational materials related to hypo and and importance of correct treatment- she knows to eat 6 pieces of candy or drink orange juice  or something with sugar to raise her blood sugar and recheck in 15 minutes . Advised patient, providing education and rationale, to check cbg daily and record, calling the office for findings outside established parameters.   . 08/08/19 . Patient states that her blood sugars range between 99-104 . She has a good appetite and she is sleeping well . 08/21/19 . Patient states that she has been eating a little more during mothers day and her sugar was a little up around 144 . Thins morning it was around 84 . She states that she is still exercising and drinking water . Advised patient to continue her efforts   Patient Self Care Activities:  . UNABLE to independently self manage diabaetes . Self administers medications as prescribed . Attends all scheduled provider appointments  Please see past updates related to this goal by clicking on the "Past Updates" button in the selected goal      . I check my weight daily (pt-stated)       [place] (see longtitudinal plan of care for additional care plan information)   Current Barriers:  [name] Knowledge deficit related to basic heart failure pathophysiology and self care management  Case Manager Clinical Goal(s):  [name] Over the next 90 days, patient will weigh self daily and record  Interventions:  . Provided verbal education on  low sodium diet . Reviewed Heart Failure Action Plan in depth  . Advised patient to weigh each morning after emptying bladder . Discussed importance of daily weight and advised patient to weigh and record daily . Patient stated her weight was 185 lbs . She denied cp, swelling , shob . She states that she does not use salt . She states that she drinks plenty of water . 08/08/19 . The  patient's weight today is 186.4 . She denies any chest pain , shob, or swelling  . The nurse is coming to the home 1 time /weeks for the next 2 weeks . 08/21/19 . Patient states that her weight today was 186.2  . She is staying constant with her weight  . She denies any chest pain shortness of breath or swelling  Patient Self Care Activities:  [name] understanding of and follows CHF Action Plan  Please see past updates related to this goal by clicking on the "Past Updates" button in the selected goal          The care management team will reach out to the patient again over the next 14 days.  The patient has been provided with contact information for the care management team and has been advised to call with any health related questions or concerns.   [name] RN, BSN, [facility] Care Management Coordinator [place] Phone: [phone] Fax: [phone]

## 2019-08-27 ENCOUNTER — Other Ambulatory Visit: Payer: Self-pay | Admitting: *Deleted

## 2019-08-27 DIAGNOSIS — M810 Age-related osteoporosis without current pathological fracture: Secondary | ICD-10-CM

## 2019-08-27 MED ORDER — ALENDRONATE SODIUM 70 MG PO TABS: ORAL_TABLET | ORAL | 0 refills | Status: DC

## 2019-08-28 DIAGNOSIS — I110 Hypertensive heart disease with heart failure: Secondary | ICD-10-CM | POA: Diagnosis not present

## 2019-08-28 DIAGNOSIS — M6281 Muscle weakness (generalized): Secondary | ICD-10-CM | POA: Diagnosis not present

## 2019-08-28 DIAGNOSIS — M25562 Pain in left knee: Secondary | ICD-10-CM | POA: Diagnosis not present

## 2019-08-28 DIAGNOSIS — Z7982 Long term (current) use of aspirin: Secondary | ICD-10-CM | POA: Diagnosis not present

## 2019-08-28 DIAGNOSIS — M25561 Pain in right knee: Secondary | ICD-10-CM | POA: Diagnosis not present

## 2019-08-28 DIAGNOSIS — Z7984 Long term (current) use of oral hypoglycemic drugs: Secondary | ICD-10-CM | POA: Diagnosis not present

## 2019-08-28 DIAGNOSIS — I5032 Chronic diastolic (congestive) heart failure: Secondary | ICD-10-CM | POA: Diagnosis not present

## 2019-08-28 DIAGNOSIS — E785 Hyperlipidemia, unspecified: Secondary | ICD-10-CM | POA: Diagnosis not present

## 2019-08-28 DIAGNOSIS — J449 Chronic obstructive pulmonary disease, unspecified: Secondary | ICD-10-CM | POA: Diagnosis not present

## 2019-08-28 DIAGNOSIS — E559 Vitamin D deficiency, unspecified: Secondary | ICD-10-CM | POA: Diagnosis not present

## 2019-08-28 DIAGNOSIS — G8929 Other chronic pain: Secondary | ICD-10-CM | POA: Diagnosis not present

## 2019-08-28 DIAGNOSIS — E1169 Type 2 diabetes mellitus with other specified complication: Secondary | ICD-10-CM | POA: Diagnosis not present

## 2019-08-30 ENCOUNTER — Other Ambulatory Visit (HOSPITAL_COMMUNITY)
Admission: RE | Admit: 2019-08-30 | Discharge: 2019-08-30 | Disposition: A | Discharge status: Home / Self Care | Payer: Medicare Other | Source: Ambulatory Visit | Attending: Internal Medicine | Admitting: Internal Medicine

## 2019-08-30 DIAGNOSIS — Z01812 Encounter for preprocedural laboratory examination: Secondary | ICD-10-CM | POA: Insufficient documentation

## 2019-08-30 DIAGNOSIS — Z20822 Contact with and (suspected) exposure to covid-19: Secondary | ICD-10-CM | POA: Diagnosis not present

## 2019-08-30 LAB — SARS CORONAVIRUS 2 (TAT 6-24 HRS): SARS Coronavirus 2: NEGATIVE

## 2019-09-03 ENCOUNTER — Other Ambulatory Visit: Payer: Self-pay

## 2019-09-03 ENCOUNTER — Ambulatory Visit (HOSPITAL_BASED_OUTPATIENT_CLINIC_OR_DEPARTMENT_OTHER): Payer: Medicare Other | Attending: Internal Medicine | Admitting: Internal Medicine

## 2019-09-03 VITALS — Temp 97.9°F | Ht 60.0 in | Wt 192.0 lb

## 2019-09-03 DIAGNOSIS — G4733 Obstructive sleep apnea (adult) (pediatric): Secondary | ICD-10-CM | POA: Diagnosis not present

## 2019-09-04 ENCOUNTER — Ambulatory Visit (INDEPENDENT_AMBULATORY_CARE_PROVIDER_SITE_OTHER): Payer: Medicare Other | Admitting: Internal Medicine

## 2019-09-04 ENCOUNTER — Ambulatory Visit: Payer: Medicare Other

## 2019-09-04 DIAGNOSIS — J4541 Moderate persistent asthma with (acute) exacerbation: Secondary | ICD-10-CM | POA: Diagnosis not present

## 2019-09-04 LAB — PULMONARY FUNCTION TEST
DL/VA % pred: 94 %
DL/VA: 4.04 ml/min/mmHg/L
DLCO cor % pred: 77 %
DLCO cor: 12.56 ml/min/mmHg
DLCO unc % pred: 77 %
DLCO unc: 12.56 ml/min/mmHg
FEF 25-75 Post: 2.38 L/sec
FEF 25-75 Pre: 2.08 L/sec
FEF2575-%Change-Post: 14 %
FEF2575-%Pred-Post: 186 %
FEF2575-%Pred-Pre: 162 %
FEV1-%Change-Post: 4 %
FEV1-%Pred-Post: 129 %
FEV1-%Pred-Pre: 123 %
FEV1-Post: 1.75 L
FEV1-Pre: 1.67 L
FEV1FVC-%Change-Post: 3 %
FEV1FVC-%Pred-Pre: 110 %
FEV6-%Change-Post: 0 %
FEV6-%Pred-Post: 118 %
FEV6-%Pred-Pre: 117 %
FEV6-Post: 1.99 L
FEV6-Pre: 1.97 L
FEV6FVC-%Pred-Post: 105 %
FEV6FVC-%Pred-Pre: 105 %
FVC-%Change-Post: 0 %
FVC-%Pred-Post: 112 %
FVC-%Pred-Pre: 111 %
FVC-Post: 1.99 L
FVC-Pre: 1.97 L
Post FEV1/FVC ratio: 88 %
Post FEV6/FVC ratio: 100 %
Pre FEV1/FVC ratio: 85 %
Pre FEV6/FVC Ratio: 100 %

## 2019-09-04 NOTE — Progress Notes (Signed)
PFT done today. 

## 2019-09-04 NOTE — Patient Instructions (Signed)
Visit Information  Goals Addressed            This Visit's Progress   . I check my blood sugars daily" (pt-stated)       CARE PLAN ENTRY (see longtitudinal plan of care for additional care plan information)  Objective:  Lab Results  Component Value Date   HGBA1C 6.8 06/05/2019 .   Lab Results  Component Value Date   CREATININE 0.88 06/05/2019   CREATININE 0.85 03/07/2019   CREATININE 0.76 05/10/2017 .   [name] No results found for: EGFR  Current Barriers:  [name] Knowledge Deficits related to basic Diabetes pathophysiology and self care/management  Case Manager Clinical Goal(s):  Over the next 90 days, patient will maintain adherence to prescribed treatment plan for diabetes self care/management as evidenced by: keeping a1c below 7.0  Interventions:  . Provided education to patient about basic DM disease process . Reviewed medications with patient and discussed importance of medication adherence . Discussed plans with patient for ongoing care management follow up and provided patient with direct contact information for care management team . Provided patient with written educational materials related to hypo and and importance of correct treatment- she knows to eat 6 pieces of candy or drink orange juice  or something with sugar to raise her blood sugar and recheck in 15 minutes . Advised patient, providing education and rationale, to check cbg daily and record, calling the office for findings outside established parameters.   . 5/24/241 . The patient states that due to her having test done last night and this morning it threw her schedule off and she has not checked her blood sugar today not her blood pressure.  She was unable to give me the readings for the past few days because she was outside sitting enjoying the sunshine. Advised patient that I understood that her routine can be thrown off but to continue to check and write down her values as she had been doing before and she stated  that she would.    Patient Self Care Activities:  . UNABLE to independently self manage diabaetes . Self administers medications as prescribed . Attends all scheduled provider appointments  Please see past updates related to this goal by clicking on the "Past Updates" button in the selected goal      . I check my weight daily (pt-stated)       [place] (see longtitudinal plan of care for additional care plan information)   Current Barriers:  [name] Knowledge deficit related to basic heart failure pathophysiology and self care management  Case Manager Clinical Goal(s):  [name] Over the next 90 days, patient will weigh self daily and record  Interventions:  . Provided verbal education on low sodium diet . Reviewed Heart Failure Action Plan in depth  . Advised patient to weigh each morning after emptying bladder . Discussed importance of daily weight and advised patient to weigh and record daily . Patient stated her weight was 185 lbs . She denied cp, swelling , shob . She states that she does not use salt . She states that she drinks plenty of water . Verbalizes understanding of and follows CHF Action Plan . 09/04/19 . Patient states that her weight today is 198 lbs. . She states that she feels good and denies any chest pain shob or swelling . She states that she had a PF done today and a sleep study done last  night.  Please see past updates related to this goal  by clicking on the "Past Updates" button in the selected goal         Ms. Kaufmann was given information about Care Management services today including:  1. Care Management services include personalized support from designated clinical staff supervised by her physician, including individualized plan of care and coordination with other care providers 2. 24/7 contact phone numbers for assistance for urgent and routine care needs. 3. The patient may stop CCM services at any time (effective at the end of the month) by phone  call to the office staff.  Patient agreed to services and verbal consent obtained.   The patient verbalized understanding of instructions provided today and declined a print copy of patient instruction materials.   The care management team will reach out to the patient again over the next 14 days.  The patient has been provided with contact information for the care management team and has been advised to call with any health related questions or concerns.   [name] RN, BSN, [facility] Care Management Coordinator [place] Phone: [phone] Fax: [phone]

## 2019-09-04 NOTE — Chronic Care Management (AMB) (Signed)
Care Management   Follow Up Note   09/04/2019 Name: Desiree Parker MRN: [id number] DOB: Sep 10, 1945  Referred by: [name], MD Reason for referral : Care Coordination (Care Management RNCM HTN/DM II/ CHF)   Desiree Parker is a 74 y.o. year old female who is a primary care patient of [name], MD. The care management team was consulted for assistance with care management and care coordination needs.    Review of patient status, including review of consultants reports, relevant laboratory and other test results, and collaboration with appropriate care team members and the patient's provider was performed as part of comprehensive patient evaluation and provision of chronic care management services.    SDOH (Social Determinants of Health) assessments performed: No See Care Plan activities for detailed interventions related to [facility])     Advanced Directives: See Care Plan and Vynca application for related entries.   Goals Addressed            This Visit's Progress   . I check my blood sugars daily" (pt-stated)       CARE PLAN ENTRY (see longtitudinal plan of care for additional care plan information)  Objective:  Lab Results  Component Value Date   HGBA1C 6.8 06/05/2019 .   Lab Results  Component Value Date   CREATININE 0.88 06/05/2019   CREATININE 0.85 03/07/2019   CREATININE 0.76 05/10/2017 .   [name] No results found for: EGFR  Current Barriers:  [name] Knowledge Deficits related to basic Diabetes pathophysiology and self care/management  Case Manager Clinical Goal(s):  Over the next 90 days, patient will maintain adherence to prescribed treatment plan for diabetes self care/management as evidenced by: keeping a1c below 7.0  Interventions:  . Provided education to patient about basic DM disease process . Reviewed medications with patient and discussed importance of medication adherence . Discussed plans with patient for ongoing care management follow up and  provided patient with direct contact information for care management team . Provided patient with written educational materials related to hypo and and importance of correct treatment- she knows to eat 6 pieces of candy or drink orange juice  or something with sugar to raise her blood sugar and recheck in 15 minutes . Advised patient, providing education and rationale, to check cbg daily and record, calling the office for findings outside established parameters.   . 5/24/241 . The patient states that due to her having test done last night and this morning it threw her schedule off and she has not checked her blood sugar today not her blood pressure.  She was unable to give me the readings for the past few days because she was outside sitting enjoying the sunshine. Advised patient that I understood that her routine can be thrown off but to continue to check and write down her values as she had been doing before and she stated that she would.    Patient Self Care Activities:  . UNABLE to independently self manage diabaetes . Self administers medications as prescribed . Attends all scheduled provider appointments  Please see past updates related to this goal by clicking on the "Past Updates" button in the selected goal      . I check my weight daily (pt-stated)       [place] (see longtitudinal plan of care for additional care plan information)   Current Barriers:  [name] Knowledge deficit related to basic heart failure pathophysiology and self care management  Case Manager Clinical Goal(s):  .  Over the next 90 days, patient will weigh self daily and record  Interventions:  . Provided verbal education on low sodium diet . Reviewed Heart Failure Action Plan in depth  . Advised patient to weigh each morning after emptying bladder . Discussed importance of daily weight and advised patient to weigh and record daily . Patient stated her weight was 185 lbs . She denied cp, swelling ,  shob . She states that she does not use salt . She states that she drinks plenty of water . Verbalizes understanding of and follows CHF Action Plan . 09/04/19 . Patient states that her weight today is 198 lbs. . She states that she feels good and denies any chest pain shob or swelling . She states that she had a PF done today and a sleep study done last  night.  Please see past updates related to this goal by clicking on the "Past Updates" button in the selected goal          The care management team will reach out to the patient again over the next 14 days.   [name] RN, BSN, [facility] Care Management Coordinator [place] Phone: [phone] Fax: [phone]

## 2019-09-11 ENCOUNTER — Other Ambulatory Visit: Payer: Self-pay | Admitting: *Deleted

## 2019-09-11 DIAGNOSIS — J449 Chronic obstructive pulmonary disease, unspecified: Secondary | ICD-10-CM

## 2019-09-11 MED ORDER — ALBUTEROL SULFATE HFA 108 (90 BASE) MCG/ACT IN AERS: 2.0000 | INHALATION_SPRAY | Freq: Four times a day (QID) | RESPIRATORY_TRACT | 6 refills | Status: DC | PRN

## 2019-09-12 NOTE — Procedures (Signed)
Patient Name: Desiree Parker, Desiree Parker Date: 09/03/2019 Gender: Female D.O.B: 13-Mar-1946 Age (years): 26 Referring Provider: Ina Homes MD Height (inches): 55 Interpreting Physician: Baird Lyons MD, ABSM Weight (lbs): 192 RPSGT: Carolin Coy BMI: 72 MRN: [id number] Neck Size: 18.00  CLINICAL INFORMATION Sleep Study Type: NPSG Indication for sleep study: COPD, Diabetes, Hypertension, Obesity, Snoring Epworth Sleepiness Score: 6  SLEEP STUDY TECHNIQUE As per the AASM Manual for the Scoring of Sleep and Associated Events v2.3 (April 2016) with a hypopnea requiring 4% desaturations.  The channels recorded and monitored were frontal, central and occipital EEG, electrooculogram (EOG), submentalis EMG (chin), nasal and oral airflow, thoracic and abdominal wall motion, anterior tibialis EMG, snore microphone, electrocardiogram, and pulse oximetry.  MEDICATIONS Medications self-administered by patient taken the night of the study : none reported  SLEEP ARCHITECTURE The study was initiated at 10:25:19 PM and ended at 5:06:52 AM.  Sleep onset time was 9.2 minutes and the sleep efficiency was 79.6%. The total sleep time was 319.8 minutes.  Stage REM latency was 138.0 minutes.  The patient spent 13.76% of the night in stage N1 sleep, 68.42% in stage N2 sleep, 6.10% in stage N3 and 11.7% in REM.  Alpha intrusion was absent.  Supine sleep was 41.61%.  RESPIRATORY PARAMETERS The overall apnea/hypopnea index (AHI) was 8.8 per hour. There were 43 total apneas, including 43 obstructive, 0 central and 0 mixed apneas. There were 4 hypopneas and 8 RERAs.  The AHI during Stage REM sleep was 59.2 per hour.  AHI while supine was 6.8 per hour.  The mean oxygen saturation was 94.89%. The minimum SpO2 during sleep was 86.00%.  soft snoring was noted during this study.  CARDIAC DATA The 2 lead EKG demonstrated sinus rhythm. The mean heart rate was 67.90 beats per minute. Other EKG  findings include: PVCs.  LEG MOVEMENT DATA The total PLMS were 4 with a resulting PLMS index of 0.75. Associated arousal with leg movement index was 0.2 .  IMPRESSIONS - Mild obstructive sleep apnea occurred during this study (AHI = 8.8/h). - No significant central sleep apnea occurred during this study (CAI = 0.0/h). - Oxygen desaturation was noted during this study (Min O2 = 86.00%). Mean sat 94.89%. - Time with O2 saturation 88% or less was 0.9 minutes. - The patient snored with soft snoring volume. - EKG findings include PVCs. - Clinically significant periodic limb movements did not occur during sleep. No significant associated arousals.  DIAGNOSIS - Obstructive Sleep Apnea (327.23 [G47.33 ICD-10])  RECOMMENDATIONS - Treatment for mild OSA is directed at symptoms. Conservative measures may include observation, weight loss and sleep position off back. - Other options, including CPAP, a fitted oral appliance or ENT evaluation, would be based on clinical judgment. - Be careful with alcohol, sedatives and other CNS depressants that may worsen sleep apnea and disrupt normal sleep architecture. - Sleep hygiene should be reviewed to assess factors that may improve sleep quality. - Weight management and regular exercise should be initiated or continued if appropriate.  [Electronically signed] 09/12/2019 03:37 PM  Baird Lyons MD, [place], American Board of Sleep Medicine   NPI: [id number]                         [place], [place] [facility]  ELECTRONICALLY SIGNED ON:  09/12/2019, 3:33 PM [place] PH: [phone]   FX: [phone] [place]

## 2019-09-14 ENCOUNTER — Other Ambulatory Visit: Payer: Self-pay

## 2019-09-14 ENCOUNTER — Ambulatory Visit (INDEPENDENT_AMBULATORY_CARE_PROVIDER_SITE_OTHER): Payer: Medicare Other | Admitting: Family Medicine

## 2019-09-14 ENCOUNTER — Encounter: Payer: Self-pay | Admitting: Family Medicine

## 2019-09-14 VITALS — BP 112/72 | HR 73 | Wt 190.6 lb

## 2019-09-14 DIAGNOSIS — I5032 Chronic diastolic (congestive) heart failure: Secondary | ICD-10-CM

## 2019-09-14 DIAGNOSIS — E2839 Other primary ovarian failure: Secondary | ICD-10-CM

## 2019-09-14 DIAGNOSIS — E559 Vitamin D deficiency, unspecified: Secondary | ICD-10-CM | POA: Diagnosis not present

## 2019-09-14 DIAGNOSIS — M810 Age-related osteoporosis without current pathological fracture: Secondary | ICD-10-CM

## 2019-09-14 DIAGNOSIS — E119 Type 2 diabetes mellitus without complications: Secondary | ICD-10-CM | POA: Diagnosis not present

## 2019-09-14 DIAGNOSIS — J449 Chronic obstructive pulmonary disease, unspecified: Secondary | ICD-10-CM | POA: Diagnosis not present

## 2019-09-14 LAB — POCT GLYCOSYLATED HEMOGLOBIN (HGB A1C): HbA1c, POC (controlled diabetic range): 5.7 % (ref 0.0–7.0)

## 2019-09-14 MED ORDER — METFORMIN HCL ER 500 MG PO TB24: 500.0000 mg | ORAL_TABLET | ORAL | 1 refills | Status: DC

## 2019-09-14 NOTE — Assessment & Plan Note (Signed)
Improved on breo Ellipta and Spiriva. I reviewed recent PFT report. However, the report is incomplete. I will await final report from the pulmonologist once it is read. Previous PFT in 2019 showed a near normal result. Sleep study reviewed: Mild OSA, defer sleep machine for now and will trial weight management. Continue current regimen and f/u pulm as planned.

## 2019-09-14 NOTE — Assessment & Plan Note (Signed)
I checked vit D level today.

## 2019-09-14 NOTE — Assessment & Plan Note (Signed)
Repeat bone scan 2 yrs ago showed osteopenia, previously it was osteoporosis. Repeat Dexa scan ordered. I gave her and her son instruction on scheduling. I will contact them with the result soon. If normal, we can consider Bisphosphonate holiday. They agreed with the plan. Continue current regimen, including calcium and vitamin D.

## 2019-09-14 NOTE — Progress Notes (Signed)
    SUBJECTIVE:   CHIEF COMPLAINT / HPI:  Osteoporosis:She is compliant with Fosamax weekly as well as with Vitamin D and Calcium. She is here for f/u.  COPD/SOB: Currently improved a lot on Breo Ellipta and Spiriva. She uses albuterol on and off, had not needed to use it often.   DM2: She is compliant with Metformin XR 500 mg Qd. 89 this AM. Home CBGs between 90-149. No hypoglycemic episode.  Vitamin D deficiency: Compliant with weekly supplement.   PERTINENT  PMH / PSH: PMX reviewed  OBJECTIVE:   BP 112/72   Pulse 73   Wt 190 lb 9.6 oz (86.5 kg)   SpO2 97%   BMI 37.22 kg/m   Physical Exam Vitals and nursing note reviewed.  Cardiovascular:     Rate and Rhythm: Normal rate and regular rhythm.     Heart sounds: Normal heart sounds. No murmur.  Pulmonary:     Effort: Pulmonary effort is normal. No respiratory distress.     Breath sounds: Normal breath sounds. No wheezing.  Abdominal:     General: Abdomen is flat. Bowel sounds are normal. There is no distension.     Palpations: Abdomen is soft. There is no mass.     Tenderness: There is no abdominal tenderness.  Musculoskeletal:     Right lower leg: No edema.     Left lower leg: No edema.      ASSESSMENT/PLAN:   Osteoporosis Repeat bone scan 2 yrs ago showed osteopenia, previously it was osteoporosis. Repeat Dexa scan ordered. I gave her and her son instruction on scheduling. I will contact them with the result soon. If normal, we can consider Bisphosphonate holiday. They agreed with the plan. Continue current regimen, including calcium and vitamin D.  COPD (chronic obstructive pulmonary disease) (HCC) Improved on breo Ellipta and Spiriva. I reviewed recent PFT report. However, the report is incomplete. I will await final report from the pulmonologist once it is read. Previous PFT in 2019 showed a near normal result. Sleep study reviewed: Mild OSA, defer sleep machine for now and will trial weight  management. Continue current regimen and f/u pulm as planned.   Chronic diastolic heart failure (HCC) ECHO: EF 55-60% with G1DD. Stable.  Monitor for now.  Diabetes mellitus, type II Her son and her daughter inlaw now help with meal prep and ensures healthy diet. A1C checked in the office today was 5.7 Plan to cut back on metformin vs d/c all together. She prefers to cut back instead. Take Metformin QOD. Refills given.  Vitamin D deficiency I checked vit D level today.     Andrena Mews, MD [place]

## 2019-09-14 NOTE — Assessment & Plan Note (Signed)
Her son and her daughter inlaw now help with meal prep and ensures healthy diet. A1C checked in the office today was 5.7 Plan to cut back on metformin vs d/c all together. She prefers to cut back instead. Take Metformin QOD. Refills given.

## 2019-09-14 NOTE — Patient Instructions (Signed)
Bone Density Test The bone density test uses a special type of X-ray to measure the amount of calcium and other minerals in your bones. It can measure bone density in the hip and the spine. The test procedure is similar to having a regular X-ray. This test may also be called:  Bone densitometry.  Bone mineral density test.  Dual-energy X-ray absorptiometry (DEXA). You may have this test to:  Diagnose a condition that causes weak or thin bones (osteoporosis).  Screen you for osteoporosis.  Predict your risk for a broken bone (fracture).  Determine how well your osteoporosis treatment is working. Tell a health care provider about:  Any allergies you have.  All medicines you are taking, including vitamins, herbs, eye drops, creams, and over-the-counter medicines.  Any problems you or family members have had with anesthetic medicines.  Any blood disorders you have.  Any surgeries you have had.  Any medical conditions you have.  Whether you are pregnant or may be pregnant.  Any medical tests you have had within the past 14 days that used contrast material. What are the risks? Generally, this is a safe procedure. However, it does expose you to a small amount of radiation, which can slightly increase your cancer risk. What happens before the procedure?  Do not take any calcium supplements starting 24 hours before your test.  Remove all metal jewelry, eyeglasses, dental appliances, and any other metal objects. What happens during the procedure?   You will lie down on an exam table. There will be an X-ray generator below you and an imaging device above you.  Other devices, such as boxes or braces, may be used to position your body properly for the scan.  The machine will slowly scan your body. You will need to keep still.  The images will show up on a screen in the room. Images will be examined by a specialist after your test is done. The procedure may vary among health care  providers and hospitals. What happens after the procedure?  It is up to you to get your test results. Ask your health care provider, or the department that is doing the test, when your results will be ready. Summary  A bone density test is an imaging test that uses a type of X-ray to measure the amount of calcium and other minerals in your bones.  The test may be used to diagnose or screen you for a condition that causes weak or thin bones (osteoporosis), predict your risk for a broken bone (fracture), or determine how well your osteoporosis treatment is working.  Do not take any calcium supplements starting 24 hours before your test.  Ask your health care provider, or the department that is doing the test, when your results will be ready. This information is not intended to replace advice given to you by your health care provider. Make sure you discuss any questions you have with your health care provider. Document Revised: 04/14/2017 Document Reviewed: 01/31/2017 Elsevier Patient Education  [place].

## 2019-09-14 NOTE — Assessment & Plan Note (Signed)
ECHO: EF 55-60% with G1DD. Stable.  Monitor for now.

## 2019-09-15 LAB — VITAMIN D 25 HYDROXY (VIT D DEFICIENCY, FRACTURES): Vit D, 25-Hydroxy: 62.5 ng/mL (ref 30.0–100.0)

## 2019-09-17 ENCOUNTER — Telehealth: Payer: Self-pay | Admitting: Family Medicine

## 2019-09-17 MED ORDER — VITAMIN D 50 MCG (2000 UT) PO TABS: 2000.0000 [IU] | ORAL_TABLET | Freq: Every day | ORAL | 1 refills | Status: DC

## 2019-09-17 NOTE — Telephone Encounter (Signed)
I called and discussed her vitamin D level with her and her son on separate times.  Level is back to normal.  May d/c Vitamin D 50000 IU weekly supplement and start OTC vitamin D 2000 or 3000 IU one tab daily.  Both her and her son verbalized understanding.

## 2019-09-18 ENCOUNTER — Other Ambulatory Visit: Payer: Self-pay

## 2019-09-18 ENCOUNTER — Ambulatory Visit: Payer: Medicare Other

## 2019-09-18 NOTE — Chronic Care Management (AMB) (Signed)
Care Management   Follow Up Note   09/18/2019 Name: Desiree Parker MRN: [id number] DOB: April 09, 1946  Referred by: [name], MD Reason for referral : Care Coordination (Care Management RNCM DM II/HTN/ CHF)   Desiree Parker is a 74 y.o. year old female who is a primary care patient of [name], MD. The care management team was consulted for assistance with care management and care coordination needs.    Review of patient status, including review of consultants reports, relevant laboratory and other test results, and collaboration with appropriate care team members and the patient's provider was performed as part of comprehensive patient evaluation and provision of chronic care management services.    SDOH (Social Determinants of Health) assessments performed: No See Care Plan activities for detailed interventions related to [facility])     Advanced Directives: See Care Plan and Vynca application for related entries.   Goals Addressed            This Visit's Progress   . I check my blood sugars daily" (pt-stated)       CARE PLAN ENTRY (see longtitudinal plan of care for additional care plan information)  Objective:  Lab Results  Component Value Date   HGBA1C 6.8 06/05/2019 .   Lab Results  Component Value Date   CREATININE 0.88 06/05/2019   CREATININE 0.85 03/07/2019   CREATININE 0.76 05/10/2017 .   [name] No results found for: EGFR  Current Barriers:  [name] Knowledge Deficits related to basic Diabetes pathophysiology and self care/management  Case Manager Clinical Goal(s):  Over the next 90 days, patient will maintain adherence to prescribed treatment plan for diabetes self care/management as evidenced by: keeping a1c below 7.0  Interventions:  . Provided education to patient about basic DM disease process . Reviewed medications with patient and discussed importance of medication adherence . Discussed plans with patient for ongoing care management follow up and  provided patient with direct contact information for care management team . Provided patient with written educational materials related to hypo and and importance of correct treatment- she knows to eat 6 pieces of candy or drink orange juice  or something with sugar to raise her blood sugar and recheck in 15 minutes . Advised patient, providing education and rationale, to check cbg daily and record, calling the office for findings outside established parameters.   . 09/18/19 . The patient states that her FBS today was 73. . She states that she is eating well and walking . She states that she is now taking her metformin every other day.  Her a1c was 5.7 . She took her temperature and it was 97.8 . She was in a good mood and enjoying life.  Patient Self Care Activities:  . UNABLE to independently self manage diabaetes . Self administers medications as prescribed . Attends all scheduled provider appointments  Please see past updates related to this goal by clicking on the "Past Updates" button in the selected goal      . I check my weight daily (pt-stated)       [place] (see longtitudinal plan of care for additional care plan information)   Current Barriers:  [name] Knowledge deficit related to basic heart failure pathophysiology and self care management  Case Manager Clinical Goal(s):  [name] Over the next 90 days, patient will weigh self daily and record  Interventions:  . Provided verbal education on low sodium diet . Reviewed Heart Failure Action Plan in depth  .  Advised patient to weigh each morning after emptying bladder . Discussed importance of daily weight and advised patient to weigh and record daily . Patient stated her weight was 185 lbs . She denied cp, swelling , shob . She states that she does not use salt . She states that she drinks plenty of water . Verbalizes understanding of and follows CHF Action Plan . 6/8//21 . Patient states that her weight today is 198  lbs. . She states that she feels good and denies any chest pain shob or swelling . She states that she is taking her meds as prescribed . Her son and daughter in law are fixing her medications for her  Please see past updates related to this goal by clicking on the "Past Updates" button in the selected goal          The care management team will reach out to the patient again over the next 14 days.  The patient has been provided with contact information for the care management team and has been advised to call with any health related questions or concerns.   [name] RN, BSN, [facility] Care Management Coordinator [place] Phone: [phone] Fax: [phone]

## 2019-09-18 NOTE — Patient Instructions (Signed)
Visit Information  Goals Addressed            This Visit's Progress   . I check my blood sugars daily" (pt-stated)       CARE PLAN ENTRY (see longtitudinal plan of care for additional care plan information)  Objective:  Lab Results  Component Value Date   HGBA1C 6.8 06/05/2019 .   Lab Results  Component Value Date   CREATININE 0.88 06/05/2019   CREATININE 0.85 03/07/2019   CREATININE 0.76 05/10/2017 .   [name] No results found for: EGFR  Current Barriers:  [name] Knowledge Deficits related to basic Diabetes pathophysiology and self care/management  Case Manager Clinical Goal(s):  Over the next 90 days, patient will maintain adherence to prescribed treatment plan for diabetes self care/management as evidenced by: keeping a1c below 7.0  Interventions:  . Provided education to patient about basic DM disease process . Reviewed medications with patient and discussed importance of medication adherence . Discussed plans with patient for ongoing care management follow up and provided patient with direct contact information for care management team . Provided patient with written educational materials related to hypo and and importance of correct treatment- she knows to eat 6 pieces of candy or drink orange juice  or something with sugar to raise her blood sugar and recheck in 15 minutes . Advised patient, providing education and rationale, to check cbg daily and record, calling the office for findings outside established parameters.   . 09/18/19 . The patient states that her FBS today was 73. . She states that she is eating well and walking . She states that she is now taking her metformin every other day.  Her a1c was 5.7 . She took her temperature and it was 97.8 . She was in a good mood and enjoying life.  Patient Self Care Activities:  . UNABLE to independently self manage diabaetes . Self administers medications as prescribed . Attends all scheduled provider appointments  Please see  past updates related to this goal by clicking on the "Past Updates" button in the selected goal      . I check my weight daily (pt-stated)       [place] (see longtitudinal plan of care for additional care plan information)   Current Barriers:  [name] Knowledge deficit related to basic heart failure pathophysiology and self care management  Case Manager Clinical Goal(s):  [name] Over the next 90 days, patient will weigh self daily and record  Interventions:  . Provided verbal education on low sodium diet . Reviewed Heart Failure Action Plan in depth  . Advised patient to weigh each morning after emptying bladder . Discussed importance of daily weight and advised patient to weigh and record daily . Patient stated her weight was 185 lbs . She denied cp, swelling , shob . She states that she does not use salt . She states that she drinks plenty of water . Verbalizes understanding of and follows CHF Action Plan . 6/8//21 . Patient states that her weight today is 198 lbs. . She states that she feels good and denies any chest pain shob or swelling . She states that she is taking her meds as prescribed . Her son and daughter in law are fixing her medications for her  Please see past updates related to this goal by clicking on the "Past Updates" button in the selected goal         Ms. Rolland was given information about Care Management services today  including:  1. Care Management services include personalized support from designated clinical staff supervised by her physician, including individualized plan of care and coordination with other care providers 2. 24/7 contact phone numbers for assistance for urgent and routine care needs. 3. The patient may stop CCM services at any time (effective at the end of the month) by phone call to the office staff.  Patient agreed to services and verbal consent obtained.   The patient verbalized understanding of instructions provided today and  declined a print copy of patient instruction materials.   The care management team will reach out to the patient again over the next 14 days.  The patient has been provided with contact information for the care management team and has been advised to call with any health related questions or concerns.   [name] RN, BSN, [facility] Care Management Coordinator [place] Phone: [phone] Fax: [phone]

## 2019-10-02 ENCOUNTER — Ambulatory Visit: Payer: Medicare Other

## 2019-10-02 ENCOUNTER — Other Ambulatory Visit: Payer: Self-pay

## 2019-10-02 NOTE — Chronic Care Management (AMB) (Signed)
  Care Management   Outreach Note  10/02/2019 Name: Desiree Parker MRN: [id number] DOB: 05-13-1945  Referred by: [name], MD Reason for referral : Care Coordination (Care Management RNCM DM II/ HTN.CHF)   An unsuccessful telephone outreach was attempted today. The patient was referred to the case management team for assistance with care management and care coordination.   Follow Up Plan: The care management team will reach out to the patient again over the next 5-7 days.    Unable to leave a  Message no voicemail pickup.    [name] RN, BSN, [facility] Care Management Coordinator [place] Phone: [phone] Fax: [phone]

## 2019-10-03 ENCOUNTER — Other Ambulatory Visit: Payer: Self-pay | Admitting: *Deleted

## 2019-10-03 MED ORDER — VITAMIN D 50 MCG (2000 UT) PO TABS: 2000.0000 [IU] | ORAL_TABLET | Freq: Every day | ORAL | 1 refills | Status: DC

## 2019-10-03 NOTE — Addendum Note (Signed)
Addended by: Valerie Roys on: 10/03/2019 04:12 PM   Modules accepted: Orders

## 2019-10-03 NOTE — Telephone Encounter (Signed)
Sent script as "normal", it was originally approved for no print.  Tashawn Laswell,CMA

## 2019-10-10 ENCOUNTER — Ambulatory Visit: Payer: Medicare Other

## 2019-10-10 ENCOUNTER — Other Ambulatory Visit: Payer: Self-pay

## 2019-10-10 NOTE — Patient Instructions (Signed)
Visit Information  Goals Addressed              This Visit's Progress   .  I check my blood sugars daily" (pt-stated)        CARE PLAN ENTRY (see longtitudinal plan of care for additional care plan information)  Objective:  Lab Results  Component Value Date   HGBA1C 6.8 06/05/2019 .   Lab Results  Component Value Date   CREATININE 0.88 06/05/2019   CREATININE 0.85 03/07/2019   CREATININE 0.76 05/10/2017 .   [name] No results found for: EGFR  Current Barriers:  [name] Knowledge Deficits related to basic Diabetes pathophysiology and self care/management  Case Manager Clinical Goal(s):  Over the next 90 days, patient will maintain adherence to prescribed treatment plan for diabetes self care/management as evidenced by: keeping a1c below 7.0  Interventions:  . Provided education to patient about basic DM disease process . Reviewed medications with patient and discussed importance of medication adherence . Discussed plans with patient for ongoing care management follow up and provided patient with direct contact information for care management team . Provided patient with written educational materials related to hypo and and importance of correct treatment- she knows to eat 6 pieces of candy or drink orange juice  or something with sugar to raise her blood sugar and recheck in 15 minutes . Advised patient, providing education and rationale, to check cbg daily and record, calling the office for findings outside established parameters.   . 10/10/19 . The patient states that her FBS today was 97 . She states that she is eating well and walking.  She goes to [employer] for the [employer] program and eat lunch . She states that she is now taking her metformin every other day.  She took her temperature and it was 98.4  Patient Self Care Activities:  . UNABLE to independently self manage diabaetes . Self administers medications as prescribed . Attends all scheduled provider  appointments  Please see past updates related to this goal by clicking on the "Past Updates" button in the selected goal      .  I check my weight daily (pt-stated)        Desiree Parker (see longtitudinal plan of care for additional care plan information)   Current Barriers:  [name] Knowledge deficit related to basic heart failure pathophysiology and self care management  Case Manager Clinical Goal(s):  [name] Over the next 90 days, patient will weigh self daily and record  Interventions:  . Provided verbal education on low sodium diet . Reviewed Heart Failure Action Plan in depth  . Advised patient to weigh each morning after emptying bladder . Discussed importance of daily weight and advised patient to weigh and record daily . Patient stated her weight was 185 lbs . She denied cp, swelling , shob . She states that she does not use salt . She states that she drinks plenty of water . Verbalizes understanding of and follows CHF Action Plan . 6/30///21 . Patient states that her weight today is 186.4 lbs. . She states that she feels good and denies any chest pain shob or swelling . She states that she is taking her meds as prescribed   Please see past updates related to this goal by clicking on the "Past Updates" button in the selected goal         Desiree Parker was given information about Care Management services today including:  1. Care Management services include  personalized support from designated clinical staff supervised by her physician, including individualized plan of care and coordination with other care providers 2. 24/7 contact phone numbers for assistance for urgent and routine care needs. 3. The patient may stop CCM services at any time (effective at the end of the month) by phone call to the office staff.  Patient agreed to services and verbal consent obtained.   The patient verbalized understanding of instructions provided today and declined a print copy of patient  instruction materials.   The care management team will reach out to the patient again over the next 14 days.  The patient has been provided with contact information for the care management team and has been advised to call with any health related questions or concerns.   [name] RN, BSN, [facility] Care Management Coordinator [place] Phone: [phone] Fax: [phone]

## 2019-10-10 NOTE — Chronic Care Management (AMB) (Signed)
Care Management   Follow Up Note   10/10/2019 Name: Desiree Parker MRN: [id number] DOB: [birth year]  Referred by: Desiree Feil, MD Reason for referral : Care Coordination (Care Managemen RNCM CHF DM II HTN)   Desiree Parker is a 74 y.o. year old female who is a primary care patient of Desiree Feil, MD. The care management team was consulted for assistance with care management and care coordination needs.    Review of patient status, including review of consultants reports, relevant laboratory and other test results, and collaboration with appropriate care team members and the patient's provider was performed as part of comprehensive patient evaluation and provision of chronic care management services.    SDOH (Social Determinants of Health) assessments performed: No See Care Plan activities for detailed interventions related to [facility])     Advanced Directives: See Care Plan and Vynca application for related entries.   Goals Addressed              This Visit's Progress   .  I check my blood sugars daily" (pt-stated)        CARE PLAN ENTRY (see longtitudinal plan of care for additional care plan information)  Objective:  Lab Results  Component Value Date   HGBA1C 6.8 06/05/2019 .   Lab Results  Component Value Date   CREATININE 0.88 06/05/2019   CREATININE 0.85 03/07/2019   CREATININE 0.76 05/10/2017 .   [name] No results found for: EGFR  Current Barriers:  [name] Knowledge Deficits related to basic Diabetes pathophysiology and self care/management  Case Manager Clinical Goal(s):  Over the next 90 days, patient will maintain adherence to prescribed treatment plan for diabetes self care/management as evidenced by: keeping a1c below 7.0  Interventions:  . Provided education to patient about basic DM disease process . Reviewed medications with patient and discussed importance of medication adherence . Discussed plans with patient for ongoing care management follow up and  provided patient with direct contact information for care management team . Provided patient with written educational materials related to hypo and and importance of correct treatment- she knows to eat 6 pieces of candy or drink orange juice  or something with sugar to raise her blood sugar and recheck in 15 minutes . Advised patient, providing education and rationale, to check cbg daily and record, calling the office for findings outside established parameters.   . 10/10/19 . The patient states that her FBS today was 97 . She states that she is eating well and walking.  She goes to [employer] for the [employer] program and eat lunch . She states that she is now taking her metformin every other day.  She took her temperature and it was 98.4  Patient Self Care Activities:  . UNABLE to independently self manage diabaetes . Self administers medications as prescribed . Attends all scheduled provider appointments  Please see past updates related to this goal by clicking on the "Past Updates" button in the selected goal      .  I check my weight daily (pt-stated)        [place] (see longtitudinal plan of care for additional care plan information)   Current Barriers:  [name] Knowledge deficit related to basic heart failure pathophysiology and self care management  Case Manager Clinical Goal(s):  [name] Over the next 90 days, patient will weigh self daily and record  Interventions:  . Provided verbal education on low sodium diet . Reviewed Heart Failure  Action Plan in depth  . Advised patient to weigh each morning after emptying bladder . Discussed importance of daily weight and advised patient to weigh and record daily . Patient stated her weight was 185 lbs . She denied cp, swelling , shob . She states that she does not use salt . She states that she drinks plenty of water . Verbalizes understanding of and follows CHF Action Plan . 6/30///21 . Patient states that her weight  today is 186.4 lbs. . She states that she feels good and denies any chest pain shob or swelling . She states that she is taking her meds as prescribed   Please see past updates related to this goal by clicking on the "Past Updates" button in the selected goal          The care management team will reach out to the patient again over the next 14 days.  The patient has been provided with contact information for the care management team and has been advised to call with any health related questions or concerns.   [name] RN, BSN, [facility] Care Management Coordinator [place] Phone: [phone] Fax: [phone]

## 2019-10-24 ENCOUNTER — Encounter: Payer: Self-pay | Admitting: Podiatry

## 2019-10-24 ENCOUNTER — Ambulatory Visit (INDEPENDENT_AMBULATORY_CARE_PROVIDER_SITE_OTHER): Payer: Medicare Other | Admitting: Podiatry

## 2019-10-24 ENCOUNTER — Other Ambulatory Visit: Payer: Self-pay

## 2019-10-24 DIAGNOSIS — B351 Tinea unguium: Secondary | ICD-10-CM

## 2019-10-24 DIAGNOSIS — M79675 Pain in left toe(s): Secondary | ICD-10-CM | POA: Diagnosis not present

## 2019-10-24 DIAGNOSIS — E1165 Type 2 diabetes mellitus with hyperglycemia: Secondary | ICD-10-CM | POA: Diagnosis not present

## 2019-10-24 DIAGNOSIS — M79674 Pain in right toe(s): Secondary | ICD-10-CM

## 2019-10-24 NOTE — Progress Notes (Signed)
This patient returns to my office for at risk foot care.  This patient requires this care by a professional since this patient will be at risk due to having diabetes.  This patient is unable to cut nails herself since the patient cannot reach her nails.These nails are painful walking and wearing shoes.  This patient presents for at risk foot care today.  General Appearance  Alert, conversant and in no acute stress.  Vascular  Dorsalis pedis  Are weakly  palpable  bilaterally. Posterior tibial pulses are absent  B/L. Capillary return is within normal limits  bilaterally. Temperature is within normal limits  bilaterally.  Neurologic  Senn-Weinstein monofilament wire test within normal limits  bilaterally. Muscle power within normal limits bilaterally.  Nails Thick disfigured discolored nails with subungual debris  hallux nails  B/L. No evidence of bacterial infection or drainage bilaterally.  Orthopedic  No limitations of motion  feet .  No crepitus or effusions noted.  No bony pathology or digital deformities noted.  HAV  B/L.  Skin  normotropic skin with no porokeratosis noted bilaterally.  No signs of infections or ulcers noted.     Onychomycosis  Pain in right toes  Pain in left toes  Consent was obtained for treatment procedures.   Mechanical debridement of nails  hallux   bilaterally performed with a nail nipper.  Filed with dremel without incident.  RTC 3 months    Return office visit   3 months                   Told patient to return for periodic foot care and evaluation due to potential at risk complications.   Gardiner Barefoot DPM

## 2019-10-25 ENCOUNTER — Ambulatory Visit: Payer: Medicare Other

## 2019-10-25 NOTE — Chronic Care Management (AMB) (Signed)
Care Management   Follow Up Note   10/25/2019 Name: Desiree Parker MRN: [id number] DOB: 05-24-1945  Referred by: [name], MD Reason for referral : Chronic Care Management (DM II CHF HTN)   Desiree Parker is a 74 y.o. year old female who is a primary care patient of [name], MD. The care management team was consulted for assistance with care management and care coordination needs.    Review of patient status, including review of consultants reports, relevant laboratory and other test results, and collaboration with appropriate care team members and the patient's provider was performed as part of comprehensive patient evaluation and provision of chronic care management services.    SDOH (Social Determinants of Health) assessments performed: No See Care Plan activities for detailed interventions related to [facility])     Advanced Directives: See Care Plan and Vynca application for related entries.   Goals Addressed              This Visit's Progress   .  I check my blood sugars daily" (pt-stated)        CARE PLAN ENTRY (see longtitudinal plan of care for additional care plan information)  Objective:  Lab Results  Component Value Date   HGBA1C 6.8 06/05/2019 .   Lab Results  Component Value Date   CREATININE 0.88 06/05/2019   CREATININE 0.85 03/07/2019   CREATININE 0.76 05/10/2017 .   [name] No results found for: EGFR  Current Barriers:  [name] Knowledge Deficits related to basic Diabetes pathophysiology and self care/management  Case Manager Clinical Goal(s):  Over the next 90 days, patient will maintain adherence to prescribed treatment plan for diabetes self care/management as evidenced by: keeping a1c below 7.0  Interventions:  . Provided education to patient about basic DM disease process . Reviewed medications with patient and discussed importance of medication adherence . Discussed plans with patient for ongoing care management follow up and provided  patient with direct contact information for care management team . Provided patient with written educational materials related to hypo and and importance of correct treatment- she knows to eat 6 pieces of candy or drink orange juice  or something with sugar to raise her blood sugar and recheck in 15 minutes . Advised patient, providing education and rationale, to check cbg daily and record, calling the office for findings outside established parameters.   . 10/25/19 . The patient states the did not check her blood sugar today . She states that she is eating well and walking.  She still goes to [employer] for the [employer] program and eat lunch   Patient Self Care Activities:  . UNABLE to independently self manage diabaetes . Self administers medications as prescribed . Attends all scheduled provider appointments  Please see past updates related to this goal by clicking on the "Past Updates" button in the selected goal      .  I check my weight daily (pt-stated)        [place] (see longtitudinal plan of care for additional care plan information)   Current Barriers:  [name] Knowledge deficit related to basic heart failure pathophysiology and self care management  Case Manager Clinical Goal(s):  [name] Over the next 90 days, patient will weigh self daily and record  Interventions:  . Provided verbal education on low sodium diet . Reviewed Heart Failure Action Plan in depth  . Advised patient to weigh each morning after emptying bladder . Discussed importance of daily  weight and advised patient to weigh and record daily . Patient stated her weight was 185 lbs . She denied cp, swelling , shob . She states that she does not use salt . She states that she drinks plenty of water . Verbalizes understanding of and follows CHF Action Plan . 10/25/19 . Patient states she did not check her weight today  . She states that she feels good and denies any chest pain shob or  swelling . She states that she is taking a break today and will start back on her routine tomorrow. . She is taking her meds as prescribed   Please see past updates related to this goal by clicking on the "Past Updates" button in the selected goal          The care management team will reach out to the patient again over the next 14 days.   [name] RN, BSN, [facility] Care Management Coordinator [place] Phone: [phone] Fax: [phone]

## 2019-10-25 NOTE — Patient Instructions (Signed)
Visit Information  Goals Addressed              This Visit's Progress   .  I check my blood sugars daily" (pt-stated)        CARE PLAN ENTRY (see longtitudinal plan of care for additional care plan information)  Objective:  Lab Results  Component Value Date   HGBA1C 6.8 06/05/2019 .   Lab Results  Component Value Date   CREATININE 0.88 06/05/2019   CREATININE 0.85 03/07/2019   CREATININE 0.76 05/10/2017 .   [name] No results found for: EGFR  Current Barriers:  [name] Knowledge Deficits related to basic Diabetes pathophysiology and self care/management  Case Manager Clinical Goal(s):  Over the next 90 days, patient will maintain adherence to prescribed treatment plan for diabetes self care/management as evidenced by: keeping a1c below 7.0  Interventions:  . Provided education to patient about basic DM disease process . Reviewed medications with patient and discussed importance of medication adherence . Discussed plans with patient for ongoing care management follow up and provided patient with direct contact information for care management team . Provided patient with written educational materials related to hypo and and importance of correct treatment- she knows to eat 6 pieces of candy or drink orange juice  or something with sugar to raise her blood sugar and recheck in 15 minutes . Advised patient, providing education and rationale, to check cbg daily and record, calling the office for findings outside established parameters.   . 10/25/19 . The patient states the did not check her blood sugar today . She states that she is eating well and walking.  She still goes to [employer] for the [employer] program and eat lunch   Patient Self Care Activities:  . UNABLE to independently self manage diabaetes . Self administers medications as prescribed . Attends all scheduled provider appointments  Please see past updates related to this goal by clicking on the "Past  Updates" button in the selected goal      .  I check my weight daily (pt-stated)        [place] (see longtitudinal plan of care for additional care plan information)   Current Barriers:  [name] Knowledge deficit related to basic heart failure pathophysiology and self care management  Case Manager Clinical Goal(s):  [name] Over the next 90 days, patient will weigh self daily and record  Interventions:  . Provided verbal education on low sodium diet . Reviewed Heart Failure Action Plan in depth  . Advised patient to weigh each morning after emptying bladder . Discussed importance of daily weight and advised patient to weigh and record daily . Patient stated her weight was 185 lbs . She denied cp, swelling , shob . She states that she does not use salt . She states that she drinks plenty of water . Verbalizes understanding of and follows CHF Action Plan . 10/25/19 . Patient states she did not check her weight today  . She states that she feels good and denies any chest pain shob or swelling . She states that she is taking a break today and will start back on her routine tomorrow. . She is taking her meds as prescribed   Please see past updates related to this goal by clicking on the "Past Updates" button in the selected goal         Ms. Saline was given information about Care Management services today including:  1. Care Management services include personalized support  from designated clinical staff supervised by her physician, including individualized plan of care and coordination with other care providers 2. 24/7 contact phone numbers for assistance for urgent and routine care needs. 3. The patient may stop CCM services at any time (effective at the end of the month) by phone call to the office staff.  Patient agreed to services and verbal consent obtained.   The patient verbalized understanding of instructions provided today and declined a print copy of patient instruction  materials.   The care management team will reach out to the patient again over the next 14 days.   [name] RN, BSN, [facility] Care Management Coordinator [place] Phone: [phone] Fax: [phone]

## 2019-11-05 ENCOUNTER — Other Ambulatory Visit: Payer: Self-pay | Admitting: *Deleted

## 2019-11-05 DIAGNOSIS — Z9114 Patient's other noncompliance with medication regimen: Secondary | ICD-10-CM

## 2019-11-05 MED ORDER — ATORVASTATIN CALCIUM 20 MG PO TABS: ORAL_TABLET | ORAL | 1 refills | Status: DC

## 2019-11-05 MED ORDER — LISINOPRIL 10 MG PO TABS: ORAL_TABLET | ORAL | 1 refills | Status: DC

## 2019-11-09 ENCOUNTER — Other Ambulatory Visit: Payer: Self-pay

## 2019-11-09 ENCOUNTER — Ambulatory Visit: Payer: Medicare Other

## 2019-11-09 NOTE — Patient Instructions (Signed)
Visit Information  Goals Addressed              This Visit's Progress     I check my blood sugars daily" (pt-stated)        CARE PLAN ENTRY (see longtitudinal plan of care for additional care plan information)  Objective:  Lab Results  Component Value Date   HGBA1C 6.8 06/05/2019    Lab Results  Component Value Date   CREATININE 0.88 06/05/2019   CREATININE 0.85 03/07/2019   CREATININE 0.76 05/10/2017     No results found for: EGFR  Current Barriers:   Knowledge Deficits related to basic Diabetes pathophysiology and self care/management  Case Manager Clinical Goal(s):  Over the next 90 days, patient will maintain adherence to prescribed treatment plan for diabetes self care/management as evidenced by: keeping a1c below 7.0  Interventions:   Provided education to patient about basic DM disease process  Reviewed medications with patient and discussed importance of medication adherence  Discussed plans with patient for ongoing care management follow up and provided patient with direct contact information for care management team  Provided patient with written educational materials related to hypo and and importance of correct treatment- she knows to eat 6 pieces of candy or drink orange juice  or something with sugar to raise her blood sugar and recheck in 15 minutes  Advised patient, providing education and rationale, to check cbg daily and record, calling the office for findings outside established parameters.    11/09/19  The patient states tThat she was on her way to the senior center.  She was unable to give me any of her values this morning this morning.  She states that she did take them she wrote them down but they were at her home but could not remember them at the time.  She denies having any chest pain shortness of breath or swelling.  She states that she feels good.  She could not talk long due to she was a little behind getting to the center.  Patient  Self Care Activities:   UNABLE to independently self manage diabaetes  Self administers medications as prescribed  Attends all scheduled provider appointments  Please see past updates related to this goal by clicking on the "Past Updates" button in the selected goal         Ms. Gomm was given information about Care Management services today including:  1. Care Management services include personalized support from designated clinical staff supervised by her physician, including individualized plan of care and coordination with other care providers 2. 24/7 contact phone numbers for assistance for urgent and routine care needs. 3. The patient may stop CCM services at any time (effective at the end of the month) by phone call to the office staff.  Patient agreed to services and verbal consent obtained.   The patient verbalized understanding of instructions provided today and declined a print copy of patient instruction materials.   The care management team will reach out to the patient again over the next 14 days.   [name] RN, BSN, [facility] Care Management Coordinator [place] Phone: [phone] Fax: [phone]

## 2019-11-09 NOTE — Chronic Care Management (AMB) (Signed)
  Care Management   Follow Up Note   11/09/2019 Name: Desiree Parker MRN: [id number] DOB: 12-11-1945  Referred by: [name], MD Reason for referral : Appointment (CHF HTN DM II)   Desiree Parker is a 74 y.o. year old female who is a primary care patient of [name], MD. The care management team was consulted for assistance with care management and care coordination needs.    Review of patient status, including review of consultants reports, relevant laboratory and other test results, and collaboration with appropriate care team members and the patient's provider was performed as part of comprehensive patient evaluation and provision of chronic care management services.    SDOH (Social Determinants of Health) assessments performed: No See Care Plan activities for detailed interventions related to [facility])     Advanced Directives: See Care Plan and Vynca application for related entries.   Goals Addressed              This Visit's Progress   .  I check my blood sugars daily" (pt-stated)        CARE PLAN ENTRY (see longtitudinal plan of care for additional care plan information)  Objective:  Lab Results  Component Value Date   HGBA1C 6.8 06/05/2019 .   Lab Results  Component Value Date   CREATININE 0.88 06/05/2019   CREATININE 0.85 03/07/2019   CREATININE 0.76 05/10/2017 .   [name] No results found for: EGFR  Current Barriers:  [name] Knowledge Deficits related to basic Diabetes pathophysiology and self care/management  Case Manager Clinical Goal(s):  Over the next 90 days, patient will maintain adherence to prescribed treatment plan for diabetes self care/management as evidenced by: keeping a1c below 7.0  Interventions:  . Provided education to patient about basic DM disease process . Reviewed medications with patient and discussed importance of medication adherence . Discussed plans with patient for ongoing care management follow up and provided patient with  direct contact information for care management team . Provided patient with written educational materials related to hypo and and importance of correct treatment- she knows to eat 6 pieces of candy or drink orange juice  or something with sugar to raise her blood sugar and recheck in 15 minutes . Advised patient, providing education and rationale, to check cbg daily and record, calling the office for findings outside established parameters.   . 11/09/19 . The patient states the did not check her blood sugar today . She states that she is eating well and walking.  She still goes to [employer] for the [employer] program and eat lunch   Patient Self Care Activities:  . UNABLE to independently self manage diabaetes . Self administers medications as prescribed . Attends all scheduled provider appointments  Please see past updates related to this goal by clicking on the "Past Updates" button in the selected goal          The care management team will reach out to the patient again over the next 14 days.  The patient has been provided with contact information for the care management team and has been advised to call with any health related questions or concerns.   [name] RN, BSN, [facility] Care Management Coordinator [place] Phone: [phone] Fax: [phone]

## 2019-11-14 ENCOUNTER — Telehealth: Payer: Self-pay | Admitting: *Deleted

## 2019-11-14 NOTE — Chronic Care Management (AMB) (Signed)
  Chronic Care Management   Note  11/14/2019 Name: DUNYA MEINERS MRN: [id number] DOB: 01/04/1946  Desiree Parker is a 74 y.o. year old female who is a primary care patient of [name], MD and is actively engaged with the care management team. I reached out to Desiree Parker by phone today to assist with re-scheduling a follow up visit with the RN Case Manager.  Follow up plan: Telephone appointment with care management team member scheduled for: 11/28/2019  [facility] [facility]  [place],  [id number] Direct Dial: [email or web address] Website: [email or web address]

## 2019-11-17 ENCOUNTER — Other Ambulatory Visit: Payer: Self-pay | Admitting: Family Medicine

## 2019-11-17 DIAGNOSIS — M810 Age-related osteoporosis without current pathological fracture: Secondary | ICD-10-CM

## 2019-11-21 ENCOUNTER — Other Ambulatory Visit: Payer: Self-pay | Admitting: Family Medicine

## 2019-11-23 ENCOUNTER — Telehealth: Payer: Medicare Other

## 2019-11-28 ENCOUNTER — Other Ambulatory Visit: Payer: Self-pay

## 2019-11-28 ENCOUNTER — Telehealth: Payer: Medicare Other

## 2019-11-29 ENCOUNTER — Ambulatory Visit: Payer: Medicare Other

## 2019-11-29 NOTE — Patient Instructions (Signed)
Visit Information  Goals Addressed              This Visit's Progress   .  I check my blood sugars daily" (pt-stated)        CARE PLAN ENTRY (see longtitudinal plan of care for additional care plan information)  Objective:  Lab Results  Component Value Date   HGBA1C 6.8 06/05/2019 .   Lab Results  Component Value Date   CREATININE 0.88 06/05/2019   CREATININE 0.85 03/07/2019   CREATININE 0.76 05/10/2017 .   [name] No results found for: EGFR  Current Barriers:  [name] Knowledge Deficits related to basic Diabetes pathophysiology and self care/management  Case Manager Clinical Goal(s):  Over the next 90 days, patient will maintain adherence to prescribed treatment plan for diabetes self care/management as evidenced by: keeping a1c below 7.0  Interventions:  . Provided education to patient about basic DM disease process . Reviewed medications with patient and discussed importance of medication adherence . Discussed plans with patient for ongoing care management follow up and provided patient with direct contact information for care management team . Provided patient with written educational materials related to hypo and and importance of correct treatment- she knows to eat 6 pieces of candy or drink orange juice  or something with sugar to raise her blood sugar and recheck in 15 minutes . Advised patient, providing education and rationale, to check cbg daily and record, calling the office for findings outside established parameters.   . 11/29/19 . The patient continues to go to the senior center for lunch and she is getting her exercise.  Her FBS was 113.  She states that she is taking her meds and her son still purchases her groceries for her. . Encouraged her to continue to do to great job she is doing with her health .  Patient Self Care Activities:  . UNABLE to independently self manage diabaetes . Self administers medications as prescribed . Attends all scheduled provider  appointments  Please see past updates related to this goal by clicking on the "Past Updates" button in the selected goal      .  I check my weight daily (pt-stated)        [place] (see longtitudinal plan of care for additional care plan information)   Current Barriers:  [name] Knowledge deficit related to basic heart failure pathophysiology and self care management  Case Manager Clinical Goal(s):  [name] Over the next 90 days, patient will weigh self daily and record  Interventions:  . Provided verbal education on low sodium diet . Reviewed Heart Failure Action Plan in depth  . Advised patient to weigh each morning after emptying bladder . Discussed importance of daily weight and advised patient to weigh and record daily . Patient stated her weight was 185 lbs . She denied cp, swelling , shob . She states that she does not use salt . She states that she drinks plenty of water . Verbalizes understanding of and follows CHF Action Plan . 11/29/19 . Spoke with the patient and she was sitting out side.  She states that she checked her weight this morning and it was 185 lbs. . No chest pain shortness of breath or swelling. . Taking meds daily  Please see past updates related to this goal by clicking on the "Past Updates" button in the selected goal          The patient verbalized understanding of instructions provided today and declined a print  copy of patient instruction materials.   The care management team will reach out to the patient again over the next 21 days.   [name] RN, BSN, [facility] Care Management Coordinator [place] Phone: [phone] Fax: [phone]

## 2019-11-29 NOTE — Chronic Care Management (AMB) (Signed)
Care Management   Follow Up Note   11/29/2019 Name: Desiree Parker MRN: [id number] DOB: 01/05/1946  Referred by: [name], MD Reason for referral : Appointment (CHF/DM II/HTN)   JAIMY KLIETHERMES is a 74 y.o. year old female who is a primary care patient of [name], MD. The care management team was consulted for assistance with care management and care coordination needs.    Review of patient status, including review of consultants reports, relevant laboratory and other test results, and collaboration with appropriate care team members and the patient's provider was performed as part of comprehensive patient evaluation and provision of chronic care management services.    SDOH (Social Determinants of Health) assessments performed: No See Care Plan activities for detailed interventions related to [facility])     Advanced Directives: See Care Plan and Vynca application for related entries.   Goals Addressed              This Visit's Progress   .  I check my blood sugars daily" (pt-stated)        CARE PLAN ENTRY (see longtitudinal plan of care for additional care plan information)  Objective:  Lab Results  Component Value Date   HGBA1C 6.8 06/05/2019 .   Lab Results  Component Value Date   CREATININE 0.88 06/05/2019   CREATININE 0.85 03/07/2019   CREATININE 0.76 05/10/2017 .   [name] No results found for: EGFR  Current Barriers:  [name] Knowledge Deficits related to basic Diabetes pathophysiology and self care/management  Case Manager Clinical Goal(s):  Over the next 90 days, patient will maintain adherence to prescribed treatment plan for diabetes self care/management as evidenced by: keeping a1c below 7.0  Interventions:  . Provided education to patient about basic DM disease process . Reviewed medications with patient and discussed importance of medication adherence . Discussed plans with patient for ongoing care management follow up and provided patient with  direct contact information for care management team . Provided patient with written educational materials related to hypo and and importance of correct treatment- she knows to eat 6 pieces of candy or drink orange juice  or something with sugar to raise her blood sugar and recheck in 15 minutes . Advised patient, providing education and rationale, to check cbg daily and record, calling the office for findings outside established parameters.   . 11/29/19 . The patient continues to go to the senior center for lunch and she is getting her exercise.  Her FBS was 113.  She states that she is taking her meds and her son still purchases her groceries for her. . Encouraged her to continue to do to great job she is doing with her health .  Patient Self Care Activities:  . UNABLE to independently self manage diabaetes . Self administers medications as prescribed . Attends all scheduled provider appointments  Please see past updates related to this goal by clicking on the "Past Updates" button in the selected goal      .  I check my weight daily (pt-stated)        [place] (see longtitudinal plan of care for additional care plan information)   Current Barriers:  [name] Knowledge deficit related to basic heart failure pathophysiology and self care management  Case Manager Clinical Goal(s):  [name] Over the next 90 days, patient will weigh self daily and record  Interventions:  . Provided verbal education on low sodium diet . Reviewed Heart Failure Action Plan in depth  .  Advised patient to weigh each morning after emptying bladder . Discussed importance of daily weight and advised patient to weigh and record daily . Patient stated her weight was 185 lbs . She denied cp, swelling , shob . She states that she does not use salt . She states that she drinks plenty of water . Verbalizes understanding of and follows CHF Action Plan . 11/29/19 . Spoke with the patient and she was sitting out side.  She  states that she checked her weight this morning and it was 185 lbs. . No chest pain shortness of breath or swelling. . Taking meds daily  Please see past updates related to this goal by clicking on the "Past Updates" button in the selected goal          The care management team will reach out to the patient again over the next 21 days.   [name] RN, BSN, [facility] Care Management Coordinator [place] Phone: [phone] Fax: [phone]

## 2019-12-21 ENCOUNTER — Ambulatory Visit: Payer: Medicare Other

## 2019-12-21 NOTE — Patient Instructions (Signed)
Visit Information  Goals Addressed              This Visit's Progress   .  I check my blood sugars daily" (pt-stated)        CARE PLAN ENTRY (see longtitudinal plan of care for additional care plan information)  Objective:  Lab Results  Component Value Date   HGBA1C 6.8 06/05/2019 .   Lab Results  Component Value Date   CREATININE 0.88 06/05/2019   CREATININE 0.85 03/07/2019   CREATININE 0.76 05/10/2017 .   [name] No results found for: EGFR  Current Barriers:  [name] Knowledge Deficits related to basic Diabetes pathophysiology and self care/management  Case Manager Clinical Goal(s):  Over the next 90 days, patient will maintain adherence to prescribed treatment plan for diabetes self care/management as evidenced by: keeping a1c below 7.0  Interventions:  . Provided education to patient about basic DM disease process . Reviewed medications with patient and discussed importance of medication adherence . Discussed plans with patient for ongoing care management follow up and provided patient with direct contact information for care management team . Provided patient with written educational materials related to hypo and and importance of correct treatment- she knows to eat 6 pieces of candy or drink orange juice  or something with sugar to raise her blood sugar and recheck in 15 minutes . Advised patient, providing education and rationale, to check cbg daily and record, calling the office for findings outside established parameters.   . The patient checked her blood sugar today and it is 94.  She did not check her Blood pressure today.  She said that she forgot how to check it.  I encouraged her to talk with Ray her sone to show her how to do it. . She states that she continues to eat well.  Ray continues to by the groceries for her. . Encouraged her to continue to do to great job she is doing with her health . She states she is going to call the office and talk with Dr Gwendlyn Deutscher concerning  the booster covid shot and does she need to get one. .  Patient Self Care Activities:  . UNABLE to independently self manage diabaetes . Self administers medications as prescribed . Attends all scheduled provider appointments  Please see past updates related to this goal by clicking on the "Past Updates" button in the selected goal      .  I check my weight daily (pt-stated)        Desiree Parker (see longtitudinal plan of care for additional care plan information)   Current Barriers:  [name] Knowledge deficit related to basic heart failure pathophysiology and self care management  Case Manager Clinical Goal(s):  [name] Over the next 90 days, patient will weigh self daily and record  Interventions:  . Provided verbal education on low sodium diet . Reviewed Heart Failure Action Plan in depth  . Advised patient to weigh each morning after emptying bladder . Discussed importance of daily weight and advised patient to weigh and record daily . Patient stated her weight was 185 lbs . She denied cp, swelling , shob . She states that she does not use salt . She states that she drinks plenty of water . Verbalizes understanding of and follows CHF Action Plan . Patient states that she checked her weight today and it was 189.2 lbs.  She denies any shortness of breath chest pain or swelling. . She still walks to the senior  center for lunch and to see others.  She follows precautions  Please see past updates related to this goal by clicking on the "Past Updates" button in the selected goal         [name] was given information about Care Management services today including:  1. Care Management services include personalized support from designated clinical staff supervised by her physician, including individualized plan of care and coordination with other care providers 2. 24/7 contact phone numbers for assistance for urgent and routine care needs. 3. The patient may stop CCM services at any time  (effective at the end of the month) by phone call to the office staff.  Patient agreed to services and verbal consent obtained.   The patient verbalized understanding of instructions provided today and declined a print copy of patient instruction materials.   The care management team will reach out to the patient again over the next 14 days.   [name] RN, BSN, [facility] Care Management Coordinator [place] Phone: [phone] Fax: [phone]

## 2019-12-21 NOTE — Chronic Care Management (AMB) (Signed)
Care Management   Follow Up Note   12/21/2019 Name: Desiree Parker MRN: [id number] DOB: 08/28/1945  Referred by: Eniola, Kehinde T, MD Reason for referral : Chronic Care Management (DM II CHF HTN)   Desiree Parker is a 74 y.o. year old female who is a primary care patient of Eniola, Kehinde T, MD. The care management team was consulted for assistance with care management and care coordination needs.    Review of patient status, including review of consultants reports, relevant laboratory and other test results, and collaboration with appropriate care team members and the patient's provider was performed as part of comprehensive patient evaluation and provision of chronic care management services.    SDOH (Social Determinants of Health) assessments performed: No See Care Plan activities for detailed interventions related to SDOH)     Advanced Directives: See Care Plan and Vynca application for related entries.   Goals Addressed              This Visit's Progress   .  I check my blood sugars daily" (pt-stated)        CARE PLAN ENTRY (see longtitudinal plan of care for additional care plan information)  Objective:  Lab Results  Component Value Date   HGBA1C 6.8 06/05/2019 .   Lab Results  Component Value Date   CREATININE 0.88 06/05/2019   CREATININE 0.85 03/07/2019   CREATININE 0.76 05/10/2017 .   . No results found for: EGFR  Current Barriers:  . Knowledge Deficits related to basic Diabetes pathophysiology and self care/management  Case Manager Clinical Goal(s):  Over the next 90 days, patient will maintain adherence to prescribed treatment plan for diabetes self care/management as evidenced by: keeping a1c below 7.0  Interventions:  . Provided education to patient about basic DM disease process . Reviewed medications with patient and discussed importance of medication adherence . Discussed plans with patient for ongoing care management follow up and provided  patient with direct contact information for care management team . Provided patient with written educational materials related to hypo and and importance of correct treatment- she knows to eat 6 pieces of candy or drink orange juice  or something with sugar to raise her blood sugar and recheck in 15 minutes . Advised patient, providing education and rationale, to check cbg daily and record, calling the office for findings outside established parameters.   . The patient checked her blood sugar today and it is 94.  She did not check her Blood pressure today.  She said that she forgot how to check it.  I encouraged her to talk with Ray her sone to show her how to do it. . She states that she continues to eat well.  Ray continues to by the groceries for her. . Encouraged her to continue to do to great job she is doing with her health . She states she is going to call the office and talk with Dr Eniola concerning the booster covid shot and does she need to get one. .  Patient Self Care Activities:  . UNABLE to independently self manage diabaetes . Self administers medications as prescribed . Attends all scheduled provider appointments  Please see past updates related to this goal by clicking on the "Past Updates" button in the selected goal      .  I check my weight daily (pt-stated)        CARE PLAN ENTRY (see longtitudinal plan of care for additional care plan information)     Current Barriers:  [name] Knowledge deficit related to basic heart failure pathophysiology and self care management  Case Manager Clinical Goal(s):  [name] Over the next 90 days, patient will weigh self daily and record  Interventions:  . Provided verbal education on low sodium diet . Reviewed Heart Failure Action Plan in depth  . Advised patient to weigh each morning after emptying bladder . Discussed importance of daily weight and advised patient to weigh and record daily . Patient stated her weight was 185 lbs . She denied  cp, swelling , shob . She states that she does not use salt . She states that she drinks plenty of water . Verbalizes understanding of and follows CHF Action Plan . Patient states that she checked her weight today and it was 189.2 lbs.  She denies any shortness of breath chest pain or swelling. . She still walks to the senior center for lunch and to see others.  She follows precautions  Please see past updates related to this goal by clicking on the "Past Updates" button in the selected goal          The care management team will reach out to the patient again over the next 14 days.   [name] RN, BSN, [facility] Care Management Coordinator [place] Phone: [phone] Fax: [phone]

## 2019-12-24 DIAGNOSIS — E119 Type 2 diabetes mellitus without complications: Secondary | ICD-10-CM | POA: Diagnosis not present

## 2019-12-24 DIAGNOSIS — Z961 Presence of intraocular lens: Secondary | ICD-10-CM | POA: Diagnosis not present

## 2019-12-24 DIAGNOSIS — Z7984 Long term (current) use of oral hypoglycemic drugs: Secondary | ICD-10-CM | POA: Diagnosis not present

## 2019-12-24 LAB — HM DIABETES EYE EXAM

## 2020-01-01 ENCOUNTER — Telehealth: Payer: Self-pay | Admitting: *Deleted

## 2020-01-01 NOTE — Chronic Care Management (AMB) (Signed)
  Care Management   Note  01/01/2020 Name: TIYONNA SARDINHA MRN: [id number] DOB: 02-03-1946  MARKITA STCHARLES is a 74 y.o. year old female who is a primary care patient of [name], MD and is actively engaged with the care management team. I reached out to [name] by phone today to assist with re-scheduling a follow up visit with the RN Case Manager.  Follow up plan: Unsuccessful telephone outreach attempt made. A HIPAA compliant phone message was left for the patient providing contact information and requesting a return call. The care management team will reach out to the patient again over the next 7 days. If patient returns call to provider office, please advise to call [place] at [phone].  [place] [facility]

## 2020-01-07 ENCOUNTER — Telehealth: Payer: Medicare Other

## 2020-01-08 NOTE — Chronic Care Management (AMB) (Signed)
°  Care Management   Note  01/08/2020 Name: Desiree Parker MRN: [id number] DOB: 03-May-1945  Desiree Parker is a 74 y.o. year old female who is a primary care patient of [name], MD and is actively engaged with the care management team. I reached out to [name] by phone today to assist with re-scheduling a follow up visit with the RN Case Manager.  Follow up plan: Telephone appointment with care management team member scheduled for: 01/16/2020  [place] [facility]

## 2020-01-16 ENCOUNTER — Ambulatory Visit: Payer: Medicare Other

## 2020-01-16 NOTE — Patient Instructions (Signed)
Visit Information  Goals Addressed              This Visit's Progress   .  I check my blood sugars daily" (pt-stated)        CARE PLAN ENTRY (see longtitudinal plan of care for additional care plan information)  Objective:  Lab Results  Component Value Date   HGBA1C 5.7 09/14/2019 .   Lab Results  Component Value Date   CREATININE 0.88 06/05/2019   CREATININE 0.85 03/07/2019   CREATININE 0.76 05/10/2017 .   [name] No results found for: EGFR  Current Barriers:  [name] Knowledge Deficits related to basic Diabetes pathophysiology and self care/management  Case Manager Clinical Goal(s):  Over the next 90 days, patient will maintain adherence to prescribed treatment plan for diabetes self care/management as evidenced by: keeping a1c below 7.0  Interventions:  . Provided education to patient about basic DM disease process . Reviewed medications with patient and discussed importance of medication adherence . Discussed plans with patient for ongoing care management follow up and provided patient with direct contact information for care management team . Provided patient with written educational materials related to hypo and and importance of correct treatment- she knows to eat 6 pieces of candy or drink orange juice  or something with sugar to raise her blood sugar and recheck in 15 minutes . Advised patient, providing education and rationale, to check cbg daily and record, calling the office for findings outside established parameters.   . The patient checked her blood sugar today and it was 103.  S She is taking her medications as prescribed.  Her son [name] is still purchasing her groceries for her. . Reviewed Why it is important to check heer blood sugars.  The patient went and had her eyes checked in September and the note is in the chart.  We discussed foot care and she has a podiatrist who trims her nails. .  Patient Self Care Activities:  . UNABLE to independently self manage  diabaetes . Self administers medications as prescribed . Attends all scheduled provider appointments  Please see past updates related to this goal by clicking on the "Past Updates" button in the selected goal      .  I check my weight daily (pt-stated)        [place] (see longtitudinal plan of care for additional care plan information)   Current Barriers:  [name] Knowledge deficit related to basic heart failure pathophysiology and self care management  Case Manager Clinical Goal(s):  [name] Over the next 90 days, patient will weigh self daily and record  Interventions:  . Provided verbal education on low sodium diet . Reviewed Heart Failure Action Plan in depth  . Advised patient to weigh each morning after emptying bladder . Discussed importance of daily weight and advised patient to weigh and record daily . Patient stated her weight was 185 lbs . She denied cp, swelling , shob . She states that she does not use salt . She states that she drinks plenty of water . Verbalizes understanding of and follows CHF Action Plan . Patient states that she checked her weight today it is 189 lbs.  She denies any shortness of breath chest pain or swelling. . She states that she is exercising by [facility]. . Patient states that she does not check her BP.  Discussed why it is important to check her blood pressure. Went over signs and symptoms  of HTN. She verbalized understanding.  Please see past updates related to this goal by clicking on the "Past Updates" button in the selected goal         Ms. Goding was given information about Care Management services today including:  1. Care Management services include personalized support from designated clinical staff supervised by her physician, including individualized plan of care and coordination with other care providers 2. 24/7 contact phone numbers for assistance for urgent and routine care needs. 3. The patient may  stop CCM services at any time (effective at the end of the month) by phone call to the office staff.  Patient agreed to services and verbal consent obtained.   The patient verbalized understanding of instructions provided today and declined a print copy of patient instruction materials.   The care management team will reach out to the patient again over the next 14 days.   [name] RN, BSN, [facility] Care Management Coordinator [place] Phone: [phone] Fax: [phone]

## 2020-01-16 NOTE — Chronic Care Management (AMB) (Signed)
Care Management   Follow Up Note   01/16/2020 Name: Desiree Parker MRN: [id number] DOB: 1945/07/02  Referred by: [name], MD Reason for referral : Appointment (DM II CHF HTN)   Desiree Parker is a 74 y.o. year old female who is a primary care patient of [name], MD. The care management team was consulted for assistance with care management and care coordination needs.    Review of patient status, including review of consultants reports, relevant laboratory and other test results, and collaboration with appropriate care team members and the patient's provider was performed as part of comprehensive patient evaluation and provision of chronic care management services.    SDOH (Social Determinants of Health) assessments performed: No See Care Plan activities for detailed interventions related to [facility])     Advanced Directives: See Care Plan and Vynca application for related entries.   Goals Addressed              This Visit's Progress   .  I check my blood sugars daily" (pt-stated)        CARE PLAN ENTRY (see longtitudinal plan of care for additional care plan information)  Objective:  Lab Results  Component Value Date   HGBA1C 5.7 09/14/2019 .   Lab Results  Component Value Date   CREATININE 0.88 06/05/2019   CREATININE 0.85 03/07/2019   CREATININE 0.76 05/10/2017 .   [name] No results found for: EGFR  Current Barriers:  [name] Knowledge Deficits related to basic Diabetes pathophysiology and self care/management  Case Manager Clinical Goal(s):  Over the next 90 days, patient will maintain adherence to prescribed treatment plan for diabetes self care/management as evidenced by: keeping a1c below 7.0  Interventions:  . Provided education to patient about basic DM disease process . Reviewed medications with patient and discussed importance of medication adherence . Discussed plans with patient for ongoing care management follow up and provided patient with  direct contact information for care management team . Provided patient with written educational materials related to hypo and and importance of correct treatment- she knows to eat 6 pieces of candy or drink orange juice  or something with sugar to raise her blood sugar and recheck in 15 minutes . Advised patient, providing education and rationale, to check cbg daily and record, calling the office for findings outside established parameters.   . The patient checked her blood sugar today and it was 103.  S She is taking her medications as prescribed.  [name] is still purchasing her groceries for her. . Reviewed Why it is important to check heer blood sugars.  The patient went and had her eyes checked in September and the note is in the chart.  We discussed foot care and she has a podiatrist who trims her nails. .  Patient Self Care Activities:  . UNABLE to independently self manage diabaetes . Self administers medications as prescribed . Attends all scheduled provider appointments  Please see past updates related to this goal by clicking on the "Past Updates" button in the selected goal      .  I check my weight daily (pt-stated)        [place] (see longtitudinal plan of care for additional care plan information)   Current Barriers:  [name] Knowledge deficit related to basic heart failure pathophysiology and self care management  Case Manager Clinical Goal(s):  [name] Over the next 90 days, patient will weigh self daily and record  Interventions:  . Provided verbal education on low sodium diet . Reviewed Heart Failure Action Plan in depth  . Advised patient to weigh each morning after emptying bladder . Discussed importance of daily weight and advised patient to weigh and record daily . Patient stated her weight was 185 lbs . She denied cp, swelling , shob . She states that she does not use salt . She states that she drinks plenty of water . Verbalizes understanding of and follows  CHF Action Plan . Patient states that she checked her weight today it is 189 lbs.  She denies any shortness of breath chest pain or swelling. . She states that she is exercising [facility]. . Patient states that she does not check her BP.  Discussed why it is important to check her blood pressure. Went over signs and symptoms of HTN. She verbalized understanding.  Please see past updates related to this goal by clicking on the "Past Updates" button in the selected goal          The care management team will reach out to the patient again over the next 14 days.   [name] RN, BSN, [facility] Care Management Coordinator [place] Phone: [phone] Fax: [phone]

## 2020-01-24 ENCOUNTER — Other Ambulatory Visit: Payer: Self-pay

## 2020-01-24 ENCOUNTER — Ambulatory Visit
Admission: RE | Admit: 2020-01-24 | Discharge: 2020-01-24 | Disposition: A | Discharge status: Home / Self Care | Payer: Medicare Other | Source: Ambulatory Visit | Attending: Family Medicine | Admitting: Family Medicine

## 2020-01-24 DIAGNOSIS — E2839 Other primary ovarian failure: Secondary | ICD-10-CM

## 2020-01-24 DIAGNOSIS — M8588 Other specified disorders of bone density and structure, other site: Secondary | ICD-10-CM | POA: Diagnosis not present

## 2020-01-24 DIAGNOSIS — Z780 Asymptomatic menopausal state: Secondary | ICD-10-CM | POA: Diagnosis not present

## 2020-01-25 ENCOUNTER — Telehealth: Payer: Self-pay | Admitting: Family Medicine

## 2020-01-25 DIAGNOSIS — M810 Age-related osteoporosis without current pathological fracture: Secondary | ICD-10-CM

## 2020-01-25 MED ORDER — ALENDRONATE SODIUM 70 MG PO TABS: ORAL_TABLET | ORAL | 0 refills | Status: DC

## 2020-01-25 MED ORDER — CALCIUM CARBONATE-VITAMIN D 500-200 MG-UNIT PO TABS: 1.0000 | ORAL_TABLET | Freq: Two times a day (BID) | ORAL | 4 refills | Status: DC

## 2020-01-25 NOTE — Telephone Encounter (Signed)
Bone density result discussed with [name]  Osteopenia with a T-score of -2.4 10 year fracture probability of major hip fracture: 3.7% And major osteoporosis : 14%  She is not compliant with her Vit D and Cal. Her son requested combination of both and escription to her pharmacy. This will help her remember to take her meds.  She has the option to continue Fosamax according to recommendation c. Low bone mass (T-score between -1.0 and -2.5 at the femoral neck or spine) and a 10 year probability of a hip fracture > or = 3% or a 10 year probability of a major osteoporosis-related fracture > or = 20% based on the [place]-adapted WHO algorithm.  Her son prefers continuation of Fosamax.  Meds refilled.  Her son also mentioned that she got her flu shot yesterday at the pharmacy. I will update her record.

## 2020-01-30 ENCOUNTER — Ambulatory Visit: Payer: Medicare Other

## 2020-01-30 NOTE — Chronic Care Management (AMB) (Signed)
  Care Management     Care Management Outreach Note  01/30/2020 Name: Desiree Parker MRN: [id number] DOB: 1946-01-16  Desiree Parker is a 74 y.o. year old female who is a primary care patient of [name], MD . The Care Management team was consulted for assistance with . Disease Management Educational Needs for CHF/ HTN DM II.   RN Care Manager reached out to Desiree Parker today by phone to follow up with the patient about her chronic conditions.  The patient stated that she was not at home that she went to [place] with [name].  She forgot and left her equipment at home to check her vitals signs and was unable to remember any from previous days. We dicussed signs and symptoms of HF and DM.  We talked about diet.   Advised her that if she plans goes on trips to remember to take her meter with her.  She said this trip was short  notice.    Follow Up Plan: The care management team will reach out to the patient again over the next 7 days.   [name] RN, BSN, [facility] Care Management Coordinator [place] Phone: [phone] Fax: [phone]

## 2020-02-02 ENCOUNTER — Other Ambulatory Visit: Payer: Self-pay | Admitting: Family Medicine

## 2020-02-02 DIAGNOSIS — K219 Gastro-esophageal reflux disease without esophagitis: Secondary | ICD-10-CM

## 2020-02-07 ENCOUNTER — Ambulatory Visit: Payer: Medicare Other

## 2020-02-07 NOTE — Chronic Care Management (AMB) (Signed)
RN  Care Management   Follow Up Note   02/07/2020 Name: Desiree Parker MRN: [id number] DOB: 1945-07-06  Reason for referral : Chronic Care Management (CHF HTN DM II)   Desiree Parker is a 74 y.o. year old female who is a primary care patient of Desiree Feil, MD. The care management team was consulted for assistance with care management and care coordination needs.    Subjective: " I am doing well"   Objective:  BP Readings from Last 3 Encounters:  09/14/19 112/72  07/17/19 128/68  06/20/19 132/74    Wt Readings from Last 3 Encounters:  09/14/19 190 lb 9.6 oz (86.5 kg)  09/03/19 192 lb (87.1 kg)  08/01/19 201 lb (91.2 kg)   Assessment: Called to follow up with the patient for her DM II CHF HTN.   Goals Addressed              This Visit's Progress   .  I check my blood sugars daily" (pt-stated)        CARE PLAN ENTRY (see longtitudinal plan of care for additional care plan information)  Objective:  Lab Results  Component Value Date   HGBA1C 5.7 09/14/2019 .   Lab Results  Component Value Date   CREATININE 0.88 06/05/2019   CREATININE 0.85 03/07/2019   CREATININE 0.76 05/10/2017 .   [name] No results found for: EGFR  Current Barriers:  [name] Knowledge Deficits related to basic Diabetes pathophysiology and self care/management  Case Manager Clinical Goal(s):  Over the next 90 days, patient will maintain adherence to prescribed treatment plan for diabetes self care/management as evidenced by: keeping a1c below 7.0  Interventions:  . Provided education to patient about basic DM disease process . Reviewed medications with patient and discussed importance of medication adherence . Discussed plans with patient for ongoing care management follow up and provided patient with direct contact information for care management team . Provided patient with written educational materials related to hypo and and importance of correct treatment- she knows to eat 6 pieces of  candy or drink orange juice  or something with sugar to raise her blood sugar and recheck in 15 minutes . Advised patient, providing education and rationale, to check cbg daily and record, calling the office for findings outside established parameters.   . The patient checked her blood sugar today and it was 113   . She is taking her medications as prescribed.    Patient Self Care Activities:  . UNABLE to independently self manage diabaetes . Self administers medications as prescribed . Attends all scheduled provider appointments  Please see past updates related to this goal by clicking on the "Past Updates" button in the selected goal      .  I check my weight daily (pt-stated)        Desiree Parker (see longtitudinal plan of care for additional care plan information)   Current Barriers:  [name] Knowledge deficit related to basic heart failure pathophysiology and self care management  Case Manager Clinical Goal(s):  [name] Over the next 90 days, patient will weigh self daily and record  Interventions:  . Provided verbal education on low sodium diet . Reviewed Heart Failure Action Plan in depth  . Advised patient to weigh each morning after emptying bladder . Discussed importance of daily weight and advised patient to weigh and record daily . Patient stated her weight was 185 lbs . She denied cp, swelling , shob . She  states that she does not use salt . She states that she drinks plenty of water . Verbalizes understanding of and follows CHF Action Plan . Patient states that she checked her weight today it is 189.2 lbs.  She denies any shortness of breath chest pain or swelling. . [facility] they had someone come down with Covid.  She said they Ray is going to sign her up to get her Booster shot . Patient states that she does not check her BP.  Discussed why it is important to check her blood pressure. Went over signs and symptoms of HTN. She  verbalized understanding.  Please see past updates related to this goal by clicking on the "Past Updates" button in the selected goal         Review of patient status, including review of consultants reports, relevant laboratory and other test results, and collaboration with appropriate care team members and the patient's provider was performed as part of comprehensive patient evaluation and provision of chronic care management services.    SDOH (Social Determinants of Health) assessments performed: No See Care Plan activities for detailed interventions related to SDOH)         Plan: Telephone follow up with Desiree Parker over the next 21 days.   Desiree Arms RN, BSN, [facility] Care Management Coordinator [place] Phone: [phone] Fax: [phone]

## 2020-02-07 NOTE — Patient Instructions (Signed)
Visit Information  Goals Addressed              This Visit's Progress   .  I check my blood sugars daily" (pt-stated)        CARE PLAN ENTRY (see longtitudinal plan of care for additional care plan information)  Objective:  Lab Results  Component Value Date   HGBA1C 5.7 09/14/2019 .   Lab Results  Component Value Date   CREATININE 0.88 06/05/2019   CREATININE 0.85 03/07/2019   CREATININE 0.76 05/10/2017 .   [name] No results found for: EGFR  Current Barriers:  [name] Knowledge Deficits related to basic Diabetes pathophysiology and self care/management  Case Manager Clinical Goal(s):  Over the next 90 days, patient will maintain adherence to prescribed treatment plan for diabetes self care/management as evidenced by: keeping a1c below 7.0  Interventions:  . Provided education to patient about basic DM disease process . Reviewed medications with patient and discussed importance of medication adherence . Discussed plans with patient for ongoing care management follow up and provided patient with direct contact information for care management team . Provided patient with written educational materials related to hypo and and importance of correct treatment- she knows to eat 6 pieces of candy or drink orange juice  or something with sugar to raise her blood sugar and recheck in 15 minutes . Advised patient, providing education and rationale, to check cbg daily and record, calling the office for findings outside established parameters.   . The patient checked her blood sugar today and it was 113   . She is taking her medications as prescribed.    Patient Self Care Activities:  . UNABLE to independently self manage diabaetes . Self administers medications as prescribed . Attends all scheduled provider appointments  Please see past updates related to this goal by clicking on the "Past Updates" button in the selected goal      .  I check my weight daily (pt-stated)        [place] (see longtitudinal plan of care for additional care plan information)   Current Barriers:  [name] Knowledge deficit related to basic heart failure pathophysiology and self care management  Case Manager Clinical Goal(s):  [name] Over the next 90 days, patient will weigh self daily and record  Interventions:  . Provided verbal education on low sodium diet . Reviewed Heart Failure Action Plan in depth  . Advised patient to weigh each morning after emptying bladder . Discussed importance of daily weight and advised patient to weigh and record daily . Patient stated her weight was 185 lbs . She denied cp, swelling , shob . She states that she does not use salt . She states that she drinks plenty of water . Verbalizes understanding of and follows CHF Action Plan . Patient states that she checked her weight today it is 189.2 lbs.  She denies any shortness of breath chest pain or swelling. . [facility] at [facility] they had someone come down with Covid.  She said they Ray is going to sign her up to get her Booster shot . Patient states that she does not check her BP.  Discussed why it is important to check her blood pressure. Went over signs and symptoms of HTN. She verbalized understanding.  Please see past updates related to this goal by clicking on the "Past Updates" button in the selected goal  Ms. Vavrek was given information about Care Management services today including:  1. Care Management services include personalized support from designated clinical staff supervised by her physician, including individualized plan of care and coordination with other care providers 2. 24/7 contact phone numbers for assistance for urgent and routine care needs. 3. The patient may stop CCM services at any time (effective at the end of the month) by phone call to the office staff.  Patient agreed to services and verbal consent obtained.   The patient verbalized  understanding of instructions provided today and declined a print copy of patient instruction materials.   Plan: Telephone follow up with [name] over the next 21 days.   [name] RN, BSN, [facility] Care Management Coordinator [place] Phone: [phone] Fax: [phone]

## 2020-02-20 ENCOUNTER — Encounter: Payer: Self-pay | Admitting: Podiatry

## 2020-02-20 ENCOUNTER — Ambulatory Visit (INDEPENDENT_AMBULATORY_CARE_PROVIDER_SITE_OTHER): Payer: Medicare Other | Admitting: Podiatry

## 2020-02-20 ENCOUNTER — Other Ambulatory Visit: Payer: Self-pay

## 2020-02-20 DIAGNOSIS — M79674 Pain in right toe(s): Secondary | ICD-10-CM | POA: Diagnosis not present

## 2020-02-20 DIAGNOSIS — E1165 Type 2 diabetes mellitus with hyperglycemia: Secondary | ICD-10-CM | POA: Diagnosis not present

## 2020-02-20 DIAGNOSIS — M79675 Pain in left toe(s): Secondary | ICD-10-CM

## 2020-02-20 DIAGNOSIS — B351 Tinea unguium: Secondary | ICD-10-CM

## 2020-02-20 NOTE — Progress Notes (Addendum)
This patient returns to my office for at risk foot care.  This patient requires this care by a professional since this patient will be at risk due to having diabetes.  This patient is unable to cut nails herself since the patient cannot reach her nails.These nails are painful walking and wearing shoes.  This patient presents for at risk foot care today.  General Appearance  Alert, conversant and in no acute stress.  Vascular  Dorsalis pedis  Are weakly  palpable  bilaterally. Posterior tibial pulses are absent  B/L. Capillary return is within normal limits  bilaterally. Temperature is within normal limits  bilaterally.  Neurologic  Senn-Weinstein monofilament wire test within normal limits  bilaterally. Muscle power within normal limits bilaterally.  Nails Thick disfigured discolored nails with subungual debris  hallux nails  B/L. No evidence of bacterial infection or drainage bilaterally.  Orthopedic  No limitations of motion  feet .  No crepitus or effusions noted.  No bony pathology or digital deformities noted.  HAV  B/L.  Skin  normotropic skin with no porokeratosis noted bilaterally.  No signs of infections or ulcers noted.     Onychomycosis  Pain in right toes  Pain in left toes  Consent was obtained for treatment procedures.   Mechanical debridement of nails  hallux   bilaterally performed with a nail nipper.  Filed with dremel without incident.  RTC 3 months  Patient had problem with dremel usage.   Return office visit   3 months                   Told patient to return for periodic foot care and evaluation due to potential at risk complications.   Gardiner Barefoot DPM

## 2020-02-22 ENCOUNTER — Ambulatory Visit: Payer: Medicare Other

## 2020-02-22 NOTE — Chronic Care Management (AMB) (Signed)
RN  Care Management   Follow Up Note   02/22/2020 Name: Desiree Parker MRN: [id number] DOB: 16-Mar-1946  Reason for referral : Chronic Care Management (CHF DM II HTN)   Desiree Parker is a 74 y.o. year old female who is a primary care patient of [name], MD. The care management team was consulted for assistance with care management and care coordination needs.      Assessment: Called to follow up on CHF DM II HTN.  Patient states that she is doing fine but she has not checked her weight or BP.  Patient Care Plan: RN Care Manager  Problem Identified: Glycemic Management (Diabetes, Type 2)     Long-Range Goal: Management of CHF, DM II and HTN   Note:   . Chronic Disease Management support, education, and care coordination needs related to CHF, HTN, and DMII  Clinical Goal(s) related to CHF, HTN, and DMII:  Over the next 90 days, patient will:  . Work with the care management team to address educational, disease management, and care coordination needs  . Begin or continue self health monitoring activities as directed today Measure and record cbg (blood glucose) one times daily, Measure and record blood pressure three times per week, and Measure and record weight daily . Call provider office for new or worsened signs and symptoms  . Call care management team with questions or concerns . Verbalize basic understanding of patient centered plan of care established today  Interventions related to CHF, HTN, and DMII:  . Evaluation of current treatment plans and patient's adherence to plan as established by provider . Assessed patient understanding of disease states . Assessed patient's education and care coordination needs . Provided disease specific education to patient  . Collaborated with appropriate clinical care team members regarding patient needs . Patient states that she has not checked her BP or her weight today.  Her blood sugar was 104.  She states that she is taking  her medications.   . She is taking her medications.  She no longer goes to the senior center.  She  does minimal exercise due to the weather.  Discussed maybe just doing some small exercises in the home. Discussed signs and symptoms of HF and HTN she denies any symptoms.   Patient Goals/Self Care Activities related to CHF, HTN, and DMII:  . check blood sugar at prescribed times . check blood sugar if I feel it is too high or too low . take the blood sugar meter to all doctor visits . schedule appointment with eye doctor . check feet daily for cuts, sores or redness . trim toenails straight across . wash and dry feet carefully every day . wear comfortable, cotton socks . wear comfortable, well-fitting shoes . Take Heart Failure Medications as prescribed . Weigh daily and record (notify MD with 3 lb weight gain over night or 5 lb in a week) . Follow CHF Action Plan . Adhere to low sodium diet . Call's provider office for new concerns, questions, or BP outside discussed parameters . Checks BP and records as discussed  Follow up Plan: The care management team will reach out to the patient again over the next 21 days.       Review of patient status, including review of consultants reports, relevant laboratory and other test results, and collaboration with appropriate care team members and the patient's provider was performed as part of comprehensive patient evaluation and provision of chronic care management services.  SDOH (Social Determinants of Health) assessments performed: No See Care Plan activities for detailed interventions related to [facility]       The care management team will reach out to the patient again over the next 21 days.    [name] RN, BSN, [facility] Care Management Coordinator [place] Phone: [phone] Fax: [phone]

## 2020-02-22 NOTE — Patient Instructions (Signed)
Visit Information  Goals Addressed              This Visit's Progress   .  I check my blood sugars, BP and weight daily" (pt-stated)        Patient Goals/Self Care Activities related to CHF, HTN, and DMII:  . check blood sugar at prescribed times . check blood sugar if I feel it is too high or too low . take the blood sugar meter to all doctor visits . schedule appointment with eye doctor . check feet daily for cuts, sores or redness . trim toenails straight across . wash and dry feet carefully every day . wear comfortable, cotton socks . wear comfortable, well-fitting shoes . Take Heart Failure Medications as prescribed . Weigh daily and record (notify MD with 3 lb weight gain over night or 5 lb in a week) . Follow CHF Action Plan . Adhere to low sodium diet . Call's provider office for new concerns, questions, or BP outside discussed parameters . Checks BP and records as discussed       Ms. Davilla was given information about Care Management services today including:  1. Care Management services include personalized support from designated clinical staff supervised by her physician, including individualized plan of care and coordination with other care providers 2. 24/7 contact phone numbers for assistance for urgent and routine care needs. 3. The patient may stop CCM services at any time (effective at the end of the month) by phone call to the office staff.  Patient agreed to services and verbal consent obtained.   The patient verbalized understanding of instructions, educational materials, and care plan provided today and declined offer to receive copy of patient instructions, educational materials, and care plan.   The care management team will reach out to the patient again over the next 21 days.   [name] RN, BSN, [facility] Care Management Coordinator [place] Phone: [phone] Fax: [phone]

## 2020-02-26 ENCOUNTER — Ambulatory Visit: Payer: Medicare Other | Attending: Internal Medicine

## 2020-02-26 DIAGNOSIS — Z23 Encounter for immunization: Secondary | ICD-10-CM

## 2020-02-26 NOTE — Progress Notes (Signed)
   Covid-19 Vaccination Clinic  Name:  Desiree Parker    MRN: [id number] DOB: 05/28/1945  02/26/2020  [name] was observed post Covid-19 immunization for 15 minutes without incident. She was provided with Vaccine Information Sheet and instruction to access the V-Safe system.   [name] was instructed to call 911 with any severe reactions post vaccine: [name] Difficulty breathing  . Swelling of face and throat  . A fast heartbeat  . A bad rash all over body  . Dizziness and weakness   Immunizations Administered    Name Date Dose VIS Date Route   Pfizer COVID-19 Vaccine 02/26/2020  5:10 PM 0.3 mL 01/30/2020 Intramuscular   Manufacturer: [place]   Lot: [id number]   [place]: [id number]

## 2020-02-27 ENCOUNTER — Other Ambulatory Visit: Payer: Self-pay | Admitting: Family Medicine

## 2020-02-27 DIAGNOSIS — M810 Age-related osteoporosis without current pathological fracture: Secondary | ICD-10-CM

## 2020-03-13 ENCOUNTER — Other Ambulatory Visit: Payer: Self-pay | Admitting: Family Medicine

## 2020-03-13 DIAGNOSIS — J4489 Other specified chronic obstructive pulmonary disease: Secondary | ICD-10-CM

## 2020-03-13 DIAGNOSIS — J449 Chronic obstructive pulmonary disease, unspecified: Secondary | ICD-10-CM

## 2020-03-25 ENCOUNTER — Other Ambulatory Visit: Payer: Self-pay | Admitting: Family Medicine

## 2020-03-25 MED ORDER — METFORMIN HCL ER 500 MG PO TB24
500.0000 mg | ORAL_TABLET | ORAL | 2 refills | Status: DC
Start: 2020-03-25 — End: 2020-11-17

## 2020-03-25 MED ORDER — CALCIUM CARBONATE-VITAMIN D 500-200 MG-UNIT PO TABS: 1.0000 | ORAL_TABLET | Freq: Two times a day (BID) | ORAL | 4 refills | Status: AC

## 2020-03-25 NOTE — Telephone Encounter (Signed)
Need med refill (Metformin and OscalD)

## 2020-04-01 ENCOUNTER — Encounter: Payer: Self-pay | Admitting: Family Medicine

## 2020-04-01 ENCOUNTER — Ambulatory Visit (INDEPENDENT_AMBULATORY_CARE_PROVIDER_SITE_OTHER): Payer: Medicare Other | Admitting: Family Medicine

## 2020-04-01 ENCOUNTER — Other Ambulatory Visit: Payer: Self-pay

## 2020-04-01 VITALS — BP 155/75 | HR 77 | Ht 60.0 in | Wt 197.4 lb

## 2020-04-01 DIAGNOSIS — J449 Chronic obstructive pulmonary disease, unspecified: Secondary | ICD-10-CM

## 2020-04-01 DIAGNOSIS — E1165 Type 2 diabetes mellitus with hyperglycemia: Secondary | ICD-10-CM | POA: Diagnosis not present

## 2020-04-01 DIAGNOSIS — I10 Essential (primary) hypertension: Secondary | ICD-10-CM

## 2020-04-01 LAB — POCT GLYCOSYLATED HEMOGLOBIN (HGB A1C): Hemoglobin A1C: 5.8 % — AB (ref 4.0–5.6)

## 2020-04-01 NOTE — Progress Notes (Signed)
    SUBJECTIVE:   CHIEF COMPLAINT / HPI: She takes pre-packed medication supervised by her son. I spoke with her son over the telephone during this visit.  DM2/HTN/HLD:She is here for follow-up. Currently on Lipitor 20 mg qd, Lisinopril 10 mg qd, and Metformin 500 mg QOD. No concerns today.  COPD:Compliant with her Spiriva and Breo Ellipta. Uses albuterol less often as needed.  Obese:Not getting a lot of exercise.   PERTINENT  PMH / PSH: PMX reviewed  OBJECTIVE:   Vitals:   04/01/20 1135 04/01/20 1152 04/01/20 1153  BP: (!) 148/70 (!) 159/65 (!) 155/75  Pulse: 77    SpO2: 97%    Weight: 197 lb 6.4 oz (89.5 kg)    Height: 5' (1.524 m)      Physical Exam Vitals and nursing note reviewed.  Cardiovascular:     Rate and Rhythm: Normal rate and regular rhythm.     Heart sounds: Normal heart sounds. No murmur heard.   Pulmonary:     Effort: Pulmonary effort is normal. No respiratory distress.     Breath sounds: Normal breath sounds. No stridor. No wheezing or rhonchi.  Abdominal:     General: Bowel sounds are normal. There is no distension.     Palpations: Abdomen is soft.     Tenderness: There is no abdominal tenderness.  Musculoskeletal:     Right lower leg: No edema.     Left lower leg: No edema.  Neurological:     Mental Status: She is oriented to person, place, and time.      ASSESSMENT/PLAN:   Diabetes mellitus, type II A1C checked today and is at goal 5.8 Continue Metformin 500 mg QOD. F/U for A1C check in 6 months.  HYPERTENSION, BENIGN SYSTEMIC BP remains elevated after I checked multiple times during this visit. She takes her Lisinopril regularly at the same time of the day. She does not monitor her BP at home. I recommended exercise and diet control with reduce salt intake. Monitor BP for 1-2 weeks and f/u with me then for reassessment. F/U appointment was made during this visit.  COPD (chronic obstructive pulmonary disease) (HCC) No acute  exacerbation. Continue current regimen.  Morbid obesity ([place]) Increase weight since her last visit. Diet and exercise counseling done. I also discussed this with her son over the telephone. Monitor weight closely.   HM: I spoke with the patient and her son. They both confirmed that she got all her shot including Shingrix and her COVID-19 booster shot at [facility], they will return with her vaccination report from her pharmacy.  Andrena Mews, MD [place]

## 2020-04-01 NOTE — Assessment & Plan Note (Signed)
BP remains elevated after I checked multiple times during this visit. She takes her Lisinopril regularly at the same time of the day. She does not monitor her BP at home. I recommended exercise and diet control with reduce salt intake. Monitor BP for 1-2 weeks and f/u with me then for reassessment. F/U appointment was made during this visit.

## 2020-04-01 NOTE — Patient Instructions (Signed)
It was nice seeing you today. Your A1C is still within goal for you. Continue Metformin every other day as before and we will repeat your A1C in 6 months. Please work on exercise and diet control for weight maintenance.  We discussed your shingles vaccination status, please return soon with a copy of your vaccination report from the pharmacy.

## 2020-04-01 NOTE — Assessment & Plan Note (Signed)
Increase weight since her last visit. Diet and exercise counseling done. I also discussed this with her son over the telephone. Monitor weight closely.

## 2020-04-01 NOTE — Assessment & Plan Note (Signed)
No acute exacerbation. Continue current regimen.

## 2020-04-01 NOTE — Assessment & Plan Note (Signed)
A1C checked today and is at goal 5.8 Continue Metformin 500 mg QOD. F/U for A1C check in 6 months.

## 2020-04-16 ENCOUNTER — Ambulatory Visit (INDEPENDENT_AMBULATORY_CARE_PROVIDER_SITE_OTHER): Payer: Medicare Other | Admitting: Family Medicine

## 2020-04-16 ENCOUNTER — Encounter: Payer: Self-pay | Admitting: Family Medicine

## 2020-04-16 ENCOUNTER — Other Ambulatory Visit: Payer: Self-pay

## 2020-04-16 DIAGNOSIS — I10 Essential (primary) hypertension: Secondary | ICD-10-CM

## 2020-04-16 DIAGNOSIS — I5032 Chronic diastolic (congestive) heart failure: Secondary | ICD-10-CM | POA: Diagnosis not present

## 2020-04-16 MED ORDER — LISINOPRIL 20 MG PO TABS
20.0000 mg | ORAL_TABLET | Freq: Every day | ORAL | 1 refills | Status: DC
Start: 2020-04-16 — End: 2020-05-02

## 2020-04-16 NOTE — Progress Notes (Signed)
     SUBJECTIVE:   CHIEF COMPLAINT / HPI:   Hypertension This is a chronic problem. The problem has been gradually worsening since onset. The problem is uncontrolled. Pertinent negatives include no blurred vision, chest pain, palpitations or peripheral edema. Risk factors for coronary artery disease include diabetes mellitus. Past treatments include ACE inhibitors (She is compliant with her Lisinopril 10 mg daily. Her last dose was this morning at 6 AM). The current treatment provides moderate improvement. There are no compliance problems.   Does not check BP regularly at home. She does have the BP machine at home.  CHFd: SOB improved. No other concerns.  PERTINENT  PMH / PSH: FHx reviewed  OBJECTIVE:   Vitals:   04/16/20 0946 04/16/20 0955 04/16/20 0957  BP: (!) 170/80 (!) 154/79 (!) 152/70  Pulse: 80    SpO2: 98%    Weight: 197 lb 3.2 oz (89.4 kg)      Physical Exam Vitals and nursing note reviewed.  Cardiovascular:     Rate and Rhythm: Normal rate and regular rhythm.     Heart sounds: Normal heart sounds. No murmur heard.   Pulmonary:     Effort: Pulmonary effort is normal. No respiratory distress.     Breath sounds: Normal breath sounds. No wheezing.  Abdominal:     General: Abdomen is flat. Bowel sounds are normal. There is no distension.     Palpations: Abdomen is soft. There is no mass.     Tenderness: There is no abdominal tenderness.  Musculoskeletal:     Right lower leg: No edema.     Left lower leg: No edema.      ASSESSMENT/PLAN:   HYPERTENSION, BENIGN SYSTEMIC BP remains elevated. Increased Lisinopril to 20 mg qd. Home BP monitoring discussed and BP parameters given. F/U in 4 weeks for reassessment.  Chronic diastolic heart failure (HCC) No cardiac symptoms.     Janit Pagan, MD [facility]

## 2020-04-16 NOTE — Patient Instructions (Signed)
It was nice seeing you today. Your BP is above goal.  We will increase your Lisinopril to 20 mg daily. You can take two tablet of the 10 mg and then pick up the new prescription.  Please check your BP at home 1-2 times daily and keep a diary of your BP for your next appointment with me in 4 weeks.   BP range is 110/60 - 140/80. If BP is less than 110/60, please hold your BP medication and give me a call

## 2020-04-16 NOTE — Assessment & Plan Note (Signed)
BP remains elevated. Increased Lisinopril to 20 mg qd. Home BP monitoring discussed and BP parameters given. F/U in 4 weeks for reassessment.

## 2020-04-16 NOTE — Assessment & Plan Note (Signed)
No cardiac symptoms.  

## 2020-05-02 ENCOUNTER — Other Ambulatory Visit: Payer: Self-pay | Admitting: Family Medicine

## 2020-05-02 DIAGNOSIS — Z9114 Patient's other noncompliance with medication regimen: Secondary | ICD-10-CM

## 2020-05-06 ENCOUNTER — Other Ambulatory Visit: Payer: Self-pay | Admitting: Family Medicine

## 2020-05-06 DIAGNOSIS — J449 Chronic obstructive pulmonary disease, unspecified: Secondary | ICD-10-CM

## 2020-05-06 DIAGNOSIS — R0600 Dyspnea, unspecified: Secondary | ICD-10-CM

## 2020-05-13 ENCOUNTER — Encounter: Payer: Self-pay | Admitting: Family Medicine

## 2020-05-13 ENCOUNTER — Ambulatory Visit (INDEPENDENT_AMBULATORY_CARE_PROVIDER_SITE_OTHER): Payer: Medicare Other | Admitting: Family Medicine

## 2020-05-13 ENCOUNTER — Other Ambulatory Visit: Payer: Self-pay

## 2020-05-13 DIAGNOSIS — I10 Essential (primary) hypertension: Secondary | ICD-10-CM | POA: Diagnosis not present

## 2020-05-13 NOTE — Patient Instructions (Signed)
Blood Pressure Record Sheet To take your blood pressure, you will need a blood pressure machine. You can buy a blood pressure machine (blood pressure monitor) at your clinic, drug store, or online. When choosing one, consider:  An automatic monitor that has an arm cuff.  A cuff that wraps snugly around your upper arm. You should be able to fit only one finger between your arm and the cuff.  A device that stores blood pressure reading results.  Do not choose a monitor that measures your blood pressure from your wrist or finger. Follow your health care provider's instructions for how to take your blood pressure. To use this form:  Get one reading in the morning (a.m.) before you take any medicines.  Get one reading in the evening (p.m.) before supper.  Take at least 2 readings with each blood pressure check. This makes sure the results are correct. Wait 1-2 minutes between measurements.  Write down the results in the spaces on this form.  Repeat this once a week, or as told by your health care provider.  Make a follow-up appointment with your health care provider to discuss the results. Blood pressure log Date: _______________________  a.m. _____________________(1st reading) _____________________(2nd reading)  p.m. _____________________(1st reading) _____________________(2nd reading) Date: _______________________  a.m. _____________________(1st reading) _____________________(2nd reading)  p.m. _____________________(1st reading) _____________________(2nd reading) Date: _______________________  a.m. _____________________(1st reading) _____________________(2nd reading)  p.m. _____________________(1st reading) _____________________(2nd reading) Date: _______________________  a.m. _____________________(1st reading) _____________________(2nd reading)  p.m. _____________________(1st reading) _____________________(2nd reading) Date: _______________________  a.m.  _____________________(1st reading) _____________________(2nd reading)  p.m. _____________________(1st reading) _____________________(2nd reading) This information is not intended to replace advice given to you by your health care provider. Make sure you discuss any questions you have with your health care provider. Document Revised: 07/18/2019 Document Reviewed: 07/18/2019 Elsevier Patient Education  2021 Elsevier Inc.  

## 2020-05-13 NOTE — Assessment & Plan Note (Signed)
Systolic BP slightly elevated. She will continue to work on diet and exercise as tolerated. Continue current dose of Lisinopril for now. I will recheck her BP in 4-8 weeks. I encouraged her to monitor BP closely at home. May return sooner if BP remains elevated.

## 2020-05-13 NOTE — Progress Notes (Signed)
    SUBJECTIVE:   CHIEF COMPLAINT / HPI:   HTN: here for follow-up. She is compliant with Lisinopril 20 mg qd. She does not check her BP at home regularly.  PERTINENT  PMH / PSH: PMX reviewed.  OBJECTIVE:   Vitals:   05/13/20 1051 05/13/20 1106  BP: (!) 148/70 (!) 144/64  Pulse: 60 65  SpO2: 98%   Weight: 194 lb 12.8 oz (88.4 kg)   Height: 5' (1.524 m)     Physical Exam Vitals and nursing note reviewed.  Cardiovascular:     Rate and Rhythm: Normal rate and regular rhythm.     Pulses: Normal pulses.     Heart sounds: Normal heart sounds. No murmur heard.   Pulmonary:     Effort: Pulmonary effort is normal. No respiratory distress.     Breath sounds: Normal breath sounds. No wheezing.  Abdominal:     General: Abdomen is flat. Bowel sounds are normal. There is no distension.     Palpations: There is no mass.     Tenderness: There is no abdominal tenderness.  Musculoskeletal:     Right lower leg: No edema.     Left lower leg: No edema.  Neurological:     Mental Status: She is alert.      ASSESSMENT/PLAN:   HYPERTENSION, BENIGN SYSTEMIC Systolic BP slightly elevated. She will continue to work on diet and exercise as tolerated. Continue current dose of Lisinopril for now. I will recheck her BP in 4-8 weeks. I encouraged her to monitor BP closely at home. May return sooner if BP remains elevated.      Andrena Mews, MD [place]

## 2020-05-25 ENCOUNTER — Other Ambulatory Visit: Payer: Self-pay | Admitting: Family Medicine

## 2020-05-25 DIAGNOSIS — J449 Chronic obstructive pulmonary disease, unspecified: Secondary | ICD-10-CM

## 2020-05-27 ENCOUNTER — Ambulatory Visit: Payer: Medicare Other
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
## 2020-05-27 NOTE — Patient Instructions (Signed)
Visit Information  Ms. Decamp  it was nice speaking with you. Please call me directly [phone] if you have questions about the goals we discussed.  Goals Addressed              This Visit's Progress   .  I check my blood sugars, BP and weight daily" (pt-stated)        Timeframe:  Long-Range Goal Priority:  High Start Date:    08/03/19                         Expected End Date:    09/09/20                    Patient Goals/Self Care Activities related to CHF, HTN, and DMII:  . check blood sugar at prescribed times . Take Heart Failure Medications as prescribed . Weigh daily and record (notify MD with 3 lb weight gain over night or 5 lb in a week) . Follow CHF Action Plan . Adhere to low sodium diet . Call's provider office for new concerns, questions, or BP outside discussed parameters . Checks BP and records as discussed       The patient verbalized understanding of instructions, educational materials, and care plan provided today and declined offer to receive copy of patient instructions, educational materials, and care plan.   Follow up Plan: Patient would like continued follow-up.  CCM RNCM will outreach the patient within the next 14 days.. Patient will call office if needed prior to next encounter  [name], RN

## 2020-05-28 ENCOUNTER — Other Ambulatory Visit: Payer: Self-pay

## 2020-05-28 ENCOUNTER — Ambulatory Visit (INDEPENDENT_AMBULATORY_CARE_PROVIDER_SITE_OTHER): Payer: Medicare Other | Admitting: Podiatry

## 2020-05-28 ENCOUNTER — Encounter: Payer: Self-pay | Admitting: Podiatry

## 2020-05-28 DIAGNOSIS — M79675 Pain in left toe(s): Secondary | ICD-10-CM

## 2020-05-28 DIAGNOSIS — M79674 Pain in right toe(s): Secondary | ICD-10-CM

## 2020-05-28 DIAGNOSIS — B351 Tinea unguium: Secondary | ICD-10-CM | POA: Diagnosis not present

## 2020-05-28 DIAGNOSIS — E1165 Type 2 diabetes mellitus with hyperglycemia: Secondary | ICD-10-CM

## 2020-05-28 NOTE — Progress Notes (Signed)
This patient returns to my office for at risk foot care.  This patient requires this care by a professional since this patient will be at risk due to having diabetes.  This patient is unable to cut nails herself since the patient cannot reach her nails.These nails are painful walking and wearing shoes.  This patient presents for at risk foot care today.  General Appearance  Alert, conversant and in no acute stress.  Vascular  Dorsalis pedis  Are weakly  palpable  bilaterally. Posterior tibial pulses are absent  B/L. Capillary return is within normal limits  bilaterally. Temperature is within normal limits  bilaterally.  Neurologic  Senn-Weinstein monofilament wire test within normal limits  bilaterally. Muscle power within normal limits bilaterally.  Nails Thick disfigured discolored nails with subungual debris  hallux nails  B/L. No evidence of bacterial infection or drainage bilaterally.  Orthopedic  No limitations of motion  feet .  No crepitus or effusions noted.  No bony pathology or digital deformities noted.  HAV  B/L.  Skin  normotropic skin with no porokeratosis noted bilaterally.  No signs of infections or ulcers noted.     Onychomycosis  Pain in right toes  Pain in left toes  Consent was obtained for treatment procedures.   Mechanical debridement of nails  hallux   bilaterally performed with a nail nipper.  Filed with dremel without incident.  RTC 3 months  Patient had problem with dremel usage.  Her nails grew 2-5 left foot minimally.     Return office visit   3 months                   Told patient to return for periodic foot care and evaluation due to potential at risk complications.   Gardiner Barefoot DPM

## 2020-06-10 ENCOUNTER — Ambulatory Visit: Payer: Medicare Other
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
## 2020-06-10 NOTE — Patient Instructions (Signed)
Visit Information  Desiree Parker  it was nice speaking with you. Please call me directly [phone] if you have questions about the goals we discussed.  Goals Addressed              This Visit's Progress   .  I check my blood sugars, BP and weight daily" (pt-stated)   Not on track     Timeframe:  Long-Range Goal Priority:  High Start Date:    08/03/19                         Expected End Date:    09/09/20              Patient Goals/Self Care Activities related to CHF, HTN, and DMII:  . check blood sugar at prescribed times . Take Heart Failure Medications as prescribed . Weigh daily and record (notify MD with 3 lb weight gain over night or 5 lb in a week) . Follow CHF Action Plan . Adhere to low sodium diet . Call's provider office for new concerns, questions, or BP outside discussed parameters . Checks BP and records as discussed       The patient verbalized understanding of instructions, educational materials, and care plan provided today and declined offer to receive copy of patient instructions, educational materials, and care plan.   Follow up Plan: Patient would like continued follow-up.  CCM RNCM will outreach the patient within the next 14 days.. Patient will call office if needed prior to next encounter  [name], RN

## 2020-06-16 ENCOUNTER — Other Ambulatory Visit: Payer: Self-pay | Admitting: Family Medicine

## 2020-06-16 DIAGNOSIS — Z1231 Encounter for screening mammogram for malignant neoplasm of breast: Secondary | ICD-10-CM

## 2020-06-24 ENCOUNTER — Other Ambulatory Visit: Payer: Self-pay | Admitting: Family Medicine

## 2020-06-24 ENCOUNTER — Ambulatory Visit: Payer: Medicare Other

## 2020-06-24 DIAGNOSIS — J449 Chronic obstructive pulmonary disease, unspecified: Secondary | ICD-10-CM

## 2020-06-24 NOTE — Patient Instructions (Signed)
Visit Information  Ms. Newland  it was nice speaking with you. Please call me directly [phone] if you have questions about the goals we discussed.  Goals Addressed              This Visit's Progress   .  "If I do to much I can't breath well" (pt-stated)        Current Barriers:  [name] Knowledge deficits related to basic COPD self care/management  Nurse Case Manager Clinical Goal(s):  [name] Over the next 14 days, patient will verbalize understanding of plan for COPD . Over the next 60 days, patient will work with RN Case Manager to address needs related to COPD . Over the next 60 days, patient will attend all scheduled medical appointments: regarding her COPD.  Interventions:  . Evaluation of current treatment plan related to COPD and patient's adherence to plan as established by provider. . Provided education to patient re: COPD action Plan . Reviewed medications with patient and discussed inhaler . Collaborated with front desk regarding making an appointment for patient visit. Patient appointment on 03/07/19 @ 850 am.  Patient has been notified. . Discussed plans with patient for ongoing care management follow up and provided patient with direct contact information for care management team  Patient Self Care Activities:  . Self administers medications as prescribed . Performs ADL's independently . Performs IADL's independently . Unable to independently symptoms of shortness of breath with exertion  Initial goal documentation    .  I check my blood sugars, BP and weight daily" (pt-stated)        Timeframe:  Long-Range Goal Priority:  High Start Date:    08/03/19                         Expected End Date:    09/09/20   Patient Goals/Self Care Activities related to CHF, HTN, and DMII:  . check blood sugar at prescribed times . Take Heart Failure Medications as prescribed . Weigh daily and record (notify MD with 3 lb weight gain over night or 5 lb in a week) . Follow CHF Action  Plan . Adhere to low sodium diet . Call's provider office for new concerns, questions, or BP outside discussed parameters . Checks BP and records as discussed       The patient verbalized understanding of instructions, educational materials, and care plan provided today and declined offer to receive copy of patient instructions, educational materials, and care plan.   Follow up Plan: Patient would like continued follow-up.  CCM RNCM will outreach the patient within the next 30 days.. Patient will call office if needed prior to next encounter  [name], RN
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
## 2020-07-05 ENCOUNTER — Other Ambulatory Visit: Payer: Self-pay | Admitting: Family Medicine

## 2020-07-15 ENCOUNTER — Encounter: Payer: Self-pay | Admitting: Family Medicine

## 2020-07-16 ENCOUNTER — Encounter: Payer: Self-pay | Admitting: Family Medicine

## 2020-07-16 ENCOUNTER — Ambulatory Visit: Payer: Medicare Other

## 2020-07-16 DIAGNOSIS — Z87891 Personal history of nicotine dependence: Secondary | ICD-10-CM | POA: Insufficient documentation

## 2020-07-17 NOTE — Patient Instructions (Signed)
Visit Information  Ms. Valtierra  it was nice speaking with you. Please call me directly [phone] if you have questions about the goals we discussed.  Goals Addressed              This Visit's Progress   .  COMPLETED: "If I do to much I can't breath well" (pt-stated)        Current Barriers:  [name] Knowledge deficits related to basic COPD self care/management  Nurse Case Manager Clinical Goal(s):  [name] Over the next 14 days, patient will verbalize understanding of plan for COPD . Over the next 60 days, patient will work with RN Case Manager to address needs related to COPD . Over the next 60 days, patient will attend all scheduled medical appointments: regarding her COPD.  Interventions:  . Evaluation of current treatment plan related to COPD and patient's adherence to plan as established by provider. . Provided education to patient re: COPD action Plan . Reviewed medications with patient and discussed inhaler . Collaborated with front desk regarding making an appointment for patient visit. Patient appointment on 03/07/19 @ 850 am.  Patient has been notified. . Discussed plans with patient for ongoing care management follow up and provided patient with direct contact information for care management team  Patient Self Care Activities:  . Self administers medications as prescribed . Performs ADL's independently . Performs IADL's independently . Unable to independently symptoms of shortness of breath with exertion  Initial goal documentation    .  I check my blood sugars, BP and weight daily" (pt-stated)   Not on track     Timeframe:  Long-Range Goal Priority:  High Start Date:    08/03/19                         Expected End Date:    10/09/20   Patient Goals/Self Care Activities related to CHF, HTN, and DMII:  . check blood sugar at prescribed times . Take Heart Failure Medications as prescribed . Weigh daily and record (notify MD with 3 lb weight gain over night or 5 lb in a  week) . Follow CHF Action Plan . Adhere to low sodium diet . Call's provider office for new concerns, questions, or BP outside discussed parameters . Checks BP and records as discussed       The patient verbalized understanding of instructions, educational materials, and care plan provided today and declined offer to receive copy of patient instructions, educational materials, and care plan.   Follow up Plan: Patient would like continued follow-up.  CCM rncm will outreach the patient within the next 4 WEEKS.  Patient will call office if needed prior to next encounter  [name], RN  [phone]
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
## 2020-07-21 ENCOUNTER — Other Ambulatory Visit: Payer: Self-pay

## 2020-07-21 ENCOUNTER — Ambulatory Visit
Admission: RE | Admit: 2020-07-21 | Discharge: 2020-07-21 | Disposition: A | Discharge status: Home / Self Care | Payer: Medicare Other | Source: Ambulatory Visit | Attending: Family Medicine | Admitting: Family Medicine

## 2020-07-21 DIAGNOSIS — Z1231 Encounter for screening mammogram for malignant neoplasm of breast: Secondary | ICD-10-CM

## 2020-07-31 ENCOUNTER — Other Ambulatory Visit: Payer: Self-pay | Admitting: Family Medicine

## 2020-07-31 DIAGNOSIS — K219 Gastro-esophageal reflux disease without esophagitis: Secondary | ICD-10-CM

## 2020-08-14 ENCOUNTER — Telehealth: Payer: Medicare Other

## 2020-08-16 ENCOUNTER — Telehealth: Payer: Self-pay

## 2020-08-16 NOTE — Telephone Encounter (Signed)
   RN Case Manager Care Management   Phone Outreach    08/16/2020 Late Entry Name: Desiree Parker MRN: [id number] DOB: 09-04-1945  Desiree Parker is a 75 y.o. year old female who is a primary care patient of Eniola, Phill Myron, MD .   Telephone outreach was unsuccessful Unable to leave a HIPPA compliant phone message due to voice mail not set up.  Plan:Appointment was rescheduled with CCM RNCM @ 3pm  Review of patient status, including review of consultants reports, relevant laboratory and other test results, and collaboration with appropriate care team members and the patient's provider was performed as part of comprehensive patient evaluation and provision of care management services.    [name] RN, BSN, [facility] Care Management Coordinator [place] Phone: [phone] I Fax: [phone]

## 2020-08-20 ENCOUNTER — Other Ambulatory Visit: Payer: Self-pay | Admitting: Family Medicine

## 2020-08-20 DIAGNOSIS — M810 Age-related osteoporosis without current pathological fracture: Secondary | ICD-10-CM

## 2020-08-26 ENCOUNTER — Telehealth: Payer: Medicare Other

## 2020-08-26 ENCOUNTER — Telehealth: Payer: Self-pay

## 2020-08-26 ENCOUNTER — Other Ambulatory Visit: Payer: Self-pay | Admitting: Family Medicine

## 2020-08-26 MED ORDER — LISINOPRIL 20 MG PO TABS: 20.0000 mg | ORAL_TABLET | Freq: Every day | ORAL | 1 refills | Status: DC

## 2020-08-26 NOTE — Telephone Encounter (Signed)
   RN Case Manager Care Management   Phone Outreach    08/26/2020 Name: Desiree Parker MRN: [id number] DOB: 03/23/1946  Desiree Parker is a 75 y.o. year old female who is a primary care patient of [name], MD .    2nd unsuccessful telephone outreach attempt.  If unable to reach patient by phone on the 3rd attempt, will discontinue outreach calls but will be available at any time to provide services. Unable to leave a HIPPA compliant phone message due to voice mail not set up.  Plan:Will route chart to Care Guide to see if patient would like to reschedule phone appointment   Review of patient status, including review of consultants reports, relevant laboratory and other test results, and collaboration with appropriate care team members and the patient's provider was performed as part of comprehensive patient evaluation and provision of care management services.    [name] RN, BSN, [facility] Care Management Coordinator [place] Phone: [phone] I Fax: [phone]

## 2020-08-29 ENCOUNTER — Telehealth: Payer: Self-pay | Admitting: *Deleted

## 2020-08-29 NOTE — Chronic Care Management (AMB) (Signed)
  Care Management   Note  08/29/2020 Name: [name] MRN: [id number] DOB: 1945-10-13  Desiree Parker is a 75 y.o. year old female who is a primary care patient of [name], MD and is actively engaged with the care management team. I reached out to [name] by phone today to assist with re-scheduling a follow up visit with the RN Case Manager  Follow up plan: Unsuccessful telephone outreach attempt made. A HIPAA compliant phone message was left for the patient providing contact information and requesting a return call. The care management team will reach out to the patient again over the next 7 days. If patient returns call to provider office, please advise to call [place] at [phone].  [place] Management

## 2020-09-04 NOTE — Chronic Care Management (AMB) (Signed)
  Care Management   Note  09/04/2020 Name: Desiree Parker MRN: [id number] DOB: 1945-10-26  Desiree Parker is a 75 y.o. year old female who is a primary care patient of [name], MD and is actively engaged with the care management team. I reached out to [name] by phone today to assist with re-scheduling a follow up visit with the RN Case Manager.  Follow up plan: Third unsuccessful telephone outreach attempt made. A HIPAA compliant phone message was left for the patient providing contact information and requesting a return call.  The care management team will reach out to the patient again over the next 7 days.  If patient returns call to provider office, please advise to call [place] at [phone].  [place], [place] [address] Direct Dial: [phone] [email or web address] Website: [email or web address]

## 2020-09-05 ENCOUNTER — Ambulatory Visit: Payer: Medicare Other | Admitting: Podiatry

## 2020-09-12 NOTE — Chronic Care Management (AMB) (Signed)
  Care Management   Note  09/12/2020 Name: Desiree Parker MRN: [id number] DOB: Sep 20, 1945  Desiree Parker is a 75 y.o. year old female who is a primary care patient of [name], MD and is actively engaged with the care management team. I reached out to [name] by phone today to assist with re-scheduling a follow up visit with the RN Case Manager  Follow up plan: Telephone appointment with care management team member scheduled for:10/07/2020  Desiree Parker  Care Guide, Embedded Care Coordination [place]  Care Management

## 2020-09-12 NOTE — Telephone Encounter (Signed)
Rescheduled

## 2020-10-07 ENCOUNTER — Ambulatory Visit: Payer: Medicare Other | Admitting: Licensed Clinical Social Worker

## 2020-10-07 DIAGNOSIS — Z7689 Persons encountering health services in other specified circumstances: Secondary | ICD-10-CM

## 2020-10-07 NOTE — Chronic Care Management (AMB) (Signed)
    Clinical Social Work  Care Management   Phone Outreach    10/07/2020 Name: Desiree Parker MRN: [id number] DOB: November 25, 1945  Desiree Parker is a 75 y.o. year old female who is a primary care patient of Eniola, Phill Myron, MD .   CCM LCSW providing coverage.  F/U phone appointment scheduled with CCM RN today to assess needs, and progress with care plan goals.  Patient reports doing well with no immediate needs or concerns for CCM RN today.  No upcoming appointments scheduled.   Plan:Appointment was rescheduled with CCM RN on July 18th  Review of patient status, including review of consultants reports, relevant laboratory and other test results, and collaboration with appropriate care team members and the patient's provider was performed as part of comprehensive patient evaluation and provision of care management services.    Casimer Lanius, [place] / [place]   [phone] 9:19 AM

## 2020-10-07 NOTE — Patient Instructions (Signed)
You new appointment with Tressia Miners has been rescheduled for July 18th   Casimer Lanius, [place] Management & Coordination  [phone]

## 2020-10-20 ENCOUNTER — Other Ambulatory Visit: Payer: Self-pay | Admitting: Family Medicine

## 2020-10-20 DIAGNOSIS — Z9114 Patient's other noncompliance with medication regimen: Secondary | ICD-10-CM

## 2020-10-27 ENCOUNTER — Ambulatory Visit: Payer: Medicare Other
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
## 2020-10-27 NOTE — Patient Instructions (Signed)
Visit Information  Ms. Donnan  it was nice speaking with you. Please call me directly [phone] if you have questions about the goals we discussed.   Goals Addressed               This Visit's Progress     I check my blood sugars, BP and weight daily" (pt-stated)        Timeframe:  Long-Range Goal Priority:  High Start Date:    08/03/19                         Expected End Date:    12/10/20   Patient Goals/Self Care Activities related to CHF, HTN, and DMII:  check blood sugar at prescribed times Take Heart Failure Medications as prescribed Weigh daily and record (notify MD with 3 lb weight gain over night or 5 lb in a week) Follow CHF Action Plan Adhere to low sodium diet Call's provider office for new concerns, questions, or BP outside discussed parameters Checks BP and records as discussed        The patient verbalized understanding of instructions, educational materials, and care plan provided today and declined offer to receive copy of patient instructions, educational materials, and care plan.   Follow up Plan: Patient would like continued follow-up.  CCM RNCM will outreach the patient within the next 7 days  Patient will call office if needed prior to next encounter  [name], RN  [phone]

## 2020-11-03 ENCOUNTER — Ambulatory Visit: Payer: Medicare Other

## 2020-11-03 NOTE — Patient Instructions (Signed)
Visit Information  Ms. Furlong  it was nice speaking with you. Please call me directly [phone] if you have questions about the goals we discussed.   Goals Addressed               This Visit's Progress     I check my blood sugars, BP and weight daily" (pt-stated)        Timeframe:  Long-Range Goal Priority:  High Start Date:    08/03/19                         Expected End Date:    01/09/21   Patient Goals/Self Care Activities related to CHF, HTN, and DMII:  check blood sugar at prescribed times Take Heart Failure Medications as prescribed Weigh daily and record (notify MD with 3 lb weight gain over night or 5 lb in a week) Follow CHF Action Plan Adhere to low sodium diet Call's provider office for new concerns, questions, or BP outside discussed parameters Checks BP and records as discussed        The patient verbalized understanding of instructions, educational materials, and care plan provided today and declined offer to receive copy of patient instructions, educational materials, and care plan.   Follow up Plan: Patient would like continued follow-up.  CCM RNCM will outreach the patient within the next 4 weeks  Patient will call office if needed prior to next encounter  [name], RN  [phone]
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
## 2020-11-15 ENCOUNTER — Other Ambulatory Visit: Payer: Self-pay | Admitting: Family Medicine

## 2020-11-15 DIAGNOSIS — M810 Age-related osteoporosis without current pathological fracture: Secondary | ICD-10-CM

## 2020-12-01 ENCOUNTER — Ambulatory Visit: Payer: Medicare Other
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
## 2020-12-01 NOTE — Patient Instructions (Signed)
Visit Information  Ms. Boccia  it was nice speaking with you. Please call me directly [phone] if you have questions about the goals we discussed.   Goals Addressed               This Visit's Progress     I check my blood sugars, BP and weight daily" (pt-stated)        Timeframe:  Long-Range Goal Priority:  High Start Date:    08/03/19                         Expected End Date:   01/09/21  Patient Goals/Self Care Activities related to CHF, HTN, and DMII:  check blood sugar at prescribed times Take Heart Failure Medications as prescribed Weigh daily and record (notify MD with 3 lb weight gain over night or 5 lb in a week) Follow CHF Action Plan Adhere to low sodium diet Call's provider office for new concerns, questions, or BP outside discussed parameters Checks BP and records as discussed  Why is this important?   You will be able to handle your symptoms better if you keep track of them.  Making some simple changes to your lifestyle will help.  Eating healthy is one thing you can do to take good care of yourself.       The patient verbalizes understanding of the information and instructions discussed today.  Our next appointment is scheduled for  12/17/20.   Please feel free to call me or the office if you have any questions or concerns.  [name] RN, BSN, [facility] Care Management Coordinator [place] Phone: [phone] I Fax: [phone]    12/17/20

## 2020-12-17 ENCOUNTER — Ambulatory Visit: Payer: Medicare Other

## 2020-12-17 NOTE — Patient Instructions (Signed)
Visit Information  Desiree Parker  it was nice speaking with you. Please call me directly [phone] if you have questions about the goals we discussed.   Goals Addressed               This Visit's Progress     I check my blood sugars, BP and weight daily" (pt-stated)        Timeframe:  Long-Range Goal Priority:  High Start Date:    08/03/19                         Expected End Date:   01/09/21  Patient Goals/Self Care Activities related to CHF, HTN, and DMII:  check blood sugar at prescribed times Take Heart Failure Medications as prescribed Weigh daily and record (notify MD with 3 lb weight gain over night or 5 lb in a week) Follow CHF Action Plan Adhere to low sodium diet Call's provider office for new concerns, questions, or BP outside discussed parameters Checks BP and records as discussed Patient states that she will resume checking her vitals.  Why is this important?   You will be able to handle your symptoms better if you keep track of them.  Making some simple changes to your lifestyle will help.  Eating healthy is one thing you can do to take good care of yourself.       The patient verbalizes understanding of the information and instructions discussed today.  Our next appointment is scheduled for  12/19/20.   Please feel free to call me or the office if you have any questions or concerns.  [name] RN, BSN, [facility] Care Management Coordinator [place] Phone: [phone] I Fax: [phone]
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
## 2020-12-18 ENCOUNTER — Other Ambulatory Visit: Payer: Self-pay | Admitting: Family Medicine

## 2020-12-18 DIAGNOSIS — E1165 Type 2 diabetes mellitus with hyperglycemia: Secondary | ICD-10-CM

## 2020-12-18 MED ORDER — ONETOUCH VERIO REFLECT W/DEVICE KIT: 1.0000 [IU] | PACK | Freq: Three times a day (TID) | 0 refills | Status: DC

## 2020-12-18 MED ORDER — ONETOUCH VERIO W/DEVICE KIT: PACK | 0 refills | Status: DC

## 2020-12-18 NOTE — Progress Notes (Unsigned)
Rx Onetouch Verio glucometer device sent to pharmacy

## 2020-12-19 ENCOUNTER — Ambulatory Visit: Payer: Medicare Other

## 2020-12-19 NOTE — Patient Instructions (Signed)
Visit Information  Ms. Dungan  it was nice speaking with you. Please call me directly [phone] if you have questions about the goals we discussed.   Goals Addressed               This Visit's Progress     I check my blood sugars, BP and weight daily" (pt-stated)        Timeframe:  Long-Range Goal Priority:  High Start Date:    08/03/19                         Expected End Date:   01/09/21  Patient Goals/Self Care Activities related to CHF, HTN, and DMII:  check blood sugar at prescribed times Take Heart Failure Medications as prescribed Weigh daily and record (notify MD with 3 lb weight gain over night or 5 lb in a week) Follow CHF Action Plan Adhere to low sodium diet Call's provider office for new concerns, questions, or BP outside discussed parameters Checks BP and records as discussed Patient will continue to make  an effort to check her vitals.  Why is this important?   You will be able to handle your symptoms better if you keep track of them.  Making some simple changes to your lifestyle will help.  Eating healthy is one thing you can do to take good care of yourself.        The patient verbalizes understanding of the information and instructions discussed today.  Our next appointment is scheduled for  01/16/21.   Please feel free to call me or the office if you have any questions or concerns.  [name] RN, BSN, [facility] Care Management Coordinator [place] Phone: [phone] I Fax: [phone]
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
## 2020-12-23 DIAGNOSIS — Z961 Presence of intraocular lens: Secondary | ICD-10-CM | POA: Diagnosis not present

## 2020-12-23 DIAGNOSIS — E119 Type 2 diabetes mellitus without complications: Secondary | ICD-10-CM | POA: Diagnosis not present

## 2021-01-16 ENCOUNTER — Ambulatory Visit: Payer: Medicare Other

## 2021-01-16 NOTE — Patient Instructions (Signed)
Visit Information  Desiree Parker  it was nice speaking with you. Please call me directly [phone] if you have questions about the goals we discussed.   Goals Addressed               This Visit's Progress     I check my blood sugars, BP and weight daily" (pt-stated)        Timeframe:  Long-Range Goal Priority:  High Start Date:    08/03/19                         Expected End Date:   02/08/21  Patient Goals/Self Care Activities related to CHF, HTN, and DMII:  check blood sugar at prescribed times Take Heart Failure Medications as prescribed Weigh daily and record (notify MD with 3 lb weight gain over night or 5 lb in a week) Follow CHF Action Plan Adhere to low sodium diet Call's provider office for new concerns, questions, or BP outside discussed parameters Checks BP and records as discussed Patient will continue to make  an effort to check her vitals.  Why is this important?   You will be able to handle your symptoms better if you keep track of them.  Making some simple changes to your lifestyle will help.  Eating healthy is one thing you can do to take good care of yourself.         The patient verbalized understanding of instructions, educational materials, and care plan provided today and declined offer to receive copy of patient instructions, educational materials, and care plan.   Follow up Plan: Patient would like continued follow-up.  CCM RNCM will outreach the patient within the next 5 weeks.  Patient will call office if needed prior to next encounter  [name], RN  [phone]
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
## 2021-01-26 ENCOUNTER — Other Ambulatory Visit: Payer: Self-pay | Admitting: Family Medicine

## 2021-01-26 DIAGNOSIS — K219 Gastro-esophageal reflux disease without esophagitis: Secondary | ICD-10-CM

## 2021-02-15 ENCOUNTER — Other Ambulatory Visit: Payer: Self-pay | Admitting: Family Medicine

## 2021-02-20 ENCOUNTER — Ambulatory Visit: Payer: Medicare Other

## 2021-02-21 NOTE — Patient Instructions (Signed)
Visit Information  Ms. Biggers  it was nice speaking with you. Please call me directly [phone] if you have questions about the goals we discussed.  atient Goals/Self Care Activities: -Patient will self administer medications as prescribed as evidenced by self report/primary caregiver report  -Patient will attend all scheduled provider appointments as evidenced by clinician review of documented attendance to scheduled appointments and patient/caregiver report -Patient will call pharmacy for medication refills as evidenced by patient report and review of pharmacy fill history as appropriate -Patient will call provider office for new concerns or questions as evidenced by review of documented incoming telephone call notes and patient report -Patient verbalizes understanding of plan -Patient will focus on medication adherence by take medication as prescribed -Calls provider office for new concerns, questions, or BP outside discussed parameters -Checks BP and records as discussed -Follows a low sodium diet/DASH diet -check blood sugar at prescribed times -take the blood sugar meter to all doctor visits  -Weigh daily and record (notify MD with 3 lb weight gain over night or 5 lb in a week) -Follow CHF Action Plan   The patient verbalized understanding of instructions, educational materials, and care plan provided today and declined offer to receive copy of patient instructions, educational materials, and care plan.   Follow up Plan: Patient would like continued follow-up.  CCM RNCM will outreach the patient within the next 4 weeks.  Patient will call office if needed prior to next encounter  [name], RN  [phone]
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
## 2021-03-13 ENCOUNTER — Telehealth: Payer: Self-pay | Admitting: *Deleted

## 2021-03-13 NOTE — Chronic Care Management (AMB) (Signed)
  Care Management   Note  03/13/2021 Name: Desiree Parker MRN: [id number] DOB: 12/17/1945  Desiree Parker is a 75 y.o. year old female who is a primary care patient of [name], MD and is actively engaged with the care management team. I reached out to [name] by phone today to assist with re-scheduling a follow up visit with the RN Case Manager  Follow up plan: Unsuccessful telephone outreach attempt made. A HIPAA compliant phone message was left for the patient providing contact information and requesting a return call.  The care management team will reach out to the patient again over the next 7 days.  If patient returns call to provider office, please advise to call [place] at [phone].  [facility] Management  Direct Dial: [phone]

## 2021-03-20 NOTE — Chronic Care Management (AMB) (Signed)
  Care Management   Note  03/20/2021 Name: Desiree Parker MRN: [id number] DOB: 1945/05/12  Desiree Parker is a 75 y.o. year old female who is a primary care patient of [name], MD and is actively engaged with the care management team. I reached out to [name] by phone today to assist with re-scheduling a follow up visit with the RN Case Manager  Follow up plan: Telephone appointment with care management team member scheduled for:03/30/21  [facility] Management  Direct Dial: [phone]

## 2021-03-23 ENCOUNTER — Telehealth: Payer: Medicare Other

## 2021-03-30 ENCOUNTER — Ambulatory Visit: Payer: Medicare Other

## 2021-03-30 NOTE — Patient Instructions (Signed)
Visit Information  Desiree Parker  it was nice speaking with you. Please call me directly [phone] if you have questions about the goals we discussed.   Patient Goals/Self Care Activities: -Patient will self administer medications as prescribed as evidenced by self report/primary caregiver report  -Patient will attend all scheduled provider appointments as evidenced by clinician review of documented attendance to scheduled appointments and patient/caregiver report -Patient will call pharmacy for medication refills as evidenced by patient report and review of pharmacy fill history as appropriate -Patient will call provider office for new concerns or questions as evidenced by review of documented incoming telephone call notes and patient report -Patient verbalizes understanding of plan -Patient will focus on medication adherence by take medication as prescribed -Calls provider office for new concerns, questions, or BP outside discussed parameters -Checks BP and records as discussed -Follows a low sodium diet/DASH diet -check blood sugar at prescribed times -take the blood sugar meter to all doctor visits  -Weigh daily and record (notify MD with 3 lb weight gain over night or 5 lb in a week) -Follow CHF Action Plan  The patient verbalized understanding of instructions, educational materials, and care plan provided today and declined offer to receive copy of patient instructions, educational materials, and care plan.   Follow up Plan: Patient would like continued follow-up.  CCM RNCM will outreach the patient within the next 4 weeks.  Patient will call office if needed prior to next encounter  [name], RN  [phone]
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
## 2021-04-17 ENCOUNTER — Ambulatory Visit: Payer: Medicare Other

## 2021-04-17 NOTE — Patient Instructions (Signed)
Visit Information  Desiree Parker  it was nice speaking with you. Please call me directly [phone] if you have questions about the goals we discussed.  Patient Goals/Self Care Activities: -Patient will self administer medications as prescribed as evidenced by self report/primary caregiver report  -Patient will attend all scheduled provider appointments as evidenced by clinician review of documented attendance to scheduled appointments and patient/caregiver report -Patient will call pharmacy for medication refills as evidenced by patient report and review of pharmacy fill history as appropriate -Patient will call provider office for new concerns or questions as evidenced by review of documented incoming telephone call notes and patient report -Patient verbalizes understanding of plan -Patient will focus on medication adherence by take medication as prescribed -Calls provider office for new concerns, questions, or BP outside discussed parameters -Checks BP and records as discussed -Follows a low sodium diet/DASH diet -check blood sugar at prescribed times -take the blood sugar meter to all doctor visits  -Weigh daily and record (notify MD with 3 lb weight gain over night or 5 lb in a week) -Follow CHF Action Plan -Remember to talk with son to teach you how to check your blood pressure  The patient verbalized understanding of instructions, educational materials, and care plan provided today and declined offer to receive copy of patient instructions, educational materials, and care plan.   Follow up Plan: Patient would like continued follow-up.  CCM RNCM will outreach the patient within the next 4 weeks.  Patient will call office if needed prior to next encounter  [name], RN  [phone]
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
## 2021-04-26 ENCOUNTER — Other Ambulatory Visit: Payer: Self-pay | Admitting: Family Medicine

## 2021-04-26 DIAGNOSIS — Z9114 Patient's other noncompliance with medication regimen: Secondary | ICD-10-CM

## 2021-05-15 ENCOUNTER — Ambulatory Visit: Payer: Medicare Other

## 2021-05-15 NOTE — Chronic Care Management (AMB) (Signed)
Chronic Care Management   CCM RN Visit Note  05/15/2021 Name: Desiree Parker MRN: [id number] DOB: [birth year]  Subjective: Desiree Parker is a 76 y.o. year old female who is a primary care patient of Desiree Feil, MD. The care management team was consulted for assistance with disease management and care coordination needs.    Engaged with patient by telephone for follow up visit in response to provider referral for case management and/or care coordination services.   Consent to Services:  The patient was given information about Chronic Care Management services, agreed to services, and gave verbal consent prior to initiation of services.  Please see initial visit note for detailed documentation.   Patient agreed to services and verbal consent obtained.    Summary: The patient is making significant efforts toward positive changes in her chronic conditions.. See Care Plan below for interventions and patient self-care actives.  Recommendation: The patient may benefit from checking her blood sugar and weight.  Have her son to check her blood pressure machine and show her how to use it again.  Record all the values.  The patient agrees.  Follow up Plan: Patient would like continued follow-up.  CCM RNCM will outreach the patient within the next 7 weeks.  Patient will call office if needed prior to next encounter   Assessment: Review of patient past medical history, allergies, medications, health status, including review of consultants reports, laboratory and other test data, was performed as part of comprehensive evaluation and provision of chronic care management services.   SDOH (Social Determinants of Health) assessments and interventions performed:  No  CCM Care Plan   Conditions to be addressed/monitored:CHF, HTN, and DMII  Care Plan : RN Care Manager  Updates made by Desiree Arms, RN since 05/15/2021 12:00 AM     Problem: Management of CHF DM II HTN   Priority: High  Onset Date:  09/10/2019  Note:    Long-Range Goal: Management of CHF, DM II and HTN   Note:   Chronic Disease Management support, education, and care coordination needs related to CHF, HTN, and DM II  Clinical Goal(s) related to CHF, HTN, and DM II:  Over the next 90 days, patient will:  Work with the care management team to address educational, disease management, and care coordination needs  Begin or continue self health monitoring activities as directed today Measure and record CBG (blood glucose) one times daily, Measure and record blood pressure three times per week, and Measure and record weight daily Call provider office for new or worsened signs and symptoms  Call care management team with questions or concerns  Interventions: 1:1 collaboration with primary care provider regarding development and update of comprehensive plan of care as evidenced by provider attestation and co-signature Inter-disciplinary care team collaboration (see longitudinal plan of care) Evaluation of current treatment plan related to  self management and patient's adherence to plan as established by provider   Diabetes:  (Status: Goal on Track (progressing): YES.) Long Term Goal   Lab Results  Component Value Date   HGBA1C 5.8 (A) 04/01/2020   Provided education to patient about basic DM disease process Reviewed medications with patient and discussed importance of medication adherence Discussed plans with patient for ongoing care management follow up and provided patient with direct contact information for care management team Advised patient, providing education and rationale, to check CBG  and record, calling the office for findings outside established parameters.   Review of patient status, including review  of consultants reports, relevant laboratory and other test results, and medications completed.  Hypertension Interventions:  (Status:  Goal on track:  NO.) Long Term Goal Last practice recorded BP readings:  BP  Readings from Last 3 Encounters:  05/13/20 (!) 144/64  04/16/20 (!) 152/70  04/01/20 (!) 155/75   Most recent eGFR/CrCl: No results found for: EGFR  No components found for: CRCL  Evaluation of current treatment plan related to hypertension self management and patient's adherence to plan as established by provider Provided education to patient re: stroke prevention, s/s of heart attack and stroke Counseled on the importance of exercise goals  Discussed plans with patient for ongoing care management follow up and provided patient with direct contact information for care management team  Heart Failure Interventions:  (Status:  Goal on track:  Yes.) Long Term Goal Basic overview and discussion of pathophysiology of Heart Failure reviewed Provided education on low sodium diet Reviewed Heart Failure Action Plan in depth and provided written copy Discussed importance of daily weight and advised patient to weigh and record daily self-awareness of signs/symptoms of worsening disease encouraged     05/15/21:  I spoke with Desiree Parker, who denied any headache, shortness of breath, flushing, chest pain, or swelling. She is still unable to check her blood pressure. We discussed the reasons why it is essential to check your blood pressure. She verbalized understanding. Her FBS was 117, and her weight was 200 lbs.  Patient Goals/Self Care Activities: -Patient will self administer medications as prescribed as evidenced by self report/primary caregiver report  -Patient will attend all scheduled provider appointments as evidenced by clinician review of documented attendance to scheduled appointments and patient/caregiver report -Patient will call pharmacy for medication refills as evidenced by patient report and review of pharmacy fill history as appropriate -Patient will call provider office for new concerns or questions as evidenced by review of documented incoming telephone call notes and patient  report -Patient verbalizes understanding of plan -Patient will focus on medication adherence by take medication as prescribed -Calls provider office for new concerns, questions, or BP outside discussed parameters -Checks BP and records as discussed -Follows a low sodium diet/DASH diet -check blood sugar at prescribed times record values -take the blood sugar meter to all doctor visits  -Weigh daily and record (notify MD with 3 lb weight gain over night or 5 lb in a week) -Follow CHF Action Plan -Remember to talk with son to teach you how to check your blood pressure          Desiree Arms RN, BSN, [place]  Phone: [phone]

## 2021-05-15 NOTE — Patient Instructions (Signed)
Visit Information  Ms. Lopata  it was nice speaking with you. Please call me directly [phone] if you have questions about the goals we discussed.   Patient Goals/Self Care Activities: -Patient will self administer medications as prescribed as evidenced by self report/primary caregiver report  -Patient will attend all scheduled provider appointments as evidenced by clinician review of documented attendance to scheduled appointments and patient/caregiver report -Patient will call pharmacy for medication refills as evidenced by patient report and review of pharmacy fill history as appropriate -Patient will call provider office for new concerns or questions as evidenced by review of documented incoming telephone call notes and patient report -Patient verbalizes understanding of plan -Patient will focus on medication adherence by take medication as prescribed -Calls provider office for new concerns, questions, or BP outside discussed parameters -Checks BP and records as discussed -Follows a low sodium diet/DASH diet -check blood sugar at prescribed times record values -take the blood sugar meter to all doctor visits  -Weigh daily and record (notify MD with 3 lb weight gain over night or 5 lb in a week) -Follow CHF Action Plan -Remember to talk with son to teach you how to check your blood pressure             The patient verbalized understanding of instructions, educational materials, and care plan provided today and agreed to receive a mailed copy of patient instructions, educational materials, and care plan.   Follow up Plan: Patient would like continued follow-up.  CCM RNCM will outreach the patient within the next 7 weeks.  Patient will call office if needed prior to next encounter  [name], RN  [phone]

## 2021-06-14 ENCOUNTER — Encounter (HOSPITAL_COMMUNITY): Payer: Self-pay | Admitting: Emergency Medicine

## 2021-06-14 ENCOUNTER — Emergency Department (HOSPITAL_COMMUNITY)
Admission: EM | Admit: 2021-06-14 | Discharge: 2021-06-14 | Disposition: A | Discharge status: Home / Self Care | Payer: Medicare Other | Attending: Emergency Medicine | Admitting: Emergency Medicine

## 2021-06-14 ENCOUNTER — Other Ambulatory Visit: Payer: Self-pay

## 2021-06-14 ENCOUNTER — Emergency Department (HOSPITAL_COMMUNITY): Payer: Medicare Other

## 2021-06-14 DIAGNOSIS — Z743 Need for continuous supervision: Secondary | ICD-10-CM | POA: Diagnosis not present

## 2021-06-14 DIAGNOSIS — R0789 Other chest pain: Secondary | ICD-10-CM | POA: Diagnosis not present

## 2021-06-14 DIAGNOSIS — R6889 Other general symptoms and signs: Secondary | ICD-10-CM | POA: Diagnosis not present

## 2021-06-14 DIAGNOSIS — Z79899 Other long term (current) drug therapy: Secondary | ICD-10-CM | POA: Insufficient documentation

## 2021-06-14 DIAGNOSIS — Y92410 Unspecified street and highway as the place of occurrence of the external cause: Secondary | ICD-10-CM | POA: Diagnosis not present

## 2021-06-14 DIAGNOSIS — R0781 Pleurodynia: Secondary | ICD-10-CM | POA: Diagnosis not present

## 2021-06-14 DIAGNOSIS — S60511A Abrasion of right hand, initial encounter: Secondary | ICD-10-CM | POA: Insufficient documentation

## 2021-06-14 DIAGNOSIS — M79641 Pain in right hand: Secondary | ICD-10-CM | POA: Diagnosis not present

## 2021-06-14 DIAGNOSIS — S6991XA Unspecified injury of right wrist, hand and finger(s), initial encounter: Secondary | ICD-10-CM | POA: Diagnosis present

## 2021-06-14 MED ORDER — METHOCARBAMOL 500 MG PO TABS: 500.0000 mg | ORAL_TABLET | Freq: Two times a day (BID) | ORAL | 0 refills | Status: DC

## 2021-06-14 NOTE — ED Notes (Signed)
I provided reinforced discharge education based off of discharge instructions. Pt acknowledged and understood my education. Pt had no further questions/concerns for provider/myself.  °
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
## 2021-06-15 ENCOUNTER — Other Ambulatory Visit: Payer: Self-pay | Admitting: Family Medicine

## 2021-06-15 DIAGNOSIS — Z1231 Encounter for screening mammogram for malignant neoplasm of breast: Secondary | ICD-10-CM

## 2021-06-19 ENCOUNTER — Ambulatory Visit: Payer: Medicare Other

## 2021-06-19 NOTE — Chronic Care Management (AMB) (Signed)
? ?  RN Case Manager ?Care Management  ? Phone Outreach  ? ? ?06/19/2021 ?Name: AILANA CUADRADO MRN: [id number] DOB: [birth year] ? ?JERIANNE ANSELMO is a 76 y.o. year old female who is a primary care patient of [name], MD .  ? ?Unable to keep phone appointment today and requested to rescheduled to later this afternoon. C ? ?Follow Up Plan: CM RN will return call after 2 pm  today as patient requested.  CM reschedule ? ?Review of patient status, including review of consultants reports, relevant laboratory and other test results, and collaboration with appropriate care team members and the patient's provider was performed as part of comprehensive patient evaluation and provision of care management services.   ? ?Addendum: CM RN called the patient back as she requested but she was still unable to talk. The appointment was rescheduled for 06/22/21 at 11 am. ? ? ?[name] RN, BSN, [place] ?Care Management Coordinator ?[facility] ?Phone: [phone] Fax: [phone] ?  ? ? ? ? ? ? ? ? ? ? ? ?

## 2021-06-22 ENCOUNTER — Ambulatory Visit: Payer: Medicare Other
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
## 2021-06-23 NOTE — Patient Instructions (Signed)
Visit Information ? ?Ms. Divirgilio  it was nice speaking with you. Please call me directly [phone] if you have questions about the goals we discussed. ?  ?Patient Goals/Self Care Activities: ?-Patient will self administer medications as prescribed as evidenced by self report/primary caregiver report  ?-Patient will attend all scheduled provider appointments as evidenced by clinician review of documented attendance to scheduled appointments and patient/caregiver report ?-Patient will call pharmacy for medication refills as evidenced by patient report and review of pharmacy fill history as appropriate ?-Patient will call provider office for new concerns or questions as evidenced by review of documented incoming telephone call notes and patient report ?-Patient verbalizes understanding of plan ?-Patient will focus on medication adherence by take medication as prescribed ?-Calls provider office for new concerns, questions, or BP outside discussed parameters ?-Checks BP and records as discussed ?-Follows a low sodium diet/DASH diet ?-check blood sugar at prescribed times record values ?-take the blood sugar meter to all doctor visits  ?-Weigh daily and record (notify MD with 3 lb weight gain over night or 5 lb in a week) ?-Follow CHF Action Plan ?-Remember to talk with son to teach you how to check your blood pressure ?   ?  ?  ?The patient verbalized understanding of instructions, educational materials, and care plan provided today and agreed to receive a mailed copy of patient instructions, educational materials, and care plan.  ? ?Follow up Plan: Patient would like continued follow-up.  CCM RNCM will outreach the patient within the next 5 weeks.  Patient will call office if needed prior to next encounter ? ?[name], RN ? ?[phone]  ?

## 2021-06-30 ENCOUNTER — Ambulatory Visit (INDEPENDENT_AMBULATORY_CARE_PROVIDER_SITE_OTHER): Payer: Medicare Other | Admitting: Family Medicine

## 2021-06-30 ENCOUNTER — Encounter: Payer: Self-pay | Admitting: Family Medicine

## 2021-06-30 ENCOUNTER — Other Ambulatory Visit: Payer: Self-pay

## 2021-06-30 VITALS — BP 199/91 | HR 77 | Ht 60.0 in | Wt 207.6 lb

## 2021-06-30 DIAGNOSIS — I5041 Acute combined systolic (congestive) and diastolic (congestive) heart failure: Secondary | ICD-10-CM

## 2021-06-30 DIAGNOSIS — R053 Chronic cough: Secondary | ICD-10-CM | POA: Diagnosis not present

## 2021-06-30 DIAGNOSIS — J449 Chronic obstructive pulmonary disease, unspecified: Secondary | ICD-10-CM | POA: Diagnosis not present

## 2021-06-30 DIAGNOSIS — R0602 Shortness of breath: Secondary | ICD-10-CM

## 2021-06-30 DIAGNOSIS — I10 Essential (primary) hypertension: Secondary | ICD-10-CM

## 2021-06-30 DIAGNOSIS — E1165 Type 2 diabetes mellitus with hyperglycemia: Secondary | ICD-10-CM

## 2021-06-30 DIAGNOSIS — M25561 Pain in right knee: Secondary | ICD-10-CM | POA: Diagnosis not present

## 2021-06-30 DIAGNOSIS — I5032 Chronic diastolic (congestive) heart failure: Secondary | ICD-10-CM | POA: Diagnosis not present

## 2021-06-30 DIAGNOSIS — I700 Atherosclerosis of aorta: Secondary | ICD-10-CM | POA: Diagnosis not present

## 2021-06-30 DIAGNOSIS — Z87891 Personal history of nicotine dependence: Secondary | ICD-10-CM

## 2021-06-30 LAB — POCT GLYCOSYLATED HEMOGLOBIN (HGB A1C): HbA1c, POC (controlled diabetic range): 6.2 % (ref 0.0–7.0)

## 2021-06-30 MED ORDER — LISINOPRIL 20 MG PO TABS: 20.0000 mg | ORAL_TABLET | Freq: Every day | ORAL | 1 refills | Status: DC

## 2021-06-30 MED ORDER — AMLODIPINE BESYLATE 5 MG PO TABS: 5.0000 mg | ORAL_TABLET | Freq: Every day | ORAL | 3 refills | Status: DC

## 2021-06-30 NOTE — Assessment & Plan Note (Signed)
Diet and exercise counseling provided. ?Discussed nutritionist referral in the future. ?She and her son agreed. ?In the meantime, she will work on calorie counting.  ?

## 2021-06-30 NOTE — Assessment & Plan Note (Addendum)
Recent PFT normal. ?Given persistent symptoms with intermittent cough with hx of tobacco abuse, CT lungs ordered. ?I will contact her with the result.  ?Continue Breo and Spiriva. ?Consider Pulm referral.  ?

## 2021-06-30 NOTE — Assessment & Plan Note (Signed)
Found on CT scan. ?Continue Statin. ?Consider ASA - will wait on the repeat CT scan result and discuss resumption of ASA. ?She and her son agreed with the plan.  ?

## 2021-06-30 NOTE — Assessment & Plan Note (Signed)
Off meds for > 1 week. ?However, BP remains elevated while on meds. ?I refilled her Lisinopril 20 mg QD and added Norvasc 5 mg on. ?F/U in 2 weeks for BP check. ?Sooner if symptomatic. ?She agreed with the plan.  ?

## 2021-06-30 NOTE — Assessment & Plan Note (Signed)
Persistent SOB. ?Last ECHO in 2021 shows G1DD ?Repeat ECHO scheduled. ?Consider Cards referral in the near future. ?

## 2021-06-30 NOTE — Assessment & Plan Note (Signed)
A1C increased to 6 but still at goal. ?No change in her DM regimen. ?DM shoes ordered. ?
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
## 2021-06-30 NOTE — Patient Instructions (Signed)
Shortness of Breath, Adult °Shortness of breath means you have trouble breathing. Shortness of breath could be a sign of a medical problem. °Follow these instructions at home: °Pollution °Do not smoke or use any products that contain nicotine or tobacco. If you need help quitting, ask your doctor. °Avoid things that can make it harder to breathe, such as: °Smoke of all kinds. This includes smoke from campfires or forest fires. Do not smoke or allow others to smoke in your home. °Mold. °Dust. °Air pollution. °Chemical smells. °Things that can give you an allergic reaction (allergens) if you have allergies. °Keep your living space clean. Use products that help remove mold and dust. °General instructions °Watch for any changes in your symptoms. °Take over-the-counter and prescription medicines only as told by your doctor. This includes oxygen therapy and inhaled medicines. °Rest as needed. °Return to your normal activities when your doctor says that it is safe. °Keep all follow-up visits. °Contact a doctor if: °Your condition does not get better as soon as expected. °You have a hard time doing your normal activities, even after you rest. °You have new symptoms. °You cannot walk up stairs. °You cannot exercise the way you normally do. °Get help right away if: °Your shortness of breath gets worse. °You have trouble breathing when you are resting. °You feel light-headed or you faint. °You have a cough that is not helped by medicines. °You cough up blood. °You have pain with breathing. °You have pain in your chest, arms, shoulders, or belly (abdomen). °You have a fever. °These symptoms may be an emergency. Get help right away. Call 911. °Do not wait to see if the symptoms will go away. °Do not drive yourself to the hospital. °Summary °Shortness of breath is when you have trouble breathing enough air. It can be a sign of a medical problem. °Avoid things that make it hard for you to breathe, such as smoking, pollution, mold,  and dust. °Watch for any changes in your symptoms. Contact your doctor if you do not get better or you get worse. °This information is not intended to replace advice given to you by your health care provider. Make sure you discuss any questions you have with your health care provider. °Document Revised: 11/15/2020 Document Reviewed: 11/15/2020 °Elsevier Patient Education © 2022 Elsevier Inc. ° °

## 2021-07-01 ENCOUNTER — Telehealth: Payer: Self-pay | Admitting: Family Medicine

## 2021-07-01 LAB — LIPID PANEL
Chol/HDL Ratio: 1.9 ratio (ref 0.0–4.4)
Cholesterol, Total: 126 mg/dL (ref 100–199)
HDL: 65 mg/dL (ref >39)
LDL Chol Calc (NIH): 44 mg/dL (ref 0–99)
Triglycerides: 89 mg/dL (ref 0–149)
VLDL Cholesterol Cal: 17 mg/dL (ref 5–40)

## 2021-07-01 LAB — CMP14+EGFR
ALT: 28 IU/L (ref 0–32)
AST: 34 IU/L (ref 0–40)
Albumin/Globulin Ratio: 1.5 (ref 1.2–2.2)
Albumin: 4.3 g/dL (ref 3.7–4.7)
Alkaline Phosphatase: 82 IU/L (ref 44–121)
BUN/Creatinine Ratio: 13 (ref 12–28)
BUN: 11 mg/dL (ref 8–27)
Bilirubin Total: 0.4 mg/dL (ref 0.0–1.2)
CO2: 26 mmol/L (ref 20–29)
Calcium: 9.7 mg/dL (ref 8.7–10.3)
Chloride: 101 mmol/L (ref 96–106)
Creatinine, Ser: 0.85 mg/dL (ref 0.57–1.00)
Globulin, Total: 2.9 g/dL (ref 1.5–4.5)
Glucose: 96 mg/dL (ref 70–99)
Potassium: 4.2 mmol/L (ref 3.5–5.2)
Sodium: 141 mmol/L (ref 134–144)
Total Protein: 7.2 g/dL (ref 6.0–8.5)
eGFR: 72 mL/min/{1.73_m2} (ref >59)

## 2021-07-01 NOTE — Telephone Encounter (Signed)
HIPAA compliant callback message left on her cellphone line. ? ?However, I was able to reach her on her home line. ? ?All lab test results were normal. She verbalized understanding. I will contact her with ECHO and CT scan once completed. ?

## 2021-07-02 ENCOUNTER — Other Ambulatory Visit: Payer: Self-pay

## 2021-07-02 ENCOUNTER — Ambulatory Visit (HOSPITAL_COMMUNITY)
Admission: RE | Admit: 2021-07-02 | Discharge: 2021-07-02 | Disposition: A | Discharge status: Home / Self Care | Payer: Medicare Other | Source: Ambulatory Visit | Attending: Family Medicine | Admitting: Family Medicine

## 2021-07-02 DIAGNOSIS — I5032 Chronic diastolic (congestive) heart failure: Secondary | ICD-10-CM

## 2021-07-02 DIAGNOSIS — E119 Type 2 diabetes mellitus without complications: Secondary | ICD-10-CM | POA: Insufficient documentation

## 2021-07-02 DIAGNOSIS — J449 Chronic obstructive pulmonary disease, unspecified: Secondary | ICD-10-CM | POA: Insufficient documentation

## 2021-07-02 DIAGNOSIS — E785 Hyperlipidemia, unspecified: Secondary | ICD-10-CM | POA: Diagnosis not present

## 2021-07-02 DIAGNOSIS — I110 Hypertensive heart disease with heart failure: Secondary | ICD-10-CM | POA: Diagnosis not present

## 2021-07-02 DIAGNOSIS — R0602 Shortness of breath: Secondary | ICD-10-CM

## 2021-07-03 ENCOUNTER — Telehealth: Payer: Self-pay | Admitting: Family Medicine

## 2021-07-03 LAB — ECHOCARDIOGRAM COMPLETE
Area-P 1/2: 2.37 cm2
Calc EF: 57.4 %
S' Lateral: 2.4 cm
Single Plane A2C EF: 61.6 %
Single Plane A4C EF: 59.4 %

## 2021-07-03 NOTE — Telephone Encounter (Signed)
I called and discussed her result with the patient. She wanted me to also call her son with the result. However, he did not pick up, and I left a message for him to call back. ? ?Please advise him when he calls that her ECHO: Normal ?Awaiting CT lungs result. ?

## 2021-07-03 NOTE — Telephone Encounter (Signed)
Son returns call to nurse line.  ? ?Son advised of normal ECHO and still waiting on results for CT lung.  ? ? ?

## 2021-07-09 ENCOUNTER — Ambulatory Visit (HOSPITAL_COMMUNITY)
Admission: RE | Admit: 2021-07-09 | Discharge: 2021-07-09 | Disposition: A | Discharge status: Home / Self Care | Payer: Medicare Other | Source: Ambulatory Visit | Attending: Family Medicine | Admitting: Family Medicine

## 2021-07-09 DIAGNOSIS — R053 Chronic cough: Secondary | ICD-10-CM | POA: Insufficient documentation

## 2021-07-09 DIAGNOSIS — R0600 Dyspnea, unspecified: Secondary | ICD-10-CM | POA: Diagnosis not present

## 2021-07-09 DIAGNOSIS — R0602 Shortness of breath: Secondary | ICD-10-CM | POA: Insufficient documentation

## 2021-07-10 ENCOUNTER — Telehealth: Payer: Self-pay | Admitting: Family Medicine

## 2021-07-10 MED ORDER — ASPIRIN EC 81 MG PO TBEC: 81.0000 mg | DELAYED_RELEASE_TABLET | Freq: Every day | ORAL | 11 refills | Status: AC

## 2021-07-10 NOTE — Telephone Encounter (Signed)
CT chest result discussed. No surgical intervention needed for her hernia. ?+ Atherosclerosis - continue Statin and start ASA. ?Med escribed. Son was appreciative of the call.  ? ?CT Chest Wo Contrast ? ?Result Date: 07/10/2021 ?CLINICAL DATA:  Chronic dyspnea. EXAM: CT CHEST WITHOUT CONTRAST TECHNIQUE: Multidetector CT imaging of the chest was performed following the standard protocol without IV contrast. RADIATION DOSE REDUCTION: This exam was performed according to the departmental dose-optimization program which includes automated exposure control, adjustment of the mA and/or kV according to patient size and/or use of iterative reconstruction technique. COMPARISON:  June 06, 2019. FINDINGS: Cardiovascular: Atherosclerosis of thoracic aorta is noted without aneurysm formation. Normal cardiac size. No pericardial effusion. Mediastinum/Nodes: Small sliding-type hiatal hernia is noted. No adenopathy is noted. Thyroid gland is unremarkable. Lungs/Pleura: Lungs are clear. No pleural effusion or pneumothorax. Upper Abdomen: No acute abnormality. Musculoskeletal: No chest wall mass or suspicious bone lesions identified. IMPRESSION: Small sliding-type hiatal hernia. No other significant abnormality seen in the chest. Aortic Atherosclerosis (ICD10-I70.0). Electronically Signed   By: Marijo Conception M.D.   On: 07/10/2021 13:30    ?

## 2021-07-14 ENCOUNTER — Encounter: Payer: Self-pay | Admitting: Family Medicine

## 2021-07-14 ENCOUNTER — Ambulatory Visit (INDEPENDENT_AMBULATORY_CARE_PROVIDER_SITE_OTHER): Payer: Medicare Other | Admitting: Family Medicine

## 2021-07-14 DIAGNOSIS — E1165 Type 2 diabetes mellitus with hyperglycemia: Secondary | ICD-10-CM | POA: Diagnosis not present

## 2021-07-14 DIAGNOSIS — I10 Essential (primary) hypertension: Secondary | ICD-10-CM

## 2021-07-14 DIAGNOSIS — R0600 Dyspnea, unspecified: Secondary | ICD-10-CM | POA: Diagnosis not present

## 2021-07-14 DIAGNOSIS — J449 Chronic obstructive pulmonary disease, unspecified: Secondary | ICD-10-CM | POA: Diagnosis not present

## 2021-07-14 DIAGNOSIS — J4489 Other specified chronic obstructive pulmonary disease: Secondary | ICD-10-CM

## 2021-07-14 MED ORDER — BREO ELLIPTA 100-25 MCG/ACT IN AEPB: 1.0000 | INHALATION_SPRAY | Freq: Every day | RESPIRATORY_TRACT | 1 refills | Status: DC

## 2021-07-14 MED ORDER — SPIRIVA HANDIHALER 18 MCG IN CAPS: ORAL_CAPSULE | RESPIRATORY_TRACT | 2 refills | Status: DC

## 2021-07-14 MED ORDER — LISINOPRIL 20 MG PO TABS: 20.0000 mg | ORAL_TABLET | Freq: Every day | ORAL | 1 refills | Status: DC

## 2021-07-14 MED ORDER — ALBUTEROL SULFATE HFA 108 (90 BASE) MCG/ACT IN AERS: 2.0000 | INHALATION_SPRAY | Freq: Four times a day (QID) | RESPIRATORY_TRACT | 6 refills | Status: DC | PRN

## 2021-07-14 MED ORDER — LISINOPRIL 20 MG PO TABS
20.0000 mg | ORAL_TABLET | Freq: Every day | ORAL | 1 refills | Status: DC
Start: 2021-07-14 — End: 2022-04-16

## 2021-07-14 NOTE — Assessment & Plan Note (Signed)
Persistent SOB despite neg ECHO, normal CT chest and compliant with her Breo ellipta and Spiriva. ?Plan to refer to pulmonologist for further evaluation. ?Consider referral to Cardiology if pulm exam is non-revealing.  ?She and her son agreed with the plan.  ?

## 2021-07-14 NOTE — Assessment & Plan Note (Signed)
F/U with ophthalmology for eye exam as planned. ?Continue current Metformin regimen.  ?

## 2021-07-14 NOTE — Patient Instructions (Signed)
How to Take Your Blood Pressure ?Blood pressure measures how strongly your blood is pressing against the walls of your arteries. Arteries are blood vessels that carry blood from your heart throughout your body. You can take your blood pressure at home with a machine. ?You may need to check your blood pressure at home: ?To check if you have high blood pressure (hypertension). ?To check your blood pressure over time. ?To make sure your blood pressure medicine is working. ?Supplies needed: ?Blood pressure machine, or monitor. ?Dining room chair to sit in. ?Table or desk. ?Small notebook. ?Pencil or pen. ?How to prepare ?Avoid these things for 30 minutes before checking your blood pressure: ?Having drinks with caffeine in them, such as coffee or tea. ?Drinking alcohol. ?Eating. ?Smoking. ?Exercising. ?Do these things five minutes before checking your blood pressure: ?Go to the bathroom and pee (urinate). ?Sit in a dining chair. Do not sit on a soft couch or an armchair. ?Be quiet. Do not talk. ?How to take your blood pressure ?Follow the instructions that came with your machine. If you have a digital blood pressure monitor, these may be the instructions: ?Sit up straight. ?Place your feet on the floor. Do not cross your ankles or legs. ?Rest your left arm at the level of your heart. You may rest it on a table, desk, or chair. ?Pull up your shirt sleeve. ?Wrap the blood pressure cuff around the upper part of your left arm. The cuff should be 1 inch (2.5 cm) above your elbow. It is best to wrap the cuff around bare skin. ?Fit the cuff snugly around your arm. You should be able to place only one finger between the cuff and your arm. ?Place the cord so that it rests in the bend of your elbow. ?Press the power button. ?Sit quietly while the cuff fills with air and loses air. ?Write down the numbers on the screen. ?Wait 2-3 minutes and then repeat steps 1-10. ?What do the numbers mean? ?Two numbers make up your blood  pressure. The first number is called systolic pressure. The second is called diastolic pressure. An example of a blood pressure reading is "120 over 80" (or 120/80). ?If you are an adult and do not have a medical condition, use this guide to find out if your blood pressure is normal: ?Normal ?First number: below 120. ?Second number: below 80. ?Elevated ?First number: 120-129. ?Second number: below 80. ?Hypertension stage 1 ?First number: 130-139. ?Second number: 80-89. ?Hypertension stage 2 ?First number: 140 or above. ?Second number: 90 or above. ?Your blood pressure is above normal even if only the first or only the second number is above normal. ?Follow these instructions at home: ?Medicines ?Take over-the-counter and prescription medicines only as told by your doctor. ?Tell your doctor if your medicine is causing side effects. ?General instructions ?Check your blood pressure as often as your doctor tells you to. ?Check your blood pressure at the same time every day. ?Take your monitor to your next doctor's appointment. Your doctor will: ?Make sure you are using it correctly. ?Make sure it is working right. ?Understand what your blood pressure numbers should be. ?Keep all follow-up visits as told by your doctor. This is important. ?General tips ?You will need a blood pressure machine, or monitor. Your doctor can suggest a monitor. You can buy one at a drugstore or online. When choosing one: ?Choose one with an arm cuff. ?Choose one that wraps around your upper arm. Only one finger should fit between   your arm and the cuff. ?Do not choose one that measures your blood pressure from your wrist or finger. ?Where to find more information ?American Heart Association: www.heart.org ?Contact a doctor if: ?Your blood pressure keeps being high. ?Your blood pressure is suddenly low. ?Get help right away if: ?Your first blood pressure number is higher than 180. ?Your second blood pressure number is higher than  120. ?Summary ?Check your blood pressure at the same time every day. ?Avoid caffeine, alcohol, smoking, and exercise for 30 minutes before checking your blood pressure. ?Make sure you understand what your blood pressure numbers should be. ?This information is not intended to replace advice given to you by your health care provider. Make sure you discuss any questions you have with your health care provider. ?Document Revised: 02/06/2020 Document Reviewed: 03/23/2019 ?Elsevier Patient Education ? 2022 Elsevier Inc. ? ?

## 2021-07-14 NOTE — Assessment & Plan Note (Addendum)
BP improved but still slightly elevated. ?His son affirms that she takes Lisinopril 20 mg daily and Norvasc 5 QD. ?As discussed with her and her son, I will not make any medication adjustment today. ?Home BP monitoring discussed. ?F/U in 4 weeks for reassessment.  ?

## 2021-07-14 NOTE — Progress Notes (Signed)
? ? ?  SUBJECTIVE:  ? ?CHIEF COMPLAINT / HPI:  ? ?HTN: ?Here for f/u. She takes Lisinopril 20 mg QD and Norvasc 5 mg QD. She does not check her BP regularly at home. She denies any new concerns. ? ?DM2: She is compliant with her Metformin. She has an eye examination appointment with Dr. Gershon Crane on Sept 14 ? ?Dyspnea on exertion/COPD: ? SOB with walking a few block. She denies chest pain. She is compliant with her inhalers but need refills.  ? ?PERTINENT  PMH / PSH: PMHx reviewed ? ?OBJECTIVE:  ? ?Vitals:  ? 07/14/21 0942 07/14/21 0959  ?BP: 133/61 (!) 149/74  ?Pulse: 78   ?SpO2: 99%   ?Weight: 203 lb (92.1 kg)   ?Height: 5' (1.524 m)   ? ? ?Physical Exam ?Vitals reviewed.  ?Cardiovascular:  ?   Rate and Rhythm: Normal rate and regular rhythm.  ?   Heart sounds: Normal heart sounds. No murmur heard. ?Pulmonary:  ?   Effort: Pulmonary effort is normal. No respiratory distress.  ?   Breath sounds: Normal breath sounds. No wheezing.  ?Neurological:  ?   Mental Status: She is oriented to person, place, and time.  ? ? ? ?ASSESSMENT/PLAN:  ? ?HYPERTENSION, BENIGN SYSTEMIC ?BP improved but still slightly elevated. ?His son affirms that she takes Lisinopril 20 mg daily and Norvasc 5 QD. ?As discussed with her and her son, I will not make any medication adjustment today. ?Home BP monitoring discussed. ?F/U in 4 weeks for reassessment.  ? ?Diabetes mellitus, type II ([place]) ?F/U with ophthalmology for eye exam as planned. ?Continue current Metformin regimen.  ? ?COPD (chronic obstructive pulmonary disease) ([place]) ?Persistent SOB despite neg ECHO, normal CT chest and compliant with her Breo ellipta and Spiriva. ?Plan to refer to pulmonologist for further evaluation. ?Consider referral to Cardiology if pulm exam is non-revealing.  ?She and her son agreed with the plan.  ?  ? ?At the hallway on her way out she mentioned she has been having leg swelling in the last few days, although during visit, when asked about leg swelling she  denies. ?Compression stocking recommended for intermittent leg swelling. ?I will reassess at next visit.  ? ?Andrena Mews, MD ?[place]  ? ?

## 2021-07-15 DIAGNOSIS — Z0000 Encounter for general adult medical examination without abnormal findings: Secondary | ICD-10-CM | POA: Diagnosis not present

## 2021-07-15 DIAGNOSIS — I10 Essential (primary) hypertension: Secondary | ICD-10-CM | POA: Diagnosis not present

## 2021-07-15 DIAGNOSIS — M159 Polyosteoarthritis, unspecified: Secondary | ICD-10-CM | POA: Diagnosis not present

## 2021-07-15 DIAGNOSIS — K219 Gastro-esophageal reflux disease without esophagitis: Secondary | ICD-10-CM | POA: Diagnosis not present

## 2021-07-15 DIAGNOSIS — R2681 Unsteadiness on feet: Secondary | ICD-10-CM | POA: Diagnosis not present

## 2021-07-15 DIAGNOSIS — J449 Chronic obstructive pulmonary disease, unspecified: Secondary | ICD-10-CM | POA: Diagnosis not present

## 2021-07-15 DIAGNOSIS — E119 Type 2 diabetes mellitus without complications: Secondary | ICD-10-CM | POA: Diagnosis not present

## 2021-07-23 ENCOUNTER — Ambulatory Visit: Payer: Medicare Other

## 2021-07-24 ENCOUNTER — Ambulatory Visit: Payer: Medicare Other

## 2021-07-24 NOTE — Patient Instructions (Signed)
Visit Information ? ?Ms. Paddack  it was nice speaking with you. Please call me directly [phone] if you have questions about the goals we discussed. ? ?  ?  ?Patient Goals/Self Care Activities: ?-Patient will self administer medications as prescribed as evidenced by self report/primary caregiver report  ?-Patient will attend all scheduled provider appointments as evidenced by clinician review of documented attendance to scheduled appointments and patient/caregiver report ?-Patient will call pharmacy for medication refills as evidenced by patient report and review of pharmacy fill history as appropriate ?-Patient will call provider office for new concerns or questions as evidenced by review of documented incoming telephone call notes and patient report ?-Patient verbalizes understanding of plan ?-Patient will focus on medication adherence by take medication as prescribed ?-Calls provider office for new concerns, questions, or BP outside discussed parameters ?-Checks BP and records as discussed ?-Follows a low sodium diet/DASH diet ?-check blood sugar at prescribed times record values ?-take the blood sugar meter to all doctor visits  ?-Weigh daily and record (notify MD with 3 lb weight gain over night or 5 lb in a week) ?-Follow CHF Action Plan ? ? ?The patient verbalized understanding of instructions, educational materials, and care plan provided today and agreed to receive a mailed copy of patient instructions, educational materials, and care plan.  ? ?Follow up Plan: Patient would like continued follow-up.  CCM RNCM will outreach the patient within the next 5 weeks.  Patient will call office if needed prior to next encounter ? ?[name], RN ? ?[phone]  ?

## 2021-07-24 NOTE — Chronic Care Management (AMB) (Signed)
?Chronic Care Management  ? ?CCM RN Visit Note ? ?07/24/2021 ?Name: Desiree Parker MRN: [id number] DOB: [birth year] ? ?Subjective: ?Desiree Parker is a 76 y.o. year old female who is a primary care patient of En[place]ola, Phill Myron, MD. The care management team was consulted for assistance with disease management and care coordination needs.   ? ?Engaged with patient by telephone for follow up visit in response to provider referral for case management and/or care coordination services.  ? ?Consent to Services:  ?The patient was given information about Chronic Care Management services, agreed to services, and gave verbal consent prior to initiation of services.  Please see initial visit note for detailed documentation.  ? ?Patient agreed to services and verbal consent obtained.  ? ? ?Summary:  The patient is making progress with her diabetes an CHFbut continues to experience difficulty with checking her blood pressure... See Care Plan below for interventions and patient self-care actives. ? ?Recommendation: The patient may benefit from taking medications as prescribed, exercising as tolerated, monitoring food intake, watch fluid intake, weighing Daily and record values, checking Blood sugar and record values, checking blood pressures and record values, calling your physician if numbers are abnormal, and The patient agrees. ? ?Follow up Plan: Patient would like continued follow-up.  CCM RNCM will outreach the patient within the next 5 weeks.  Patient will call office if needed prior to next encounter ? ? ?Assessment: Review of patient past medical history, allergies, medications, health status, including review of consultants reports, laboratory and other test data, was performed as part of comprehensive evaluation and provision of chronic care management services.  ? ?SDOH (Social Determinants of Health) assessments and interventions performed:  No ? ?CCM Care Plan ? ?Conditions to be addressed/monitored:CHF, HTN, and  DMII ? ?Care Plan : RN Care Manager  ?Updates made by [name], RN since 07/24/2021 12:00 AM  ?  ? ?Problem: Management of CHF DM II HTN   ?Priority: High  ?Onset Date: 09/10/2019  ?Note:   ?Desiree Parker any chest paain shortness of breath or swselling, .   Long-Range Goal: Management of CHF, DM II and HTN   ?Note:   ?Chronic Disease Management support, education, and care coordination needs related to CHF, HTN, and DM II ? ?Clinical Goal(s) related to CHF, HTN, and DM II:  ?Over the next 90 days, patient will:  ?Work with the care management team to address educational, disease management, and care coordination needs  ?Begin or continue self health monitoring activities as directed today Measure and record CBG (blood glucose) one times daily, Measure and record blood pressure three times per week, and Measure and record weight daily ?Call provider office for new or worsened signs and symptoms  ?Call care management team with questions or concerns ? ?Interventions: ?1:1 collaboration with primary care provider regarding development and update of comprehensive plan of care as evidenced by provider attestation and co-signature ?Inter-disciplinary care team collaboration (see longitudinal plan of care) ?Evaluation of current treatment plan related to  self management and patient's adherence to plan as established by provider ? ? ?Diabetes:  (Status: Goal on Track (progressing): YES.) Long Term Goal  ? ?Lab Results  ?Component Value Date  ? HGBA1C 6.2 06/30/2021  ? ?Provided education to patient about basic DM disease process ?Discussed plans with patient for ongoing care management follow up and provided patient with direct contact information for care management team ?Advised patient, providing education and rationale, to check CBG  and record,  calling the office for findings outside established parameters.   ?Review of patient status, including review of consultants reports, relevant laboratory and other test  results, and medications completed. ? ?Hypertension Interventions:  (Status:  Goal on track:  NO.) Long Term Goal ?Last practice recorded BP readings:  ?BP Readings from Last 3 Encounters:  ?07/14/21 (!) 149/74  ?06/30/21 (!) 199/91  ?06/14/21 (!) 189/104  ? ?Most recent eGFR/CrCl:  ?Lab Results  ?Component Value Date  ? EGFR 72 06/30/2021  ?  No components found for: CRCL ? ?Evaluation of current treatment plan related to hypertension self management and patient's adherence to plan as established by provider ?Provided education to patient re: stroke prevention, s/s of heart attack and stroke ?Counseled on the importance of exercise goals  ?Discussed plans with patient for ongoing care management follow up and provided patient with direct contact information for care management team ? ?Heart Failure Interventions:  (Status:  Goal on track:  Yes.) Long Term Goal ?Basic overview and discussion of pathophysiology of Heart Failure reviewed ?Provided education on low sodium diet ?Reviewed Heart Failure Action Plan in depth and provided written copy ?Discussed importance of daily weight and advised patient to weigh and record daily ?self-awareness of signs/symptoms of worsening disease encouraged ?    ?07/24/21:  Desiree Parker states that she is doing well. She denies chest pain, shortness of breath, swelling, or headaches. She said she has a new blood pressure machine but has yet to check her blood pressure. She had an appointment on 07/14/21; her blood pressure was 149/74 but went down to 133/61; her weight was 203 lbs, and her A1C was 6.2. Her blood sugar today was 118. I expressed the importance of monitoring her blood pressure, blood sugar, and weight. She verbalized understanding. ? ? ?Patient Goals/Self Care Activities: ?-Patient will self administer medications as prescribed as evidenced by self report/primary caregiver report  ?-Patient will attend all scheduled provider appointments as evidenced by clinician review of  documented attendance to scheduled appointments and patient/caregiver report ?-Patient will call pharmacy for medication refills as evidenced by patient report and review of pharmacy fill history as appropriate ?-Patient will call provider office for new concerns or questions as evidenced by review of documented incoming telephone call notes and patient report ?-Patient verbalizes understanding of plan ?-Patient will focus on medication adherence by take medication as prescribed ?-Calls provider office for new concerns, questions, or BP outside discussed parameters ?-Checks BP and records as discussed ?-Follows a low sodium diet/DASH diet ?-check blood sugar at prescribed times record values ?-take the blood sugar meter to all doctor visits  ?-Weigh daily and record (notify MD with 3 lb weight gain over night or 5 lb in a week) ?-Follow CHF Action Plan ? ?  ? ? ? ?  ? ?[name] RN, BSN, [place] ?Care Management Coordinator ?[place]  ?Phone: [phone]  ?  ? ? ? ? ? ? ? ? ?

## 2021-07-25 ENCOUNTER — Other Ambulatory Visit: Payer: Self-pay | Admitting: Family Medicine

## 2021-07-25 DIAGNOSIS — K219 Gastro-esophageal reflux disease without esophagitis: Secondary | ICD-10-CM

## 2021-08-03 ENCOUNTER — Ambulatory Visit
Admission: RE | Admit: 2021-08-03 | Discharge: 2021-08-03 | Disposition: A | Discharge status: Home / Self Care | Payer: Medicare Other | Source: Ambulatory Visit | Attending: Family Medicine | Admitting: Family Medicine

## 2021-08-03 DIAGNOSIS — Z1231 Encounter for screening mammogram for malignant neoplasm of breast: Secondary | ICD-10-CM

## 2021-08-21 ENCOUNTER — Other Ambulatory Visit: Payer: Self-pay | Admitting: Family Medicine

## 2021-08-21 DIAGNOSIS — M810 Age-related osteoporosis without current pathological fracture: Secondary | ICD-10-CM

## 2021-08-25 ENCOUNTER — Ambulatory Visit: Payer: Medicare Other

## 2021-08-25 NOTE — Patient Instructions (Signed)
Visit Information ? ?Ms. Demo  it was nice speaking with you. Please call me directly [phone] if you have questions about the goals we discussed. ? ?Patient Goals/Self Care Activities: ?-Patient will self administer medications as prescribed as evidenced by self report/primary caregiver report  ?-Patient will attend all scheduled provider appointments as evidenced by clinician review of documented attendance to scheduled appointments and patient/caregiver report ?-Patient will call pharmacy for medication refills as evidenced by patient report and review of pharmacy fill history as appropriate ?-Patient will call provider office for new concerns or questions as evidenced by review of documented incoming telephone call notes and patient report ?-Patient verbalizes understanding of plan ?-Patient will focus on medication adherence by take medication as prescribed ?-Calls provider office for new concerns, questions, or BP outside discussed parameters ?-Checks BP and records as discussed ?-Follows a low sodium diet/DASH diet ?-check blood sugar at prescribed times record values ?-take the blood sugar meter to all doctor visits  ?-Weigh daily and record (notify MD with 3 lb weight gain over night or 5 lb in a week) ?-Follow CHF Action Plan ?  ? ? ?The patient verbalized understanding of instructions, educational materials, and care plan provided today and agreed to receive a mailed copy of patient instructions, educational materials, and care plan.  ? ?Follow up Plan: Patient would like continued follow-up.  CCM RNCM will outreach the patient within the next 5 weeks.  Patient will call office if needed prior to next encounter ? ?[name], RN ? ?[phone]  ?

## 2021-08-25 NOTE — Chronic Care Management (AMB) (Signed)
?Chronic Care Management  ? ?CCM RN Visit Note ? ?08/25/2021 ?Name: Desiree Parker MRN: [id number] DOB: 1946/03/29 ? ?Subjective: ?Desiree Parker is a 76 y.o. year old female who is a primary care patient of Eniola, Phill Myron, MD. The care management team was consulted for assistance with disease management and care coordination needs.   ? ?Engaged with patient by telephone for follow up visit in response to provider referral for case management and/or care coordination services.  ? ?Consent to Services:  ?The patient was given information about Chronic Care Management services, agreed to services, and gave verbal consent prior to initiation of services.  Please see initial visit note for detailed documentation.  ? ?Patient agreed to services and verbal consent obtained.  ? ? ?Summary:  The patient  continues to experience difficulty with checking her vitals.. See Care Plan below for interventions and patient self-care actives. ? ?Recommendation: The patient may benefit from taking medications as prescribed, exercising as tolerated, monitoring food intake, watch fluid intake, weighing Daily and record values, checking Blood sugar and record values, checking blood pressures and record values, calling your physician if numbers are abnormal, and The patient agrees. ? ?Follow up Plan: Patient would like continued follow-up.  CCM RNCM will outreach the patient within the next 5 weeks.  Patient will call office if needed prior to next encounter ? ? ? ?Assessment: Review of patient past medical history, allergies, medications, health status, including review of consultants reports, laboratory and other test data, was performed as part of comprehensive evaluation and provision of chronic care management services.  ? ?SDOH (Social Determinants of Health) assessments and interventions performed:  No ? ?CCM Care Plan ?Conditions to be addressed/monitored:CHF, HTN, and DMII ? ?Care Plan : RN Care Manager  ?Updates made by [name], RN since 08/25/2021 12:00 AM  ?  ? ?Problem: Management of CHF DM II HTN   ?Priority: High  ?Onset Date: 09/10/2019  ?Note:   ?[name] any chest paain shortness of breath or swselling, .   Long-Range Goal: Management of CHF, DM II and HTN   ?Note:   ?Chronic Disease Management support, education, and care coordination needs related to CHF, HTN, and DM II ? ?Clinical Goal(s) related to CHF, HTN, and DM II:  ?Over the next 90 days, patient will:  ?Work with the care management team to address educational, disease management, and care coordination needs  ?Begin or continue self health monitoring activities as directed today Measure and record CBG (blood glucose) one times daily, Measure and record blood pressure three times per week, and Measure and record weight daily ?Call provider office for new or worsened signs and symptoms  ?Call care management team with questions or concerns ? ?Interventions: ?1:1 collaboration with primary care provider regarding development and update of comprehensive plan of care as evidenced by provider attestation and co-signature ?Inter-disciplinary care team collaboration (see longitudinal plan of care) ?Evaluation of current treatment plan related to  self management and patient's adherence to plan as established by provider ? ? ?Diabetes:  (Status: Goal on Track (progressing): YES.) Long Term Goal  ? ?Lab Results  ?Component Value Date  ? HGBA1C 6.2 06/30/2021  ? ?Provided education to patient about basic DM disease process ?Discussed plans with patient for ongoing care management follow up and provided patient with direct contact information for care management team ?Advised patient, providing education and rationale, to check CBG  and record, calling the office for findings outside established parameters.   ?  Review of patient status, including review of consultants reports, relevant laboratory and other test results, and medications completed. ? ?Hypertension  Interventions:  (Status:  Goal on track:  NO.) Long Term Goal ?Last practice recorded BP readings:  ?BP Readings from Last 3 Encounters:  ?07/14/21 (!) 149/74  ?06/30/21 (!) 199/91  ?06/14/21 (!) 189/104  ? ?Most recent eGFR/CrCl:  ?Lab Results  ?Component Value Date  ? EGFR 72 06/30/2021  ?  No components found for: CRCL ? ?Evaluation of current treatment plan related to hypertension self management and patient's adherence to plan as established by provider ?Provided education to patient re: stroke prevention, s/s of heart attack and stroke ?Counseled on the importance of exercise goals  ?Discussed plans with patient for ongoing care management follow up and provided patient with direct contact information for care management team ? ?Heart Failure Interventions:  (Status:  Goal on track:  Yes.) Long Term Goal ?Basic overview and discussion of pathophysiology of Heart Failure reviewed ?Provided education on low sodium diet ?Reviewed Heart Failure Action Plan in depth and provided written copy ?Discussed importance of daily weight and advised patient to weigh and record daily ?self-awareness of signs/symptoms of worsening disease encouraged ?    ?08/25/21:   spoke with Ray, who is Desiree Parker's son. He provided his number to be contacted for any updates or important news. Desiree Parker number is [phone]. Ray informed me that his mother experiences shortness of breath when walking short distances. I suggested exercising more as tolerated and helping her learn how to use the blood pressure machine. Later, I called Desiree Parker, happy to report that she no longer needed to see the Chiropractor. She was having lunch at the center and had not checked her weight or blood pressure, but she did mention that her blood sugar was 117. I explained the importance of keeping track of her health vital signs, and she indicated that she understood. ? ?Patient Goals/Self Care Activities: ?-Patient will self administer medications as  prescribed as evidenced by self report/primary caregiver report  ?-Patient will attend all scheduled provider appointments as evidenced by clinician review of documented attendance to scheduled appointments and patient/caregiver report ?-Patient will call pharmacy for medication refills as evidenced by patient report and review of pharmacy fill history as appropriate ?-Patient will call provider office for new concerns or questions as evidenced by review of documented incoming telephone call notes and patient report ?-Patient verbalizes understanding of plan ?-Patient will focus on medication adherence by take medication as prescribed ?-Calls provider office for new concerns, questions, or BP outside discussed parameters ?-Checks BP and records as discussed ?-Follows a low sodium diet/DASH diet ?-check blood sugar at prescribed times record values ?-take the blood sugar meter to all doctor visits  ?-Weigh daily and record (notify MD with 3 lb weight gain over night or 5 lb in a week) ?-Follow CHF Action Plan ? ?  ? ? ? ?  ? ?[name] RN, BSN, [place] ?Care Management Coordinator ?[place]  ?Phone: [phone]  ?  ? ? ? ? ? ? ? ? ?

## 2021-09-24 ENCOUNTER — Ambulatory Visit: Payer: Medicare Other

## 2021-09-25 NOTE — Patient Instructions (Signed)
Visit Information  Desiree Parker  it was nice speaking with you. Please call me directly [phone] if you have questions about the goals we discussed.  Patient Goals/Self Care Activities: -Patient will self administer medications as prescribed as evidenced by self report/primary caregiver report  -Patient will attend all scheduled provider appointments as evidenced by clinician review of documented attendance to scheduled appointments and patient/caregiver report -Patient will call pharmacy for medication refills as evidenced by patient report and review of pharmacy fill history as appropriate -Patient will call provider office for new concerns or questions as evidenced by review of documented incoming telephone call notes and patient report -Patient verbalizes understanding of plan -Patient will focus on medication adherence by take medication as prescribed -Calls provider office for new concerns, questions, or BP outside discussed parameters -Checks BP and records as discussed -Follows a low sodium diet/DASH diet -check blood sugar at prescribed times record values -take the blood sugar meter to all doctor visits  -Weigh daily and record (notify MD with 3 lb weight gain over night or 5 lb in a week) -Follow CHF Action Plan       The patient verbalized understanding of instructions, educational materials, and care plan provided today and agreed to receive a mailed copy of patient instructions, educational materials, and care plan.   Follow up Plan: Patient would like continued follow-up.  CCM RNCM will outreach the patient within the next 4 weeks.  Patient will call office if needed prior to next encounter  [name], RN  [phone]

## 2021-09-25 NOTE — Chronic Care Management (AMB) (Signed)
Chronic Care Management   CCM RN Visit Note  09/25/2021 Name: Desiree Parker MRN: [id number] DOB: [birth year]  Subjective: Desiree Parker is a 76 y.o. year old female who is a primary care patient of [name], MD. The care management team was consulted for assistance with disease management and care coordination needs.    Engaged with patient by telephone for follow up visit in response to provider referral for case management and/or care coordination services.   Consent to Services:  The patient was given information about Chronic Care Management services, agreed to services, and gave verbal consent prior to initiation of services.  Please see initial visit note for detailed documentation.   Patient agreed to services and verbal consent obtained.    Summary:  The patient is making progress but needs to check her blood pressures. . See Care Plan below for interventions and patient self-care actives.  Recommendation: The patient may benefit from taking medications as prescribed, weighing Daily and record values, checking Blood sugar and record values, checking blood pressures and record values, and The patient agrees.  Follow up Plan: Patient would like continued follow-up.  CCM RNCM will outreach the patient within the next 4 weeks.  Patient will call office if needed prior to next encounter   Assessment: Review of patient past medical history, allergies, medications, health status, including review of consultants reports, laboratory and other test data, was performed as part of comprehensive evaluation and provision of chronic care management services.   SDOH (Social Determinants of Health) assessments and interventions performed:  No     Conditions to be addressed/monitored:CHF, HTN, and DMII  Care Plan : RN Care Manager  Updates made by [name], RN since 09/25/2021 12:00 AM     Problem: Management of CHF DM II HTN   Priority: High  Onset Date: 09/10/2019  Note:    Mrs. Desiree Parker any chest paain shortness of breath or swselling, .   Long-Range Goal: Management of CHF, DM II and HTN   Note:   Chronic Disease Management support, education, and care coordination needs related to CHF, HTN, and DM II  Clinical Goal(s) related to CHF, HTN, and DM II:  Over the next 90 days, patient will:  Work with the care management team to address educational, disease management, and care coordination needs  Begin or continue self health monitoring activities as directed today Measure and record CBG (blood glucose) one times daily, Measure and record blood pressure three times per week, and Measure and record weight daily Call provider office for new or worsened signs and symptoms  Call care management team with questions or concerns  Interventions: 1:1 collaboration with primary care provider regarding development and update of comprehensive plan of care as evidenced by provider attestation and co-signature Inter-disciplinary care team collaboration (see longitudinal plan of care) Evaluation of current treatment plan related to  self management and patient's adherence to plan as established by provider   Diabetes:  (Status: Goal on Track (progressing): YES.) Long Term Goal   Lab Results  Component Value Date   HGBA1C 6.2 06/30/2021   Provided education to patient about basic DM disease process Discussed plans with patient for ongoing care management follow up and provided patient with direct contact information for care management team Advised patient, providing education and rationale, to check CBG  and record, calling the office for findings outside established parameters.   Review of patient status, including review of consultants reports, relevant laboratory and other  test results, and medications completed.  Hypertension Interventions:  (Status:  Goal on track:  NO.) Long Term Goal Last practice recorded BP readings:  BP Readings from Last 3 Encounters:   07/14/21 (!) 149/74  06/30/21 (!) 199/91  06/14/21 (!) 189/104   Most recent eGFR/CrCl:  Lab Results  Component Value Date   EGFR 72 06/30/2021    No components found for: "CRCL"  Evaluation of current treatment plan related to hypertension self management and patient's adherence to plan as established by provider Provided education to patient re: stroke prevention, s/s of heart attack and stroke Counseled on the importance of exercise goals  Discussed plans with patient for ongoing care management follow up and provided patient with direct contact information for care management team  Heart Failure Interventions:  (Status:  Goal on track:  Yes.) Long Term Goal Basic overview and discussion of pathophysiology of Heart Failure reviewed Provided education on low sodium diet Reviewed Heart Failure Action Plan in depth and provided written copy Discussed importance of daily weight and advised patient to weigh and record daily self-awareness of signs/symptoms of worsening disease encouraged     09/24/21:  The patient was reported to be in good health. She has not experienced chest pain, shortness of breath, or headaches. However, she did mention some swelling in her feet the previous day, but it subsided after she wore compression socks. Her weight is 207 lbs, and her blood sugar level is 114. She has not checked her blood pressure yet. I have explained the importance of monitoring her vitals and how they can affect her health. She expressed understanding and intended to write down the numbers. Her son, Desiree Parker, has taught her how to use a blood pressure machine, but she has packed it away due to new carpeting in her apartment. She has been walking more and even made a trip to the [facility] and back. She has been eating and sleeping well.   Patient Goals/Self Care Activities: -Patient will self administer medications as prescribed as evidenced by self report/primary caregiver report   -Patient will attend all scheduled provider appointments as evidenced by clinician review of documented attendance to scheduled appointments and patient/caregiver report -Patient will call pharmacy for medication refills as evidenced by patient report and review of pharmacy fill history as appropriate -Patient will call provider office for new concerns or questions as evidenced by review of documented incoming telephone call notes and patient report -Patient verbalizes understanding of plan -Patient will focus on medication adherence by take medication as prescribed -Calls provider office for new concerns, questions, or BP outside discussed parameters -Checks BP and records as discussed -Follows a low sodium diet/DASH diet -check blood sugar at prescribed times record values -take the blood sugar meter to all doctor visits  -Weigh daily and record (notify MD with 3 lb weight gain over night or 5 lb in a week) -Follow CHF Action Plan         [name] RN, BSN, [place] Management Coordinator [facility]  Phone: [phone]

## 2021-10-23 ENCOUNTER — Other Ambulatory Visit: Payer: Self-pay | Admitting: Family Medicine

## 2021-10-23 ENCOUNTER — Ambulatory Visit (INDEPENDENT_AMBULATORY_CARE_PROVIDER_SITE_OTHER): Payer: Medicare Other | Admitting: Family Medicine

## 2021-10-23 ENCOUNTER — Encounter: Payer: Self-pay | Admitting: Family Medicine

## 2021-10-23 VITALS — BP 145/83 | HR 78 | Ht 60.0 in | Wt 203.0 lb

## 2021-10-23 DIAGNOSIS — I10 Essential (primary) hypertension: Secondary | ICD-10-CM

## 2021-10-23 DIAGNOSIS — R0600 Dyspnea, unspecified: Secondary | ICD-10-CM | POA: Diagnosis not present

## 2021-10-23 DIAGNOSIS — E1165 Type 2 diabetes mellitus with hyperglycemia: Secondary | ICD-10-CM | POA: Diagnosis not present

## 2021-10-23 DIAGNOSIS — E785 Hyperlipidemia, unspecified: Secondary | ICD-10-CM | POA: Diagnosis not present

## 2021-10-23 DIAGNOSIS — M7989 Other specified soft tissue disorders: Secondary | ICD-10-CM

## 2021-10-23 DIAGNOSIS — M79671 Pain in right foot: Secondary | ICD-10-CM | POA: Diagnosis not present

## 2021-10-23 DIAGNOSIS — Z91148 Patient's other noncompliance with medication regimen for other reason: Secondary | ICD-10-CM

## 2021-10-23 DIAGNOSIS — J449 Chronic obstructive pulmonary disease, unspecified: Secondary | ICD-10-CM | POA: Diagnosis not present

## 2021-10-23 DIAGNOSIS — I5032 Chronic diastolic (congestive) heart failure: Secondary | ICD-10-CM

## 2021-10-23 LAB — POCT GLYCOSYLATED HEMOGLOBIN (HGB A1C): HbA1c, POC (controlled diabetic range): 6.5 % (ref 0.0–7.0)

## 2021-10-23 MED ORDER — SPIRIVA HANDIHALER 18 MCG IN CAPS: ORAL_CAPSULE | RESPIRATORY_TRACT | 2 refills | Status: DC

## 2021-10-23 MED ORDER — NAPROXEN 500 MG PO TABS: 500.0000 mg | ORAL_TABLET | Freq: Two times a day (BID) | ORAL | 0 refills | Status: DC

## 2021-10-23 MED ORDER — FLUTICASONE FUROATE-VILANTEROL 100-25 MCG/ACT IN AEPB: 1.0000 | INHALATION_SPRAY | Freq: Every day | RESPIRATORY_TRACT | 1 refills | Status: DC

## 2021-10-23 NOTE — Assessment & Plan Note (Addendum)
She is compliant with her meds. BP was initially elevated but improved some during this visit. She does not check her BP at home regularly. Home BP monitoring was discussed with her and her son. Reassess at her next visit.

## 2021-10-23 NOTE — Assessment & Plan Note (Signed)
Poor adherence to her controller inhalers - Spiriva and Breo Ellipta Education and refills provided. F/U with pulm as scheduled.

## 2021-10-23 NOTE — Assessment & Plan Note (Signed)
Continue same dose Lipitor.

## 2021-10-23 NOTE — Assessment & Plan Note (Signed)
A1C of 6.5 today. Continue same Metformin regimen.

## 2021-10-23 NOTE — Assessment & Plan Note (Signed)
Unclear if it is related to her SOB. She does not appear fluid overloaded. 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 However, her most recent ECHO shows normal EF and diastolic function. I discussed cardiology referral consideration. We will proceed with this after pulm evaluation and good compliance with her COPD medications. F/U in 4 weeks for reassessment or sooner if symptoms worsen. She and her son agreed with the plan.

## 2021-10-23 NOTE — Patient Instructions (Signed)
Gout  Gout is painful swelling of your joints. Gout is a type of arthritis. It is caused by having too much uric acid in your body. Uric acid is a chemical that is made when your body breaks down substances called purines. If your body has too much uric acid, sharp crystals can form and build up in your joints. This causes pain and swelling. Gout attacks can happen quickly and be very painful (acute gout). Over time, the attacks can affect more joints and happen more often (chronic gout). What are the causes? Gout is caused by too much uric acid in your blood. This can happen because: Your kidneys do not remove enough uric acid from your blood. Your body makes too much uric acid. You eat too many foods that are high in purines. These foods include organ meats, some seafood, and beer. Trauma or stress can bring on an attack. What increases the risk? Having a family history of gout. Being female and middle-aged. Being female and having gone through menopause. Having an organ transplant. Taking certain medicines. Having certain conditions, such as: Being very overweight (obese). Lead poisoning. Kidney disease. A skin condition called psoriasis. Other risks include: Losing weight too quickly. Not having enough water in the body (being dehydrated). Drinking alcohol, especially beer. Drinking beverages that are sweetened with a type of sugar called fructose. What are the signs or symptoms? An attack of acute gout often starts at night and usually happens in just one joint. The most common place is the big toe. Other joints that may be affected include joints of the feet, ankle, knee, fingers, wrist, or elbow. Symptoms may include: Very bad pain. Warmth. Swelling. Stiffness. Tenderness. The affected joint may be very painful to touch. Shiny, red, or purple skin. Chills and fever. Chronic gout may cause symptoms more often. More joints may be involved. You may also have white or yellow lumps  (tophi) on your hands or feet or in other areas near your joints. How is this treated? Treatment for an acute attack may include medicines for pain and swelling, such as: NSAIDs, such as ibuprofen. Steroids taken by mouth or injected into a joint. Colchicine. This can be given by mouth or through an IV tube. Treatment to prevent future attacks may include: Taking small doses of NSAIDs or colchicine daily. Using a medicine that reduces uric acid levels in your blood, such as allopurinol. Making changes to your diet. You may need to see a food expert (dietitian) about what to eat and drink to prevent gout. Follow these instructions at home: During a gout attack  If told, put ice on the painful area. To do this: Put ice in a plastic bag. Place a towel between your skin and the bag. Leave the ice on for 20 minutes, 2-3 times a day. Take off the ice if your skin turns bright red. This is very important. If you cannot feel pain, heat, or cold, you have a greater risk of damage to the area. Raise the painful joint above the level of your heart as often as you can. Rest the joint as much as possible. If the joint is in your leg, you may be given crutches. Follow instructions from your doctor about what you cannot eat or drink. Avoiding future gout attacks Eat a low-purine diet. Avoid foods and drinks such as: Liver. Kidney. Anchovies. Asparagus. Herring. Mushrooms. Mussels. Beer. Stay at a healthy weight. If you want to lose weight, talk with your doctor. Do not   lose weight too fast. Start or continue an exercise plan as told by your doctor. Eating and drinking Avoid drinks sweetened by fructose. Drink enough fluids to keep your pee (urine) pale yellow. If you drink alcohol: Limit how much you have to: 0-1 drink a day for women who are not pregnant. 0-2 drinks a day for men. Know how much alcohol is in a drink. In the [place] one drink equals one 12 oz bottle of beer (355 mL), one 5 oz  glass of wine (148 mL), or one 1 oz glass of hard liquor (44 mL). General instructions Take over-the-counter and prescription medicines only as told by your doctor. Ask your doctor if you should avoid driving or using machines while you are taking your medicine. Return to your normal activities when your doctor says that it is safe. Keep all follow-up visits. Where to find more information National Institutes of Health: www.niams.nih.gov Contact a doctor if: You have another gout attack. You still have symptoms of a gout attack after 10 days of treatment. You have problems (side effects) because of your medicines. You have chills or a fever. You have burning pain when you pee (urinate). You have pain in your lower back or belly. Get help right away if: You have very bad pain. Your pain cannot be controlled. You cannot pee. Summary Gout is painful swelling of the joints. The most common site of pain is the big toe, but it can affect other joints. Medicines and avoiding some foods can help to prevent and treat gout attacks. This information is not intended to replace advice given to you by your health care provider. Make sure you discuss any questions you have with your health care provider. Document Revised: 12/31/2020 Document Reviewed: 12/31/2020 Elsevier Patient Education  2023 [employer]  

## 2021-10-23 NOTE — Assessment & Plan Note (Signed)
Swelling, tenderness, and erythema ?? Gout vs OA vs RA History of similar episode in 20217 and gout was considered then. However, her urin acid was normal. Uric acid ordered today. Naproxen treatment for 21 days. Consider xray in the future is there is no improvement. She and her son agreed with the plan.

## 2021-10-23 NOTE — Progress Notes (Addendum)
    SUBJECTIVE:   CHIEF COMPLAINT / HPI:   DM/HTN/HLD: She is here for f/u. Compliant with Lipitor 20 mg QD, metformin 500 mg QOD, Lisinopril 20 mg QD and Norvasc 5 mg QD.  SOB/COPD:  She still has SOB on exertion. She uses albuterol as needed and does not use her Spiriva and Breo ellipta. She a pulm appointment on 7/19. She denies chest pain, chest tightness, or palpitations.   Leg edema:  Patient c/o right foot pain and swelling x 2 weeks. She endorses occasional B/L LL swelling, which has been ongoing for > 4 months. However, her right foot swelling worsened with pain and redness x 2 weeks. She denies trauma to her foot. Her salt intake is appropriate, and she keeps her feet elevated regularly.    Toenail discoloration: Left 2nd digit toenail discoloration ongoing for a few weeks. She denies trauma to her toe.  PERTINENT  PMH / PSH: PMHx reviewed  OBJECTIVE:   Vitals:   10/23/21 0831 10/23/21 0855 10/23/21 0858  BP: (!) 154/69 (!) 155/66 (!) 145/83  Pulse: 78    SpO2: 98%    Weight: 203 lb (92.1 kg)    Height: 5' (1.524 m)      Physical Exam Vitals and nursing note reviewed.  Cardiovascular:     Rate and Rhythm: Normal rate and regular rhythm.     Heart sounds: Normal heart sounds. No murmur heard. Pulmonary:     Effort: Pulmonary effort is normal. No respiratory distress.     Breath sounds: Normal breath sounds. No wheezing.  Musculoskeletal:     Comments: Neg swelling, tenderness or edema of her left foot.  Mild erythema, swelling and tenderness across her right metatarsal, from her great toe to the 5th toe. No calf swelling or tenderness. Yellowish black discoloration of her left 2nd toenail.      ASSESSMENT/PLAN:   HYPERTENSION, BENIGN SYSTEMIC  She is compliant with her meds. BP was initially elevated but improved some during this visit. She does not check her BP at home regularly. Home BP monitoring was discussed with her and her son. Reassess at her  next visit.   Diabetes mellitus, type II ([place]) A1C of 6.5 today. Continue same Metformin regimen.   Hyperlipidemia Continue same dose Lipitor.   COPD (chronic obstructive pulmonary disease) (HCC) Poor adherence to her controller inhalers - Spiriva and Breo Ellipta Education and refills provided. F/U with pulm as scheduled.   Chronic diastolic heart failure ([place]) Unclear if it is related to her SOB. She does not appear fluid overloaded. 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 However, her most recent ECHO shows normal EF and diastolic function. I discussed cardiology referral consideration. We will proceed with this after pulm evaluation and good compliance with her COPD medications. F/U in 4 weeks for reassessment or sooner if symptoms worsen. She and her son agreed with the plan.   Swelling of right foot Swelling, tenderness, and erythema ?? Gout vs OA vs RA History of similar episode in 20217 and gout was considered then. However, her urin acid was normal. Uric acid ordered today. Naproxen treatment for 21 days. Consider xray in the future is there is no improvement. She and her son agreed with the plan.    Toenail discoloration ?? Onychomycosis Schedule Derm clinic appointment for evaluation and treatment. She agreed with the plan.   Andrena Mews, MD [place]

## 2021-10-24 LAB — MICROALBUMIN / CREATININE URINE RATIO
Creatinine, Urine: 25.8 mg/dL
Microalb/Creat Ratio: 29 mg/g creat (ref 0–29)
Microalbumin, Urine: 7.6 ug/mL

## 2021-10-24 LAB — URIC ACID: Uric Acid: 3.6 mg/dL (ref 3.1–7.9)

## 2021-10-26 ENCOUNTER — Telehealth: Payer: Self-pay | Admitting: Family Medicine

## 2021-10-26 NOTE — Progress Notes (Unsigned)
Synopsis: Referred for dyspnea by Desiree Feil, MD  Subjective:   PATIENT ID: Desiree Parker: female DOB: 1946/02/27, MRN: [id number]  Chief Complaint  Patient presents with   Consult    SOB with exertion x3 years   75yF with history of smoking 60 py quit 1990, memory issues, HTN referred for dyspnea  She has had DOE for 3 years. Trouble walking to her apt or out to her son's car without getting winded. She is taking breo which she thinks may be helpful, rinses mouth after using it. She has an albuterol rescue inhaler which sometimes helps when she uses it. She snores, is sleepy during the day. No PND. No witnessed apneas.   She has gained 13 lb since her last sleep study.   Doesn't look like she's needed any courses of steroids or ABX for AECOPD/asthma.   Otherwise pertinent review of systems is negative.  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 No MJ, vaping.    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  There is infiltrate consistent with pneumonia in portions of the right lower lobe. There is patchy atelectasis in the left lower lobe. There is no appreciable adenopathy. The prominence in the right hilum noted previously is apparently due to vascular prominence as opposed to mass or adenopathy. 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   Noncompliance with medications 09/11/2015   Osteoporosis    TOBACCO USER 03/04/2009   Snuff dipper       Family History  Problem Relation Age of Onset   Diabetes Father    Hypertension Mother    Diabetes Mother    Lung cancer Mother    Hypertension Sister    Asthma Son    Colon polyps Neg Hx    Esophageal cancer Neg Hx    Stomach cancer Neg Hx    Rectal cancer Neg Hx      Past Surgical History:  Procedure Laterality Date   [place]   states gallstones removed   TUBAL LIGATION  1975    Social History   Socioeconomic History   Marital status: Single    Spouse name: Not on file   Number of children: 1   Years of education: 12   Highest education level: Not on file  Occupational History   Not on file  Tobacco Use   Smoking status: Former    Packs/day: 2.00    Years: 27.00    Total pack years: 54.00    Types: Cigarettes    Quit date: 09/24/1988    Years since quitting: 33.1    Passive exposure: Past   Smokeless tobacco: Former    Types: Snuff    Quit  date: 07/05/2018  Substance and Sexual Activity   Alcohol use: Yes    Alcohol/week: 2.0 standard drinks of alcohol    Types: 2 Standard drinks or equivalent per week   Drug use: No   Sexual activity: Not Currently    Birth control/protection: Surgical  Other Topics Concern   Not on file  Social History Narrative   Lives alone, retired Geophysicist/field seismologist   Son, Desiree Parker in [place] Strain: Not on [employer] Insecurity: Not on file  Transportation Needs: Not on file  Physical Activity: Not on file  Stress: Not on file  Social Connections: Not on file  Intimate Partner Violence: Not on file     No Known Allergies   Outpatient Medications Prior to Visit  Medication Sig Dispense Refill   acetaminophen (TYLENOL) 325 MG tablet Take 650 mg  by mouth every 4 (four) hours as needed for mild pain.     albuterol (VENTOLIN HFA) 108 (90 Base) MCG/ACT inhaler Inhale 2 puffs into the lungs every 6 (six) hours as needed for wheezing or shortness of breath. 25.5 g 6   alendronate (FOSAMAX) 70 MG tablet TAKE 1 TABLET BY MOUTH EVERY 7 DAYS WITH A FULL GLASS OF WATER AND ON AN EMPTY STOMACH 12 tablet 2   amLODipine (NORVASC) 5 MG tablet Take 1 tablet (5 mg total) by mouth at bedtime. 90 tablet 3   aspirin EC 81 MG tablet Take 1 tablet (81 mg total) by mouth daily. Swallow whole. 30 tablet 11   atorvastatin (LIPITOR) 20 MG tablet TAKE 1 TABLET(20 MG) BY MOUTH DAILY AT 6 PM 90 tablet 1   Blood Glucose Monitoring Suppl (ONETOUCH VERIO REFLECT) w/Device KIT 1 Units by Does not apply route 3 (three) times daily. 1 kit 0   Blood Glucose Monitoring Suppl (ONETOUCH VERIO) w/Device KIT To check blood sugar once daily. E11.9 1 kit 0   calcium-vitamin D (OSCAL WITH D) 500-200 MG-UNIT tablet Take 1 tablet by mouth 2 (two) times daily. 60 tablet 4   fluticasone furoate-vilanterol (BREO ELLIPTA) 100-25 MCG/ACT AEPB Inhale 1 puff into the lungs daily. 60 each 1   glucose blood (ONETOUCH VERIO) test strip TO CHECK BLOOD SUGAR ONCE DAILY 100 strip 3   Lancet Devices (ONE TOUCH DELICA LANCING DEV) MISC To check blood sugar once daily. E11.9 1 each 0   Lancets (ONETOUCH DELICA PLUS DXAJOI78M) MISC TO CHECK BLOOD SUGAR ONCE DAILY 100 each 3   lisinopril (ZESTRIL) 20 MG tablet Take 1 tablet (20 mg total) by mouth daily. 90 tablet 1   metFORMIN (GLUCOPHAGE-XR) 500 MG 24 hr tablet TAKE 1 TABLET(500 MG) BY MOUTH EVERY OTHER DAY 45 tablet 3   methocarbamol (ROBAXIN) 500 MG tablet Take 1 tablet (500 mg total) by mouth 2 (two) times daily. 20 tablet 0   naproxen (NAPROSYN) 500 MG tablet Take 1 tablet (500 mg total) by mouth 2 (two) times daily with a meal for 21 days. 42 tablet 0   omeprazole (PRILOSEC) 20 MG capsule TAKE ONE CAPSULE BY MOUTH EVERY DAY AS NEEDED. PROLONGED  USE. MAY CAUSE RENAL IMPAIRMENT 90 capsule 1   tiotropium (SPIRIVA HANDIHALER) 18 MCG inhalation capsule INHALE THE CONTENT OF 1 CAPSULE VIA HANDIHALER BY MOUTH EVERY MORNING 90 capsule 2   Facility-Administered Medications Prior to Visit  Medication Dose Route Frequency Provider Last Rate Last Admin   0.9 %  sodium chloride infusion  500 mL Intravenous Once Nelida Meuse III, MD       0.9 %  sodium chloride infusion  500 mL Intravenous Once Doran Stabler, MD           Objective:   Physical Exam:  General appearance: 76 y.o., female, NAD, conversant  Eyes: anicteric sclerae; PERRL, tracking appropriately HENT: NCAT; MMM Neck: Trachea midline; no lymphadenopathy, no JVD Lungs: CTAB, no crackles, no wheeze, with normal respiratory effort CV: RRR, no murmur  Abdomen: Soft, non-tender; non-distended, BS present  Extremities: No peripheral edema, warm Skin: Normal turgor and texture; no rash Psych: Appropriate affect Neuro: Alert and oriented to person and place, no focal deficit     Vitals:   10/28/21 1507  BP: (!) 162/88  Pulse: 81  Temp: 98.3 F (36.8 C)  TempSrc: Oral  SpO2: 97%  Weight: 205 lb 9.6 oz (93.3 kg)  Height: '4\' 11"'  (1.499 m)   97% on RA BMI Readings from Last 3 Encounters:  10/28/21 41.53 kg/m  10/23/21 39.65 kg/m  07/14/21 39.65 kg/m   Wt Readings from Last 3 Encounters:  10/28/21 205 lb 9.6 oz (93.3 kg)  10/23/21 203 lb (92.1 kg)  07/14/21 203 lb (92.1 kg)     CBC    Component Value Date/Time   WBC 11.3 (H) 06/20/2019 1024   RBC 4.36 06/20/2019 1024   HGB 13.0 06/20/2019 1024   HGB 12.7 03/07/2019 0939   HCT 38.3 06/20/2019 1024   HCT 37.1 03/07/2019 0939   PLT 314.0 06/20/2019 1024   PLT 257 03/07/2019 0939   MCV 88.0 06/20/2019 1024   MCV 88 03/07/2019 0939   MCH 30.2 03/07/2019 0939   MCH 29.3 10/29/2015 0756   MCHC 34.0 06/20/2019 1024   RDW 13.1 06/20/2019 1024   RDW 12.7 03/07/2019 0939   LYMPHSABS 3.8 06/20/2019 1024    MONOABS 0.8 06/20/2019 1024   EOSABS 0.3 06/20/2019 1024   BASOSABS 0.1 06/20/2019 1024      Chest Imaging: CT Chest 07/10/21 reviewed by me unremarkable  CXR 10/28/21 reviewed by me unchanged  Pulmonary Functions Testing Results:    Latest Ref Rng & Units 09/04/2019   10:46 AM  PFT Results  FVC-Pre L 1.97  P  FVC-Predicted Pre % 111  P  FVC-Post L 1.99  P  FVC-Predicted Post % 112  P  Pre FEV1/FVC % % 85  P  Post FEV1/FCV % % 88  P  FEV1-Pre L 1.67  P  FEV1-Predicted Pre % 123  P  FEV1-Post L 1.75  P  DLCO uncorrected ml/min/mmHg 12.56  P  DLCO UNC% % 77  P  DLCO corrected ml/min/mmHg 12.56  P  DLCO COR %Predicted % 77  P  DLVA Predicted % 94  P    P Preliminary result   Mild restriction, low erv, borderline normal DLCO  PSG 08/2019: IMPRESSIONS - Mild obstructive sleep apnea occurred during this study (AHI = 8.8/h). - No significant central sleep apnea occurred during this study (CAI = 0.0/h). - Oxygen desaturation was noted during this study (Min O2 = 86.00%). Mean sat 94.89%. - Time with O2 saturation 88% or less was 0.9 minutes. - The patient snored with soft snoring volume. - EKG findings include PVCs. - Clinically significant periodic limb movements did not occur during sleep. No significant associated arousals.  Echocardiogram:   TTE 07/02/21:  1. Left ventricular ejection fraction, by estimation, is 55 to 60%. The  left ventricle has normal function. The left ventricle has no regional  wall motion abnormalities. Left ventricular diastolic parameters were  normal.   2. Right ventricular systolic function is normal. The right ventricular  size is normal. Tricuspid regurgitation signal is inadequate for assessing  PA pressure.   3. The mitral valve is grossly normal. Trivial mitral valve  regurgitation. No evidence of mitral stenosis.   4. The aortic valve is grossly normal. Aortic valve regurgitation is not  visualized. No aortic stenosis is present.    Nuke stress 07/2019:  The left ventricular ejection fraction is hyperdynamic (>65%). Nuclear stress EF: 75%. There was no ST segment deviation noted during stress. The study is normal. This is a low risk study.    Assessment & Plan:   # DOE  May be multifactorial and unclear to what extent deconditioning, obesity, untreated OSA playing roles. Prior testing does not show evidence of asthma/COPD despite her smoking history.  Plan: - cbc, bmp, tsh, bnp - PFTs next visit - ok to continue breo 1 puff once daily, rinse mouth afterward - albuterol prn   RTC 6 weeks with PFT   Maryjane Hurter, MD [facility] 10/28/2021 3:23 PM

## 2021-10-26 NOTE — Telephone Encounter (Signed)
HIPAA compliant callback message left.  Please, advise her that her urine protein test for a diabetic patient is normal and that her uric acid test for gout is negative. Foot swelling and pain may be arthritis. F/U with in in about 2 weeks for reassessment if there is no improvement.

## 2021-10-26 NOTE — Telephone Encounter (Signed)
Patient's son returns call to nurse line. Signed DPR on file. Informed of message per Dr. Gwendlyn Deutscher.   Answered all questions.   Talbot Grumbling, RN

## 2021-10-27 ENCOUNTER — Ambulatory Visit: Payer: Medicare Other

## 2021-10-27 ENCOUNTER — Ambulatory Visit: Payer: Medicare Other | Admitting: Family Medicine

## 2021-10-27 NOTE — Chronic Care Management (AMB) (Signed)
RNCM Care Management Note 10/27/2021 Name: Desiree Parker MRN: [id number] DOB: [birth year]   Desiree Parker is a 76 y.o. year old female who sees [name], MD for primary care. RNCM was consulted  to assistance patient with  Care Coordination.      Engaged with patient by telephone for follow up visit in response to provider referral for Care Coordination.     Summary: Desiree Parker, who informed me that she is feeling alright and trying to stay cool in the hot weather. Although she is experiencing some shortness of breath, which is typical for her, she denies any chest pain. Additionally, she has been having some swelling in her feet, which she attributes to a recent change in her blood pressure medication. . See Care Plan below for interventions and patient self-care actives.  Recommendation: The patient may benefit from taking medications as prescribed, exercising as tolerated, monitoring food intake, watch fluid intake, weighing Daily and record values, checking Blood sugar and record values, checking blood pressures and record values, calling your physician if numbers are abnormal, and The patient agrees.  Follow up Plan: Patient would like continued follow-up.  CM RNCM will outreach the patient within the next 4 weeks.  Patient will call office if needed prior to next encounter   SDOH (Social Determinants of Health) screening and interventions performed today: no     RN Plan of Care for Conditions/Un Met Needs Addressed and Monitored  Patient Care Plan: RN Care Manager     Problem Identified: Management of CHF DM II HTN Resolved 10/27/2021  Priority: High  Onset Date: 09/10/2019  Note:   Mrs. Desiree Parker any chest paain shortness of breath or swselling, .   Long-Range Goal: Management of CHF, DM II and HTN   Note:   Chronic Disease Management support, education, and care coordination needs related to CHF, HTN, and DM II  Clinical Goal(s) related to CHF, HTN, and DM II:   Over the next 90 days, patient will:  Work with the care management team to address educational, disease management, and care coordination needs  Begin or continue self health monitoring activities as directed today Measure and record CBG (blood glucose) one times daily, Measure and record blood pressure three times per week, and Measure and record weight daily Call provider office for new or worsened signs and symptoms  Call care management team with questions or concerns  Interventions: 1:1 collaboration with primary care provider regarding development and update of comprehensive plan of care as evidenced by provider attestation and co-signature Inter-disciplinary care team collaboration (see longitudinal plan of care) Evaluation of current treatment plan related to  self management and patient's adherence to plan as established by provider   Diabetes:  (Status: Goal on Track (progressing): YES.) Long Term Goal   Lab Results  Component Value Date   HGBA1C 6.5 10/23/2021   Provided education to patient about basic DM disease process Discussed plans with patient for ongoing care management follow up and provided patient with direct contact information for care management team Advised patient, providing education and rationale, to check CBG  and record, calling the office for findings outside established parameters.   Review of patient status, including review of consultants reports, relevant laboratory and other test results, and medications completed.  Hypertension Interventions:  (Status:  Goal on track:  NO.) Long Term Goal Last practice recorded BP readings:  BP Readings from Last 3 Encounters:  10/23/21 (!) 145/83  07/14/21 (!) 149/74  06/30/21 (!) 199/91   Most recent eGFR/CrCl:  Lab Results  Component Value Date   EGFR 72 06/30/2021    No components found for: "CRCL"  Evaluation of current treatment plan related to hypertension self management and patient's adherence to  plan as established by provider Provided education to patient re: stroke prevention, s/s of heart attack and stroke Counseled on the importance of exercise goals  Discussed plans with patient for ongoing care management follow up and provided patient with direct contact information for care management team  Heart Failure Interventions:  (Status:  Goal on track:  Yes.) Long Term Goal Basic overview and discussion of pathophysiology of Heart Failure reviewed Provided education on low sodium diet Reviewed Heart Failure Action Plan in depth and provided written copy Discussed importance of daily weight and advised patient to weigh and record daily self-awareness of signs/symptoms of worsening disease encouraged     10/27/21:  Today, I spoke with Desiree Parker, who informed me that she is feeling alright and trying to stay cool in the hot weather. Although she is experiencing some shortness of breath, which is typical for her, she denies any chest pain. Additionally, she has been having some swelling in her feet, which she attributes to a recent change in her blood pressure medication. Desiree Parker's blood sugar this morning was 115, but she did not check her blood pressure. During our conversation, I emphasized the importance of monitoring her blood pressure regularly, and she assured me that she understood. Desiree Parker weight was 203 pounds, and she mentioned that she walks to the community center twice a week for lunch and socializing. I advised her to be cautious in the heat, and she acknowledged my advice.  Patient Goals/Self Care Activities: -Patient will self administer medications as prescribed as evidenced by self report/primary caregiver report  -Patient will attend all scheduled provider appointments as evidenced by clinician review of documented attendance to scheduled appointments and patient/caregiver report -Patient will call pharmacy for medication refills as evidenced by patient report and  review of pharmacy fill history as appropriate -Patient will call provider office for new concerns or questions as evidenced by review of documented incoming telephone call notes and patient report -Patient verbalizes understanding of plan -Patient will focus on medication adherence by take medication as prescribed -Calls provider office for new concerns, questions, or BP outside discussed parameters -Checks BP and records as discussed -Follows a low sodium diet/DASH diet -check blood sugar at prescribed times record values -take the blood sugar meter to all doctor visits  -Weigh daily and record (notify MD with 3 lb weight gain over night or 5 lb in a week) -Follow CHF Action Plan  Resolving due to duplicate goal               [name] RN, BSN, [place] Management Coordinator [facility]  Phone: [phone]

## 2021-10-27 NOTE — Patient Instructions (Signed)
Visit Information  Ms. Desiree Parker  it was nice speaking with you. Please call me directly [phone] if you have questions about the goals we discussed.  Patient Goals/Self Care Activities: -Patient will self administer medications as prescribed as evidenced by self report/primary caregiver report  -Patient will attend all scheduled provider appointments as evidenced by clinician review of documented attendance to scheduled appointments and patient/caregiver report -Patient will call pharmacy for medication refills as evidenced by patient report and review of pharmacy fill history as appropriate -Patient will call provider office for new concerns or questions as evidenced by review of documented incoming telephone call notes and patient report -Patient verbalizes understanding of plan -Patient will focus on medication adherence by take medication as prescribed -Calls provider office for new concerns, questions, or BP outside discussed parameters -Checks BP and records as discussed -Follows a low sodium diet/DASH diet -check blood sugar at prescribed times record values -take the blood sugar meter to all doctor visits  -Weigh daily and record (notify MD with 3 lb weight gain over night or 5 lb in a week) -Follow CHF Action Plan   The patient verbalized understanding of instructions, educational materials, and care plan provided today and agreed to receive a mailed copy of patient instructions, educational materials, and care plan.   Follow up Plan: Patient would like continued follow-up.  CM RNCM will outreach the patient within the next 4 weeks.  Patient will call office if needed prior to next encounter  [name], RN  [phone]

## 2021-10-27 NOTE — Chronic Care Management (AMB) (Signed)
   RN Case Manager Care Management   Phone Outreach    10/27/2021 Name: RYAN PALERMO MRN: [id number] DOB: [birth year]  NAKYAH ERDMANN is a 76 y.o. year old female who is a primary care patient of [place], Phill Myron, MD .   Unable to keep phone appointment today and requested to reschedule.  Follow Up Plan: Appointment was rescheduled with CCM RN on Today at 1 pm.     Review of patient status, including review of consultants reports, relevant laboratory and other test results, and collaboration with appropriate care team members and the patient's provider was performed as part of comprehensive patient evaluation and provision of care management services.    [name] RN, BSN, [facility] Care Management Coordinator [place] Phone: [phone] Fax: [phone]

## 2021-10-28 ENCOUNTER — Ambulatory Visit (INDEPENDENT_AMBULATORY_CARE_PROVIDER_SITE_OTHER): Payer: Medicare Other | Admitting: Student

## 2021-10-28 ENCOUNTER — Ambulatory Visit (INDEPENDENT_AMBULATORY_CARE_PROVIDER_SITE_OTHER): Payer: Medicare Other

## 2021-10-28 ENCOUNTER — Encounter: Payer: Self-pay | Admitting: Student

## 2021-10-28 VITALS — BP 162/88 | HR 81 | Temp 98.3°F | Ht 59.0 in | Wt 205.6 lb

## 2021-10-28 DIAGNOSIS — R0609 Other forms of dyspnea: Secondary | ICD-10-CM | POA: Diagnosis not present

## 2021-10-28 DIAGNOSIS — R0600 Dyspnea, unspecified: Secondary | ICD-10-CM | POA: Diagnosis not present

## 2021-10-28 LAB — CBC WITH DIFFERENTIAL/PLATELET
Basophils Absolute: 0.1 10*3/uL (ref 0.0–0.1)
Basophils Relative: 0.6 % (ref 0.0–3.0)
Eosinophils Absolute: 0.2 10*3/uL (ref 0.0–0.7)
Eosinophils Relative: 1.9 % (ref 0.0–5.0)
HCT: 37.5 % (ref 36.0–46.0)
Hemoglobin: 12.6 g/dL (ref 12.0–15.0)
Lymphocytes Relative: 37.1 % (ref 12.0–46.0)
Lymphs Abs: 3.2 10*3/uL (ref 0.7–4.0)
MCHC: 33.5 g/dL (ref 30.0–36.0)
MCV: 90.1 fl (ref 78.0–100.0)
Monocytes Absolute: 0.7 10*3/uL (ref 0.1–1.0)
Monocytes Relative: 7.9 % (ref 3.0–12.0)
Neutro Abs: 4.6 10*3/uL (ref 1.4–7.7)
Neutrophils Relative %: 52.5 % (ref 43.0–77.0)
Platelets: 269 10*3/uL (ref 150.0–400.0)
RBC: 4.17 Mil/uL (ref 3.87–5.11)
RDW: 13.6 % (ref 11.5–15.5)
WBC: 8.7 10*3/uL (ref 4.0–10.5)

## 2021-10-28 LAB — BRAIN NATRIURETIC PEPTIDE: Pro B Natriuretic peptide (BNP): 64 pg/mL (ref 0.0–100.0)

## 2021-10-28 LAB — TSH: TSH: 3.16 u[IU]/mL (ref 0.35–5.50)

## 2021-10-28 NOTE — Patient Instructions (Signed)
-   Labs today, x ray today - would stop breo 3 weeks before your PFTs. If you feel much worse after stopping breo just go back to taking it - albuterol as needed (don't take it on same day as PFTs) - breathing tests (PFTs) next visit in 6 weeks on same day as next appointment

## 2021-10-29 ENCOUNTER — Ambulatory Visit (INDEPENDENT_AMBULATORY_CARE_PROVIDER_SITE_OTHER): Payer: Medicare Other | Admitting: Student

## 2021-10-29 VITALS — BP 153/70 | HR 82 | Temp 97.9°F | Wt 204.8 lb

## 2021-10-29 DIAGNOSIS — E1169 Type 2 diabetes mellitus with other specified complication: Secondary | ICD-10-CM

## 2021-10-29 DIAGNOSIS — L608 Other nail disorders: Secondary | ICD-10-CM | POA: Diagnosis not present

## 2021-10-29 DIAGNOSIS — I10 Essential (primary) hypertension: Secondary | ICD-10-CM

## 2021-10-29 LAB — BASIC METABOLIC PANEL
BUN: 13 mg/dL (ref 6–23)
CO2: 26 mEq/L (ref 19–32)
Calcium: 9.4 mg/dL (ref 8.4–10.5)
Chloride: 103 mEq/L (ref 96–112)
Creatinine, Ser: 0.83 mg/dL (ref 0.40–1.20)
GFR: 69.19 mL/min (ref >60.00)
Glucose, Bld: 138 mg/dL — ABNORMAL HIGH (ref 70–99)
Potassium: 4.1 mEq/L (ref 3.5–5.1)
Sodium: 139 mEq/L (ref 135–145)

## 2021-10-29 MED ORDER — AMLODIPINE BESYLATE 10 MG PO TABS: 10.0000 mg | ORAL_TABLET | Freq: Every day | ORAL | 1 refills | Status: DC

## 2021-10-29 NOTE — Addendum Note (Signed)
Addended by: Andrena Mews T on: 10/29/2021 04:05 PM   Modules accepted: Orders

## 2021-10-29 NOTE — Patient Instructions (Addendum)
It was nice seeing you today. We clipped your toenail for fungal testing. We will treat if positive. Otherwise, we will monitor for now. Your blood pressure is still elevated. Please increase your Amlodipine to 10 mg daily. You can double up on the 5 mg tablet. I will see you back in 4 weeks for your BP. Please, call me soon if your right foot pain persist.

## 2021-10-29 NOTE — Addendum Note (Signed)
Addended by: Andrena Mews T on: 10/29/2021 04:41 PM   Modules accepted: Orders

## 2021-10-29 NOTE — Progress Notes (Signed)
  SUBJECTIVE:   CHIEF COMPLAINT / HPI:   Discolored toenail: Discolored left second toenail for the last couple weeks.  Unaware of any traumas and states this is the only toenail that has been affected.  PERTINENT  PMH / PSH: none pertinent  OBJECTIVE:  BP (!) 153/70   Pulse 82   Temp 97.9 F (36.6 C)   Wt 204 lb 12.8 oz (92.9 kg)   SpO2 99%   BMI 41.36 kg/m   General: NAD, pleasant, able to participate in exam Toenails: discolored lateral portion of left 2nd toenail with thickened discoloration of most toenails bilaterally     ASSESSMENT/PLAN:  Nail discoloration Trauma versus onychomycosis.  Nail sample obtained for fungal culture.  HYPERTENSION, BENIGN SYSTEMIC Elevated blood pressure for several visits and upon recheck.  Increase amlodipine to 10 mg daily.   Orders Placed This Encounter  Procedures   Fungus culture w smear    Order Specific Question:   Source    Answer:   Left 2nd toenail   Return in about 4 weeks (around 11/26/2021), or if symptoms worsen or fail to improve. Wells Guiles, DO 10/29/2021, 3:59 PM PGY-2, [place]

## 2021-10-29 NOTE — Assessment & Plan Note (Signed)
Elevated blood pressure for several visits and upon recheck.  Increase amlodipine to 10 mg daily.

## 2021-10-29 NOTE — Assessment & Plan Note (Addendum)
Trauma versus onychomycosis.  Nail sample obtained for fungal culture.

## 2021-11-12 ENCOUNTER — Encounter: Payer: Self-pay | Admitting: Student

## 2021-11-15 ENCOUNTER — Other Ambulatory Visit: Payer: Self-pay | Admitting: Family Medicine

## 2021-11-24 ENCOUNTER — Encounter: Payer: Self-pay | Admitting: Family Medicine

## 2021-11-24 ENCOUNTER — Ambulatory Visit (INDEPENDENT_AMBULATORY_CARE_PROVIDER_SITE_OTHER): Payer: Medicare Other | Admitting: Family Medicine

## 2021-11-24 DIAGNOSIS — J449 Chronic obstructive pulmonary disease, unspecified: Secondary | ICD-10-CM

## 2021-11-24 DIAGNOSIS — L608 Other nail disorders: Secondary | ICD-10-CM

## 2021-11-24 DIAGNOSIS — E1165 Type 2 diabetes mellitus with hyperglycemia: Secondary | ICD-10-CM

## 2021-11-24 DIAGNOSIS — I10 Essential (primary) hypertension: Secondary | ICD-10-CM

## 2021-11-24 NOTE — Assessment & Plan Note (Signed)
BP looks good today. Continue current regimen. 

## 2021-11-24 NOTE — Assessment & Plan Note (Signed)
Stable. F/U for eye exam as scheduled.

## 2021-11-24 NOTE — Assessment & Plan Note (Signed)
Fungal culture report discussed with her. Negative for fungal infection. Likely traumatic toenail changes. Her nail is back to normal. Monitor for now.

## 2021-11-24 NOTE — Progress Notes (Signed)
    SUBJECTIVE:   CHIEF COMPLAINT / HPI:   Dyspnea/COPD: SOB improved. She is now compliant with her Breo and Spiriva. She has PFT scheduled for next month.   DM2:  COmplaint with Metformin. She has an eye exam scheduled for next month.   Toenail discoloration: The discolored toenail has grown out. She is here for f/u. No new concerns.   HTN: Here for f/u.  PERTINENT  PMH / PSH: PMHx reviewed  OBJECTIVE:   BP (!) 140/82   Pulse 79   Ht '4\' 11"'$  (1.499 m)   Wt 204 lb 12.8 oz (92.9 kg)   SpO2 99%   BMI 41.36 kg/m   Physical Exam Vitals and nursing note reviewed.  Cardiovascular:     Rate and Rhythm: Normal rate and regular rhythm.     Heart sounds: Normal heart sounds. No murmur heard. Pulmonary:     Effort: Pulmonary effort is normal. No respiratory distress.     Breath sounds: Normal breath sounds. No wheezing.  Musculoskeletal:     Left lower leg: No edema.     Comments: Trace right foot edema. ++ B/L Bunion, worse on the left. No tenderness. Toenails looks good.       ASSESSMENT/PLAN:   COPD (chronic obstructive pulmonary disease) (HCC) Now compliant with meds. Sysmptoms improved. Deferring cardiology referral for SOB on exertion since it has improved.  Pulmonology note reviewed. F/U for PFT as planned.  Diabetes mellitus, type II ([place]) Stable. F/U for eye exam as scheduled.   Nail discoloration Fungal culture report discussed with her. Negative for fungal infection. Likely traumatic toenail changes. Her nail is back to normal. Monitor for now.    NB: She and her son requested an handicap sticker.  Son wishes that I mail this to his home address at [place], [id number], since he manages her health care. Form completed after visit and mailed as requested.   Andrena Mews, MD [place]

## 2021-11-24 NOTE — Patient Instructions (Signed)
Pulmonary Function Tests Pulmonary function tests (PFTs) are breathing tests that are used to measure how well your lungs work, find out what is causing your lung problems, and figure out the best treatment for you. You may have PFTs: When you have an illness involving your lungs. To watch for changes in your lung function over time if you have a long-term (chronic) lung disease. If you are an Nature conservation officer. PFTs check the effects of being exposed to chemicals over a long period of time. To check for diseases that affect the lungs, such as asthma or chronic obstructive pulmonary disease (COPD). To check lung function before having surgery or other procedures. To check your lungs, if you smoke. To check if prescribed medicines or treatments are helping your lungs. Your results will be compared with the expected lung function of someone with healthy lungs who is similar to you in several ways. These include age, sex, height, weight, and race or ethnicity. This is done to show how your lung function compares with normal lung function (percent predicted). The percent predicted helps your health care provider to know if your lung function is normal or not. If you have had PFTs done before, your health care provider will compare your current results with past results. This shows if your lung function is better, worse, or the same as before. Tell a health care provider about: Any allergies you have. All medicines you are taking, including inhaler or nebulizer medicines, vitamins, herbs, eye drops, creams, and over-the-counter medicines. Any blood disorders you have. Any surgeries you have had, especially recent surgery of the eye, abdomen, or chest. These can make PFTs difficult or unsafe. Any medical conditions you have, including chest pain or heart problems, tuberculosis, or respiratory infections such as pneumonia, a cold, or the flu. Any fear of being in closed spaces (claustrophobia). Some  of your tests may be in a closed space. What are the risks? Generally, this is a safe procedure. However, problems may occur, including: Feeling light-headed due to fast, deep breathing known as over-breathing or hyperventilation. An asthma attack from deep breathing. What happens before the procedure? Take over-the-counter and prescription medicines only as told by your health care provider. If you take inhaler or nebulizer medicines, ask your health care provider which medicines you should take on the day of your testing. Some inhaler medicines may interfere with PFTs if they are taken shortly before the tests. Follow instructions from your health care provider about eating or drinking restrictions. These may include avoiding eating large meals and avoiding using caffeine before the testing. Do not drink alcohol for up to 4 hours before the test. Do not smoke for up to 4 hours before your procedure. This includes e-cigarettes. Wear comfortable clothing that will not interfere with breathing. Avoid exercise that takes a lot of effort (strenuous exercise) for at least 30 minutes before the testing. What happens during the procedure?  You will be given a soft nose clip to wear. This is done so all of your breaths will go through your mouth instead of your nose. You will be given a germ-free (sterile) mouthpiece. It will be attached to a machine, called a spirometer, that measures your breathing. You will be asked to do various breathing exercises. The exercises will be done by breathing in (inhaling) and breathing out (exhaling). You may be asked to repeat the exercises several times before the testing is complete. It is important to follow the instructions exactly to get accurate  results. Make sure to blow as hard and as fast as you can when you are told to do so. You may be given a medicine called a bronchodilator that makes the small air passages in your lungs larger. This medicine will make it  easier for you to breathe. The tests will then be repeated after the medicine takes effect. You will be watched carefully during the procedure for any problems, such as feeling faint or dizzy, or having trouble breathing. The procedure may vary among health care providers and hospitals. What can I expect after the test procedure? It is up to you to get the results of your procedure. Ask your health care provider, or the department that is doing the procedure, when your results will be ready. After you have received your results, talk with your health care provider about treatment options, if necessary. Follow these instructions at home: Do not use any products that contain nicotine or tobacco, such as cigarettes, e-cigarettes, and chewing tobacco. If you need help quitting, ask your health care provider. Summary Pulmonary function tests (PFTs) are used to measure how well your lungs work, find out what is causing your lung problems, and figure out the best treatment for you. If you have had PFTs done before, your health care provider will compare your current results with past results. This shows if your lung function is better, worse, or the same as before. Wear comfortable clothing that will not interfere with breathing when you are tested. It is up to you to get the results of your procedure. After you have received them, talk with your health care provider about treatment options, if necessary. This information is not intended to replace advice given to you by your health care provider. Make sure you discuss any questions you have with your health care provider. Document Revised: 07/30/2021 Document Reviewed: 03/27/2019 Elsevier Patient Education  [place].

## 2021-11-24 NOTE — Assessment & Plan Note (Signed)
Now compliant with meds. Sysmptoms improved. Deferring cardiology referral for SOB on exertion since it has improved.  Pulmonology note reviewed. F/U for PFT as planned.

## 2021-11-27 ENCOUNTER — Ambulatory Visit: Payer: Self-pay

## 2021-11-27 ENCOUNTER — Ambulatory Visit: Payer: Medicare Other | Admitting: Family Medicine

## 2021-11-28 NOTE — Patient Outreach (Signed)
  Care Coordination   Follow Up Visit Note   11/28/2021 Name: Desiree Parker MRN: [id number] DOB: November 01, 1945  Desiree Parker is a 76 y.o. year old female who sees Desiree Feil, MD for primary care. I spoke with  Desiree Parker by phone today  What matters to the patients health and wellness today?  I need to manage my blood pressure    Goals Addressed               This Visit's Progress     I need to manage my blood pressure (pt-stated)        Care Coordination Interventions: Reviewed medications with patient and discussed importance of compliance Counseled on the importance of exercise goals with target of 150 minutes per week Discussed complications of poorly controlled blood pressure such as heart disease, stroke, circulatory complications, vision complications, kidney impairment, sexual dysfunction Active listening / Reflection utilized  Emotional Support Provided Problem [place] strategies reviewed I spoke with Desiree Parker, who reported feeling well with no headaches, chest pain, or shortness of breath beyond the usual. Her blood sugar was 115, her weight was 203 pounds, and she could only recall her systolic blood pressure at 540, not the diastolic number. I asked her to write down her values. She exercises by going to the center for lunch 2-3 times a week and taking her prescribed medications.        SDOH assessments and interventions completed:  No     Care Coordination Interventions Activated:  Yes  Care Coordination Interventions:  Yes, provided   Follow up plan: Follow up call scheduled for 9/19 at 315 pm    Encounter Outcome:  Pt. Scheduled   [name] RN, BSN, [facility]   Phone: [phone]

## 2021-11-28 NOTE — Patient Instructions (Signed)
Visit Information  Thank you for taking time to visit with me today. Please don't hesitate to contact me if I can be of assistance to you.   Following are the goals we discussed today:   Goals Addressed               This Visit's Progress     I need to manage my blood pressure (pt-stated)        Care Coordination Interventions: Reviewed medications with patient and discussed importance of compliance Counseled on the importance of exercise goals with target of 150 minutes per week Discussed complications of poorly controlled blood pressure such as heart disease, stroke, circulatory complications, vision complications, kidney impairment, sexual dysfunction Active listening / Reflection utilized  Emotional Support Provided Problem [place] strategies reviewed I spoke with Desiree Parker, who reported feeling well with no headaches, chest pain, or shortness of breath beyond the usual. Her blood sugar was 115, her weight was 203 pounds, and she could only recall her systolic blood pressure at 945, not the diastolic number. I asked her to write down her values. She exercises by going to the center for lunch 2-3 times a week and taking her prescribed medications.        Our next appointment is by telephone on 9/19 at 315 pm  The patient verbalized understanding of instructions, educational materials, and care plan provided today    [name] RN, BSN, [facility]   Phone: [phone]

## 2021-12-02 LAB — FUNGUS CULTURE W SMEAR

## 2021-12-23 ENCOUNTER — Ambulatory Visit: Payer: Medicare Other | Admitting: Student

## 2021-12-23 ENCOUNTER — Ambulatory Visit (INDEPENDENT_AMBULATORY_CARE_PROVIDER_SITE_OTHER): Payer: Medicare Other | Admitting: Student

## 2021-12-23 DIAGNOSIS — R0609 Other forms of dyspnea: Secondary | ICD-10-CM

## 2021-12-23 LAB — PULMONARY FUNCTION TEST
DL/VA % pred: 82 %
DL/VA: 3.5 ml/min/mmHg/L
DLCO cor % pred: 67 %
DLCO cor: 10.89 ml/min/mmHg
DLCO unc % pred: 66 %
DLCO unc: 10.61 ml/min/mmHg
FEF 25-75 Post: 2.17 L/sec
FEF 25-75 Pre: 1.85 L/sec
FEF2575-%Change-Post: 17 %
FEF2575-%Pred-Post: 157 %
FEF2575-%Pred-Pre: 133 %
FEV1-%Change-Post: 3 %
FEV1-%Pred-Post: 93 %
FEV1-%Pred-Pre: 90 %
FEV1-Post: 1.55 L
FEV1-Pre: 1.49 L
FEV1FVC-%Change-Post: 1 %
FEV1FVC-%Pred-Pre: 113 %
FEV6-%Change-Post: 3 %
FEV6-%Pred-Post: 84 %
FEV6-%Pred-Pre: 82 %
FEV6-Post: 1.79 L
FEV6-Pre: 1.73 L
FEV6FVC-%Change-Post: 0 %
FEV6FVC-%Pred-Post: 105 %
FEV6FVC-%Pred-Pre: 105 %
FVC-%Change-Post: 2 %
FVC-%Pred-Post: 80 %
FVC-%Pred-Pre: 78 %
FVC-Post: 1.79 L
FVC-Pre: 1.74 L
Post FEV1/FVC ratio: 87 %
Post FEV6/FVC ratio: 100 %
Pre FEV1/FVC ratio: 85 %
Pre FEV6/FVC Ratio: 100 %
RV % pred: 106 %
RV: 2.17 L
TLC % pred: 91 %
TLC: 3.94 L

## 2021-12-23 NOTE — Progress Notes (Signed)
PFT done today. 

## 2021-12-24 ENCOUNTER — Telehealth: Payer: Self-pay

## 2021-12-24 DIAGNOSIS — E119 Type 2 diabetes mellitus without complications: Secondary | ICD-10-CM | POA: Diagnosis not present

## 2021-12-24 DIAGNOSIS — Z961 Presence of intraocular lens: Secondary | ICD-10-CM | POA: Diagnosis not present

## 2021-12-24 LAB — HM DIABETES EYE EXAM

## 2021-12-24 NOTE — Telephone Encounter (Signed)
Patient calls nurse line regarding handicap placard form. She states that they never received in the mail.   She is requesting that new copy be sent to her address. [address] [place] [address]  I have placed new DMV form in your box for completion.   Talbot Grumbling, RN

## 2021-12-29 ENCOUNTER — Ambulatory Visit: Payer: Self-pay

## 2021-12-30 ENCOUNTER — Encounter: Payer: Self-pay | Admitting: Family Medicine

## 2021-12-30 NOTE — Patient Instructions (Signed)
Visit Information  Thank you for taking time to visit with me today. Please don't hesitate to contact me if I can be of assistance to you.   Following are the goals we discussed today:   Goals Addressed               This Visit's Progress     I need to manage my blood pressure (pt-stated)        Care Coordination Interventions: Reviewed medications with patient and discussed importance of compliance Counseled on the importance of exercise goals with target of 150 minutes per week Discussed complications of poorly controlled blood pressure such as heart disease, stroke, circulatory complications, vision complications, kidney impairment, sexual dysfunction Active listening / Reflection utilized  Emotional Support Provided Problem [place] strategies reviewed I spoke with [name], who informed me that she hadn't checked her blood pressure regularly. I emphasized the importance of regular check-ups, and she mentioned that she'd start doing it. She denied experiencing any chest pain or headaches. She also informed me that she's keeping up with her exercise routine, taking her medications, and monitoring her diet. Her weight was 204 pounds, and her blood sugar was 118. .        Our next appointment is by telephone on 10/20 at 1 pm  Please call the care guide team at [phone] if you need to cancel or reschedule your appointment.   If you are experiencing a Mental Health or [place] or need someone to talk to, please call 1-800-273-TALK (toll free, 24 hour hotline)  The patient verbalized understanding of instructions, educational materials, and care plan provided today.   [name] RN, BSN, [facility]   Phone: [phone]

## 2021-12-30 NOTE — Patient Outreach (Signed)
  Care Coordination   Follow Up Visit Note   12/29/21 Name: TRANIKA SCHOLLER MRN: [id number] DOB: [birth year]  NAVNEET SCHMUCK is a 76 y.o. year old female who sees [name], MD for primary care. I spoke with  [name] by phone today.  What matters to the patients health and wellness today?  I have not checked my blood pressure    Goals Addressed               This Visit's Progress     I need to manage my blood pressure (pt-stated)        Care Coordination Interventions: Reviewed medications with patient and discussed importance of compliance Counseled on the importance of exercise goals with target of 150 minutes per week Discussed complications of poorly controlled blood pressure such as heart disease, stroke, circulatory complications, vision complications, kidney impairment, sexual dysfunction Active listening / Reflection utilized  Emotional Support Provided Problem [place] strategies reviewed I spoke with Mrs. Fulco, who informed me that she hadn't checked her blood pressure regularly. I emphasized the importance of regular check-ups, and she mentioned that she'd start doing it. She denied experiencing any chest pain or headaches. She also informed me that she's keeping up with her exercise routine, taking her medications, and monitoring her diet. Her weight was 204 pounds, and her blood sugar was 118. [name]        SDOH assessments and interventions completed:  No     Care Coordination Interventions Activated:  Yes  Care Coordination Interventions:  Yes, provided   Follow up plan: Follow up call scheduled for 10/20 1 pm    Encounter Outcome:  Pt. Visit Completed   [name] RN, BSN, [facility]   Phone: [phone]

## 2021-12-30 NOTE — Telephone Encounter (Signed)
Form completed and placed in the front office mail out box

## 2022-01-13 ENCOUNTER — Other Ambulatory Visit: Payer: Self-pay | Admitting: Family Medicine

## 2022-01-13 ENCOUNTER — Telehealth: Payer: Self-pay

## 2022-01-13 DIAGNOSIS — R269 Unspecified abnormalities of gait and mobility: Secondary | ICD-10-CM

## 2022-01-13 DIAGNOSIS — J449 Chronic obstructive pulmonary disease, unspecified: Secondary | ICD-10-CM

## 2022-01-13 NOTE — Telephone Encounter (Signed)
Community message sent to [employer]. Will await response. 

## 2022-01-13 NOTE — Progress Notes (Signed)
Patient calls nurse line requesting new order for rollator walker. She reports that her current walker is broken on one side.    Please place new order and route back to RN team for processing.    Talbot Grumbling, RN

## 2022-01-13 NOTE — Telephone Encounter (Signed)
Patient calls nurse line requesting new order for rollator walker. She reports that her current walker is broken on one side.   Please place new order and route back to RN team for processing.   Talbot Grumbling, RN

## 2022-01-14 DIAGNOSIS — R269 Unspecified abnormalities of gait and mobility: Secondary | ICD-10-CM | POA: Diagnosis not present

## 2022-01-14 DIAGNOSIS — J449 Chronic obstructive pulmonary disease, unspecified: Secondary | ICD-10-CM | POA: Diagnosis not present

## 2022-01-16 NOTE — Progress Notes (Signed)
Synopsis: Referred for dyspnea by Doreene Eland, MD  Subjective:   PATIENT ID: Micheal Likens: female DOB: 1946/02/10, MRN: [id number]  Chief Complaint  Patient presents with   Follow-up    PFT's done 12/23/21. Breathing is unchanged since the last visit.    76yF with history of smoking 60 py quit 1990, memory issues, HTN referred for dyspnea  She has had DOE for 3 years. Trouble walking to her apt or out to her son's car without getting winded. She is taking breo which she thinks may be helpful, rinses mouth after using it. She has an albuterol rescue inhaler which sometimes helps when she uses it. She snores, is sleepy during the day. No PND. No witnessed apneas.   She has gained 13 lb since her last sleep study.   Doesn't look like she's needed any courses of steroids or ABX for AECOPD/asthma.   Her mother died of lung cancer  She worked as a Engineer, building services' Engineer, production, catering. No MJ, vaping.   Interval HPI: Only mild reduction in diffusing capacity on PFT. Obstruction, BD response may have been masked by breo/spiriva.  Still taking breo/spiriva.   Similar extent of DOE to last visit. No cough. No CP.    Otherwise pertinent review of systems is negative.  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HCC) 02/04/2004   ABI 0.84   CAP (community acquired pneumonia) 10/2015   Cataract    Chest pain 09/11/2015   COPD (chronic obstructive pulmonary disease) (HCC)    Degenerative disk disease 10/2005   Lumbar, anterolithesis, L4-5,5-S1   DEVELOPMENTAL READING DISORDER UNSPECIFIED 07/18/2007   Qualifier: Diagnosis of  By: Sheffield Slider MD, Wayne     Diabetes mellitus without complication [facility])    Estrogen deficiency 09/22/2015   GERD (gastroesophageal reflux disease)    Headache 03/11/2016   Hilar mass    CT Chest 10/2015 IMPRESSION: No demonstrable pulmonary embolus.  There is infiltrate consistent with pneumonia in portions of the right lower  lobe. There is patchy atelectasis in the left lower lobe. There is no appreciable adenopathy. The prominence in the right hilum noted previously is apparently due to vascular prominence as opposed to mass or adenopathy. 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   Noncompliance with medications 09/11/2015   Osteoporosis    TOBACCO USER 03/04/2009   Snuff dipper       Family History  Problem Relation Age of Onset   Diabetes Father    Hypertension Mother    Diabetes Mother    Lung cancer Mother    Hypertension Sister    Asthma Son    Colon polyps Neg Hx    Esophageal cancer Neg Hx    Stomach cancer Neg Hx    Rectal cancer Neg Hx      Past Surgical History:  Procedure Laterality Date   CESAREAN SECTION  1975   CHOLECYSTECTOMY  1975   states gallstones removed   TUBAL LIGATION  1975    Social History   Socioeconomic History   Marital status: Single    Spouse name: Not on file   Number of children: 1   Years of education: 12   Highest education level: Not on file  Occupational History   Not on file  Tobacco Use   Smoking status: Former    Packs/day: 2.00    Years: 27.00  Total pack years: 54.00    Types: Cigarettes    Quit date: 09/24/1988    Years since quitting: 33.3    Passive exposure: Past   Smokeless tobacco: Former    Types: Snuff    Quit date: 07/05/2018  Substance and Sexual Activity   Alcohol use: Yes    Alcohol/week: 2.0 standard drinks of alcohol    Types: 2 Standard drinks or equivalent per week   Drug use: No   Sexual activity: Not Currently    Birth control/protection: Surgical  Other Topics Concern   Not on file  Social History Narrative   Lives alone, retired Hospital doctor   Son, Ayssa Krenz in [place]            Social Determinants of Health   Financial Resource Strain: Not on [employer] Insecurity: Not on file  Transportation Needs: Not on file  Physical Activity: Not  on file  Stress: Not on file  Social Connections: Not on file  Intimate Partner Violence: Not on file     No Known Allergies   Outpatient Medications Prior to Visit  Medication Sig Dispense Refill   acetaminophen (TYLENOL) 325 MG tablet Take 650 mg by mouth every 4 (four) hours as needed for mild pain.     albuterol (VENTOLIN HFA) 108 (90 Base) MCG/ACT inhaler Inhale 2 puffs into the lungs every 6 (six) hours as needed for wheezing or shortness of breath. 25.5 g 6   alendronate (FOSAMAX) 70 MG tablet TAKE 1 TABLET BY MOUTH EVERY 7 DAYS WITH A FULL GLASS OF WATER AND ON AN EMPTY STOMACH 12 tablet 2   amLODipine (NORVASC) 10 MG tablet Take 1 tablet (10 mg total) by mouth daily. 30 tablet 1   aspirin EC 81 MG tablet Take 1 tablet (81 mg total) by mouth daily. Swallow whole. 30 tablet 11   atorvastatin (LIPITOR) 20 MG tablet TAKE 1 TABLET(20 MG) BY MOUTH DAILY AT 6 PM 90 tablet 1   Blood Glucose Monitoring Suppl (ONETOUCH VERIO REFLECT) w/Device KIT 1 Units by Does not apply route 3 (three) times daily. 1 kit 0   Blood Glucose Monitoring Suppl (ONETOUCH VERIO) w/Device KIT To check blood sugar once daily. E11.9 1 kit 0   calcium-vitamin D (OSCAL WITH D) 500-200 MG-UNIT tablet Take 1 tablet by mouth 2 (two) times daily. 60 tablet 4   fluticasone furoate-vilanterol (BREO ELLIPTA) 100-25 MCG/ACT AEPB Inhale 1 puff into the lungs daily. 60 each 1   glucose blood (ONETOUCH VERIO) test strip TO CHECK BLOOD SUGAR ONCE DAILY 100 strip 3   Lancet Devices (ONE TOUCH DELICA LANCING DEV) MISC To check blood sugar once daily. E11.9 1 each 0   Lancets (ONETOUCH DELICA PLUS LANCET33G) MISC TO CHECK BLOOD SUGAR ONCE DAILY 100 each 3   lisinopril (ZESTRIL) 20 MG tablet Take 1 tablet (20 mg total) by mouth daily. 90 tablet 1   metFORMIN (GLUCOPHAGE-XR) 500 MG 24 hr tablet TAKE 1 TABLET(500 MG) BY MOUTH EVERY OTHER DAY 45 tablet 3   naproxen (NAPROSYN) 500 MG tablet TAKE 1 TABLET(500 MG) BY MOUTH TWICE DAILY WITH  A MEAL FOR 21 DAYS 15 tablet 0   omeprazole (PRILOSEC) 20 MG capsule TAKE ONE CAPSULE BY MOUTH EVERY DAY AS NEEDED. PROLONGED USE. MAY CAUSE RENAL IMPAIRMENT 90 capsule 1   tiotropium (SPIRIVA HANDIHALER) 18 MCG inhalation capsule INHALE THE CONTENT OF 1 CAPSULE VIA HANDIHALER BY MOUTH EVERY MORNING 90 capsule 2   methocarbamol (ROBAXIN) 500 MG tablet  Take 1 tablet (500 mg total) by mouth 2 (two) times daily. 20 tablet 0   Facility-Administered Medications Prior to Visit  Medication Dose Route Frequency Provider Last Rate Last Admin   0.9 %  sodium chloride infusion  500 mL Intravenous Once Danis, Starr Lake III, MD       0.9 %  sodium chloride infusion  500 mL Intravenous Once Sherrilyn Rist, MD           Objective:   Physical Exam:  General appearance: 76 y.o., female, NAD, conversant  Eyes: anicteric sclerae; PERRL, tracking appropriately HENT: NCAT; MMM Neck: Trachea midline; no lymphadenopathy, no JVD Lungs: CTAB, no crackles, no wheeze, with normal respiratory effort CV: RRR, no murmur  Abdomen: Soft, non-tender; non-distended, BS present  Extremities: No peripheral edema, warm Skin: Normal turgor and texture; no rash Psych: Appropriate affect Neuro: Alert and oriented to person and place, no focal deficit      Vitals:   01/20/22 1017  BP: (!) 150/72  Pulse: 80  Temp: 97.8 F (36.6 C)  TempSrc: Oral  SpO2: 98%  Weight: 206 lb 6.4 oz (93.6 kg)  Height: 4\' 11"  (1.499 m)    98% on RA BMI Readings from Last 3 Encounters:  01/20/22 41.69 kg/m  11/24/21 41.36 kg/m  10/29/21 41.36 kg/m   Wt Readings from Last 3 Encounters:  01/20/22 206 lb 6.4 oz (93.6 kg)  11/24/21 204 lb 12.8 oz (92.9 kg)  10/29/21 204 lb 12.8 oz (92.9 kg)     CBC    Component Value Date/Time   WBC 8.7 10/28/2021 1542   RBC 4.17 10/28/2021 1542   HGB 12.6 10/28/2021 1542   HGB 12.7 03/07/2019 0939   HCT 37.5 10/28/2021 1542   HCT 37.1 03/07/2019 0939   PLT 269.0 10/28/2021 1542    PLT 257 03/07/2019 0939   MCV 90.1 10/28/2021 1542   MCV 88 03/07/2019 0939   MCH 30.2 03/07/2019 0939   MCH 29.3 10/29/2015 0756   MCHC 33.5 10/28/2021 1542   RDW 13.6 10/28/2021 1542   RDW 12.7 03/07/2019 0939   LYMPHSABS 3.2 10/28/2021 1542   MONOABS 0.7 10/28/2021 1542   EOSABS 0.2 10/28/2021 1542   BASOSABS 0.1 10/28/2021 1542      Chest Imaging: CT Chest 07/10/21 reviewed by me unremarkable  CXR 10/28/21 reviewed by me unchanged  Pulmonary Functions Testing Results:    Latest Ref Rng & Units 12/23/2021   12:58 PM 09/04/2019   10:46 AM  PFT Results  FVC-Pre L 1.74  P 1.97  P  FVC-Predicted Pre % 78  P 111  P  FVC-Post L 1.79  P 1.99  P  FVC-Predicted Post % 80  P 112  P  Pre FEV1/FVC % % 85  P 85  P  Post FEV1/FCV % % 87  P 88  P  FEV1-Pre L 1.49  P 1.67  P  FEV1-Predicted Pre % 90  P 123  P  FEV1-Post L 1.55  P 1.75  P  DLCO uncorrected ml/min/mmHg 10.61  P 12.56  P  DLCO UNC% % 66  P 77  P  DLCO corrected ml/min/mmHg 10.89  P 12.56  P  DLCO COR %Predicted % 67  P 77  P  DLVA Predicted % 82  P 94  P  TLC L 3.94  P   TLC % Predicted % 91  P   RV % Predicted % 106  P     P  Preliminary result    PFT 12/23/21 reviewed by me with mildly reduced diffusing capacity  PFT 2021 with Mild restriction, low erv, borderline normal DLCO  PSG 08/2019: IMPRESSIONS - Mild obstructive sleep apnea occurred during this study (AHI = 8.8/h). - No significant central sleep apnea occurred during this study (CAI = 0.0/h). - Oxygen desaturation was noted during this study (Min O2 = 86.00%). Mean sat 94.89%. - Time with O2 saturation 88% or less was 0.9 minutes. - The patient snored with soft snoring volume. - EKG findings include PVCs. - Clinically significant periodic limb movements did not occur during sleep. No significant associated arousals.  Echocardiogram:   TTE 07/02/21:  1. Left ventricular ejection fraction, by estimation, is 55 to 60%. The  left ventricle has  normal function. The left ventricle has no regional  wall motion abnormalities. Left ventricular diastolic parameters were  normal.   2. Right ventricular systolic function is normal. The right ventricular  size is normal. Tricuspid regurgitation signal is inadequate for assessing  PA pressure.   3. The mitral valve is grossly normal. Trivial mitral valve  regurgitation. No evidence of mitral stenosis.   4. The aortic valve is grossly normal. Aortic valve regurgitation is not  visualized. No aortic stenosis is present.   Nuke stress 07/2019:  The left ventricular ejection fraction is hyperdynamic (>65%). Nuclear stress EF: 75%. There was no ST segment deviation noted during stress. The study is normal. This is a low risk study.    Assessment & Plan:   # DOE  May be multifactorial and unclear to what extent deconditioning, obesity, untreated OSA playing roles. Still don't see evidence of asthma/COPD despite her smoking history but she remained on breo/spiriva prior to most recent PFT.  Plan: - STOP breo 3 weeks before next breathing tests (pre/post-BD spiro only). STOP spiriva 3 days before next breathing tests. Can continue to use albuterol as needed except for the morning of your breathing tests. - we will schedule sleep study at [facility] - if these aren't revealing for chief cause of DOE, may need to reestablish with cardiology - see you in 3 months or sooner if need be!   RTC 6 weeks with PFT   Omar Person, MD [facility] 01/20/2022 10:22 AM

## 2022-01-20 ENCOUNTER — Encounter: Payer: Self-pay | Admitting: Student

## 2022-01-20 ENCOUNTER — Ambulatory Visit (INDEPENDENT_AMBULATORY_CARE_PROVIDER_SITE_OTHER): Payer: Medicare Other | Admitting: Student

## 2022-01-20 VITALS — BP 150/72 | HR 80 | Temp 97.8°F | Ht 59.0 in | Wt 206.4 lb

## 2022-01-20 DIAGNOSIS — R0609 Other forms of dyspnea: Secondary | ICD-10-CM | POA: Diagnosis not present

## 2022-01-20 NOTE — Patient Instructions (Signed)
-   STOP breo 3 weeks before next breathing tests. STOP spiriva 3 days before next breathing tests. Can continue to use albuterol as needed except for the morning of your breathing tests. - we will schedule sleep study at [facility] - see you in 3 months or sooner if need be!

## 2022-01-21 ENCOUNTER — Other Ambulatory Visit: Payer: Self-pay | Admitting: Family Medicine

## 2022-01-21 DIAGNOSIS — K219 Gastro-esophageal reflux disease without esophagitis: Secondary | ICD-10-CM

## 2022-01-25 ENCOUNTER — Ambulatory Visit (HOSPITAL_BASED_OUTPATIENT_CLINIC_OR_DEPARTMENT_OTHER): Payer: Medicare Other | Attending: Student | Admitting: Pulmonary Disease

## 2022-01-25 DIAGNOSIS — G4733 Obstructive sleep apnea (adult) (pediatric): Secondary | ICD-10-CM | POA: Insufficient documentation

## 2022-01-25 DIAGNOSIS — E669 Obesity, unspecified: Secondary | ICD-10-CM | POA: Insufficient documentation

## 2022-01-25 DIAGNOSIS — Z6841 Body Mass Index (BMI) 40.0 and over, adult: Secondary | ICD-10-CM | POA: Diagnosis not present

## 2022-01-25 DIAGNOSIS — R0683 Snoring: Secondary | ICD-10-CM | POA: Diagnosis not present

## 2022-01-25 DIAGNOSIS — E119 Type 2 diabetes mellitus without complications: Secondary | ICD-10-CM | POA: Insufficient documentation

## 2022-01-25 DIAGNOSIS — J449 Chronic obstructive pulmonary disease, unspecified: Secondary | ICD-10-CM | POA: Insufficient documentation

## 2022-01-25 DIAGNOSIS — I10 Essential (primary) hypertension: Secondary | ICD-10-CM | POA: Insufficient documentation

## 2022-01-25 DIAGNOSIS — R0609 Other forms of dyspnea: Secondary | ICD-10-CM | POA: Diagnosis not present

## 2022-01-26 ENCOUNTER — Ambulatory Visit: Payer: Self-pay

## 2022-01-26 NOTE — Patient Instructions (Signed)
Visit Information  Thank you for taking time to visit with me today. Please don't hesitate to contact me if I can be of assistance to you.   Following are the goals we discussed today:   Goals Addressed               This Visit's Progress     I need to manage my blood pressure (pt-stated)        Care Coordination Interventions: Reviewed medications with patient and discussed importance of compliance Counseled on the importance of exercise goals with target of 150 minutes per week Discussed complications of poorly controlled blood pressure such as heart disease, stroke, circulatory complications, vision complications, kidney impairment, sexual dysfunction Active listening / Reflection utilized  Emotional Support Provided Problem [place] strategies reviewed Mrs. Halls is showing improvement in monitoring her blood pressure. She checked her blood pressure today, and the reading was 128/68. She reported no chest pain or headaches. We reviewed the protocol together, and she confirmed that she understood it well. Mrs. Dearman also informed me that she underwent a sleep study last night, and it went smoothly. She is now waiting for the results. I have also reminded her of the seasonal change and advised her to take necessary COVID precautions.        Our next appointment is by telephone on 11/17 at 115 pm  Please call the care guide team at [phone] if you need to cancel or reschedule your appointment.   If you are experiencing a Mental Health or [place] or need someone to talk to, please call 1-800-273-TALK (toll free, 24 hour hotline)  The patient verbalized understanding of instructions, educational materials, and care plan provided today.  [name] RN, BSN, [facility]   Phone: [phone]

## 2022-01-26 NOTE — Patient Outreach (Signed)
  Care Coordination   Follow Up Visit Note   01/26/2022 Name: Desiree [place] MRN: [id number] DOB: 01/18/1946  Desiree [place] is a 76 y.o. year old female who sees [name], MD for primary care. I spoke with  Desiree [place] by phone today.  What matters to the patients health and wellness today?  I am checking my blood pressure    Goals Addressed               This Visit's Progress     I need to manage my blood pressure (pt-stated)        Care Coordination Interventions: Reviewed medications with patient and discussed importance of compliance Counseled on the importance of exercise goals with target of 150 minutes per week Discussed complications of poorly controlled blood pressure such as heart disease, stroke, circulatory complications, vision complications, kidney impairment, sexual dysfunction Active listening / Reflection utilized  Emotional Support Provided Problem [place] strategies reviewed Desiree [place] is showing improvement in monitoring her blood pressure. She checked her blood pressure today, and the reading was 128/68. She reported no chest pain or headaches. We reviewed the protocol together, and she confirmed that she understood it well. Desiree [place] also informed me that she underwent a sleep study last night, and it went smoothly. She is now waiting for the results. I have also reminded her of the seasonal change and advised her to take necessary COVID precautions.        SDOH assessments and interventions completed:  No     Care Coordination Interventions Activated:  Yes  Care Coordination Interventions:  Yes, provided   Follow up plan: Follow up call scheduled for 11/17 115 pm    Encounter Outcome:  Pt. Visit Completed [place] RN, BSN, [facility]   Phone: [phone]

## 2022-01-28 DIAGNOSIS — G4733 Obstructive sleep apnea (adult) (pediatric): Secondary | ICD-10-CM

## 2022-01-28 NOTE — Procedures (Signed)
Patient Name: Desiree [place], Desiree [place] Date: 01/25/2022 Gender: Female D.O.B: Feb 13, 1946 Age (years): 79 Referring Provider: Maryjane Hurter Height (inches): 22 Interpreting Physician: Kara Mead MD, ABSM Weight (lbs): 205 RPSGT: Laren Everts BMI: 41 MRN: [id number] Neck Size: 16.50 <br> <br> CLINICAL INFORMATION Sleep Study Type: NPSG    Indication for sleep study: COPD, Diabetes, Hypertension, Obesity, Snoring    Epworth Sleepiness Score: 8     Most recent polysomnogram dated 09/03/2019 revealed an AHI of 8.8/h and RDI of 10.3/h. SLEEP STUDY TECHNIQUE As per the AASM Manual for the Scoring of Sleep and Associated Events v2.3 (April 2016) with a hypopnea requiring 4% desaturations.  The channels recorded and monitored were frontal, central and occipital EEG, electrooculogram (EOG), submentalis EMG (chin), nasal and oral airflow, thoracic and abdominal wall motion, anterior tibialis EMG, snore microphone, electrocardiogram, and pulse oximetry.  MEDICATIONS Medications self-administered by patient taken the night of the study : N/A  SLEEP ARCHITECTURE The study was initiated at 10:57:03 PM and ended at 5:16:23 AM.  Sleep onset time was 7.3 minutes and the sleep efficiency was 82.1%%. The total sleep time was 311.5 minutes.  Stage REM latency was 110.0 minutes.  The patient spent 7.5%% of the night in stage N1 sleep, 72.9%% in stage N2 sleep, 0.0%% in stage N3 and 19.6% in REM.  Alpha intrusion was absent.  Supine sleep was 3.05%.  RESPIRATORY PARAMETERS The overall apnea/hypopnea index (AHI) was 16.6 per hour. There were 44 total apneas, including 43 obstructive, 1 central and 0 mixed apneas. There were 42 hypopneas and 34 RERAs.  The AHI during Stage REM sleep was 62.0 per hour.  AHI while supine was 0.0 per hour.  The mean oxygen saturation was 93.3%. The minimum SpO2 during sleep was 83.0%.  moderate snoring was noted during this study.  CARDIAC DATA The  2 lead EKG demonstrated sinus rhythm. The mean heart rate was 75.7 beats per minute. Other EKG findings include: None.   LEG MOVEMENT DATA The total PLMS were 0 with a resulting PLMS index of 0.0. Associated arousal with leg movement index was 0.0 .  IMPRESSIONS - Moderate obstructive sleep apnea occurred during this study (AHI = 16.6/h). Events were predominantly noted during REM. Supine sleep was minimal. - No significant central sleep apnea occurred during this study (CAI = 0.2/h). - Mild oxygen desaturation was noted during this study (Min O2 = 83.0%). - The patient snored with moderate snoring volume. - No cardiac abnormalities were noted during this study. - Clinically significant periodic limb movements did not occur during sleep. No significant associated arousals.   DIAGNOSIS - Obstructive Sleep Apnea (G47.33)   RECOMMENDATIONS - Treatment options include CPAP or dental appliance. Trial of autoCPAP may be recommended. - Avoid alcohol, sedatives and other CNS depressants that may worsen sleep apnea and disrupt normal sleep architecture. - Sleep hygiene should be reviewed to assess factors that may improve sleep quality. - Weight management and regular exercise should be initiated or continued if appropriate.    Kara Mead MD Board Certified in [place]

## 2022-02-01 ENCOUNTER — Telehealth: Payer: Self-pay | Admitting: *Deleted

## 2022-02-01 NOTE — Chronic Care Management (AMB) (Signed)
  Care Coordination Note  02/01/2022 Name: Desiree [place] MRN: [id number] DOB: [birth year]  Desiree [place] is a 76 y.o. year old female who is a primary care patient of [name], MD and is actively engaged with the care management team. I reached out to [name] by phone today to assist with re-scheduling a follow up visit with the RN Case Manager  Follow up plan: Unsuccessful telephone outreach attempt made. A HIPAA compliant phone message was left for the patient providing contact information and requesting a return call.   [place]  Direct Dial: [phone]

## 2022-02-02 NOTE — Chronic Care Management (AMB) (Signed)
  Care Coordination Note  02/02/2022 Name: Desiree [place] MRN: [id number] DOB: January 17, 1946  Desiree [place] is a 76 y.o. year old female who is a primary care patient of [name], MD and is actively engaged with the care management team. I reached out to [name] by phone today to assist with re-scheduling a follow up visit with the RN Case Manager  Follow up plan: Telephone appointment with care management team member scheduled for:03/02/22    [place]: [phone]

## 2022-02-19 ENCOUNTER — Encounter (HOSPITAL_BASED_OUTPATIENT_CLINIC_OR_DEPARTMENT_OTHER): Payer: Medicare Other | Admitting: Pulmonary Disease

## 2022-02-20 ENCOUNTER — Other Ambulatory Visit: Payer: Self-pay | Admitting: Family Medicine

## 2022-02-22 ENCOUNTER — Encounter: Payer: Self-pay | Admitting: Family Medicine

## 2022-02-22 NOTE — Patient Instructions (Signed)
   [facility] Diagnosis and Treatment of Childhood Mood Disorders, ADHD, Autism, and Developmental Delay  [address], [address] [place],  [place]  [address] Get Driving Directions Main: [phone]  Assessments for ADHD and Therapy for Children  [facility]: [phone] [facility] [address], [place], [place] [address]  [phone]  [phone]  [facility]- [facility] In [facility] for Mental Health Disorders  This also provides regular therapy at low cost for children Therapists speak Spanish and English  [address], [place], [place] [address] Monday - Friday: 8:30am-12:00pm / 1:00pm-2:30pm  

## 2022-02-22 NOTE — Progress Notes (Unsigned)
Subjective:   Desiree Parker is a 76 y.o. female who presents for an Initial Medicare Annual Wellness Visit. *** CC: Right foot pain (Heel)x for a while, no swelling. Desiree Parker makes it worse    Opth:  2 months ago.  Dental appoint Review of Systems    ***       Objective:    There were no vitals filed for this visit. There is no height or weight on file to calculate BMI.     01/25/2022    8:39 PM 11/24/2021    8:56 AM 10/23/2021    8:33 AM 07/14/2021    9:44 AM 06/30/2021    9:39 AM 06/14/2021    4:59 PM 04/16/2020    9:47 AM  Advanced Directives  Does Patient Have a Medical Advance Directive? Yes _0  No  Type of Advance Directive Healthcare Power of Attorney        Does patient want to make changes to medical advance directive? Yes (Desiree Parker/Desiree Parker/Desiree Parker - Information given)        Copy of Desiree Parker in Chart? No - copy requested        Would patient like information on creating a medical advance directive?  No - Patient declined No - Patient declined    No - Patient declined    Current Medications (verified) Outpatient Encounter Medications as of 02/23/2022  Medication Sig   acetaminophen (TYLENOL) 325 MG tablet Take 650 mg by mouth every 4 (four) hours as needed for mild pain.   albuterol (VENTOLIN HFA) 108 (90 Base) MCG/ACT inhaler Inhale 2 puffs into the lungs every 6 (six) hours as needed for wheezing or shortness of breath.   alendronate (FOSAMAX) 70 MG tablet TAKE 1 TABLET BY MOUTH EVERY 7 DAYS WITH A FULL GLASS OF WATER AND ON AN EMPTY STOMACH   amLODipine (NORVASC) 10 MG tablet Take 1 tablet (10 mg total) by mouth daily.   aspirin EC 81 MG tablet Take 1 tablet (81 mg total) by mouth daily. Swallow whole.   atorvastatin (LIPITOR) 20 MG tablet TAKE 1 TABLET(20 MG) BY MOUTH DAILY AT 6 PM   Blood Glucose Monitoring Suppl (ONETOUCH VERIO REFLECT) w/Device KIT 1 Units by Does not apply route 3 (three) times daily.   Blood Glucose  Monitoring Suppl (ONETOUCH VERIO) w/Device KIT To check blood sugar once daily. E11.9   calcium-vitamin D (OSCAL WITH D) 500-200 MG-UNIT tablet Take 1 tablet by mouth 2 (two) times daily.   fluticasone furoate-vilanterol (BREO ELLIPTA) 100-25 MCG/ACT AEPB Inhale 1 puff into the lungs daily.   glucose blood (ONETOUCH VERIO) test strip TO CHECK BLOOD SUGAR ONCE DAILY   Lancet Devices (ONE TOUCH DELICA LANCING DEV) MISC To check blood sugar once daily. E11.9   Lancets (ONETOUCH DELICA PLUS [id number]) MISC TO CHECK BLOOD SUGAR ONCE DAILY   lisinopril (ZESTRIL) 20 MG tablet Take 1 tablet (20 mg total) by mouth daily.   metFORMIN (GLUCOPHAGE-XR) 500 MG 24 hr tablet TAKE 1 TABLET(500 MG) BY MOUTH EVERY OTHER DAY   naproxen (NAPROSYN) 500 MG tablet TAKE 1 TABLET(500 MG) BY MOUTH TWICE DAILY WITH A MEAL FOR 21 DAYS   omeprazole (PRILOSEC) 20 MG capsule TAKE ONE CAPSULE BY MOUTH EVERY DAY AS NEEDED. PROLONGED USE. MAY CAUSE RENAL IMPAIRMENT   tiotropium (SPIRIVA HANDIHALER) 18 MCG inhalation capsule INHALE THE CONTENT OF 1 CAPSULE VIA HANDIHALER BY MOUTH EVERY MORNING   Facility-Administered Encounter Medications as of 02/23/2022  Medication  0.9 %  sodium chloride infusion   0.9 %  sodium chloride infusion    Allergies (verified) Patient has no known allergies.   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)    Degenerative disk disease 10/2005   Lumbar, anterolithesis, L4-5,5-S1   DEVELOPMENTAL READING DISORDER UNSPECIFIED 07/18/2007   Qualifier: Diagnosis of  By: Desiree Kehr MD, [place]     Diabetes mellitus without complication ([place])    Estrogen deficiency 09/22/2015   GERD (gastroesophageal reflux disease)    Headache 03/11/2016   Hilar mass    CT Chest 10/2015 IMPRESSION: No demonstrable  pulmonary embolus.  There is infiltrate consistent with pneumonia in portions of the right lower lobe. There is patchy atelectasis in the left lower lobe. There is no appreciable adenopathy. The prominence in the right hilum noted previously is apparently due to vascular prominence as opposed to mass or adenopathy. 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   Noncompliance with medications 09/11/2015   Osteoporosis    TOBACCO USER 03/04/2009   Snuff dipper     Past Surgical History:  Procedure Laterality Date   [place]   states gallstones removed   TUBAL LIGATION  1975   Family History  Problem Relation Age of Onset   Diabetes Father    Hypertension Mother    Diabetes Mother    Lung cancer Mother    Hypertension Sister    Asthma Son    Colon polyps Neg Hx    Esophageal cancer Neg Hx    Stomach cancer Neg Hx    Rectal cancer Neg Hx    Social History   Socioeconomic History   Marital status: Single    Spouse name: Not on file   Number of children: 1   Years of education: 12   Highest education level: Not on file  Occupational History   Not on file  Tobacco Use   Smoking status: Former    Packs/day: 2.00    Years: 27.00    Total pack years: 54.00    Types: Cigarettes    Quit date: 09/24/1988    Years since quitting: 33.4    Passive exposure: Past   Smokeless tobacco: Former    Types: Snuff    Quit date: 07/05/2018  Substance and Sexual Activity   Alcohol use: Yes    Alcohol/week: 2.0 standard drinks of alcohol    Types: 2 Standard drinks or equivalent per week   Drug use: No   Sexual activity: Not Currently    Birth control/protection: Surgical  Other Topics Concern   Not on file  Social History Narrative   Lives alone, retired Geophysicist/field seismologist   Son, Desiree Parker in [place] Strain: [place]   (02/22/2022)   Overall Financial Resource Strain (CARDIA)    Difficulty of Paying Living Expenses: Not very hard  Food Insecurity: No Food Insecurity (02/22/2022)   Hunger Vital Sign    Worried About Running Out of Food in the Last Year: Never true    Ran Out of Food in the Last Year: Never  true  Transportation Needs: No Transportation Needs (02/22/2022)   PRAPARE - Hydrologist (Medical): No    Lack of Transportation (Non-Medical): No  Physical Activity: Inactive (02/22/2022)   Exercise Vital Sign    Days of Exercise per Week: 0 days    Minutes of Exercise per Session: 0 min  Stress: No Stress Concern Present (02/22/2022)   [place]    Feeling of Stress : Not at all  Social Connections: Not on file    Tobacco Counseling Counseling given: Not Answered   Clinical Intake:           Nutritional Status: BMI > 30  Obese Nutritional Risks: None     Diabetic?***         Activities of Daily Living    02/22/2022    4:15 PM  In your present state of health, do you have any difficulty performing the following activities:  Hearing? 0  Vision? 0  Comment She wears eyeglasses  Difficulty concentrating or making decisions? 0  Walking or climbing stairs? 1  Comment She uses Desiree  Dressing or bathing? 0  Doing errands, shopping? 1  Comment Son does shopping and takes her to doctor's appointment    Patient Care Team: [name], MD as PCP - General (Family Medicine) [name], RN as Case Manager  Indicate any recent Medical Services you may have received from other than [facility] providers in the past year (date may be approximate).     Assessment:   This is a routine wellness examination for Desiree Parker.  Hearing/Vision screen No results found.  Dietary issues and exercise activities discussed: Exercise limited by: orthopedic condition(s)   Goals Addressed              This Visit's Progress    Blood Pressure < 150/90       BP goal < 140/90 She is trying to keep it under control     COMPLETED: [place] (see longitudinal plan of care for additional care plan information) Current Barriers & progress:   Patient needs Support, Education, and Care Coordination to resolve unmet Personal Care needs  PCS Referral completed and signed by PCP Patient is not eligible for PCS Clinical Social Work Goal(s):  Over the next 30 to 45 days, patient will have personal care needs met as evident by son exploring other options to meet patient's PCS needs. Interventions provided by LCSW : [employer], informed patient does not have full Medicaid and does not qualify for [facility] Spoke with patient's son to provide and update and provide additional alternative options to PCS       ( [facility], [employer]) Son understands these options have wait list and will require that he calls to start the process  Patient Self Care Activities & Deficits:  Patient is unable to perform ADLs independently without assistance  Family/support system will assist patient with meeting needs until PCS is approved Son will call resources provided  Please see past updates related to this goal by clicking on the "Past Updates" button in the selected goal         Depression Screen    02/22/2022    4:13 PM 11/24/2021    9:17 AM 10/23/2021    8:33 AM 07/14/2021    9:57 AM 06/30/2021    9:39 AM  05/13/2020   10:52 AM 04/16/2020    9:47 AM  PHQ 2/9 Scores  PHQ - 2 Score 0 0 0 1 2 0 0  PHQ- 9 Score 1 0 _0 0 0    Fall Risk    11/24/2021    8:55 AM 10/23/2021    8:33 AM 07/14/2021    9:42 AM 06/30/2021    9:39 AM 04/16/2020    9:47 AM  Fall Risk   Falls in the past year? 0 0 0 0 0  Number falls in past yr: 0 0 0 0 0  Injury with Fall? 0 0 0 0 0    FALL RISK PREVENTION PERTAINING TO THE  HOME:  Any stairs in or around the home? No  If so, are there any without handrails? {YES/NO:21197} Home free of loose throw rugs in walkways, pet beds, electrical cords, etc? No  Adequate lighting in your home to reduce risk of falls? {YES/NO:21197}  ASSISTIVE DEVICES UTILIZED TO PREVENT FALLS:  Life alert? No  Use of a cane, Desiree or w/c? Yes  Grab bars in the bathroom? {YES/NO:21197} Shower chair or bench in shower? {YES/NO:21197} Elevated toilet seat or a handicapped toilet? {YES/NO:21197}  TIMED UP AND GO:  Was the test performed? {YES/NO:21197}.  Length of time to ambulate 10 feet: *** sec.   {Appearance of ZRAQ:7622633}  Cognitive Function:        Immunizations Immunization History  Administered Date(s) Administered   Influenza Whole 02/21/2007, 02/13/2008, 01/28/2009, 01/13/2010   Influenza, High Dose Seasonal PF 12/27/2016, 01/23/2018   Influenza,inj,Quad PF,6+ Mos 01/30/2016   Influenza-Unspecified 01/10/2017, 01/18/2018, 02/14/2019, 01/24/2020   Moderna SARS-COV2 Booster Vaccination 05/02/2021   PFIZER Comirnaty(Gray Top)Covid-19 Tri-Sucrose Vaccine 10/07/2020   PFIZER(Purple Top)SARS-COV-2 Vaccination 05/05/2019, 05/26/2019, 02/26/2020   Pneumococcal Conjugate-13 10/03/2015   Pneumococcal Polysaccharide-23 07/18/2007, 08/01/2012   Td 04/12/2005   Tdap 01/30/2016   Zoster Recombinat (Shingrix) 05/05/2013, 03/07/2019    {TDAP status:2101805}  {Flu Vaccine status:2101806}  {Pneumococcal vaccine status:2101807}  {Covid-19 vaccine status:2101808}  Qualifies for Shingles Vaccine? {YES/NO:21197}  Zostavax completed {YES/NO:21197}  {Shingrix Completed?:2101804}  Screening Tests Health Maintenance  Topic Date Due   Medicare Annual Wellness (AWV)  Never done   INFLUENZA VACCINE  11/10/2021   COVID-19 Vaccine (5 - Pfizer series) 11/05/2022 (Originally 06/27/2021)   HEMOGLOBIN A1C  04/25/2022   FOOT EXAM  07/01/2022   Diabetic kidney evaluation - Urine  ACR  10/24/2022   Diabetic kidney evaluation - GFR measurement  10/29/2022   OPHTHALMOLOGY EXAM  12/25/2022   TETANUS/TDAP  01/29/2026   Pneumonia Vaccine 20+ Years old  Completed   DEXA SCAN  Completed   Hepatitis C Screening  Completed   Zoster Vaccines- Shingrix  Completed   HPV VACCINES  Aged Out   COLONOSCOPY (Pts 45-3yr Insurance coverage will need to be confirmed)  Discontinued    Health Maintenance  Health Maintenance Due  Topic Date Due   Medicare Annual Wellness (AWV)  Never done   INFLUENZA VACCINE  11/10/2021    {Colorectal cancer screening:2101809}  {Mammogram status:21018020}  {Bone Density status:21018021}  Lung Cancer Screening: (Low Dose CT Chest recommended if Age 71-80 years, 30 pack-year currently smoking OR have quit w/in 15years.) {DOES NOT does:27190::"does not"} qualify.   Lung Cancer Screening Referral: ***  Additional Screening:  Hepatitis C Screening: {DOES NOT does:27190::"does not"} qualify; Completed ***  Vision Screening: Recommended annual ophthalmology exams for early detection of glaucoma and other disorders of the eye. Is the patient up  to date with their annual eye exam?  {YES/NO:21197} Who is the provider or what is the name of the office in which the patient attends annual eye exams? *** If pt is not established with a provider, would they like to be referred to a provider to establish care? {YES/NO:21197}.   Dental Screening: Recommended annual dental exams for proper oral hygiene  Community Resource Referral / Chronic Care Management: CRR required this visit?  {YES/NO:21197}  CCM required this visit?  {YES/NO:21197}     Plan:     I have personally reviewed and noted the following in the patient's chart:   Medical and social history Use of alcohol, tobacco or illicit drugs  Current medications and supplements including opioid prescriptions. {Opioid Prescriptions:[phone]} Functional ability and status Nutritional  status Physical activity Advanced directives List of other physicians Hospitalizations, surgeries, and ER visits in previous 12 months Vitals Screenings to include cognitive, depression, and falls Referrals and appointments  In addition, I have reviewed and discussed with patient certain preventive protocols, quality metrics, and best practice recommendations. A written personalized care plan for preventive services as well as general preventive health recommendations were provided to patient.     Andrena Mews, MD   02/22/2022   Nurse Notes: ***

## 2022-02-23 ENCOUNTER — Ambulatory Visit (INDEPENDENT_AMBULATORY_CARE_PROVIDER_SITE_OTHER): Payer: Medicare Other | Admitting: Family Medicine

## 2022-02-23 ENCOUNTER — Encounter: Payer: Self-pay | Admitting: Family Medicine

## 2022-02-23 VITALS — BP 127/63 | HR 78 | Ht 59.0 in | Wt 205.5 lb

## 2022-02-23 DIAGNOSIS — M79671 Pain in right foot: Secondary | ICD-10-CM

## 2022-02-23 DIAGNOSIS — Z0000 Encounter for general adult medical examination without abnormal findings: Secondary | ICD-10-CM | POA: Diagnosis not present

## 2022-02-24 ENCOUNTER — Ambulatory Visit (HOSPITAL_COMMUNITY)
Admission: RE | Admit: 2022-02-24 | Discharge: 2022-02-24 | Disposition: A | Discharge status: Home / Self Care | Payer: Medicare Other | Source: Ambulatory Visit | Attending: Family Medicine | Admitting: Family Medicine

## 2022-02-24 DIAGNOSIS — M79671 Pain in right foot: Secondary | ICD-10-CM | POA: Insufficient documentation

## 2022-02-24 DIAGNOSIS — R600 Localized edema: Secondary | ICD-10-CM | POA: Diagnosis not present

## 2022-02-26 ENCOUNTER — Telehealth: Payer: Self-pay | Admitting: Family Medicine

## 2022-02-26 DIAGNOSIS — M19071 Primary osteoarthritis, right ankle and foot: Secondary | ICD-10-CM

## 2022-02-26 NOTE — Telephone Encounter (Signed)
Foot xray report discussed. Agreed with referral to [facility] specialist.  DG Foot Complete Right  Result Date: 02/26/2022 CLINICAL DATA:  Heel pain for 3 weeks, no reported injury EXAM: RIGHT FOOT COMPLETE - 3+ VIEW COMPARISON:  None Available. FINDINGS: No fracture or dislocation of the right foot. Bunion deformity of the great toe with moderate associated first metatarsophalangeal arthrosis. Mild midfoot arthrosis. Diffuse soft tissue edema about the foot. Vascular calcinosis. IMPRESSION: 1. No fracture or dislocation of the right foot. 2. Bunion deformity of the great toe with moderate associated first metatarsophalangeal arthrosis. 3. Mild midfoot arthrosis. 4. Diffuse soft tissue edema about the foot. Electronically Signed   By: Delanna Ahmadi M.D.   On: 02/26/2022 11:38

## 2022-03-02 ENCOUNTER — Ambulatory Visit: Payer: Self-pay

## 2022-03-02 NOTE — Patient Instructions (Signed)
Visit Information  Thank you for taking time to visit with me today. Please don't hesitate to contact me if I can be of assistance to you.   Following are the goals we discussed today:   Goals Addressed               This Visit's Progress     I need to manage my blood pressure (pt-stated)        Care Coordination Interventions: Reviewed medications with patient and discussed importance of compliance Counseled on the importance of exercise goals with target of 150 minutes per week Discussed complications of poorly controlled blood pressure such as heart disease, stroke, circulatory complications, vision complications, kidney impairment, sexual dysfunction Active listening / Reflection utilized  Emotional Support Provided Problem [place] strategies reviewed Discussed the importance of checking blood pressure        Our next appointment is by telephone on 12/19 at 130 pm  Please call the care guide team at [phone] if you need to cancel or reschedule your appointment.   If you are experiencing a Mental Health or [place] or need someone to talk to, please call 1-800-273-TALK (toll free, 24 hour hotline)  The patient verbalized understanding of instructions, educational materials, and care plan provided today.    [name] RN, BSN, [facility]   Phone: [phone]

## 2022-03-02 NOTE — Patient Outreach (Signed)
  Care Coordination   Follow Up Visit Note   03/02/2022 Name: Desiree Parker MRN: [id number] DOB: 1946-03-28  Desiree Parker is a 76 y.o. year old female who sees [name], MD for primary care. I spoke with  Desiree Parker by phone today.  What matters to the patients health and wellness today?  It has been observed that Desiree Parker has not been regularly monitoring her blood pressure. The last time she checked was last Thursday, and it was at a level of 126/68. I recommended her to check her blood pressure at least three times a week and keep a record of the values. She agreed to follow the advice. She also confirmed that she is taking her medications as prescribed and watching her diet.  Desiree Parker has denied experiencing any symptoms of headache, chest pain, or swelling. However, she mentioned that she is having pain in the heel of her right foot. She has already undergone an x-ray, and the results have been communicated to [place].  We discussed the importance of fall precautions and using her walker and cane while walking.    Goals Addressed               This Visit's Progress     I need to manage my blood pressure (pt-stated)        Care Coordination Interventions: Reviewed medications with patient and discussed importance of compliance Counseled on the importance of exercise goals with target of 150 minutes per week Discussed complications of poorly controlled blood pressure such as heart disease, stroke, circulatory complications, vision complications, kidney impairment, sexual dysfunction Active listening / Reflection utilized  Emotional Support Provided Problem [place] strategies reviewed Discussed the importance of checking blood pressure        SDOH assessments and interventions completed:  No     Care Coordination Interventions Activated:  Yes  Care Coordination Interventions:  Yes, provided   Follow up plan: Follow up call scheduled for 12/19 130  pm    Encounter Outcome:  Pt. Visit Completed   [name] RN, BSN, [facility]   Phone: [phone]

## 2022-03-11 ENCOUNTER — Encounter: Payer: Self-pay | Admitting: Podiatry

## 2022-03-11 ENCOUNTER — Ambulatory Visit (INDEPENDENT_AMBULATORY_CARE_PROVIDER_SITE_OTHER): Payer: Medicare Other

## 2022-03-11 ENCOUNTER — Ambulatory Visit (INDEPENDENT_AMBULATORY_CARE_PROVIDER_SITE_OTHER): Payer: Medicare Other | Admitting: Podiatry

## 2022-03-11 ENCOUNTER — Other Ambulatory Visit: Payer: Self-pay | Admitting: Podiatry

## 2022-03-11 DIAGNOSIS — M779 Enthesopathy, unspecified: Secondary | ICD-10-CM | POA: Diagnosis not present

## 2022-03-11 DIAGNOSIS — M79671 Pain in right foot: Secondary | ICD-10-CM | POA: Diagnosis not present

## 2022-03-11 MED ORDER — TRIAMCINOLONE ACETONIDE 10 MG/ML IJ SUSP
10.0000 mg | Freq: Once | INTRAMUSCULAR | Status: AC
  Administered 2022-03-11: 10 mg

## 2022-03-12 NOTE — Progress Notes (Signed)
Subjective:   Patient ID: Desiree Parker, female   DOB: 76 y.o.   MRN: [id number]   HPI Patient presents stating she is getting a lot of pain in the bottom of the right heel for the last few months   ROS      Objective:  Physical Exam  Neurovascular status intact exquisite discomfort medial fascial band right insertional point     Assessment:  Acute plantar fasciitis right     Plan:  Sterile prep injected the plantar fascia right 3 mg Kenalog 5 mg Xylocaine instructed on stretching reappoint to recheck

## 2022-03-30 ENCOUNTER — Ambulatory Visit: Payer: Self-pay

## 2022-03-30 NOTE — Patient Outreach (Signed)
  Care Coordination   Follow Up Visit Note   03/30/2022 Name: [name] MRN: [id number] DOB: 12-23-1945  Desiree Parker is a 76 y.o. year old female who sees [name], MD for primary care. I spoke with  [name] by phone today.  What matters to the patients health and wellness today?  Right foot pain resolved but I had my right knee buckle    Goals Addressed               This Visit's Progress     I need to manage my blood pressure (pt-stated)        Care Coordination Interventions: Reviewed medications with patient and discussed importance of compliance Counseled on the importance of exercise goals with target of 150 minutes per week Discussed complications of poorly controlled blood pressure such as heart disease, stroke, circulatory complications, vision complications, kidney impairment, sexual dysfunction Active listening / Reflection utilized  Emotional Support Provided Problem [place] strategies reviewed Discussed the importance of checking blood pressure Use walker and cane if needed [name] reported that her blood pressure is doing well, with a reading of 127/63. She denied experiencing any headaches, chest pain, or flushing. However, she shared that she received an injection in her right heel due to pain that had prevented her from walking, but that the issue has improved now. On Sunday, she experienced her right knee buckling under her, which was a new experience for her. We discussed ways to prevent falls and the possibility of using a cane for added stability when walking outdoors. [name] agreed to the suggestion.        SDOH assessments and interventions completed:  No     Care Coordination Interventions:  Yes, provided   Follow up plan: Follow up call scheduled for 1/19  2 pm    Encounter Outcome:  Pt. Visit Completed   [name] RN, BSN, [facility]   Phone: [phone]

## 2022-03-30 NOTE — Patient Instructions (Signed)
Visit Information  Thank you for taking time to visit with me today. Please don't hesitate to contact me if I can be of assistance to you.   Following are the goals we discussed today:   Goals Addressed               This Visit's Progress     I need to manage my blood pressure (pt-stated)        Care Coordination Interventions: Reviewed medications with patient and discussed importance of compliance Counseled on the importance of exercise goals with target of 150 minutes per week Discussed complications of poorly controlled blood pressure such as heart disease, stroke, circulatory complications, vision complications, kidney impairment, sexual dysfunction Active listening / Reflection utilized  Emotional Support Provided Problem [place] strategies reviewed Discussed the importance of checking blood pressure Use walker and cane if needed Desiree Parker reported that her blood pressure is doing well, with a reading of 127/63. She denied experiencing any headaches, chest pain, or flushing. However, she shared that she received an injection in her right heel due to pain that had prevented her from walking, but that the issue has improved now. On Sunday, she experienced her right knee buckling under her, which was a new experience for her. We discussed ways to prevent falls and the possibility of using a cane for added stability when walking outdoors. Desiree Parker agreed to the suggestion.        Our next appointment is by telephone on 1/19 at 2 pm  Please call the care guide team at [phone] if you need to cancel or reschedule your appointment.   If you are experiencing a Mental Health or [place] or need someone to talk to, please call 1-800-273-TALK (toll free, 24 hour hotline)  The patient verbalized understanding of instructions, educational materials, and care plan provided today.    [name] RN, BSN, [facility]    Phone: [phone]

## 2022-04-15 ENCOUNTER — Other Ambulatory Visit: Payer: Self-pay | Admitting: Family Medicine

## 2022-04-17 ENCOUNTER — Other Ambulatory Visit: Payer: Self-pay | Admitting: Family Medicine

## 2022-04-17 DIAGNOSIS — Z91148 Patient's other noncompliance with medication regimen for other reason: Secondary | ICD-10-CM

## 2022-04-30 ENCOUNTER — Ambulatory Visit: Payer: Self-pay

## 2022-05-01 NOTE — Patient Instructions (Signed)
Visit Information  Thank you for taking time to visit with me today. Please don't hesitate to contact me if I can be of assistance to you.   Following are the goals we discussed today:   Goals Addressed               This Visit's Progress     I need to manage my blood pressure (pt-stated)        Care Coordination Interventions: Reviewed medications with patient and discussed importance of compliance Counseled on the importance of exercise goals with target of 150 minutes per week Discussed complications of poorly controlled blood pressure such as heart disease, stroke, circulatory complications, vision complications, kidney impairment, sexual dysfunction Active listening / Reflection utilized  Emotional Support Provided Problem [place] strategies reviewed Discussed the importance of checking blood pressure Use walker and cane if needed Mrs. [place]. is doing well and has been out for a little exercise. The nurse from [facility] visited her yesterday and checked her blood pressure, which was 120/80. She couldn't recall the exact number for the diastolic. While Mrs. Welke denies any headaches or chest pain, she experiences shortness of breath with exertion. I advised her to continue with her regular routine. She does undertand what to do if she has iany iregularities        Our next appointment is by telephone on 05/14/22 at 845  Please call the care guide team at [phone] if you need to cancel or reschedule your appointment.   If you are experiencing a Mental Health or [place] or need someone to talk to, please call 1-800-273-TALK (toll free, 24 hour hotline)  The patient verbalized understanding of instructions, educational materials, and care plan provided today   [name] RN, BSN, [place] [facility]   Phone: [phone]

## 2022-05-01 NOTE — Patient Outreach (Signed)
  Care Coordination   Follow Up Visit Note   05/01/2022 Late Entry Name: Desiree Parker MRN: [id number] DOB: 09-01-1945  Desiree Parker is a 77 y.o. year old female who sees [name], MD for primary care. I spoke with  Desiree Parker by phone today.  What matters to the patients health and wellness today?  I am doing well    Goals Addressed               This Visit's Progress     I need to manage my blood pressure (pt-stated)        Care Coordination Interventions: Reviewed medications with patient and discussed importance of compliance Counseled on the importance of exercise goals with target of 150 minutes per week Discussed complications of poorly controlled blood pressure such as heart disease, stroke, circulatory complications, vision complications, kidney impairment, sexual dysfunction Active listening / Reflection utilized  Emotional Support Provided Problem [place] strategies reviewed Discussed the importance of checking blood pressure Use walker and cane if needed Desiree Parker. is doing well and has been out for a little exercise. The nurse from [facility] visited her yesterday and checked her blood pressure, which was 120/80. She couldn't recall the exact number for the diastolic. While Desiree Parker denies any headaches or chest pain, she experiences shortness of breath with exertion. I advised her to continue with her regular routine. She does undertand what to do if she has iany iregularities        SDOH assessments and interventions completed:  No     Care Coordination Interventions:  Yes, provided   Follow up plan: Follow up call scheduled for 05/14/22 845    Encounter Outcome:  Pt. Visit Completed   [name] RN, BSN, [facility]   Phone: [phone]

## 2022-05-14 ENCOUNTER — Ambulatory Visit: Payer: Self-pay

## 2022-05-14 NOTE — Patient Outreach (Signed)
  Care Coordination   Follow Up Visit Note   05/14/2022 Name: Desiree Parker MRN: [id number] DOB: January 11, 1946  Desiree Parker is a 77 y.o. year old female who sees [name], MD for primary care. I spoke with  [name] by phone today.  What matters to the patients health and wellness today?  The patient's condition is stable, and her blood pressure this morning was 127/63. She is eating and sleeping well. She has opted for meal portions. She has not experienced any chest pain or swelling, and the shortness of breath is usual for her and not something unusual.    Goals Addressed                            I want to manage and maintain my blood pressure.        Patient Goals/Self Care Activities: -Patient/Caregiver will self-administer medications as prescribed as evidenced by self-report/primary caregiver report  -Patient/Caregiver will attend all scheduled provider appointments as evidenced by clinician review of documented attendance to scheduled appointments and patient/caregiver report -Patient/Caregiver will call pharmacy for medication refills as evidenced by patient report and review of pharmacy fill history as appropriate -Patient/Caregiver will call provider office for new concerns or questions as evidenced by review of documented incoming telephone call notes and patient report -Patient/Caregiver verbalizes understanding of plan  -Checks BP and records as discussed -Follows a low sodium diet/DASH diet          SDOH assessments and interventions completed:  No     Care Coordination Interventions:  Yes, provided   Interventions Today    Flowsheet Row Most Recent Value  Chronic Disease Discussed/Reviewed   Chronic disease discussed/reviewed during today's visit Hypertension (HTN)  General Interventions   General Interventions Discussed/Reviewed General Interventions Discussed, General Interventions Reviewed  Exercise Interventions   Exercise  Discussed/Reviewed Exercise Discussed  Nutrition Interventions   Nutrition Discussed/Reviewed Nutrition Reviewed        Follow up plan: Follow up call scheduled for 06/17/22 10 am    Encounter Outcome:  Pt. Visit Completed   [name] RN, BSN, [facility]   Phone: [phone]

## 2022-05-14 NOTE — Patient Instructions (Signed)
Visit Information  Thank you for taking time to visit with me today. Please don't hesitate to contact me if I can be of assistance to you.   Following are the goals we discussed today:   Goals Addressed               This Visit's Progress     COMPLETED: I need to manage my blood pressure (pt-stated)        Care Coordination Interventions: Reviewed medications with patient and discussed importance of compliance Counseled on the importance of exercise goals with target of 150 minutes per week Discussed complications of poorly controlled blood pressure such as heart disease, stroke, circulatory complications, vision complications, kidney impairment, sexual dysfunction Active listening / Reflection utilized  Emotional Support Provided Problem [place] strategies reviewed Discussed the importance of checking blood pressure Use walker and cane if needed Desiree Parker. is doing well and has been out for a little exercise. The nurse from [facility] visited her yesterday and checked her blood pressure, which was 120/80. She couldn't recall the exact number for the diastolic. While Desiree Parker denies any headaches or chest pain, she experiences shortness of breath with exertion. I advised her to continue with her regular routine. She does undertand what to do if she has iany iregularities      I want to manage and maintain my blood pressure.        Patient Goals/Self Care Activities: -Patient/Caregiver will self-administer medications as prescribed as evidenced by self-report/primary caregiver report  -Patient/Caregiver will attend all scheduled provider appointments as evidenced by clinician review of documented attendance to scheduled appointments and patient/caregiver report -Patient/Caregiver will call pharmacy for medication refills as evidenced by patient report and review of pharmacy fill history as appropriate -Patient/Caregiver will call provider office for new concerns or questions as  evidenced by review of documented incoming telephone call notes and patient report -Patient/Caregiver verbalizes understanding of plan  -Checks BP and records as discussed -Follows a low sodium diet/DASH diet          Our next appointment is by telephone on 06/17/22 at 10 am  Please call the care guide team at [phone] if you need to cancel or reschedule your appointment.   If you are experiencing a Mental Health or [place] or need someone to talk to, please call 1-800-273-TALK (toll free, 24 hour hotline)  The patient verbalized understanding of instructions, educational materials, and care plan provided today and agreed to receive a mailed copy of patient instructions, educational materials, and care plan.   [name] RN, BSN, [facility]   Phone: [phone]

## 2022-05-20 ENCOUNTER — Encounter: Payer: Self-pay | Admitting: Pediatric Intensive Care

## 2022-05-24 ENCOUNTER — Other Ambulatory Visit: Payer: Self-pay | Admitting: Family Medicine

## 2022-05-24 DIAGNOSIS — M810 Age-related osteoporosis without current pathological fracture: Secondary | ICD-10-CM

## 2022-05-27 NOTE — Progress Notes (Signed)
Synopsis: Referred for dyspnea by [name], MD  Subjective:   PATIENT ID: Desiree Parker: female DOB: March 02, 1946, MRN: [id number]  Chief Complaint  Patient presents with   Follow-up    Review PFT.  Mild cough persistent.  Still has SOB with exertion   77yF with history of smoking 60 py quit 1990, memory issues, HTN referred for dyspnea  She has had DOE for 3 years. Trouble walking to her apt or out to her son's car without getting winded. She is taking breo which she thinks may be helpful, rinses mouth after using it. She has an albuterol rescue inhaler which sometimes helps when she uses it. She snores, is sleepy during the day. No PND. No witnessed apneas.   She has gained 13 lb since her last sleep study.   Doesn't look like she's needed any courses of steroids or ABX for AECOPD/asthma.   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 No MJ, vaping.   Interval HPI: Only mild reduction in diffusing capacity on PFT. Obstruction, BD response may have been masked by breo/spiriva. While there is slight decrease in lung function she still does not have obstruction off of her inhalers, nor BD response.  Mild cough, similar DOE. She has no sinus congestion/PND, she has no heartburn/reflux but does still take omeprazole. Cough only occasionally bothers her.   PSG with moderate OSA   Otherwise pertinent review of systems is negative.  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  There is infiltrate consistent with pneumonia in portions of the right lower lobe. There is patchy atelectasis in the left lower lobe. There is no appreciable adenopathy. The prominence in the right hilum noted previously is apparently due to vascular prominence as opposed to mass or adenopathy. 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   Noncompliance with medications 09/11/2015   Osteoporosis    TOBACCO USER 03/04/2009   Snuff dipper       Family History  Problem Relation Age of Onset   Diabetes Father    Hypertension Mother    Diabetes Mother    Lung cancer Mother    Hypertension Sister    Asthma Son    Colon polyps Neg Hx    Esophageal cancer Neg Hx    Stomach cancer Neg Hx    Rectal cancer Neg Hx      Past Surgical History:  Procedure Laterality Date   [place]   states gallstones removed   TUBAL LIGATION  1975    Social History   Socioeconomic History   Marital status: Single    Spouse name: Not on file   Number of children: 1   Years of education: 72  Highest education level: Not on file  Occupational History   Not on file  Tobacco Use   Smoking status: Former    Packs/day: 2.00    Years: 27.00    Total pack years: 54.00    Types: Cigarettes    Quit date: 09/24/1988    Years since quitting: 33.6    Passive exposure: Past   Smokeless tobacco: Former    Types: Snuff    Quit date: 07/05/2018  Substance and Sexual Activity   Alcohol use: Yes    Alcohol/week: 2.0 standard drinks of alcohol    Types: 2 Standard drinks or equivalent per week   Drug use: No   Sexual activity: Not Currently    Birth control/protection: Surgical  Other Topics Concern   Not on file  Social History Narrative   Lives alone, retired  Geophysicist/field seismologist   Son, Tephanie Arch in [place] Strain: [place]  (02/22/2022)   Overall Financial Resource Strain (CARDIA)    Difficulty of Paying Living Expenses: Not very hard  Food Insecurity: No Food Insecurity (05/20/2022)   Hunger Vital Sign    Worried About Running Out of Food in the Last Year: Never true    Ran Out of Food in the Last Year: Never true  Transportation Needs: No Transportation Needs (05/20/2022)   PRAPARE - Hydrologist (Medical): No    Lack of Transportation (Non-Medical): No  Physical Activity: Inactive (02/22/2022)   Exercise Vital Sign    Days of Exercise per Week: 0 days    Minutes of Exercise per Session: 0 min  Stress: No Stress Concern Present (02/22/2022)   [place]    Feeling of Stress : Not at all  Social Connections: Not on file  Intimate Partner Violence: Not At Risk (05/20/2022)   Humiliation, Afraid, Rape, and Kick questionnaire    Fear of Current or Ex-Partner: No    Emotionally Abused: No    Physically Abused: No    Sexually Abused: No     No Known Allergies   Outpatient Medications Prior to Visit  Medication Sig Dispense Refill   acetaminophen (TYLENOL) 325 MG tablet Take 650 mg by mouth every 4 (four) hours as needed for mild pain.     albuterol (VENTOLIN HFA) 108 (90 Base) MCG/ACT inhaler Inhale 2 puffs into the lungs every 6 (six) hours as needed for wheezing or shortness of breath. 25.5 g 6   alendronate (FOSAMAX) 70 MG tablet TAKE 1 TABLET BY MOUTH EVERY 7 DAYS WITH A FULL GLASS OF WATER AND ON AN EMPTY STOMACH 12 tablet 2   amLODipine (NORVASC) 10 MG tablet Take 1 tablet (10 mg total) by mouth daily. 30 tablet 1   aspirin EC 81 MG tablet Take 1 tablet (81 mg total) by mouth daily. Swallow whole. 30 tablet 11   atorvastatin (LIPITOR) 20 MG tablet TAKE 1 TABLET(20 MG) BY MOUTH DAILY AT  6 PM 90 tablet 1   Blood Glucose Monitoring Suppl (ONETOUCH VERIO REFLECT) w/Device KIT 1 Units by Does not apply route 3 (three) times daily. 1 kit 0   Blood Glucose Monitoring Suppl (ONETOUCH VERIO) w/Device KIT To check blood sugar once daily. E11.9 1 kit 0   calcium-vitamin D (OSCAL WITH D) 500-200 MG-UNIT tablet Take 1 tablet by mouth 2 (two) times daily. 60 tablet 4  fluticasone furoate-vilanterol (BREO ELLIPTA) 100-25 MCG/ACT AEPB Inhale 1 puff into the lungs daily. 60 each 1   glucose blood (ONETOUCH VERIO) test strip TO CHECK BLOOD SUGAR ONCE DAILY 100 strip 3   Lancet Devices (ONE TOUCH DELICA LANCING DEV) MISC To check blood sugar once daily. E11.9 1 each 0   Lancets (ONETOUCH DELICA PLUS 123XX123) MISC TO CHECK BLOOD SUGAR ONCE DAILY 100 each 3   lisinopril (ZESTRIL) 20 MG tablet TAKE 1 TABLET(20 MG) BY MOUTH DAILY 90 tablet 1   metFORMIN (GLUCOPHAGE-XR) 500 MG 24 hr tablet TAKE 1 TABLET(500 MG) BY MOUTH EVERY OTHER DAY 45 tablet 1   omeprazole (PRILOSEC) 20 MG capsule TAKE ONE CAPSULE BY MOUTH EVERY DAY AS NEEDED. PROLONGED USE. MAY CAUSE RENAL IMPAIRMENT 90 capsule 1   tiotropium (SPIRIVA HANDIHALER) 18 MCG inhalation capsule INHALE THE CONTENT OF 1 CAPSULE VIA HANDIHALER BY MOUTH EVERY MORNING 90 capsule 2   No facility-administered medications prior to visit.       Objective:   Physical Exam:  General appearance: 77 y.o., female, NAD, conversant  Eyes: anicteric sclerae; PERRL, tracking appropriately HENT: NCAT; MMM Neck: Trachea midline; no lymphadenopathy, no JVD Lungs: CTAB, no crackles, no wheeze, with normal respiratory effort CV: RRR, no murmur  Abdomen: Soft, non-tender; non-distended, BS present  Extremities: No peripheral edema, warm Skin: Normal turgor and texture; no rash Psych: Appropriate affect Neuro: Alert and oriented to person and place, no focal deficit      Vitals:   05/28/22 1506  BP: (!) 150/70  Pulse: (!) 101  Temp: 98.1 F (36.7 C)   TempSrc: Oral  SpO2: 98%  Weight: 203 lb 6.4 oz (92.3 kg)  Height: 4' 11"$  (1.499 m)     98% on RA BMI Readings from Last 3 Encounters:  05/28/22 41.08 kg/m  02/23/22 41.51 kg/m  01/25/22 41.40 kg/m   Wt Readings from Last 3 Encounters:  05/28/22 203 lb 6.4 oz (92.3 kg)  02/23/22 205 lb 8 oz (93.2 kg)  01/25/22 205 lb (93 kg)     CBC    Component Value Date/Time   WBC 8.7 10/28/2021 1542   RBC 4.17 10/28/2021 1542   HGB 12.6 10/28/2021 1542   HGB 12.7 03/07/2019 0939   HCT 37.5 10/28/2021 1542   HCT 37.1 03/07/2019 0939   PLT 269.0 10/28/2021 1542   PLT 257 03/07/2019 0939   MCV 90.1 10/28/2021 1542   MCV 88 03/07/2019 0939   MCH 30.2 03/07/2019 0939   MCH 29.3 10/29/2015 0756   MCHC 33.5 10/28/2021 1542   RDW 13.6 10/28/2021 1542   RDW 12.7 03/07/2019 0939   LYMPHSABS 3.2 10/28/2021 1542   MONOABS 0.7 10/28/2021 1542   EOSABS 0.2 10/28/2021 1542   BASOSABS 0.1 10/28/2021 1542      Chest Imaging: CT Chest 07/10/21 reviewed by me unremarkable  CXR 10/28/21 reviewed by me unchanged  Pulmonary Functions Testing Results:    Latest Ref Rng & Units 05/28/2022    1:51 PM 12/23/2021   12:58 PM 09/04/2019   10:46 AM  PFT Results  FVC-Pre L 1.60  P 1.74  1.97  P  FVC-Predicted Pre % 71  P 78  111  P  FVC-Post L 1.64  P 1.79  1.99  P  FVC-Predicted Post % 73  P 80  112  P  Pre FEV1/FVC % % 84  P 85  85  P  Post FEV1/FCV % % 87  P 87  88  P  FEV1-Pre L 1.35  P 1.49  1.67  P  FEV1-Predicted Pre % 81  P 90  123  P  FEV1-Post L 1.42  P 1.55  1.75  P  DLCO uncorrected ml/min/mmHg 10.53  P 10.61  12.56  P  DLCO UNC% % 65  P 66  77  P  DLCO corrected ml/min/mmHg 10.53  P 10.89  12.56  P  DLCO COR %Predicted % 65  P 67  77  P  DLVA Predicted % 80  P 82  94  P  TLC L 3.61  P 3.94    TLC % Predicted % 83  P 91    RV % Predicted % 90  P 106      P Preliminary result    PFT 12/23/21 reviewed by me with mildly reduced diffusing capacity  PFT 2021 with Mild  restriction, low erv, borderline normal DLCO  PSG 02/04/22: - Moderate obstructive sleep apnea occurred during this study (AHI = 16.6/h). Events were predominantly noted during REM. Supine sleep was minimal. - No significant central sleep apnea occurred during this study (CAI = 0.2/h). - Mild oxygen desaturation was noted during this study (Min O2 = 83.0%). - The patient snored with moderate snoring volume. - No cardiac abnormalities were noted during this study. - Clinically significant periodic limb movements did not occur during sleep. No significant associated arousals.  PSG 08/2019: IMPRESSIONS - Mild obstructive sleep apnea occurred during this study (AHI = 8.8/h). - No significant central sleep apnea occurred during this study (CAI = 0.0/h). - Oxygen desaturation was noted during this study (Min O2 = 86.00%). Mean sat 94.89%. - Time with O2 saturation 88% or less was 0.9 minutes. - The patient snored with soft snoring volume. - EKG findings include PVCs. - Clinically significant periodic limb movements did not occur during sleep. No significant associated arousals.  Echocardiogram:   TTE 07/02/21:  1. Left ventricular ejection fraction, by estimation, is 55 to 60%. The  left ventricle has normal function. The left ventricle has no regional  wall motion abnormalities. Left ventricular diastolic parameters were  normal.   2. Right ventricular systolic function is normal. The right ventricular  size is normal. Tricuspid regurgitation signal is inadequate for assessing  PA pressure.   3. The mitral valve is grossly normal. Trivial mitral valve  regurgitation. No evidence of mitral stenosis.   4. The aortic valve is grossly normal. Aortic valve regurgitation is not  visualized. No aortic stenosis is present.   Nuke stress 07/2019:  The left ventricular ejection fraction is hyperdynamic (>65%). Nuclear stress EF: 75%. There was no ST segment deviation noted during stress. The  study is normal. This is a low risk study.    Assessment & Plan:   # DOE  May be multifactorial and unclear to what extent deconditioning, obesity, untreated OSA playing roles. Though she doesn't have obstruction, emphysema, or bronchodilator response she has decent symptomatic response to inhalers so we'll restart them.   # Moderate OSA Not candidate for MADD. Weight too high for inspire.   Plan: - CPAP may help you feel more refreshed during the day, greater exercise capacity - let us know if you'd like to try it.  - ok to resume breo, spiriva  - albuterol as needed  - see you in 6 months or sooner if need be!   RTC 6 months   Maryjane Hurter, MD [facility] 05/28/2022 4:32 PM

## 2022-05-28 ENCOUNTER — Encounter: Payer: Self-pay | Admitting: Student

## 2022-05-28 ENCOUNTER — Ambulatory Visit (INDEPENDENT_AMBULATORY_CARE_PROVIDER_SITE_OTHER): Payer: 59 | Admitting: Student

## 2022-05-28 VITALS — BP 150/70 | HR 101 | Temp 98.1°F | Ht 59.0 in | Wt 203.4 lb

## 2022-05-28 DIAGNOSIS — G4733 Obstructive sleep apnea (adult) (pediatric): Secondary | ICD-10-CM | POA: Diagnosis not present

## 2022-05-28 DIAGNOSIS — R0609 Other forms of dyspnea: Secondary | ICD-10-CM

## 2022-05-28 LAB — PULMONARY FUNCTION TEST
DL/VA % pred: 80 %
DL/VA: 3.43 ml/min/mmHg/L
DLCO cor % pred: 65 %
DLCO cor: 10.53 ml/min/mmHg
DLCO unc % pred: 65 %
DLCO unc: 10.53 ml/min/mmHg
FEF 25-75 Post: 1.93 L/sec
FEF 25-75 Pre: 1.56 L/sec
FEF2575-%Change-Post: 23 %
FEF2575-%Pred-Post: 139 %
FEF2575-%Pred-Pre: 113 %
FEV1-%Change-Post: 5 %
FEV1-%Pred-Post: 86 %
FEV1-%Pred-Pre: 81 %
FEV1-Post: 1.42 L
FEV1-Pre: 1.35 L
FEV1FVC-%Change-Post: 2 %
FEV1FVC-%Pred-Pre: 112 %
FEV6-%Change-Post: 2 %
FEV6-%Pred-Post: 77 %
FEV6-%Pred-Pre: 76 %
FEV6-Post: 1.63 L
FEV6-Pre: 1.6 L
FEV6FVC-%Pred-Post: 105 %
FEV6FVC-%Pred-Pre: 105 %
FVC-%Change-Post: 2 %
FVC-%Pred-Post: 73 %
FVC-%Pred-Pre: 71 %
FVC-Post: 1.64 L
FVC-Pre: 1.6 L
Post FEV1/FVC ratio: 87 %
Post FEV6/FVC ratio: 100 %
Pre FEV1/FVC ratio: 84 %
Pre FEV6/FVC Ratio: 100 %
RV % pred: 90 %
RV: 1.84 L
TLC % pred: 83 %
TLC: 3.61 L

## 2022-05-28 NOTE — Progress Notes (Signed)
Full PFT completed today 

## 2022-05-28 NOTE — Patient Instructions (Signed)
-   CPAP may help you feel more refreshed during the day, greater exercise capacity - ok to resume breo, spiriva  - albuterol as needed  - see you in 6 months or sooner if need be!

## 2022-06-01 ENCOUNTER — Telehealth: Payer: Self-pay

## 2022-06-01 ENCOUNTER — Encounter: Payer: Self-pay | Admitting: *Deleted

## 2022-06-01 NOTE — Progress Notes (Signed)
Chart documentation indicates pt's PCP is Dr. Gwendlyn Deutscher and pt has [facility], [name];  in-basket message sent to PCP and [facility] about 05/20/2022 screening event b/p of 171/81 and 180/84. [facility] [place] documented attempts to reach pt and contact today, 05/20/22 and PCP in-basket response received today:  "[name], MD  [name], RN; P [facility] Blue Pool:"Hello [facility] blue team CMA, please contact patient and help her schedule BP F/U appointment with me. Thanks." Unable to reach pt by phone X 3 attempts; letter sent asking pt to f/u with PCP r/t elevated b/p

## 2022-06-02 NOTE — Patient Outreach (Signed)
  Care Coordination   Follow Up Visit Note   06/02/2022 Name: Desiree Parker MRN: [id number] DOB: [birth year]  Desiree Parker is a 77 y.o. year old female who sees Desiree Feil, MD for primary care. I spoke with  Desiree Parker by phone Yesterday.  What matters to the patients health and wellness today?  Yesterday, RNCM contacted the patient to discuss her health status. During the conversation, the patient reported that she is not experiencing any unusual symptoms such as headaches, chest pain, or shortness of breath. However, she was informed that Dr. Gwendlyn Parker needs to see her regarding her blood pressure. The patient requested that her son, Desiree Parker, be informed about the appointment since he would be the one to take her. Therefore, a message was left for Desiree Parker yesterday and again today, reminding him to schedule an appointment.    SDOH assessments and interventions completed:  No     Care Coordination Interventions:  Yes, provided   Interventions Today    Flowsheet Row Most Recent Value  Chronic Disease   Chronic disease during today's visit Hypertension (HTN)  General Interventions   General Interventions Discussed/Reviewed General Interventions Discussed       Follow up plan:  next scheduled interval    Encounter Outcome:  Pt. Visit Completed   Desiree Arms RN, BSN, [place]   Phone: [phone]

## 2022-06-03 NOTE — Progress Notes (Signed)
Scheduled patient bp f/u with Dr.Eniola on 03/19 @ 830 a.m

## 2022-06-17 ENCOUNTER — Ambulatory Visit: Payer: Self-pay

## 2022-06-17 NOTE — Patient Instructions (Signed)
Visit Information  Thank you for taking time to visit with me today. Please don't hesitate to contact me if I can be of assistance to you.   Following are the goals we discussed today:   Goals Addressed             This Visit's Progress    I want to manage and maintain my blood pressure.       Patient Goals/Self Care Activities: -Patient/Caregiver will self-administer medications as prescribed as evidenced by self-report/primary caregiver report  -Patient/Caregiver will attend all scheduled provider appointments as evidenced by clinician review of documented attendance to scheduled appointments and patient/caregiver report -Patient/Caregiver will call pharmacy for medication refills as evidenced by patient report and review of pharmacy fill history as appropriate -Patient/Caregiver will call provider office for new concerns or questions as evidenced by review of documented incoming telephone call notes and patient report -Patient/Caregiver verbalizes understanding of plan  -Checks BP and records as discussed -Follows a low sodium diet/DASH diet  -Continue her exercise -remember to check her blood presssure        Our next appointment is by telephone on 07/22/22 at 1030 am  Please call the care guide team at [phone] if you need to cancel or reschedule your appointment.   If you are experiencing a Mental Health or [place] or need someone to talk to, please call 1-800-273-TALK (toll free, 24 hour hotline)  The patient verbalized understanding of instructions, educational materials, and care plan provided today and agreed to receive a mailed copy of patient instructions, educational materials, and care plan.   [name] RN, BSN, [facility]   Phone: [phone]

## 2022-06-17 NOTE — Patient Outreach (Signed)
  Care Coordination   Follow Up Visit Note   06/17/2022 Name: Desiree Parker MRN: [id number] DOB: Jan 30, 1946  Desiree Parker is a 77 y.o. year old female who sees Desiree Feil, MD for primary care. I spoke with  Desiree Parker by phone today.  What matters to the patients health and wellness today?  Desiree Parker has expressed her desire to move from her current residence due to several problems. Although her blood pressure has been good, she hasn't been monitoring it regularly. She has an appointment on the 19th of this month to check it. She has reported no chest pain or headaches, but she does have a regular shortness of breath nothing concerning. She will use her inhalers as prescribed. She is walking around her building as her exercise when waether permitts. Desiree Parker is currently taking her medications as prescribed.    Goals Addressed             This Visit's Progress    I want to manage and maintain my blood pressure.       Patient Goals/Self Care Activities: -Patient/Caregiver will self-administer medications as prescribed as evidenced by self-report/primary caregiver report  -Patient/Caregiver will attend all scheduled provider appointments as evidenced by clinician review of documented attendance to scheduled appointments and patient/caregiver report -Patient/Caregiver will call pharmacy for medication refills as evidenced by patient report and review of pharmacy fill history as appropriate -Patient/Caregiver will call provider office for new concerns or questions as evidenced by review of documented incoming telephone call notes and patient report -Patient/Caregiver verbalizes understanding of plan  -Checks BP and records as discussed -Follows a low sodium diet/DASH diet  -Continue her exercise -remember to check her blood presssure        SDOH assessments and interventions completed:  No     Care Coordination Interventions:  Yes, provided   Interventions Today     Flowsheet Row Most Recent Value  Chronic Disease   Chronic disease during today's visit Hypertension (HTN)  General Interventions   General Interventions Discussed/Reviewed General Interventions Discussed, General Interventions Reviewed  Exercise Interventions   Exercise Discussed/Reviewed Exercise Discussed  Education Interventions   Education Provided Provided Education  Provided Verbal Education On Exercise  Safety Interventions   Safety Discussed/Reviewed Safety Discussed        Follow up plan: Follow up call scheduled for 07/22/22 1030 am    Encounter Outcome:  Pt. Visit Completed   [name] RN, BSN, [facility]   Phone: [phone]

## 2022-06-28 ENCOUNTER — Other Ambulatory Visit: Payer: Self-pay | Admitting: Family Medicine

## 2022-06-28 DIAGNOSIS — Z1231 Encounter for screening mammogram for malignant neoplasm of breast: Secondary | ICD-10-CM

## 2022-06-29 ENCOUNTER — Encounter: Payer: Self-pay | Admitting: Family Medicine

## 2022-06-29 ENCOUNTER — Ambulatory Visit (INDEPENDENT_AMBULATORY_CARE_PROVIDER_SITE_OTHER): Payer: 59 | Admitting: Family Medicine

## 2022-06-29 VITALS — BP 180/71 | HR 78 | Ht 59.0 in | Wt 204.2 lb

## 2022-06-29 DIAGNOSIS — R0609 Other forms of dyspnea: Secondary | ICD-10-CM | POA: Diagnosis not present

## 2022-06-29 DIAGNOSIS — Z8679 Personal history of other diseases of the circulatory system: Secondary | ICD-10-CM

## 2022-06-29 DIAGNOSIS — J449 Chronic obstructive pulmonary disease, unspecified: Secondary | ICD-10-CM

## 2022-06-29 DIAGNOSIS — E1169 Type 2 diabetes mellitus with other specified complication: Secondary | ICD-10-CM

## 2022-06-29 DIAGNOSIS — I10 Essential (primary) hypertension: Secondary | ICD-10-CM

## 2022-06-29 DIAGNOSIS — I700 Atherosclerosis of aorta: Secondary | ICD-10-CM

## 2022-06-29 LAB — POCT GLYCOSYLATED HEMOGLOBIN (HGB A1C): HbA1c, POC (controlled diabetic range): 6.9 % (ref 0.0–7.0)

## 2022-06-29 MED ORDER — AMLODIPINE BESYLATE 10 MG PO TABS: 10.0000 mg | ORAL_TABLET | Freq: Every day | ORAL | 1 refills | Status: DC

## 2022-06-29 NOTE — Assessment & Plan Note (Signed)
Statin and BP control.

## 2022-06-29 NOTE — Assessment & Plan Note (Signed)
A1C increased today but still below her goal of <7.5 Continue current regimen.

## 2022-06-29 NOTE — Assessment & Plan Note (Signed)
Exercise difficult due to gain issue. Continue to monitor diet for now.

## 2022-06-29 NOTE — Assessment & Plan Note (Signed)
I reviewed her pulmonary office note and PFT report. Counseling provided regarding compliance with her controller inhaler. As discussed previously, if still having issues with SOB on exertion, we will refer to the cardiologist for second opinion and evaluation. Her son agreed with referral.

## 2022-06-29 NOTE — Assessment & Plan Note (Deleted)
A1C increased today but still below her goal of <7.5 Continue current regimen.

## 2022-06-29 NOTE — Assessment & Plan Note (Signed)
Previous ECHO showed normal EF with GIDD. However, her most recent ECHO from 2023 was normal without diastolic dysfunction. I resolved her CHF diagnosis - not convinced she has an actual diagnosis. Regardless, given her SOB on exertion, plan to refer to Cards for an evaluation. Continue Controller meds for COPD.

## 2022-06-29 NOTE — Assessment & Plan Note (Signed)
BP uncontrolled due to medication non-adherence. I refilled her Norvasc and encouraged her to request refills when she is out. Continue Lisinopril 20 mg QD. Monitor BP closely at home. F/U in 4 weeks for reassessment.

## 2022-06-29 NOTE — Patient Instructions (Signed)
Allergies as of 06/29/2022   No Known Allergies      Medication List        Accurate as of June 29, 2022  8:58 AM. If you have any questions, ask your nurse or doctor.          acetaminophen 325 MG tablet Commonly known as: TYLENOL Take 650 mg by mouth every 4 (four) hours as needed for mild pain.   albuterol 108 (90 Base) MCG/ACT inhaler Commonly known as: VENTOLIN HFA Inhale 2 puffs into the lungs every 6 (six) hours as needed for wheezing or shortness of breath.   alendronate 70 MG tablet Commonly known as: FOSAMAX TAKE 1 TABLET BY MOUTH EVERY 7 DAYS WITH A FULL GLASS OF WATER AND ON AN EMPTY STOMACH   amLODipine 10 MG tablet Commonly known as: NORVASC Take 1 tablet (10 mg total) by mouth daily.   aspirin EC 81 MG tablet Take 1 tablet (81 mg total) by mouth daily. Swallow whole.   atorvastatin 20 MG tablet Commonly known as: LIPITOR TAKE 1 TABLET(20 MG) BY MOUTH DAILY AT 6 PM   calcium-vitamin D 500-200 MG-UNIT tablet Commonly known as: OSCAL WITH D Take 1 tablet by mouth 2 (two) times daily.   fluticasone furoate-vilanterol 100-25 MCG/ACT Aepb Commonly known as: Breo Ellipta Inhale 1 puff into the lungs daily.   lisinopril 20 MG tablet Commonly known as: ZESTRIL TAKE 1 TABLET(20 MG) BY MOUTH DAILY   metFORMIN 500 MG 24 hr tablet Commonly known as: GLUCOPHAGE-XR TAKE 1 TABLET(500 MG) BY MOUTH EVERY OTHER DAY   omeprazole 20 MG capsule Commonly known as: PRILOSEC TAKE ONE CAPSULE BY MOUTH EVERY DAY AS NEEDED. PROLONGED USE. MAY CAUSE RENAL IMPAIRMENT   ONE TOUCH DELICA LANCING DEV Misc To check blood sugar once daily. [id number]   OneTouch Delica Plus 0000000 Misc TO CHECK BLOOD SUGAR ONCE DAILY   OneTouch Verio test strip Generic drug: glucose blood TO CHECK BLOOD SUGAR ONCE DAILY   OneTouch Verio w/Device Kit To check blood sugar once daily. E11.9   OneTouch Verio Reflect w/Device Kit 1 Units by Does not apply route 3 (three) times daily.    Spiriva HandiHaler 18 MCG inhalation capsule Generic drug: tiotropium INHALE THE CONTENT OF 1 CAPSULE VIA HANDIHALER BY MOUTH EVERY MORNING

## 2022-06-29 NOTE — Progress Notes (Signed)
    SUBJECTIVE:   CHIEF COMPLAINT / HPI:   HTN: She is here for follow up. Compliant with Lisinopril 20 mg QD. Out of Norvasc. Denies headache, chest pain or change in vision.  COPD/Hx of CHF: Poor compliance with her controller medications. Still having issues with SOB on exertion. Denies chest pain. No leg swelling.  DM2/Obese: Compliant with Metformin XL 500 mg QOD. Working on lifestyle modification.  Gait: Uses a four wheel walker. She is concerned about one of the wheels giving out and needed a repair.     PERTINENT  PMH / PSH: PMHx reviewed  OBJECTIVE:   BP (!) 180/71   Pulse 78   Ht 4\' 11"  (1.499 m)   Wt 204 lb 3.2 oz (92.6 kg)   SpO2 100%   BMI 41.24 kg/m   Physical Exam Vitals and nursing note reviewed.  Cardiovascular:     Rate and Rhythm: Normal rate and regular rhythm.     Heart sounds: Normal heart sounds. No murmur heard. Pulmonary:     Effort: Pulmonary effort is normal. No respiratory distress.     Breath sounds: Normal breath sounds. No wheezing.      ASSESSMENT/PLAN:   HYPERTENSION, BENIGN SYSTEMIC BP uncontrolled due to medication non-adherence. I refilled her Norvasc and encouraged her to request refills when she is out. Continue Lisinopril 20 mg QD. Monitor BP closely at home. F/U in 4 weeks for reassessment.   Aortic atherosclerosis (HCC) Statin and BP control.  COPD (chronic obstructive pulmonary disease) ([place]) I reviewed her pulmonary office note and PFT report. Counseling provided regarding compliance with her controller inhaler. As discussed previously, if still having issues with SOB on exertion, we will refer to the cardiologist for second opinion and evaluation. Her son agreed with referral.   History of CHF (congestive heart failure) Previous ECHO showed normal EF with GIDD. However, her most recent ECHO from 2023 was normal without diastolic dysfunction. I resolved her CHF diagnosis - not convinced she has an actual  diagnosis. Regardless, given her SOB on exertion, plan to refer to Cards for an evaluation. Continue Controller meds for COPD.  Diabetes mellitus, type II ([place]) A1C increased today but still below her goal of <7.5 Continue current regimen.   Morbid obesity ([place]) Exercise difficult due to gain issue. Continue to monitor diet for now.    Regarding her rolling walker, I advised her to call the company and request a repair. She will get back to me soon if she has additional questions.   Andrena Mews, MD [facility]

## 2022-07-08 ENCOUNTER — Other Ambulatory Visit: Payer: Self-pay | Admitting: Family Medicine

## 2022-07-08 DIAGNOSIS — K219 Gastro-esophageal reflux disease without esophagitis: Secondary | ICD-10-CM

## 2022-07-13 ENCOUNTER — Encounter: Payer: Self-pay | Admitting: *Deleted

## 2022-07-20 IMAGING — MG MM DIGITAL SCREENING BILAT W/ TOMO AND CAD
6 of 10 series · 6 of 30 positions shown · non-contrast
Comparison: Previous exam(s).

ACR Breast Density Category a: The breast tissue is almost entirely
fatty.

CLINICAL DATA: Screening.

EXAM:
DIGITAL SCREENING BILATERAL MAMMOGRAM WITH TOMOSYNTHESIS AND CAD
TECHNIQUE: Bilateral screening digital craniocaudal and mediolateral oblique
mammograms were obtained. Bilateral screening digital breast
tomosynthesis was performed. The images were evaluated with
computer-aided detection.

[R MLO synth-2D (1 of 2)]
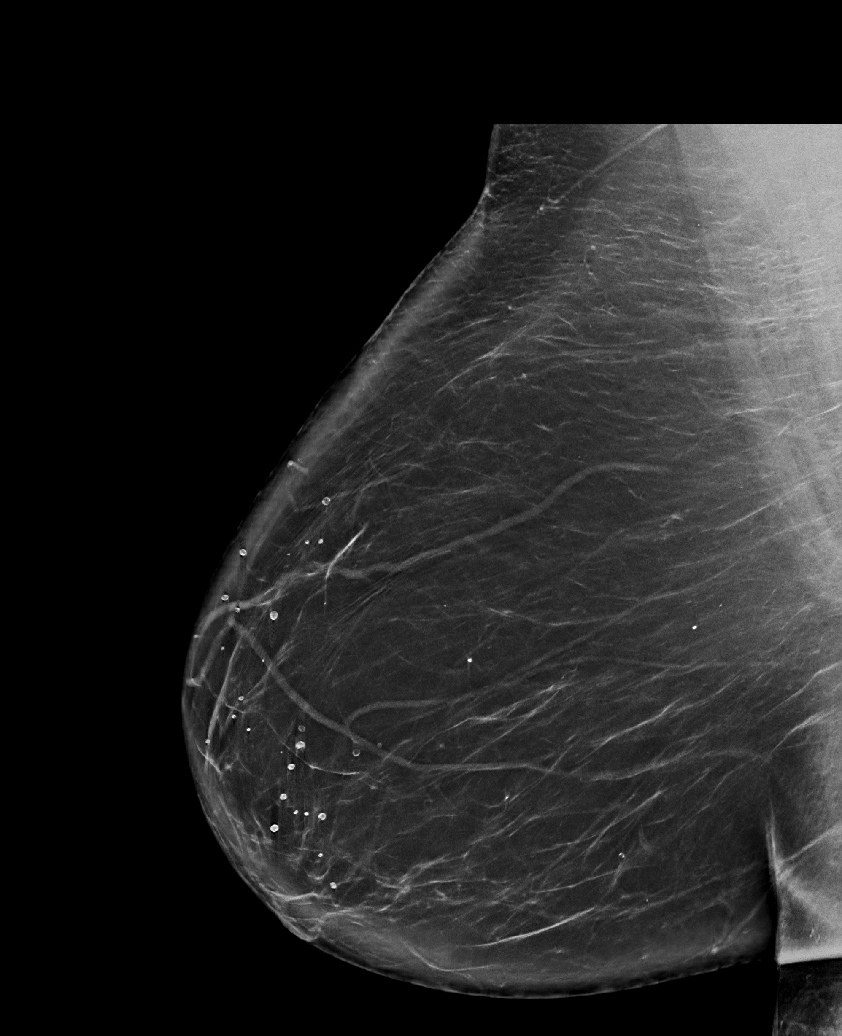

[R MLO synth-2D (2 of 2)]
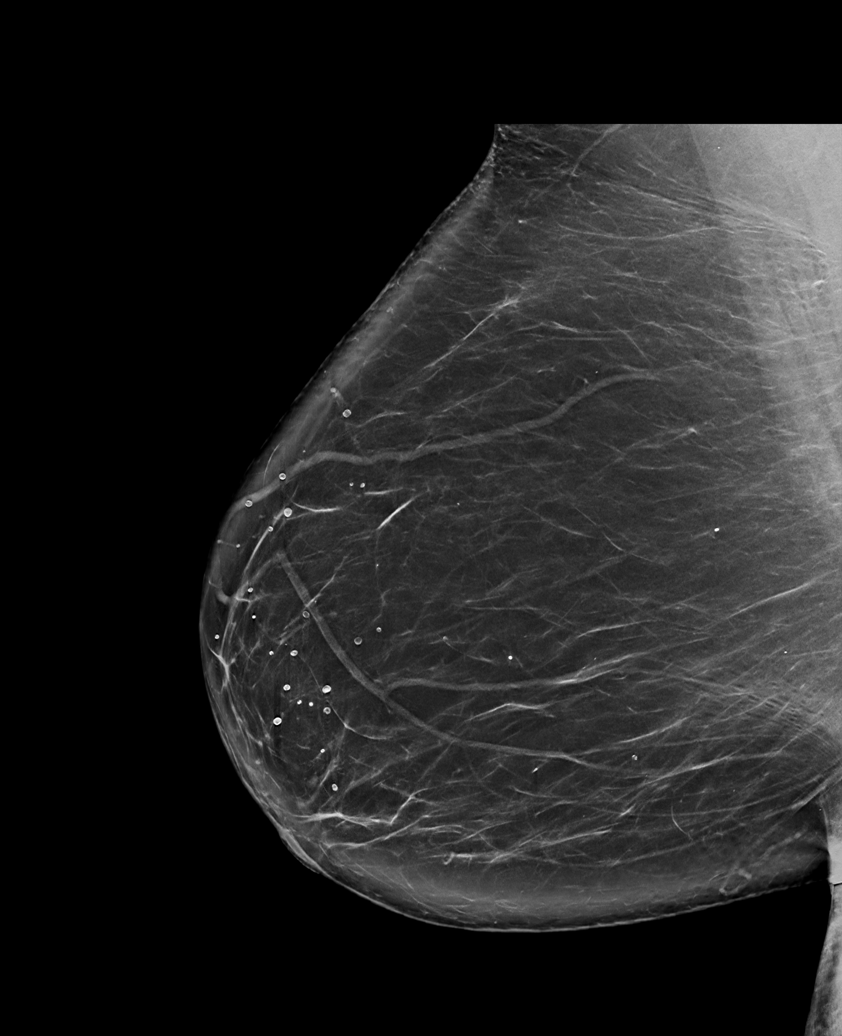

[R CC synth-2D]
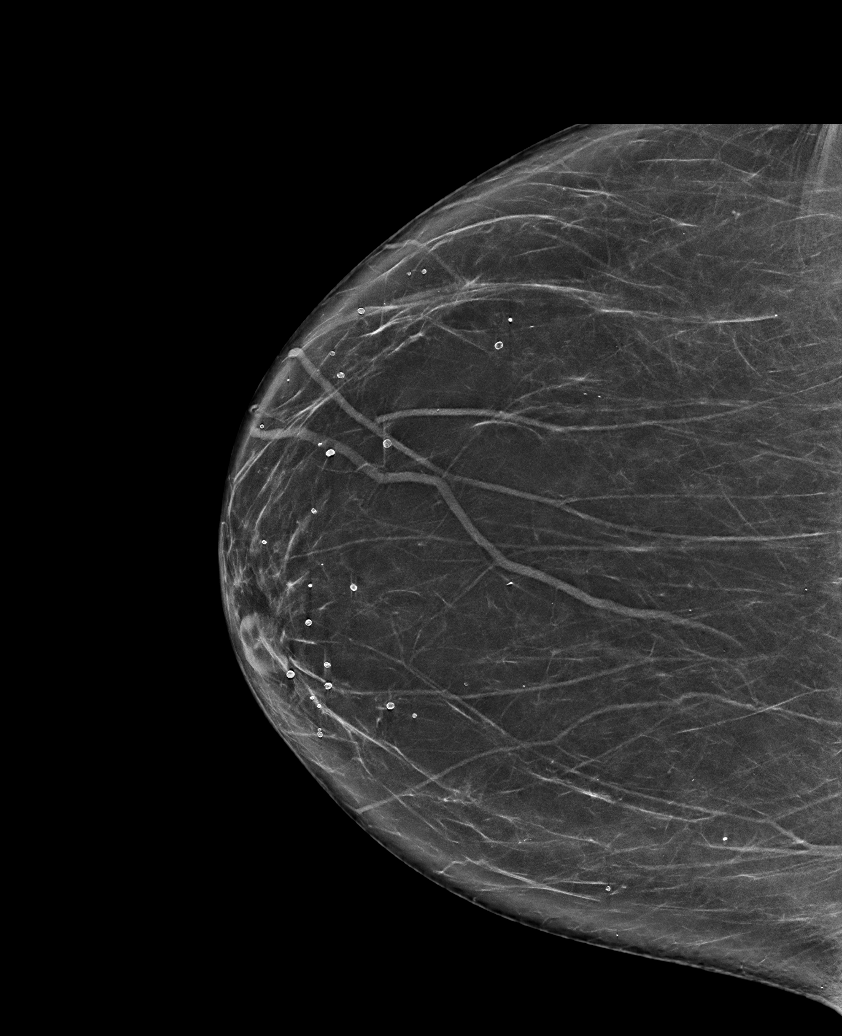

[L MLO synth-2D]
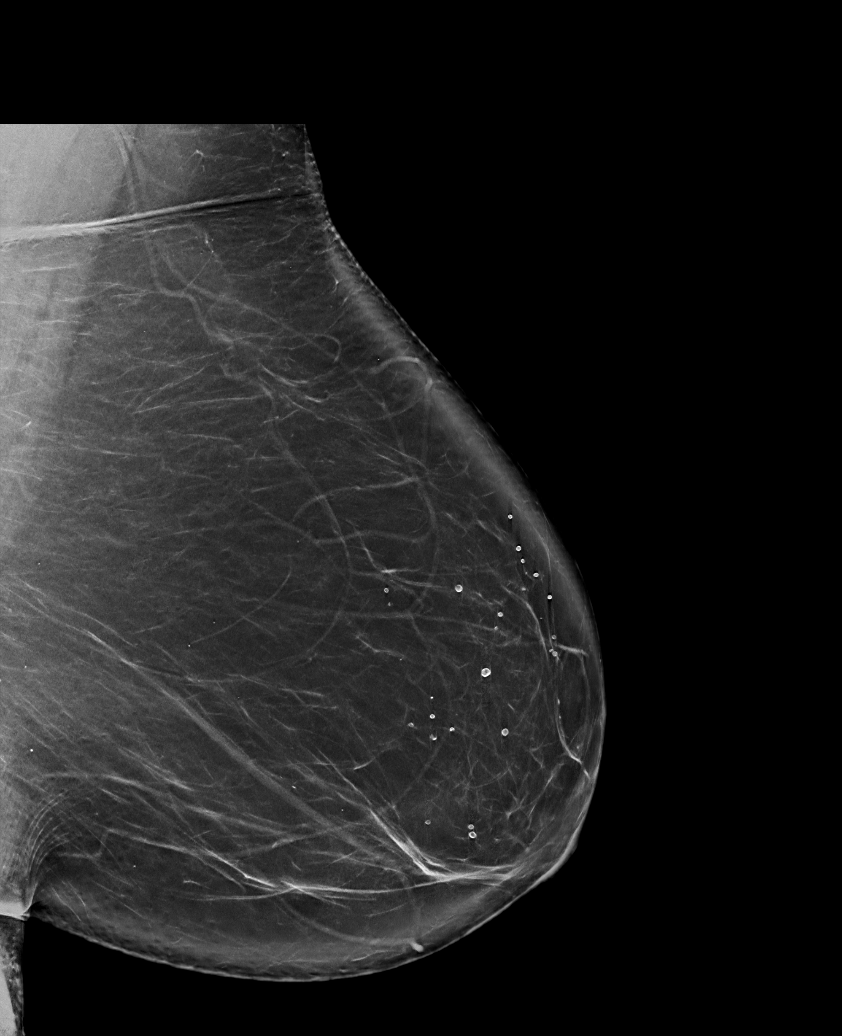

[L CC synth-2D]
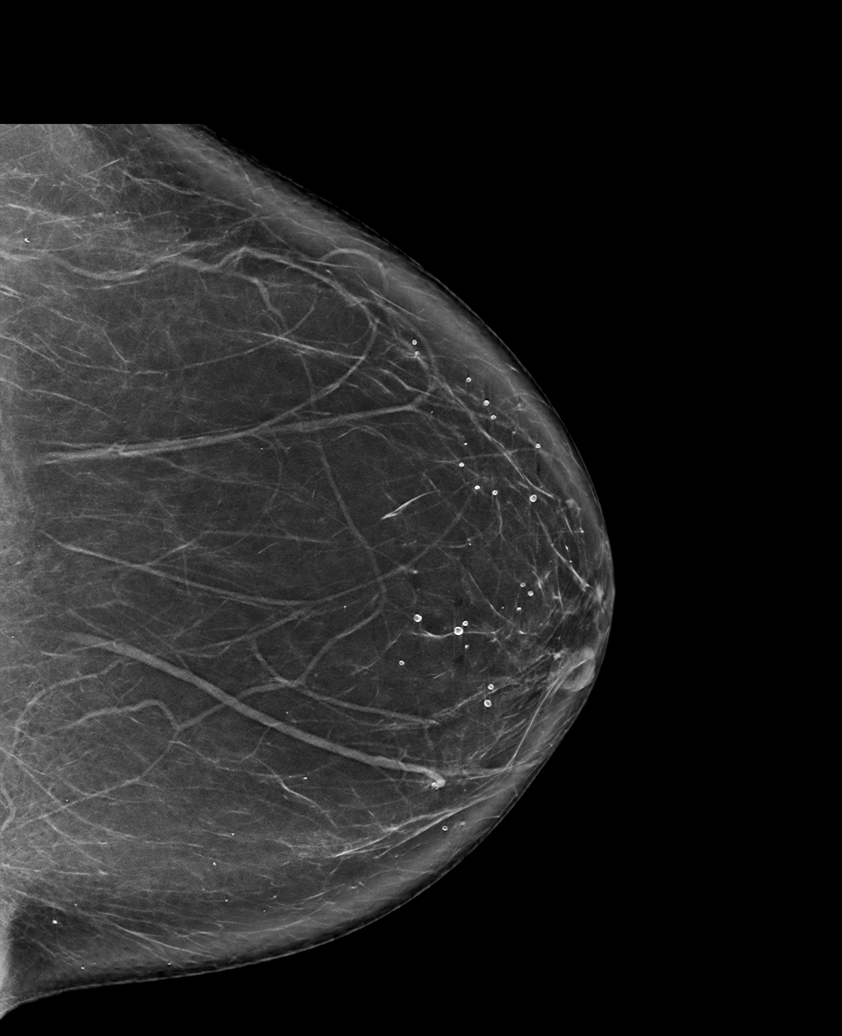

[L MLO tomo · tomo slice 49/97.0]
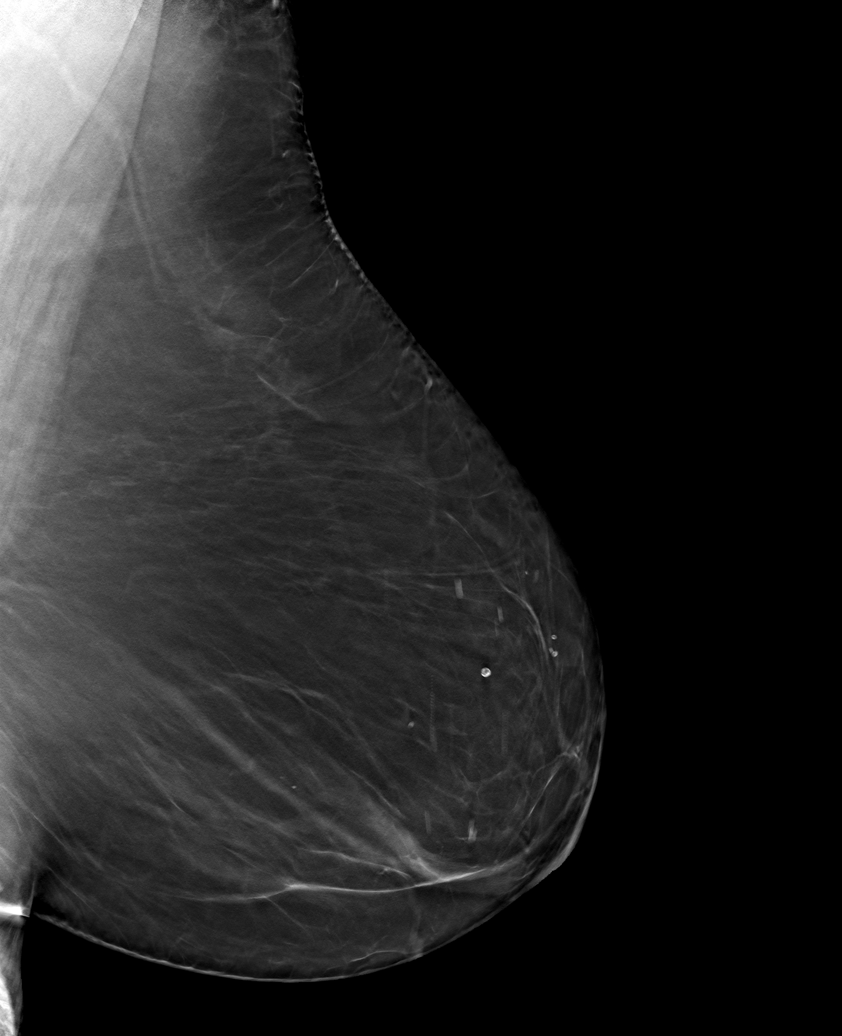

[6 of 30 positions shown; findings below may reference images not displayed]

FINDINGS: There are no findings suspicious for malignancy.
IMPRESSION: No mammographic evidence of malignancy. A result letter of this
screening mammogram will be mailed directly to the patient.

RECOMMENDATION:
Screening mammogram in one year. (Code:0E-3-N98)

BI-RADS CATEGORY  1: Negative.

## 2022-07-22 ENCOUNTER — Ambulatory Visit: Payer: Self-pay

## 2022-07-22 LAB — AMB RESULTS CONSOLE CBG: Glucose: 120

## 2022-07-22 NOTE — Patient Outreach (Signed)
  Care Coordination   Follow Up Visit Note   07/23/2022 Name: Desiree Parker MRN: [id number] DOB: [birth year]  Desiree Parker is a 77 y.o. year old female who sees Doreene Eland, MD for primary care. I spoke with  [name] by phone today.  What matters to the patients health and wellness today?  I recently had a conversation with Desiree Parker, and she mentioned that she hasn't been monitoring her blood pressure lately. However, she received a follow-up check from [facility], which revealed that her blood pressure was 144/78, and her blood sugar was 120. This information was shared with me by Dorie Rank, RN. Desiree Parker has denied experiencing any chest pains or swelling, but she has been experiencing shortness of breath, which she claims is not unusual for her.    Goals Addressed             This Visit's Progress    I want to manage and maintain my blood pressure.       Patient Goals/Self Care Activities: -Patient/Caregiver will self-administer medications as prescribed as evidenced by self-report/primary caregiver report  -Patient/Caregiver will attend all scheduled provider appointments as evidenced by clinician review of documented attendance to scheduled appointments and patient/caregiver report -Patient/Caregiver will call pharmacy for medication refills as evidenced by patient report and review of pharmacy fill history as appropriate -Patient/Caregiver will call provider office for new concerns or questions as evidenced by review of documented incoming telephone call notes and patient report -Patient/Caregiver verbalizes understanding of plan  -Checks BP and records as discussed -Follows a low sodium diet/DASH diet  -Continue her exercise -remember to check her blood presssure        SDOH assessments and interventions completed:  No     Care Coordination Interventions:  Yes, provided   Interventions Today    Flowsheet Row Most Recent Value  Chronic Disease    Chronic disease during today's visit Hypertension (HTN)  General Interventions   General Interventions Discussed/Reviewed General Interventions Reviewed  Nutrition Interventions   Nutrition Discussed/Reviewed Nutrition Discussed  Pharmacy Interventions   Pharmacy Dicussed/Reviewed Pharmacy Topics Discussed       Follow up plan: Follow up call scheduled for 08/25/22 11 am    Encounter Outcome:  Pt. Visit Completed   Juanell Fairly RN, BSN, [facility] Care Coordinator [facility]   Phone: [phone]

## 2022-07-22 NOTE — Progress Notes (Signed)
Patient did not present any SDOH insecurities at this time. It was recommended to contact her PCP about BP.

## 2022-07-23 NOTE — Patient Instructions (Signed)
Visit Information  Thank you for taking time to visit with me today. Please don't hesitate to contact me if I can be of assistance to you.   Following are the goals we discussed today:   Goals Addressed             This Visit's Progress    I want to manage and maintain my blood pressure.       Patient Goals/Self Care Activities: -Patient/Caregiver will self-administer medications as prescribed as evidenced by self-report/primary caregiver report  -Patient/Caregiver will attend all scheduled provider appointments as evidenced by clinician review of documented attendance to scheduled appointments and patient/caregiver report -Patient/Caregiver will call pharmacy for medication refills as evidenced by patient report and review of pharmacy fill history as appropriate -Patient/Caregiver will call provider office for new concerns or questions as evidenced by review of documented incoming telephone call notes and patient report -Patient/Caregiver verbalizes understanding of plan  -Checks BP and records as discussed -Follows a low sodium diet/DASH diet  -Continue her exercise -remember to check her blood presssure        Our next appointment is by telephone on 08/25/22 at 11 am  Please call the care guide team at [phone] if you need to cancel or reschedule your appointment.   If you are experiencing a Mental Health or Behavioral Health Crisis or need someone to talk to, please call 1-800-273-TALK (toll free, 24 hour hotline)  The patient verbalized understanding of instructions, educational materials, and care plan provided today.   Juanell Fairly RN, BSN, [facility] Care Coordinator [facility]   Phone: [phone]

## 2022-07-27 ENCOUNTER — Encounter: Payer: Self-pay | Admitting: Family Medicine

## 2022-07-27 ENCOUNTER — Encounter: Payer: Self-pay | Admitting: *Deleted

## 2022-07-27 ENCOUNTER — Ambulatory Visit (INDEPENDENT_AMBULATORY_CARE_PROVIDER_SITE_OTHER): Payer: 59 | Admitting: Family Medicine

## 2022-07-27 VITALS — BP 135/79 | HR 86 | Ht 59.0 in | Wt 203.2 lb

## 2022-07-27 DIAGNOSIS — I10 Essential (primary) hypertension: Secondary | ICD-10-CM | POA: Diagnosis not present

## 2022-07-27 DIAGNOSIS — E1165 Type 2 diabetes mellitus with hyperglycemia: Secondary | ICD-10-CM | POA: Diagnosis not present

## 2022-07-27 DIAGNOSIS — R0609 Other forms of dyspnea: Secondary | ICD-10-CM | POA: Diagnosis not present

## 2022-07-27 NOTE — Progress Notes (Signed)
    SUBJECTIVE:   CHIEF COMPLAINT / HPI:   Dypnes on exertion: She is compliant with her inhalers. Despite this, she gets short-winded even with the smallest exertion. She denies chest pain or SOB. No leg edema.  HTN/DM2:  Compliant with meds. Here for f/u.  PERTINENT  PMH / PSH: PMHx reviewed  OBJECTIVE:   BP 135/79   Pulse 86   Ht  (1.499 m)   Wt 203 lb 4 oz (92.2 kg)   SpO2 99%   BMI 41.05 kg/m   Physical Exam Vitals and nursing note reviewed.  Cardiovascular:     Rate and Rhythm: Normal rate and regular rhythm.     Pulses: Normal pulses.     Heart sounds: Normal heart sounds. No murmur heard. Pulmonary:     Effort: Pulmonary effort is normal. No respiratory distress.     Breath sounds: Normal breath sounds. No wheezing.  Musculoskeletal:     Right lower leg: No edema.     Left lower leg: No edema.     Comments: Sensory exam of the foot is normal, tested with the monofilament. Good pulses, no lesions or ulcers, good peripheral pulses.       ASSESSMENT/PLAN:   Dyspnea on exertion Remains a problem. ECHO done last year was reassuring with an EF of 55-60% She completed pulmonology evaluation and PFT with great adjustment to her inhaler with minimal improvement. ?? Diastolic CHF - no fluid overload. I have referred her to Cardiologist for further assessment. She is scheduled to see them in May - next month. F/U sooner if symptoms worsen.  HYPERTENSION, BENIGN SYSTEMIC Stable on Amlodipine 10 mg QD and Lisinopril 20 mg QD.  Diabetes mellitus, type II (HCC) Stable on Metformin. A1C is at goal. Foot exam significant for bunions without ulceration. F/U with podiatry for routine care.    Janit Pagan, MD [facility]

## 2022-07-27 NOTE — Assessment & Plan Note (Signed)
Remains a problem. ECHO done last year was reassuring with an EF of 55-60% She completed pulmonology evaluation and PFT with great adjustment to her inhaler with minimal improvement. ?? Diastolic CHF - no fluid overload. I have referred her to Cardiologist for further assessment. She is scheduled to see them in May - next month. F/U sooner if symptoms worsen.

## 2022-07-27 NOTE — Assessment & Plan Note (Signed)
Stable on Amlodipine 10 mg QD and Lisinopril 20 mg QD.

## 2022-07-27 NOTE — Assessment & Plan Note (Signed)
Stable on Metformin. A1C is at goal. Foot exam significant for bunions without ulceration. F/U with podiatry for routine care.

## 2022-07-27 NOTE — Progress Notes (Signed)
Pt seen at [facility] event on 07/22/22 where b/p was 144/78. Then pt seen by PCP on 07/27/22 where b/p now 135/79. PCP also completed DM follow up and foot check and pt has f/u appt with cardiology and ophthalmology. Pt also still being followed by Juanell Fairly, [facility] care management. No additional health equity team support indicated at this time.

## 2022-07-27 NOTE — Patient Instructions (Signed)
Hypertension, Adult ?Hypertension is another name for high blood pressure. High blood pressure forces your heart to work harder to pump blood. This can cause problems over time. ?There are two numbers in a blood pressure reading. There is a top number (systolic) over a bottom number (diastolic). It is best to have a blood pressure that is below 120/80. ?What are the causes? ?The cause of this condition is not known. Some other conditions can lead to high blood pressure. ?What increases the risk? ?Some lifestyle factors can make you more likely to develop high blood pressure: ?Smoking. ?Not getting enough exercise or physical activity. ?Being overweight. ?Having too much fat, sugar, calories, or salt (sodium) in your diet. ?Drinking too much alcohol. ?Other risk factors include: ?Having any of these conditions: ?Heart disease. ?Diabetes. ?High cholesterol. ?Kidney disease. ?Obstructive sleep apnea. ?Having a family history of high blood pressure and high cholesterol. ?Age. The risk increases with age. ?Stress. ?What are the signs or symptoms? ?High blood pressure may not cause symptoms. Very high blood pressure (hypertensive crisis) may cause: ?Headache. ?Fast or uneven heartbeats (palpitations). ?Shortness of breath. ?Nosebleed. ?Vomiting or feeling like you may vomit (nauseous). ?Changes in how you see. ?Very bad chest pain. ?Feeling dizzy. ?Seizures. ?How is this treated? ?This condition is treated by making healthy lifestyle changes, such as: ?Eating healthy foods. ?Exercising more. ?Drinking less alcohol. ?Your doctor may prescribe medicine if lifestyle changes do not help enough and if: ?Your top number is above 130. ?Your bottom number is above 80. ?Your personal target blood pressure may vary. ?Follow these instructions at home: ?Eating and drinking ? ?If told, follow the DASH eating plan. To follow this plan: ?Fill one half of your plate at each meal with fruits and vegetables. ?Fill one fourth of your plate  at each meal with whole grains. Whole grains include whole-wheat pasta, brown rice, and whole-grain bread. ?Eat or drink low-fat dairy products, such as skim milk or low-fat yogurt. ?Fill one fourth of your plate at each meal with low-fat (lean) proteins. Low-fat proteins include fish, chicken without skin, eggs, beans, and tofu. ?Avoid fatty meat, cured and processed meat, or chicken with skin. ?Avoid pre-made or processed food. ?Limit the amount of salt in your diet to less than 1,500 mg each day. ?Do not drink alcohol if: ?Your doctor tells you not to drink. ?You are pregnant, may be pregnant, or are planning to become pregnant. ?If you drink alcohol: ?Limit how much you have to: ?0-1 drink a day for women. ?0-2 drinks a day for men. ?Know how much alcohol is in your drink. In the [place] one drink equals one 12 oz bottle of beer (355 mL), one 5 oz glass of wine (148 mL), or one 1? oz glass of hard liquor (44 mL). ?Lifestyle ? ?Work with your doctor to stay at a healthy weight or to lose weight. Ask your doctor what the best weight is for you. ?Get at least 30 minutes of exercise that causes your heart to beat faster (aerobic exercise) most days of the week. This may include walking, swimming, or biking. ?Get at least 30 minutes of exercise that strengthens your muscles (resistance exercise) at least 3 days a week. This may include lifting weights or doing Pilates. ?Do not smoke or use any products that contain nicotine or tobacco. If you need help quitting, ask your doctor. ?Check your blood pressure at home as told by your doctor. ?Keep all follow-up visits. ?Medicines ?Take over-the-counter and prescription medicines   only as told by your doctor. Follow directions carefully. ?Do not skip doses of blood pressure medicine. The medicine does not work as well if you skip doses. Skipping doses also puts you at risk for problems. ?Ask your doctor about side effects or reactions to medicines that you should watch  for. ?Contact a doctor if: ?You think you are having a reaction to the medicine you are taking. ?You have headaches that keep coming back. ?You feel dizzy. ?You have swelling in your ankles. ?You have trouble with your vision. ?Get help right away if: ?You get a very bad headache. ?You start to feel mixed up (confused). ?You feel weak or numb. ?You feel faint. ?You have very bad pain in your: ?Chest. ?Belly (abdomen). ?You vomit more than once. ?You have trouble breathing. ?These symptoms may be an emergency. Get help right away. Call 911. ?Do not wait to see if the symptoms will go away. ?Do not drive yourself to the hospital. ?Summary ?Hypertension is another name for high blood pressure. ?High blood pressure forces your heart to work harder to pump blood. ?For most people, a normal blood pressure is less than 120/80. ?Making healthy choices can help lower blood pressure. If your blood pressure does not get lower with healthy choices, you may need to take medicine. ?This information is not intended to replace advice given to you by your health care provider. Make sure you discuss any questions you have with your health care provider. ?Document Revised: 01/15/2021 Document Reviewed: 01/15/2021 ?Elsevier Patient Education ? 2023 [employer] ? ?

## 2022-07-30 ENCOUNTER — Ambulatory Visit: Payer: Medicare Other | Admitting: Podiatry

## 2022-08-03 ENCOUNTER — Ambulatory Visit (INDEPENDENT_AMBULATORY_CARE_PROVIDER_SITE_OTHER): Payer: 59 | Admitting: Podiatry

## 2022-08-03 VITALS — BP 158/67

## 2022-08-03 DIAGNOSIS — B351 Tinea unguium: Secondary | ICD-10-CM

## 2022-08-03 DIAGNOSIS — E1151 Type 2 diabetes mellitus with diabetic peripheral angiopathy without gangrene: Secondary | ICD-10-CM

## 2022-08-03 DIAGNOSIS — M79675 Pain in left toe(s): Secondary | ICD-10-CM

## 2022-08-03 DIAGNOSIS — M79674 Pain in right toe(s): Secondary | ICD-10-CM | POA: Diagnosis not present

## 2022-08-03 NOTE — Progress Notes (Signed)
  Subjective:  Patient ID: Desiree Parker, female    DOB: [birth year],  MRN: [id number]  Desiree Parker presents to clinic today for: at risk foot care. Pt has h/o NIDDM with PAD and painful elongated mycotic toenails 1-5 bilaterally which are tender when wearing enclosed shoe gear. Pain is relieved with periodic professional debridement.  PCP is Doreene Eland, MD. Last visit was 07/27/2022.  No Known Allergies  Review of Systems: Negative except as noted in the HPI.  Objective: No changes noted in today's physical examination. Vitals:   08/03/22 0847 08/03/22 0901  BP: (!) 169/81 (!) 158/67   SULMA Parker is a pleasant 77 y.o. female in NAD. AAO x 3.  Vascular Examination: CFT <3 seconds b/l. DP pulses faintly palpable b/l. PT pulses nonpalpable b/l. Skin temperature gradient warm to warm b/l. No pain with calf compression. No ischemia or gangrene. No cyanosis or clubbing noted b/l.    Neurological Examination: Sensation grossly intact b/l with 10 gram monofilament. Vibratory sensation intact b/l.   Dermatological Examination: Pedal skin warm and supple b/l.   No open wounds. No interdigital macerations.  Toenails 1-5 b/l thick, discolored, elongated with subungual debris and pain on dorsal palpation.    No hyperkeratotic nor porokeratotic lesions present on today's visit.  Musculoskeletal Examination: Muscle strength 5/5 to b/l LE. HAV with bunion deformity noted b/l LE. Utilizes cane for ambulation assistance.  Radiographs: None  Last A1c:      Latest Ref Rng & Units 06/29/2022    8:37 AM 10/23/2021    8:30 AM  Hemoglobin A1C  Hemoglobin-A1c 0.0 - 7.0 % 6.9  6.5         Latest Ref Rng & Units 06/29/2022    8:37 AM 10/23/2021    8:30 AM  Hemoglobin A1C  Hemoglobin-A1c 0.0 - 7.0 % 6.9  6.5    Assessment/Plan: 1. Pain due to onychomycosis of toenails of both feet   2. Type II diabetes mellitus with peripheral circulatory disorder [facility])    -Patient's family  member present. All questions/concerns addressed on today's visit. -Continue foot and shoe inspections daily. Monitor blood glucose per PCP/Endocrinologist's recommendations. -Patient to continue soft, supportive shoe gear daily. -Toenails 1-5 b/l were debrided in length and girth with sterile nail nippers and dremel without iatrogenic bleeding.  -Patient/POA to call should there be question/concern in the interim.   Return in about 3 months (around 11/02/2022).  Freddie Breech, DPM

## 2022-08-06 ENCOUNTER — Encounter: Payer: Self-pay | Admitting: Podiatry

## 2022-08-07 NOTE — Addendum Note (Signed)
Addended by: Janit Pagan T on: 08/07/2022 06:12 AM   Modules accepted: Orders

## 2022-08-09 ENCOUNTER — Telehealth: Payer: Self-pay

## 2022-08-09 NOTE — Telephone Encounter (Signed)
Spoke with patients son Marcy Salvo. Informed of ECHO on May 15th at 1:55pm. Aquilla Solian, CMA

## 2022-08-09 NOTE — Telephone Encounter (Signed)
Attempted to reach patient. No answer. LVM informing of ECHO at [facility] May 15th at 1:55pm. Aquilla Solian, CMA

## 2022-08-09 NOTE — Telephone Encounter (Signed)
-----   Message from Doreene Eland, MD sent at 08/07/2022  6:12 AM EDT ----- Hello team,  Please contact patient and help her schedule ECHO within this week if possible. Thanks.  Eniola

## 2022-08-13 ENCOUNTER — Ambulatory Visit
Admission: RE | Admit: 2022-08-13 | Discharge: 2022-08-13 | Disposition: A | Discharge status: Home / Self Care | Payer: 59 | Source: Ambulatory Visit | Attending: Family Medicine | Admitting: Family Medicine

## 2022-08-13 DIAGNOSIS — Z1231 Encounter for screening mammogram for malignant neoplasm of breast: Secondary | ICD-10-CM | POA: Diagnosis not present

## 2022-08-14 ENCOUNTER — Other Ambulatory Visit: Payer: Self-pay | Admitting: Family Medicine

## 2022-08-14 DIAGNOSIS — J4489 Other specified chronic obstructive pulmonary disease: Secondary | ICD-10-CM

## 2022-08-17 ENCOUNTER — Ambulatory Visit: Payer: 59 | Admitting: Internal Medicine

## 2022-08-17 NOTE — Progress Notes (Deleted)
Cardiology Office Note:    Date:  08/17/2022   ID:  Desiree Parker, DOB 1946-01-25, MRN [id number]  PCP:  Desiree Eland, MD   [facility] Providers Cardiologist:  None { Click to update primary MD,subspecialty MD or APP then REFRESH:1}    Referring MD: Desiree Eland, MD   No chief complaint on file. ***  History of Present Illness:    Desiree Parker is a 77 y.o. female with a hx of HTN, DM2, COPD, smoker ***, referral for HTN. She was referred to cardiology. She was seen at a Health equity event on 07/22/22 where b/p was 144/78. Then pt seen by PCP on 07/27/22 where b/p now 135/79.   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HCC) 02/04/2004   ABI 0.84   CAP (community acquired pneumonia) 10/2015   Cataract    Chest pain 09/11/2015   COPD (chronic obstructive pulmonary disease) (HCC)    Degenerative disk disease 10/2005   Lumbar, anterolithesis, L4-5,5-S1   DEVELOPMENTAL READING DISORDER UNSPECIFIED 07/18/2007   Qualifier: Diagnosis of  By: Sheffield Slider MD, Wayne     Diabetes mellitus without complication [facility])    Estrogen deficiency 09/22/2015   GERD (gastroesophageal reflux disease)    Headache 03/11/2016   Hilar mass    CT Chest 10/2015 IMPRESSION: No demonstrable pulmonary embolus.  There is infiltrate consistent with pneumonia in portions of the right lower lobe. There is patchy atelectasis in the left lower lobe. There is no appreciable adenopathy. The prominence in the right hilum noted previously is apparently due to vascular prominence as opposed to mass or adenopathy. 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     Allergies:   Patient has no known allergies.   Social History   Socioeconomic History   Marital status: Single    Spouse name: Not on file   Number of children: 1   Years of education: 12   Highest education level: Not on file  Occupational History   Not on file  Tobacco Use   Smoking status: Former    Packs/day: 2.00    Years: 27.00    Additional pack years: 0.00    Total pack years: 54.00    Types: Cigarettes    Quit date: 09/24/1988    Years since quitting: 33.9    Passive exposure: Past   Smokeless tobacco: Former    Types: Snuff    Quit date: 07/05/2018  Substance and Sexual Activity   Alcohol use: Yes    Alcohol/week: 2.0 standard drinks of alcohol    Types: 2 Standard drinks or equivalent per week   Drug use: No   Sexual activity: Not Currently    Birth control/protection: Surgical  Other Topics Concern   Not on file  Social History Narrative   Lives alone, retired Chief Financial Officer, [name] in [place]  Social Determinants of Health   Financial Resource Strain: Low Risk  (02/22/2022)   Overall Financial Resource Strain (CARDIA)    Difficulty of Paying Living Expenses: Not very hard  Food Insecurity: No Food Insecurity (07/22/2022)   Hunger Vital Sign    Worried About Running Out of Food in the Last Year: Never true    Ran Out of Food in the Last Year: Never true  Transportation Needs: No Transportation Needs (07/22/2022)   PRAPARE - Administrator, Civil Service (Medical): No    Lack of Transportation (Non-Medical): No  Physical Activity: Inactive (02/22/2022)   Exercise Vital Sign    Days of Exercise per Week: 0 days    Minutes of Exercise per Session: 0 min  Stress: No Stress Concern Present (02/22/2022)   [employer] of Occupational Health - Occupational  Stress Questionnaire    Feeling of Stress : Not at all  Social Connections: Not on file     Family History: The patient's ***family history includes Asthma in her son; Diabetes in her father and mother; Hypertension in her mother and sister; Lung cancer in her mother. There is no history of Colon polyps, Esophageal cancer, Stomach cancer, or Rectal cancer.  ROS:   Please see the history of present illness.    *** All other systems reviewed and are negative.  EKGs/Labs/Other Studies Reviewed:    The following studies were reviewed today: ***  EKG:  EKG is *** ordered today.  The ekg ordered today demonstrates ***  Recent Labs: 10/28/2021: BUN 13; Creatinine, Ser 0.83; Hemoglobin 12.6; Platelets 269.0; Potassium 4.1; Pro B Natriuretic peptide (BNP) 64.0; Sodium 139; TSH 3.16  Recent Lipid Panel    Component Value Date/Time   CHOL 126 06/30/2021 1036   TRIG 89 06/30/2021 1036   HDL 65 06/30/2021 1036   CHOLHDL 1.9 06/30/2021 1036   CHOLHDL 2.5 10/27/2015 0437   VLDL 10 10/27/2015 0437   LDLCALC 44 06/30/2021 1036   LDLDIRECT 132 (H) 08/01/2012 1629     Risk Assessment/Calculations:   {Does this patient have ATRIAL FIBRILLATION?[phone]}  No BP recorded.  {Refresh Note OR Click here to enter BP  :1}***         Physical Exam:    VS:  There were no vitals taken for this visit.    Wt Readings from Last 3 Encounters:  07/27/22 203 lb 4 oz (92.2 kg)  06/29/22 204 lb 3.2 oz (92.6 kg)  05/28/22 203 lb 6.4 oz (92.3 kg)     GEN: *** Well nourished, well developed in no acute distress HEENT: Normal NECK: No JVD; No carotid bruits LYMPHATICS: No lymphadenopathy CARDIAC: ***RRR, no murmurs, rubs, gallops RESPIRATORY:  Clear to auscultation without rales, wheezing or rhonchi  ABDOMEN: Soft, non-tender, non-distended MUSCULOSKELETAL:  No edema; No deformity  SKIN: Warm and dry NEUROLOGIC:  Alert and oriented x 3 PSYCHIATRIC:  Normal affect   ASSESSMENT:    No  diagnosis found. PLAN:    In order of problems listed above:  ***      {Are you ordering a CV Procedure (e.g. stress test, cath, DCCV, TEE, etc)?   Press F2        :[id number]}    Medication Adjustments/Labs and Tests Ordered: Current medicines are reviewed at length with the patient today.  Concerns regarding medicines are outlined above.  No orders of the defined types were placed in this encounter.  No orders of the defined types were placed in this encounter.  There are no Patient Instructions on file for this visit.   Signed, Maisie Fus, MD  08/17/2022 3:12 PM    [facility]

## 2022-08-19 ENCOUNTER — Encounter: Payer: Self-pay | Admitting: Internal Medicine

## 2022-08-19 ENCOUNTER — Ambulatory Visit: Payer: 59 | Attending: Internal Medicine | Admitting: Internal Medicine

## 2022-08-19 DIAGNOSIS — R0602 Shortness of breath: Secondary | ICD-10-CM | POA: Diagnosis not present

## 2022-08-19 MED ORDER — FUROSEMIDE 20 MG PO TABS: 20.0000 mg | ORAL_TABLET | Freq: Every day | ORAL | 3 refills | Status: DC

## 2022-08-19 NOTE — Progress Notes (Signed)
Cardiology Office Note:    Date:  08/19/2022   ID:  Desiree Parker, DOB 05-30-1945, MRN [id number]  PCP:  Doreene Eland, MD   [facility] Providers Cardiologist:  None     Referring MD: Doreene Eland, MD   No chief complaint on file. SOB  History of Present Illness:    Desiree Parker is a 77 y.o. female with a hx of HTN, HLD, T2DM, COPD, referral 2/2 high blood pressure. She was at a health equity event and her BP was 144/78 mmHg. BP better controlled with her PCP. She was referred here. Her echo from March of last year showed EF is normal, nl RV, no valve dx. She had a lexiscan SPECT in 2021 that was normal. Echo at that time 04/2019 was normal. Echo in 2017 was also unremarkable.  Today, she's here with her son who helps take care of her. She notes SOB with limited walking, for a few years. She notes it is now worse. Notes SOB with 50 feet or so.  No chest pressure. Her son notes wheezing. States started using albuterol. Helps to some degree. Notes every once in a while she gets leg swelling. No orthopnea/PND. Has not tried a diuretic.  Smoked 16 to 50s She checks her blood pressures sometimes.    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HCC) 02/04/2004   ABI 0.84   CAP (community acquired pneumonia) 10/2015   Cataract    Chest pain 09/11/2015   COPD (chronic obstructive pulmonary disease) (HCC)    Degenerative disk disease 10/2005   Lumbar, anterolithesis, L4-5,5-S1   DEVELOPMENTAL READING DISORDER UNSPECIFIED 07/18/2007   Qualifier: Diagnosis of  By: Sheffield Slider MD, Wayne     Diabetes mellitus without complication [facility])    Estrogen deficiency 09/22/2015   GERD (gastroesophageal reflux disease)    Headache 03/11/2016   Hilar mass    CT Chest 10/2015 IMPRESSION: No demonstrable pulmonary embolus.  There is infiltrate consistent with pneumonia in portions of the right lower lobe. There is patchy atelectasis in the left lower  lobe. There is no appreciable adenopathy. The prominence in the right hilum noted previously is apparently due to vascular prominence as opposed to mass or adenopathy. 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     albuterol (VENTOLIN HFA) 108 (90 Base) MCG/ACT inhaler INHALE 2 PUFFS INTO THE LUNGS EVERY 6 HOURS AS NEEDED FOR WHEEZING OR SHORTNESS OF BREATH 25.5 g 6   alendronate (FOSAMAX) 70 MG tablet TAKE 1 TABLET BY MOUTH EVERY 7 DAYS WITH A FULL GLASS OF WATER AND ON AN EMPTY STOMACH 12 tablet 2   amLODipine (NORVASC) 10 MG tablet Take 1 tablet (10 mg total) by mouth daily. 90 tablet 1   aspirin EC 81 MG tablet Take 1 tablet (81 mg total) by mouth daily. Swallow  whole. 30 tablet 11   atorvastatin (LIPITOR) 20 MG tablet TAKE 1 TABLET(20 MG) BY MOUTH DAILY AT 6 PM 90 tablet 1   Blood Glucose Monitoring Suppl (ONETOUCH VERIO) w/Device KIT To check blood sugar once daily. E11.9 1 kit 0   BREO ELLIPTA 100-25 MCG/ACT AEPB INHALE 1 PUFF INTO THE LUNGS DAILY 60 each 1   calcium-vitamin D (OSCAL WITH D) 500-200 MG-UNIT tablet Take 1 tablet by mouth 2 (two) times daily. 60 tablet 4   glucose blood (ONETOUCH VERIO) test strip TO CHECK BLOOD SUGAR ONCE DAILY 100 strip 3   Lancet Devices (ONE TOUCH DELICA LANCING DEV) MISC To check blood sugar once daily. E11.9 1 each 0   lisinopril (ZESTRIL) 20 MG tablet TAKE 1 TABLET(20 MG) BY MOUTH DAILY 90 tablet 1   metFORMIN  (GLUCOPHAGE-XR) 500 MG 24 hr tablet TAKE 1 TABLET(500 MG) BY MOUTH EVERY OTHER DAY 45 tablet 1   omeprazole (PRILOSEC) 20 MG capsule TAKE ONE CAPSULE BY MOUTH EVERY DAY AS NEEDED. PROLONGED USE. MAY CAUSE RENAL IMPAIRMENT 90 capsule 1   tiotropium (SPIRIVA HANDIHALER) 18 MCG inhalation capsule INHALE THE CONTENT OF 1 CAPSULE VIA HANDIHALER BY MOUTH EVERY MORNING 90 capsule 2   Blood Glucose Monitoring Suppl (ONETOUCH VERIO REFLECT) w/Device KIT 1 Units by Does not apply route 3 (three) times daily. (Patient not taking: Reported on 08/19/2022) 1 kit 0   Lancets (ONETOUCH DELICA PLUS LANCET33G) MISC TO CHECK BLOOD SUGAR ONCE DAILY (Patient not taking: Reported on 08/19/2022) 100 each 3   No current facility-administered medications on file prior to visit.     Allergies:   Patient has no known allergies.   Social History   Socioeconomic History   Marital status: Single    Spouse name: Not on file   Number of children: 1   Years of education: 12   Highest education level: Not on file  Occupational History   Not on file  Tobacco Use   Smoking status: Former    Packs/day: 2.00    Years: 27.00    Additional pack years: 0.00    Total pack years: 54.00    Types: Cigarettes    Quit date: 09/24/1988    Years since quitting: 33.9    Passive exposure: Past   Smokeless tobacco: Former    Types: Snuff    Quit date: 07/05/2018  Substance and Sexual Activity   Alcohol use: Yes    Alcohol/week: 2.0 standard drinks of alcohol    Types: 2 Standard drinks or equivalent per week   Drug use: No   Sexual activity: Not Currently    Birth control/protection: Surgical  Other Topics Concern   Not on file  Social History Narrative   Lives alone, retired Hospital doctor   Son, Desiree Parker in [place]            Social Determinants of Health   Financial Resource Strain: Low Risk  (02/22/2022)   Overall Financial Resource Strain (CARDIA)    Difficulty of Paying Living Expenses: Not very hard  Food  Insecurity: No Food Insecurity (07/22/2022)   Hunger Vital Sign    Worried About Running Out of Food in the Last Year: Never true    Ran Out of Food in the Last Year: Never true  Transportation Needs: No Transportation Needs (07/22/2022)   PRAPARE - Administrator, Civil Service (Medical): No    Lack of Transportation (Non-Medical): No  Physical Activity: Inactive (02/22/2022)   Exercise Vital Sign  Days of Exercise per Week: 0 days    Minutes of Exercise per Session: 0 min  Stress: No Stress Concern Present (02/22/2022)   [employer] of Occupational Health - Occupational Stress Questionnaire    Feeling of Stress : Not at all  Social Connections: Not on file     Family History: The patient's family history includes Asthma in her son; Diabetes in her father and mother; Hypertension in her mother and sister; Lung cancer in her mother. There is no history of Colon polyps, Esophageal cancer, Stomach cancer, or Rectal cancer.  ROS:   Please see the history of present illness.     All other systems reviewed and are negative.  EKGs/Labs/Other Studies Reviewed:    The following studies were reviewed today:   EKG:  EKG is  ordered today.  The ekg ordered today demonstrates   08/19/2022- NSR, LAFB  Recent Labs: 10/28/2021: BUN 13; Creatinine, Ser 0.83; Hemoglobin 12.6; Platelets 269.0; Potassium 4.1; Pro B Natriuretic peptide (BNP) 64.0; Sodium 139; TSH 3.16  Recent Lipid Panel    Component Value Date/Time   CHOL 126 06/30/2021 1036   TRIG 89 06/30/2021 1036   HDL 65 06/30/2021 1036   CHOLHDL 1.9 06/30/2021 1036   CHOLHDL 2.5 10/27/2015 0437   VLDL 10 10/27/2015 0437   LDLCALC 44 06/30/2021 1036   LDLDIRECT 132 (H) 08/01/2012 1629     Risk Assessment/Calculations:     Physical Exam:    VS:  Vitals:   08/19/22 1151  BP: (!) 142/64  Pulse: (!) 104  SpO2: 95%      Wt Readings from Last 3 Encounters:  07/27/22 203 lb 4 oz (92.2 kg)  06/29/22 204 lb  3.2 oz (92.6 kg)  05/28/22 203 lb 6.4 oz (92.3 kg)     GEN:  Well nourished, well developed in no acute distress HEENT: Normal NECK: No JVD; No carotid bruits CARDIAC: RRR, no murmurs, rubs, gallops RESPIRATORY:  Clear to auscultation without rales, wheezing or rhonchi  ABDOMEN: Soft, non-tender, non-distended MUSCULOSKELETAL:  No edema; No deformity  SKIN: Warm and dry NEUROLOGIC:  Alert and oriented x 3 PSYCHIATRIC:  Normal affect   ASSESSMENT:   Dyspnea: Cause is multifactorial. We discussed weight is contributing. Her son noted wheezing, may consider PFTs. Otherwise, her echo did not show signs of significant LVEDP. She may have some increased LAP with ambulation. Can trial lasix and see if this helps her. We discussed obesity being a significant factor. We discussed the importance of diet and goal is to reduce sugary snacks. Aim for meals with lean meats or vegetables. - can start lasix 20 mg daily  HTN: continue norvasc 10 mg daily. Continue lisinopril 20 mg daily.  HLD/DM2: continue lipitor. Goal A1c < 7. PLAN:    In order of problems listed above:  Lasix 20 mg daily BMET, BNP Follow up 3 months      Medication Adjustments/Labs and Tests Ordered: Current medicines are reviewed at length with the patient today.  Concerns regarding medicines are outlined above.  No orders of the defined types were placed in this encounter.  No orders of the defined types were placed in this encounter.   There are no Patient Instructions on file for this visit.   Signed, Maisie Fus, MD  08/19/2022 11:21 AM    [facility]

## 2022-08-19 NOTE — Patient Instructions (Signed)
Medication Instructions:  START furosemide (lasix) 20mg  daily  *If you need a refill on your cardiac medications before your next appointment, please call your pharmacy*   Lab Work: Non-Fasting BMET and BNP in 2 weeks  If you have labs (blood work) drawn today and your tests are completely normal, you will receive your results only by: MyChart Message (if you have MyChart) OR A paper copy in the mail If you have any lab test that is abnormal or we need to change your treatment, we will call you to review the results.   Follow-Up: At [facility], you and your health needs are our priority.  As part of our continuing mission to provide you with exceptional heart care, we have created designated Provider Care Teams.  These Care Teams include your primary Cardiologist (physician) and Advanced Practice Providers (APPs -  Physician Assistants and Nurse Practitioners) who all work together to provide you with the care you need, when you need it.  We recommend signing up for the patient portal called "MyChart".  Sign up information is provided on this After Visit Summary.  MyChart is used to connect with patients for Virtual Visits (Telemedicine).  Patients are able to view lab/test results, encounter notes, upcoming appointments, etc.  Non-urgent messages can be sent to your provider as well.   To learn more about what you can do with MyChart, go to [email or web address].    Your next appointment:     3 months with Dr. Wyline Mood

## 2022-08-20 NOTE — Addendum Note (Signed)
Addended by: Derenda Fennel on: 08/20/2022 05:31 PM   Modules accepted: Orders

## 2022-08-25 ENCOUNTER — Telehealth: Payer: Self-pay | Admitting: Family Medicine

## 2022-08-25 ENCOUNTER — Ambulatory Visit (HOSPITAL_COMMUNITY)
Admission: RE | Admit: 2022-08-25 | Discharge: 2022-08-25 | Disposition: A | Discharge status: Home / Self Care | Payer: 59 | Source: Ambulatory Visit | Attending: Family Medicine | Admitting: Family Medicine

## 2022-08-25 ENCOUNTER — Ambulatory Visit: Payer: Self-pay

## 2022-08-25 DIAGNOSIS — R0609 Other forms of dyspnea: Secondary | ICD-10-CM | POA: Diagnosis not present

## 2022-08-25 DIAGNOSIS — E785 Hyperlipidemia, unspecified: Secondary | ICD-10-CM | POA: Diagnosis not present

## 2022-08-25 DIAGNOSIS — I509 Heart failure, unspecified: Secondary | ICD-10-CM | POA: Diagnosis not present

## 2022-08-25 DIAGNOSIS — E119 Type 2 diabetes mellitus without complications: Secondary | ICD-10-CM | POA: Insufficient documentation

## 2022-08-25 DIAGNOSIS — I110 Hypertensive heart disease with heart failure: Secondary | ICD-10-CM | POA: Insufficient documentation

## 2022-08-25 DIAGNOSIS — J449 Chronic obstructive pulmonary disease, unspecified: Secondary | ICD-10-CM | POA: Diagnosis not present

## 2022-08-25 LAB — ECHOCARDIOGRAM COMPLETE
Area-P 1/2: 3.39 cm2
Calc EF: 67.1 %
S' Lateral: 1.8 cm
Single Plane A2C EF: 68.8 %
Single Plane A4C EF: 66.9 %

## 2022-08-25 NOTE — Telephone Encounter (Signed)
ECHO report discussed. EF improved. +GIDD : ?? Related to her respiratory symptoms. Was recently seen by cardiology who recommended Lasix. Per her son, she is compliant with this. Close f/u discussed.   He was appreciative of the call.

## 2022-08-25 NOTE — Progress Notes (Signed)
Echocardiogram 2D Echocardiogram has been performed.  Toni Amend 08/25/2022, 2:25 PM

## 2022-08-26 NOTE — Patient Instructions (Signed)
Visit Information  Thank you for taking time to visit with me today. Please don't hesitate to contact me if I can be of assistance to you.   Following are the goals we discussed today:   Goals Addressed             This Visit's Progress    I want to manage and maintain my blood pressure.       Patient Goals/Self Care Activities: -Patient/Caregiver will self-administer medications as prescribed as evidenced by self-report/primary caregiver report  -Patient/Caregiver will attend all scheduled provider appointments as evidenced by clinician review of documented attendance to scheduled appointments and patient/caregiver report -Patient/Caregiver will call pharmacy for medication refills as evidenced by patient report and review of pharmacy fill history as appropriate -Patient/Caregiver will call provider office for new concerns or questions as evidenced by review of documented incoming telephone call notes and patient report -Patient/Caregiver verbalizes understanding of plan  -Checks BP and records as discussed -Follows a low sodium diet/DASH diet  -Continue her exercise/ walking        Our next appointment is by telephone on 09/29/22 at 10 am  Please call the care guide team at [phone] if you need to cancel or reschedule your appointment.   If you are experiencing a Mental Health or Behavioral Health Crisis or need someone to talk to, please call 1-800-273-TALK (toll free, 24 hour hotline)  The patient verbalized understanding of instructions, educational materials, and care plan provided today.  Juanell Fairly RN, BSN, [facility] Care Coordinator [facility]   Phone: [phone]

## 2022-08-26 NOTE — Patient Outreach (Signed)
  Care Coordination   Follow Up Visit Note 08/25/2022 Name: Desiree Parker MRN: [id number] DOB: 1945/06/21  Desiree Parker is a 77 y.o. year old female who sees Desiree Eland, MD for primary care. I spoke with  Desiree Parker by phone today.  What matters to the patients health and wellness today?  Desiree Parker reported feeling well and mentioned that her blood pressure tends to rise when she visits the doctor. She weighed herself and found that she was 204 lbs. She had some swelling in her feet, but it has improved. Currently, she is taking Lasix and increasing her water intake. She denied experiencing any headaches or chest pain.     Goals Addressed             This Visit's Progress    I want to manage and maintain my blood pressure.       Patient Goals/Self Care Activities: -Patient/Caregiver will self-administer medications as prescribed as evidenced by self-report/primary caregiver report  -Patient/Caregiver will attend all scheduled provider appointments as evidenced by clinician review of documented attendance to scheduled appointments and patient/caregiver report -Patient/Caregiver will call pharmacy for medication refills as evidenced by patient report and review of pharmacy fill history as appropriate -Patient/Caregiver will call provider office for new concerns or questions as evidenced by review of documented incoming telephone call notes and patient report -Patient/Caregiver verbalizes understanding of plan  -Checks BP and records as discussed -Follows a low sodium diet/DASH diet  -Continue her exercise/ walking        SDOH assessments and interventions completed:  No     Care Coordination Interventions:  Yes, provided   Interventions Today    Flowsheet Row Most Recent Value  Chronic Disease   Chronic disease during today's visit Hypertension (HTN)  General Interventions   General Interventions Discussed/Reviewed General Interventions Reviewed  Exercise  Interventions   Exercise Discussed/Reviewed Physical Activity  Physical Activity Discussed/Reviewed Physical Activity Discussed  Nutrition Interventions   Nutrition Discussed/Reviewed Nutrition Discussed  Pharmacy Interventions   Pharmacy Dicussed/Reviewed Pharmacy Topics Discussed        Follow up plan: Follow up call scheduled for 09/29/22  10 am    Encounter Outcome:  Pt. Visit Completed   Desiree Fairly RN, BSN, [facility] Care Coordinator [facility]   Phone: [phone]

## 2022-09-20 ENCOUNTER — Other Ambulatory Visit: Payer: Self-pay | Admitting: Family Medicine

## 2022-09-20 DIAGNOSIS — Z91148 Patient's other noncompliance with medication regimen for other reason: Secondary | ICD-10-CM

## 2022-09-29 ENCOUNTER — Ambulatory Visit: Payer: Self-pay

## 2022-09-29 NOTE — Patient Instructions (Signed)
Visit Information  Thank you for taking time to visit with me today. Please don't hesitate to contact me if I can be of assistance to you.   Following are the goals we discussed today:   Goals Addressed             This Visit's Progress    I want to manage and maintain my blood pressure.       Patient Goals/Self Care Activities: -Patient/Caregiver will self-administer medications as prescribed as evidenced by self-report/primary caregiver report  -Patient/Caregiver will attend all scheduled provider appointments as evidenced by clinician review of documented attendance to scheduled appointments and patient/caregiver report -Patient/Caregiver will call pharmacy for medication refills as evidenced by patient report and review of pharmacy fill history as appropriate -Patient/Caregiver will call provider office for new concerns or questions as evidenced by review of documented incoming telephone call notes and patient report -Patient/Caregiver verbalizes understanding of plan  -Please remember to Checks BP and record as discussed -Follows a low sodium diet/DASH diet  -Continue her exercise/ walking        Our next appointment is by telephone on 11/03/22 at 10 am  Please call the care guide team at [phone] if you need to cancel or reschedule your appointment.   If you are experiencing a Mental Health or Behavioral Health Crisis or need someone to talk to, please call 1-800-273-TALK (toll free, 24 hour hotline)  The patient verbalized understanding of instructions, educational materials, and care plan provided today.    Juanell Fairly RN, BSN, [facility] Care Coordinator [facility]   Phone: [phone]

## 2022-09-29 NOTE — Patient Outreach (Signed)
  Care Coordination   Follow Up Visit Note   09/29/2022 Name: Desiree Parker MRN: [id number] DOB: Sep 14, 1945  Desiree Parker is a 77 y.o. year old female who sees Desiree Eland, MD for primary care. I spoke with  Desiree Parker by phone today.  What matters to the patients health and wellness today?  Desiree Parker feels happy about her upcoming move, but her blood pressure has fluctuated due to stress. My blood pressure has been 145 systolic, and I can't recall the diastolic reading. She denies experiencing any chest pain or headaches. She believes that her stress levels will improve once she completes the move.     Goals Addressed             This Visit's Progress    I want to manage and maintain my blood pressure.       Patient Goals/Self Care Activities: -Patient/Caregiver will self-administer medications as prescribed as evidenced by self-report/primary caregiver report  -Patient/Caregiver will attend all scheduled provider appointments as evidenced by clinician review of documented attendance to scheduled appointments and patient/caregiver report -Patient/Caregiver will call pharmacy for medication refills as evidenced by patient report and review of pharmacy fill history as appropriate -Patient/Caregiver will call provider office for new concerns or questions as evidenced by review of documented incoming telephone call notes and patient report -Patient/Caregiver verbalizes understanding of plan  -Please remember to Checks BP and record as discussed -Follows a low sodium diet/DASH diet  -Continue her exercise/ walking        SDOH assessments and interventions completed:  No     Care Coordination Interventions:  Yes, provided   Interventions Today    Flowsheet Row Most Recent Value  Chronic Disease   Chronic disease during today's visit Hypertension (HTN)  General Interventions   General Interventions Discussed/Reviewed General Interventions Discussed  Exercise  Interventions   Exercise Discussed/Reviewed Physical Activity  Physical Activity Discussed/Reviewed Physical Activity Reviewed  Education Interventions   Education Provided Provided Education  Pharmacy Interventions   Pharmacy Dicussed/Reviewed Pharmacy Topics Discussed  Safety Interventions   Safety Discussed/Reviewed Safety Discussed          Follow up plan: Follow up call scheduled for 11/03/22 10  am    Encounter Outcome:  Pt. Visit Completed   Juanell Fairly RN, BSN, [facility] Care Coordinator [facility]   Phone: [phone]

## 2022-11-02 ENCOUNTER — Ambulatory Visit: Payer: 59 | Admitting: Podiatry

## 2022-11-03 ENCOUNTER — Ambulatory Visit: Payer: Self-pay

## 2022-11-03 NOTE — Patient Instructions (Signed)
Visit Information  Thank you for taking time to visit with me today. Please don't hesitate to contact me if I can be of assistance to you.   Following are the goals we discussed today:   Goals Addressed             This Visit's Progress    I want to manage and maintain my blood pressure.       Patient Goals/Self Care Activities: -Patient/Caregiver will self-administer medications as prescribed as evidenced by self-report/primary caregiver report  -Patient/Caregiver will attend all scheduled provider appointments as evidenced by clinician review of documented attendance to scheduled appointments and patient/caregiver report -Patient/Caregiver will call pharmacy for medication refills as evidenced by patient report and review of pharmacy fill history as appropriate -Patient/Caregiver will call provider office for new concerns or questions as evidenced by review of documented incoming telephone call notes and patient report -Patient/Caregiver verbalizes understanding of plan  -Please remember to Checks BP and record as discussed -Follows a low sodium diet/DASH diet  -Continue her exercise/ walking        Our next appointment is by telephone on 01/04/23 at 10 am  Please call the care guide team at [phone] if you need to cancel or reschedule your appointment.   If you are experiencing a Mental Health or Behavioral Health Crisis or need someone to talk to, please call 1-800-273-TALK (toll free, 24 hour hotline)  The patient verbalized understanding of instructions, educational materials, and care plan provided today.    Juanell Fairly RN, BSN, [facility] Care Coordinator [facility]   Phone: [phone]

## 2022-11-03 NOTE — Patient Outreach (Signed)
  Care Coordination   Follow Up Visit Note   11/03/2022 Name: Desiree Parker MRN: [id number] DOB: [birth year]  Desiree Parker is a 77 y.o. year old female who sees Doreene Eland, MD for primary care. I spoke with  [name] by phone today.  What matters to the patients health and wellness today?  Mrs. Solan understanding of her health is crucial to her successful management. She is feeling well and has not been checking her blood pressure, but she denies headaches, chest pain, or swelling. I emphasized the importance of monitoring her blood pressure, and she clearly expressed her understanding. I also clarified that it would be easier to provide adequate care if she monitored her blood pressure.    Goals Addressed             This Visit's Progress    I want to manage and maintain my blood pressure.       Patient Goals/Self Care Activities: -Patient/Caregiver will self-administer medications as prescribed as evidenced by self-report/primary caregiver report  -Patient/Caregiver will attend all scheduled provider appointments as evidenced by clinician review of documented attendance to scheduled appointments and patient/caregiver report -Patient/Caregiver will call pharmacy for medication refills as evidenced by patient report and review of pharmacy fill history as appropriate -Patient/Caregiver will call provider office for new concerns or questions as evidenced by review of documented incoming telephone call notes and patient report -Patient/Caregiver verbalizes understanding of plan  -Please remember to Checks BP and record as discussed -Follows a low sodium diet/DASH diet  -Continue her exercise/ walking        SDOH assessments and interventions completed:  No     Care Coordination Interventions:  Yes, provided   Interventions Today    Flowsheet Row Most Recent Value  Chronic Disease   Chronic disease during today's visit Hypertension (HTN)  General Interventions    General Interventions Discussed/Reviewed General Interventions Reviewed  Education Interventions   Education Provided Provided Education  Safety Interventions   Safety Discussed/Reviewed Safety Reviewed       Follow up plan: Follow up call scheduled for 01/04/23 10 am    Encounter Outcome:  Pt. Visit Completed   Juanell Fairly RN, BSN, [facility] Care Coordinator [facility]   Phone: [phone]

## 2022-11-08 ENCOUNTER — Ambulatory Visit: Payer: 59 | Admitting: Podiatry

## 2022-11-19 ENCOUNTER — Ambulatory Visit: Payer: 59 | Attending: Internal Medicine | Admitting: Internal Medicine

## 2022-11-19 VITALS — BP 126/64 | HR 94 | Ht 59.0 in | Wt 202.6 lb

## 2022-11-19 DIAGNOSIS — R0609 Other forms of dyspnea: Secondary | ICD-10-CM | POA: Diagnosis not present

## 2022-11-19 NOTE — Patient Instructions (Signed)
Medication Instructions:  Your physician recommends that you continue on your current medications as directed. Please refer to the Current Medication list given to you today.  *If you need a refill on your cardiac medications before your next appointment, please call your pharmacy*   Follow-Up: At [facility], you and your health needs are our priority.  As part of our continuing mission to provide you with exceptional heart care, we have created designated Provider Care Teams.  These Care Teams include your primary Cardiologist (physician) and Advanced Practice Providers (APPs -  Physician Assistants and Nurse Practitioners) who all work together to provide you with the care you need, when you need it.  We recommend signing up for the patient portal called "MyChart".  Sign up information is provided on this After Visit Summary.  MyChart is used to connect with patients for Virtual Visits (Telemedicine).  Patients are able to view lab/test results, encounter notes, upcoming appointments, etc.  Non-urgent messages can be sent to your provider as well.   To learn more about what you can do with MyChart, go to [email or web address].    Your next appointment:   F/u as needed  Provider:   Maisie Fus, MD

## 2022-11-19 NOTE — Progress Notes (Signed)
Cardiology Office Note:    Date:  11/19/2022   ID:  Desiree Parker, DOB October 01, 1945, MRN [id number]  PCP:  Doreene Eland, MD   [facility] Providers Cardiologist:  Maisie Fus, MD     Referring MD: Doreene Eland, MD   No chief complaint on file. SOB  History of Present Illness:    Desiree Parker is a 77 y.o. female with a hx of HTN, HLD, T2DM, COPD, referral 2/2 high blood pressure. She was at a health equity event and her BP was 144/78 mmHg. BP better controlled with her PCP. She was referred here. Her echo from March of last year showed EF is normal, nl RV, no valve dx. She had a lexiscan SPECT in 2021 that was normal. Echo at that time 04/2019 was normal. Echo in 2017 was also unremarkable.  Today, she's here with her son who helps take care of her. She notes SOB with limited walking, for a few years. She notes it is now worse. Notes SOB with 50 feet or so.  No chest pressure. Her son notes wheezing. States started using albuterol. Helps to some degree. Notes every once in a while she gets leg swelling. No orthopnea/PND. Has not tried a diuretic.  Smoked 16 to 50s She checks her blood pressures sometimes.  Interim 11/19/2022 Desiree Parker notes SOB continues with hills. Otherwise, she denies angina, dyspnea on exertion, lower extremity edema, PND or orthopnea.    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  There is infiltrate consistent with pneumonia in portions of the right lower lobe. There is patchy atelectasis in the left lower lobe. There is no appreciable adenopathy. The prominence in the right hilum noted previously is apparently due to vascular prominence as opposed to mass or adenopathy. 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     albuterol (VENTOLIN HFA) 108 (90 Base) MCG/ACT inhaler INHALE 2 PUFFS INTO THE LUNGS EVERY 6 HOURS AS NEEDED FOR WHEEZING OR SHORTNESS OF BREATH 25.5 g 6   alendronate (FOSAMAX) 70 MG tablet TAKE 1 TABLET BY MOUTH EVERY 7 DAYS WITH A FULL GLASS OF WATER AND ON AN EMPTY STOMACH 12 tablet 2   amLODipine (NORVASC) 10 MG tablet Take 1 tablet (  10 mg total) by mouth daily. 90 tablet 1   aspirin EC 81 MG tablet Take 1 tablet (81 mg total) by mouth daily. Swallow whole. 30 tablet 11   atorvastatin (LIPITOR) 20 MG tablet TAKE 1 TABLET(20 MG) BY MOUTH DAILY AT 6 PM 90 tablet 1   Blood Glucose Monitoring Suppl (ONETOUCH VERIO REFLECT) w/Device KIT 1 Units by Does not apply route 3 (three) times daily. 1 kit 0   Blood Glucose Monitoring Suppl (ONETOUCH VERIO) w/Device KIT To check blood sugar once daily. E11.9 1 kit 0   BREO ELLIPTA 100-25 MCG/ACT AEPB INHALE 1 PUFF INTO THE LUNGS DAILY 60 each 1   calcium-vitamin D (OSCAL WITH D) 500-200 MG-UNIT tablet Take 1 tablet by mouth 2 (two)  times daily. 60 tablet 4   glucose blood (ONETOUCH VERIO) test strip TO CHECK BLOOD SUGAR ONCE DAILY 100 strip 3   Lancet Devices (ONE TOUCH DELICA LANCING DEV) MISC To check blood sugar once daily. E11.9 1 each 0   Lancets (ONETOUCH DELICA PLUS LANCET33G) MISC TO CHECK BLOOD SUGAR ONCE DAILY 100 each 3   lisinopril (ZESTRIL) 20 MG tablet TAKE 1 TABLET(20 MG) BY MOUTH DAILY 90 tablet 1   metFORMIN (GLUCOPHAGE-XR) 500 MG 24 hr tablet TAKE 1 TABLET(500 MG) BY MOUTH EVERY OTHER DAY 45 tablet 1   omeprazole (PRILOSEC) 20 MG capsule TAKE ONE CAPSULE BY MOUTH EVERY DAY AS NEEDED. PROLONGED USE. MAY CAUSE RENAL IMPAIRMENT 90 capsule 1   tiotropium (SPIRIVA HANDIHALER) 18 MCG inhalation capsule INHALE THE CONTENT OF 1 CAPSULE VIA HANDIHALER BY MOUTH EVERY MORNING 90 capsule 2   furosemide (LASIX) 20 MG tablet Take 1 tablet (20 mg total) by mouth daily. 90 tablet 3   No current facility-administered medications on file prior to visit.     Allergies:   Patient has no known allergies.   Social History   Socioeconomic History   Marital status: Single    Spouse name: Not on file   Number of children: 1   Years of education: 12   Highest education level: Not on file  Occupational History   Not on file  Tobacco Use   Smoking status: Former    Current packs/day: 0.00    Average packs/day: 2.0 packs/day for 27.0 years (54.0 ttl pk-yrs)    Types: Cigarettes    Start date: 09/24/1961    Quit date: 09/24/1988    Years since quitting: 34.1    Passive exposure: Past   Smokeless tobacco: Former    Types: Snuff    Quit date: 07/05/2018  Substance and Sexual Activity   Alcohol use: Yes    Alcohol/week: 2.0 standard drinks of alcohol    Types: 2 Standard drinks or equivalent per week   Drug use: No   Sexual activity: Not Currently    Birth control/protection: Surgical  Other Topics Concern   Not on file  Social History Narrative   Lives alone, retired Hospital doctor   Son, Nahara Laffey in [place]             Social Determinants of Health   Financial Resource Strain: Low Risk  (02/22/2022)   Overall Financial Resource Strain (CARDIA)    Difficulty of Paying Living Expenses: Not very hard  Food Insecurity: No Food Insecurity (07/22/2022)   Hunger Vital Sign    Worried About Running Out of Food in the Last Year: Never true    Ran Out of Food in the Last Year: Never true  Transportation Needs: No  Transportation Needs (07/22/2022)   PRAPARE - Administrator, Civil Service (Medical): No    Lack of Transportation (Non-Medical): No  Physical Activity: Inactive (02/22/2022)   Exercise Vital Sign    Days of Exercise per Week: 0 days    Minutes of Exercise per Session: 0 min  Stress: No Stress Concern Present (02/22/2022)   [employer] of Occupational Health - Occupational Stress Questionnaire    Feeling of Stress : Not at all  Social Connections: Not on file     Family History: The patient's family history includes Asthma in her son; Diabetes in her father and mother; Hypertension in her mother and sister; Lung cancer in her mother. There is no history of Colon polyps, Esophageal cancer, Stomach cancer, or Rectal cancer.  ROS:   Please see the history of present illness.     All other systems reviewed and are negative.  EKGs/Labs/Other Studies Reviewed:    The following studies were reviewed today:   EKG:  EKG is  ordered today.  The ekg ordered today demonstrates   08/19/2022- NSR, LAFB  Recent Labs: No results found for requested labs within last 365 days.  Recent Lipid Panel    Component Value Date/Time   CHOL 126 06/30/2021 1036   TRIG 89 06/30/2021 1036   HDL 65 06/30/2021 1036   CHOLHDL 1.9 06/30/2021 1036   CHOLHDL 2.5 10/27/2015 0437   VLDL 10 10/27/2015 0437   LDLCALC 44 06/30/2021 1036   LDLDIRECT 132 (H) 08/01/2012 1629     Risk Assessment/Calculations:     Physical Exam:    VS:  Vitals:   11/19/22 0804  BP: 126/64  Pulse: 94   SpO2: 97%     Wt Readings from Last 3 Encounters:  11/19/22 202 lb 9.6 oz (91.9 kg)  08/19/22 204 lb 3.2 oz (92.6 kg)  07/27/22 203 lb 4 oz (92.2 kg)     GEN:  Well nourished, well developed in no acute distress HEENT: Normal NECK: No JVD; No carotid bruits CARDIAC: RRR, no murmurs, rubs, gallops RESPIRATORY:  Clear to auscultation without rales, wheezing or rhonchi  ABDOMEN: Soft, non-tender, non-distended MUSCULOSKELETAL:  No edema; No deformity  SKIN: Warm and dry NEUROLOGIC:  Alert and oriented x 3 PSYCHIATRIC:  Normal affect   ASSESSMENT:   Dyspnea/Deconditioning: Cause is multifactorial. We discussed weight is contributing. EF is normal,  no pulmonary HTN, RA pressure 3 mmhg, E/e'10 mild. Discussed increasing her stamina with daily walking. Discussed visiting the [facility] website to review a heart healthy diet. She can continue lasix.  HTN: continue norvasc 10 mg daily. Continue lisinopril 20 mg daily.  HLD/DM2: continue lipitor. Goal A1c < 7. PLAN:    In order of problems listed above:   Follow up PRN      Medication Adjustments/Labs and Tests Ordered: Current medicines are reviewed at length with the patient today.  Concerns regarding medicines are outlined above.  No orders of the defined types were placed in this encounter.  No orders of the defined types were placed in this encounter.   Patient Instructions  Medication Instructions:  Your physician recommends that you continue on your current medications as directed. Please refer to the Current Medication list given to you today.  *If you need a refill on your cardiac medications before your next appointment, please call your pharmacy*   Follow-Up: At [facility], you and your health needs are our priority.  As part of our continuing mission to provide  you with exceptional heart care, we have created designated Provider Care Teams.  These Care Teams include your primary Cardiologist (physician) and  Advanced Practice Providers (APPs -  Physician Assistants and Nurse Practitioners) who all work together to provide you with the care you need, when you need it.  We recommend signing up for the patient portal called "MyChart".  Sign up information is provided on this After Visit Summary.  MyChart is used to connect with patients for Virtual Visits (Telemedicine).  Patients are able to view lab/test results, encounter notes, upcoming appointments, etc.  Non-urgent messages can be sent to your provider as well.   To learn more about what you can do with MyChart, go to [email or web address].    Your next appointment:   F/u as needed  Provider:   Maisie Fus, MD    Signed, Maisie Fus, MD  11/19/2022 9:17 AM    [facility]

## 2022-12-02 ENCOUNTER — Telehealth: Payer: Self-pay | Admitting: *Deleted

## 2022-12-02 NOTE — Progress Notes (Signed)
  Care Coordination Note  12/02/2022 Name: Desiree Parker MRN: [id number] DOB: 12/22/1945  Desiree Parker is a 77 y.o. year old female who is a primary care patient of Doreene Eland, MD and is actively engaged with the care management team. I reached out to [name] by phone today to assist with re-scheduling a follow up visit with the RN Case Manager  Follow up plan: Unsuccessful telephone outreach attempt made. A HIPAA compliant phone message was left for the patient providing contact information and requesting a return call.   [facility]  Care Coordination Care Guide  Direct Dial: [phone]

## 2022-12-03 ENCOUNTER — Ambulatory Visit (INDEPENDENT_AMBULATORY_CARE_PROVIDER_SITE_OTHER): Payer: 59 | Admitting: Podiatry

## 2022-12-03 DIAGNOSIS — M79674 Pain in right toe(s): Secondary | ICD-10-CM | POA: Diagnosis not present

## 2022-12-03 DIAGNOSIS — M79675 Pain in left toe(s): Secondary | ICD-10-CM | POA: Diagnosis not present

## 2022-12-03 DIAGNOSIS — B351 Tinea unguium: Secondary | ICD-10-CM | POA: Diagnosis not present

## 2022-12-03 DIAGNOSIS — E1151 Type 2 diabetes mellitus with diabetic peripheral angiopathy without gangrene: Secondary | ICD-10-CM | POA: Diagnosis not present

## 2022-12-04 NOTE — Progress Notes (Signed)
Subjective: Chief Complaint  Patient presents with   Nail Problem    Diabetic Foot Care-nail trim    77 year old female presents the office today with concerns of thick, elongated nails that she is not able to trim them herself and they are causing pain.  She does not report any fevers or chills or any edema, erythema to the toenail sites.  She has no new concerns today.  Desiree Eland, MD- Last seen 07/27/2022 Last A1c was 6.9 on 06/29/2022   Objective: AAO x3, NAD DP/PT pulses palpable bilaterally, CRT less than 3 seconds Nails are hypertrophic, dystrophic, brittle, discolored, elongated 10. No surrounding redness or drainage. Tenderness nails 1-5 bilaterally. No open lesions or pre-ulcerative lesions are identified today. No pain with calf compression, swelling, warmth, erythema  Assessment: Symptomatic onychomycosis  Plan: -All treatment options discussed with the patient including all alternatives, risks, complications.  -Sharply debrided nails x 10 without any complications or bleeding. -Daily foot inspection/glucose control -Patient encouraged to call the office with any questions, concerns, change in symptoms.   Vivi Barrack DPM

## 2022-12-09 NOTE — Progress Notes (Signed)
  Care Coordination Note  12/09/2022 Name: Desiree Parker MRN: [id number] DOB: 05/09/1945  Desiree Parker is a 77 y.o. year old female who is a primary care patient of Doreene Eland, MD and is actively engaged with the care management team. I reached out to [name] by phone today to assist with re-scheduling a follow up visit with the RN Case Manager  Follow up plan: Unsuccessful telephone outreach attempt made. A HIPAA compliant phone message was left for the patient providing contact information and requesting a return call.   [facility]  Care Coordination Care Guide  Direct Dial: [phone]

## 2022-12-14 ENCOUNTER — Other Ambulatory Visit: Payer: Self-pay | Admitting: Family Medicine

## 2022-12-14 DIAGNOSIS — M810 Age-related osteoporosis without current pathological fracture: Secondary | ICD-10-CM

## 2022-12-14 NOTE — Progress Notes (Signed)
  Care Coordination Note  12/14/2022 Name: Desiree Parker MRN: [id number] DOB: 1945/07/26  Desiree Parker is a 77 y.o. year old female who is a primary care patient of Doreene Eland, MD and is actively engaged with the care management team. I reached out to [name] by phone today to assist with re-scheduling a follow up visit with the RN Case Manager  Follow up plan: Unsuccessful telephone outreach attempt made. A HIPAA compliant phone message was left for the patient providing contact information and requesting a return call.  We have been unable to make contact with the patient for follow up. The care management team is available to follow up with the patient after provider conversation with the patient regarding recommendation for care management engagement and subsequent re-referral to the care management team.   [facility] Coordination Care Guide  Direct Dial: [phone]

## 2022-12-14 NOTE — Progress Notes (Signed)
  Care Coordination Note  12/14/2022 Name: GIANINA GIESEL MRN: [id number] DOB: 10-24-1945  DERNA DITTMER is a 77 y.o. year old female who is a primary care patient of Doreene Eland, MD and is actively engaged with the care management team. I reached out to [name] by phone today to assist with re-scheduling a follow up visit with the RN Case Manager  Follow up plan: Telephone appointment with care management team member scheduled for:01/12/23  [facility] Coordination Care Guide  Direct Dial: [phone]

## 2022-12-16 ENCOUNTER — Telehealth: Payer: Self-pay | Admitting: Family Medicine

## 2022-12-16 NOTE — Telephone Encounter (Signed)
HIPAA compliant callback message left.  Please help patient schedule follow up with me soon for her yearly DM Kidney test when she calls.  Dr. Lum Babe

## 2022-12-17 ENCOUNTER — Encounter: Payer: Self-pay | Admitting: Adult Health

## 2022-12-17 ENCOUNTER — Ambulatory Visit (INDEPENDENT_AMBULATORY_CARE_PROVIDER_SITE_OTHER): Payer: 59 | Admitting: Adult Health

## 2022-12-17 VITALS — BP 124/74 | HR 98 | Temp 98.1°F | Ht 59.0 in | Wt 199.4 lb

## 2022-12-17 DIAGNOSIS — G4733 Obstructive sleep apnea (adult) (pediatric): Secondary | ICD-10-CM | POA: Diagnosis not present

## 2022-12-17 DIAGNOSIS — R5381 Other malaise: Secondary | ICD-10-CM | POA: Diagnosis not present

## 2022-12-17 DIAGNOSIS — J45909 Unspecified asthma, uncomplicated: Secondary | ICD-10-CM | POA: Insufficient documentation

## 2022-12-17 DIAGNOSIS — R0609 Other forms of dyspnea: Secondary | ICD-10-CM

## 2022-12-17 DIAGNOSIS — J4530 Mild persistent asthma, uncomplicated: Secondary | ICD-10-CM | POA: Diagnosis not present

## 2022-12-17 DIAGNOSIS — Z23 Encounter for immunization: Secondary | ICD-10-CM | POA: Diagnosis not present

## 2022-12-17 NOTE — Patient Instructions (Addendum)
Stop Spiriva.  Take BREO 1 puff daily , rinse after use.   Keep Head of bed elevated at 30 degrees Avoid sleeping on your back Do not drive if sleepy  Work on healthy weight  Call back if you change your mind on CPAP   Order for home PT .   Flu shot today   Follow up in 1 year with Dr. Francine Graven or Dantre Yearwood NP and As needed  (30 min slot)

## 2022-12-17 NOTE — Progress Notes (Signed)
@Patient  ID: Desiree Parker, female    DOB: [birth year], 77 y.o.   MRN: [id number]  Chief Complaint  Patient presents with   Follow-up    Referring provider: Doreene Eland, MD  HPI: 77 year old female former smoker followed for obstructive sleep apnea and shortness of breath and  asthma Medical history significant for memory impairment   TEST/EVENTS :  PFT 12/23/21  mildly reduced diffusing capacity   PFT 2021 with Mild restriction, low erv, borderline normal DLCO   PSG 02/04/22: - Moderate obstructive sleep apnea occurred during this study (AHI = 16.6/h). Events were predominantly noted during REM. Supine sleep was minimal. - No significant central sleep apnea occurred during this study (CAI = 0.2/h). - Mild oxygen desaturation was noted during this study (Min O2 = 83.0%). - The patient snored with moderate snoring volume. - No cardiac abnormalities were noted during this study. - Clinically significant periodic limb movements did not occur during sleep. No significant associated arousals.   PSG 08/2019: IMPRESSIONS - Mild obstructive sleep apnea occurred during this study (AHI = 8.8/h). - No significant central sleep apnea occurred during this study (CAI = 0.0/h). - Oxygen desaturation was noted during this study (Min O2 = 86.00%). Mean sat 94.89%. - Time with O2 saturation 88% or less was 0.9 minutes. - The patient snored with soft snoring volume. - EKG findings include PVCs. - Clinically significant periodic limb movements did not occur during sleep. No significant associated arousals.   Echocardiogram:    TTE 07/02/21:  1. Left ventricular ejection fraction, by estimation, is 55 to 60%. The  left ventricle has normal function. The left ventricle has no regional  wall motion abnormalities. Left ventricular diastolic parameters were  normal.   2. Right ventricular systolic function is normal. The right ventricular  size is normal. Tricuspid regurgitation signal is  inadequate for assessing  PA pressure.   3. The mitral valve is grossly normal. Trivial mitral valve  regurgitation. No evidence of mitral stenosis.   4. The aortic valve is grossly normal. Aortic valve regurgitation is not  visualized. No aortic stenosis is present.    Nuke stress 07/2019:  The left ventricular ejection fraction is hyperdynamic (>65%). Nuclear stress EF: 75%. There was no ST segment deviation noted during stress. The study is normal. This is a low risk study.   12/17/2022 Follow up : Asthma and OSA Patient returns for a 15-month follow-up.  Patient has had multifactorial dyspnea felt to be component of deconditioning, diastolic dysfunction, obesity and possibly asthma.  She was recommended to continue on Spiriva and Breo.  Patient admits that she takes these on occasion sometimes maybe once a week.  Says that sometimes she forgets to take them.  She denies any increased cough or wheezing.  Patient says she is not very short of breath because she really does not do a whole lot.  She is accompanied by her son says that over the last few years her activity tolerance has decreased and if she has to walk any amount of distance she does get short of breath.  Previous PFT showed no significant airflow obstruction.  There was a decreased diffusing capacity. Patient also has underlying sleep apnea has been recommended to begin CPAP therapy.  However patient has been resistant to start on CPAP.  She says she just does not think she would wear it.  We discussed helpful hints such as increasing her head of her bed to 30 degrees and avoiding sleeping on  her back.  She is not a candidate for oral appliance. Patient does live in independent living.  Her son does try to help her some but she is very inactive and weak.  Has some balance issues.  No Known Allergies  Immunization History  Administered Date(s) Administered   Fluad Quad(high Dose 65+) 02/02/2022   Fluad Trivalent(High Dose 65+)  12/17/2022   Influenza Whole 02/21/2007, 02/13/2008, 01/28/2009, 01/13/2010   Influenza, High Dose Seasonal PF 12/27/2016, 01/23/2018   Influenza,inj,Quad PF,6+ Mos 01/30/2016   Influenza-Unspecified 01/10/2017, 01/18/2018, 02/14/2019, 01/24/2020   Moderna SARS-COV2 Booster Vaccination 05/02/2021   PFIZER Comirnaty(Gray Top)Covid-19 Tri-Sucrose Vaccine 10/07/2020, 01/28/2022   PFIZER(Purple Top)SARS-COV-2 Vaccination 05/05/2019, 05/26/2019, 02/26/2020   Pneumococcal Conjugate-13 10/03/2015   Pneumococcal Polysaccharide-23 07/18/2007, 08/01/2012   Td 04/12/2005   Tdap 01/30/2016   Zoster Recombinant(Shingrix) 05/05/2013, 03/07/2019    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HCC) 02/04/2004   ABI 0.84   CAP (community acquired pneumonia) 10/2015   Cataract    Chest pain 09/11/2015   COPD (chronic obstructive pulmonary disease) (HCC)    Degenerative disk disease 10/2005   Lumbar, anterolithesis, L4-5,5-S1   DEVELOPMENTAL READING DISORDER UNSPECIFIED 07/18/2007   Qualifier: Diagnosis of  By: Sheffield Slider MD, Wayne     Diabetes mellitus without complication (HCC)    Estrogen deficiency 09/22/2015   GERD (gastroesophageal reflux disease)    Headache 03/11/2016   Hilar mass    CT Chest 10/2015 IMPRESSION: No demonstrable pulmonary embolus.  There is infiltrate consistent with pneumonia in portions of the right lower lobe. There is patchy atelectasis in the left lower lobe. There is no appreciable adenopathy. The prominence in the right hilum noted previously is apparently due to vascular prominence as opposed to mass or adenopathy. 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   Noncompliance with medications 09/11/2015   Osteoporosis    TOBACCO USER 03/04/2009   Snuff dipper      Tobacco History: Social History   Tobacco Use  Smoking Status Former   Current packs/day: 0.00    Average packs/day: 2.0 packs/day for 27.0 years (54.0 ttl pk-yrs)   Types: Cigarettes   Start date: 09/24/1961   Quit date: 09/24/1988   Years since quitting: 34.2   Passive exposure: Past  Smokeless Tobacco Former   Types: Snuff   Quit date: 07/05/2018   Counseling given: Not Answered   Outpatient Medications Prior to Visit  Medication Sig Dispense Refill   acetaminophen (TYLENOL) 325 MG tablet Take 650 mg by mouth every 4 (four) hours as needed for mild pain.     albuterol (VENTOLIN HFA) 108 (90 Base) MCG/ACT inhaler INHALE 2 PUFFS INTO THE LUNGS EVERY 6 HOURS AS NEEDED FOR WHEEZING OR SHORTNESS OF BREATH 25.5 g 6   alendronate (FOSAMAX) 70 MG tablet TAKE 1 TABLET BY MOUTH EVERY 7 DAYS WITH A FULL GLASS OF WATER AND ON AN EMPTY STOMACH 12 tablet 2   amLODipine (NORVASC) 10 MG tablet Take 1 tablet (10 mg total) by mouth daily. 90 tablet 1   aspirin EC 81 MG tablet Take 1 tablet (81 mg total) by mouth daily. Swallow whole. 30 tablet 11   atorvastatin (LIPITOR) 20 MG tablet TAKE 1 TABLET(20 MG) BY MOUTH DAILY AT 6 PM 90 tablet 1   Blood Glucose Monitoring Suppl (ONETOUCH VERIO REFLECT) w/Device KIT 1 Units by Does not apply route 3 (three)  times daily. 1 kit 0   Blood Glucose Monitoring Suppl (ONETOUCH VERIO) w/Device KIT To check blood sugar once daily. E11.9 1 kit 0   BREO ELLIPTA 100-25 MCG/ACT AEPB INHALE 1 PUFF INTO THE LUNGS DAILY 60 each 1   calcium-vitamin D (OSCAL WITH D) 500-200 MG-UNIT tablet Take 1 tablet by mouth 2 (two) times daily. 60 tablet 4   glucose blood (ONETOUCH VERIO) test strip TO CHECK BLOOD SUGAR ONCE DAILY 100 strip 3   Lancet Devices (ONE TOUCH DELICA LANCING DEV) MISC To check blood sugar once daily. E11.9 1 each 0   Lancets (ONETOUCH DELICA PLUS LANCET33G) MISC TO CHECK BLOOD SUGAR ONCE DAILY 100 each 3   lisinopril (ZESTRIL) 20 MG tablet TAKE 1 TABLET(20 MG) BY MOUTH DAILY 90 tablet 1   metFORMIN (GLUCOPHAGE-XR) 500 MG 24 hr tablet TAKE 1 TABLET(500 MG) BY  MOUTH EVERY OTHER DAY 45 tablet 1   omeprazole (PRILOSEC) 20 MG capsule TAKE ONE CAPSULE BY MOUTH EVERY DAY AS NEEDED. PROLONGED USE. MAY CAUSE RENAL IMPAIRMENT 90 capsule 1   furosemide (LASIX) 20 MG tablet Take 1 tablet (20 mg total) by mouth daily. 90 tablet 3   tiotropium (SPIRIVA HANDIHALER) 18 MCG inhalation capsule INHALE THE CONTENT OF 1 CAPSULE VIA HANDIHALER BY MOUTH EVERY MORNING (Patient not taking: Reported on 12/17/2022) 90 capsule 2   No facility-administered medications prior to visit.     Review of Systems:   Constitutional:   No  weight loss, night sweats,  Fevers, chills,  +fatigue, or  lassitude.  HEENT:   No headaches,  Difficulty swallowing,  Tooth/dental problems, or  Sore throat,                No sneezing, itching, ear ache, nasal congestion, post nasal drip,   CV:  No chest pain,  Orthopnea, PND, swelling in lower extremities, anasarca, dizziness, palpitations, syncope.   GI  No heartburn, indigestion, abdominal pain, nausea, vomiting, diarrhea, change in bowel habits, loss of appetite, bloody stools.   Resp:.  No chest wall deformity  Skin: no rash or lesions.  GU: no dysuria, change in color of urine, no urgency or frequency.  No flank pain, no hematuria   MS:  No joint pain or swelling.  No decreased range of motion.  No back pain.    Physical Exam  BP 124/74 (BP Location: Left Arm, Patient Position: Sitting, Cuff Size: Large)   Pulse 98   Temp 98.1 F (36.7 C) (Oral)   Ht 4\' 11"  (1.499 m)   Wt 199 lb 6.4 oz (90.4 kg)   SpO2 94%   BMI 40.27 kg/m   GEN: A/Ox3; pleasant , NAD, well nourished    HEENT:  /AT,  NOSE-clear, THROAT-clear, no lesions, no postnasal drip or exudate noted.   NECK:  Supple w/ fair ROM; no JVD; normal carotid impulses w/o bruits; no thyromegaly or nodules palpated; no lymphadenopathy.    RESP  Clear  P & A; w/o, wheezes/ rales/ or rhonchi. no accessory muscle use, no dullness to percussion  CARD:  RRR, no m/r/g, tr   peripheral edema, pulses intact, no cyanosis or clubbing.  GI:   Soft & nt; nml bowel sounds; no organomegaly or masses detected.   Musco: Warm bil, no deformities or joint swelling noted.   Neuro: alert, no focal deficits noted.    Skin: Warm, no lesions or rashes    Lab Results:   BNP   Imaging: No results found.  Administration History  None          Latest Ref Rng & Units 05/28/2022    1:51 PM 12/23/2021   12:58 PM 09/04/2019   10:46 AM  PFT Results  FVC-Pre L 1.60  1.74  1.97  P  FVC-Predicted Pre % 71  78  111  P  FVC-Post L 1.64  1.79  1.99  P  FVC-Predicted Post % 73  80  112  P  Pre FEV1/FVC % % 84  85  85  P  Post FEV1/FCV % % 87  87  88  P  FEV1-Pre L 1.35  1.49  1.67  P  FEV1-Predicted Pre % 81  90  123  P  FEV1-Post L 1.42  1.55  1.75  P  DLCO uncorrected ml/min/mmHg 10.53  10.61  12.56  P  DLCO UNC% % 65  66  77  P  DLCO corrected ml/min/mmHg 10.53  10.89  12.56  P  DLCO COR %Predicted % 65  67  77  P  DLVA Predicted % 80  82  94  P  TLC L 3.61  3.94    TLC % Predicted % 83  91    RV % Predicted % 90  106      P Preliminary result    No results found for: "NITRICOXIDE"      Assessment & Plan:   Asthma Possible mild persistent asthma.  Patient has minimum symptoms.  For now we will stop Spiriva.  And continue on Breo.  Encouraged her to use on a daily basis.  If no perceived benefit on return.  Could stop this   Plan  Patient Instructions  Stop Spiriva.  Take BREO 1 puff daily , rinse after use.   Keep Head of bed elevated at 30 degrees Avoid sleeping on your back Do not drive if sleepy  Work on healthy weight  Call back if you change your mind on CPAP   Order for home PT .   Flu shot today   Follow up in 1 year with Dr. Francine Graven or Darol Cush NP and As needed       Physical deconditioning Physical deconditioning, balance issues makes it differential goal for patient to walk any amount of distance.  Will set up for home  evaluation through physical therapy for evaluation and treatment  OSA (obstructive sleep apnea) Sleep apnea we discussed treatment options including CPAP therapy.  Patient does not want to start CPAP at this time.  We discussed avoiding sleeping on her back.  And keeping the head of her bed at a 30 degree angle.  She is not a candidate for oral appliance.  At this time do not think that she would be a candidate for inspire device BMI is at 40     Rubye Oaks, NP 12/17/2022

## 2022-12-17 NOTE — Assessment & Plan Note (Addendum)
Sleep apnea we discussed treatment options including CPAP therapy.  Patient does not want to start CPAP at this time.  We discussed avoiding sleeping on her back.  And keeping the head of her bed at a 30 degree angle.  She is not a candidate for oral appliance.  At this time do not think that she would be a candidate for inspire device BMI is at 40

## 2022-12-17 NOTE — Assessment & Plan Note (Signed)
Possible mild persistent asthma.  Patient has minimum symptoms.  For now we will stop Spiriva.  And continue on Breo.  Encouraged her to use on a daily basis.  If no perceived benefit on return.  Could stop this   Plan  Patient Instructions  Stop Spiriva.  Take BREO 1 puff daily , rinse after use.   Keep Head of bed elevated at 30 degrees Avoid sleeping on your back Do not drive if sleepy  Work on healthy weight  Call back if you change your mind on CPAP   Order for home PT .   Flu shot today   Follow up in 1 year with Dr. Francine Graven or Niall Illes NP and As needed

## 2022-12-17 NOTE — Assessment & Plan Note (Signed)
Physical deconditioning, balance issues makes it differential goal for patient to walk any amount of distance.  Will set up for home evaluation through physical therapy for evaluation and treatment

## 2022-12-21 ENCOUNTER — Other Ambulatory Visit: Payer: Self-pay | Admitting: Family Medicine

## 2022-12-21 DIAGNOSIS — K219 Gastro-esophageal reflux disease without esophagitis: Secondary | ICD-10-CM

## 2023-01-03 ENCOUNTER — Other Ambulatory Visit: Payer: Self-pay | Admitting: Family Medicine

## 2023-01-12 ENCOUNTER — Ambulatory Visit: Payer: Self-pay

## 2023-01-12 NOTE — Patient Outreach (Signed)
  Care Coordination   Follow Up Visit Note   01/12/2023 Name: Desiree Parker MRN: [id number] DOB: Jul 06, 1945  Desiree Parker is a 77 y.o. year old female who sees Desiree Eland, MD for primary care. I spoke with  Desiree Parker by phone today.  What matters to the patients health and wellness today?  Desiree Parker mentioned that she has moved into her new apartment and has been experiencing some shortness of breath. She visited her healthcare provider, and they advised her to stop taking Spiriva. Now, she is taking Breo. We discussed the importance of rinsing her mouth after using her inhaler. Her blood pressure has been stable, and she is consistently taking her medication as prescribed. Additionally, she is walking around her building to get exercise.     Goals Addressed             This Visit's Progress    I want to manage and maintain my blood pressure.       Patient Goals/Self Care Activities: -Patient/Caregiver will self-administer medications as prescribed as evidenced by self-report/primary caregiver report  -Patient/Caregiver will attend all scheduled provider appointments as evidenced by clinician review of documented attendance to scheduled appointments and patient/caregiver report -Patient/Caregiver will call pharmacy for medication refills as evidenced by patient report and review of pharmacy fill history as appropriate -Patient/Caregiver will call provider office for new concerns or questions as evidenced by review of documented incoming telephone call notes and patient report -Patient/Caregiver verbalizes understanding of plan  -Please remember to Checks BP and record as discussed -Follows a low sodium diet/DASH diet  -Continue her exercise/ walking BP Readings from Last 3 Encounters:  12/17/22 124/74  11/19/22 126/64  08/19/22 (!) 142/64           SDOH assessments and interventions completed:  No     Care Coordination Interventions:  Yes, provided    Interventions Today    Flowsheet Row Most Recent Value  Chronic Disease   Chronic disease during today's visit Chronic Obstructive Pulmonary Disease (COPD), Hypertension (HTN)  General Interventions   General Interventions Discussed/Reviewed General Interventions Discussed  [Discussed inhaler and rincing her mouth after use]  Exercise Interventions   Exercise Discussed/Reviewed Exercise Discussed  Pharmacy Interventions   Pharmacy Dicussed/Reviewed Pharmacy Topics Discussed  Safety Interventions   Safety Discussed/Reviewed Safety Reviewed        Follow up plan: Follow up call scheduled for 02/15/23  1030 am    Encounter Outcome:  Patient Visit Completed   Juanell Fairly RN, BSN, [facility]   Care Coordinator Phone: [phone]

## 2023-01-12 NOTE — Patient Instructions (Signed)
Visit Information  Thank you for taking time to visit with me today. Please don't hesitate to contact me if I can be of assistance to you.   Following are the goals we discussed today:   Goals Addressed             This Visit's Progress    I want to manage and maintain my blood pressure.       Patient Goals/Self Care Activities: -Patient/Caregiver will self-administer medications as prescribed as evidenced by self-report/primary caregiver report  -Patient/Caregiver will attend all scheduled provider appointments as evidenced by clinician review of documented attendance to scheduled appointments and patient/caregiver report -Patient/Caregiver will call pharmacy for medication refills as evidenced by patient report and review of pharmacy fill history as appropriate -Patient/Caregiver will call provider office for new concerns or questions as evidenced by review of documented incoming telephone call notes and patient report -Patient/Caregiver verbalizes understanding of plan  -Please remember to Checks BP and record as discussed -Follows a low sodium diet/DASH diet  -Continue her exercise/ walking BP Readings from Last 3 Encounters:  12/17/22 124/74  11/19/22 126/64  08/19/22 (!) 142/64           Our next appointment is by telephone on 02/15/23 at 1030 am  Please call the care guide team at [phone] if you need to cancel or reschedule your appointment.   If you are experiencing a Mental Health or Behavioral Health Crisis or need someone to talk to, please call 1-800-273-TALK (toll free, 24 hour hotline)  The patient verbalized understanding of instructions, educational materials, and care plan provided today.    Juanell Fairly RN, BSN, [facility] [employer] [employer] Phone: [phone]

## 2023-02-02 ENCOUNTER — Ambulatory Visit (INDEPENDENT_AMBULATORY_CARE_PROVIDER_SITE_OTHER): Payer: 59 | Admitting: Podiatry

## 2023-02-02 ENCOUNTER — Encounter: Payer: Self-pay | Admitting: Podiatry

## 2023-02-02 DIAGNOSIS — M79674 Pain in right toe(s): Secondary | ICD-10-CM

## 2023-02-02 DIAGNOSIS — E1151 Type 2 diabetes mellitus with diabetic peripheral angiopathy without gangrene: Secondary | ICD-10-CM

## 2023-02-02 DIAGNOSIS — B351 Tinea unguium: Secondary | ICD-10-CM

## 2023-02-02 DIAGNOSIS — M79675 Pain in left toe(s): Secondary | ICD-10-CM

## 2023-02-04 ENCOUNTER — Other Ambulatory Visit: Payer: Self-pay | Admitting: Family Medicine

## 2023-02-04 DIAGNOSIS — Z91148 Patient's other noncompliance with medication regimen for other reason: Secondary | ICD-10-CM

## 2023-02-09 NOTE — Progress Notes (Signed)
  Subjective:  Patient ID: Desiree Parker, female    DOB: [birth year],  MRN: [id number]  77 y.o. female presents with at risk foot care. Pt has h/o NIDDM with PAD and painful thick toenails that are difficult to trim. Pain interferes with ambulation. Aggravating factors include wearing enclosed shoe gear. Pain is relieved with periodic professional debridement. Chief Complaint  Patient presents with   [facility]    DFC A1C 6.9 06/29/2022 BS-Wasn't taken PCP APPT- 04/2023    PCP: Doreene Eland, MD.  New problem(s): None.   Review of Systems: Negative except as noted in the HPI.   No Known Allergies  Objective:  There were no vitals filed for this visit. Constitutional Patient is a pleasant 77 y.o. African American female in NAD. AAO x 3.  Vascular Capillary fill time to digits <3 seconds.  DP pulse(s) are faintly palpable b/l lower extremities. PT pulses nonpalpable b/l. Pedal hair absent b/l. Lower extremity skin temperature gradient warm to cool b/l. No pain with calf compression b/l. No cyanosis or clubbing noted. No ischemia nor gangrene noted b/l.   Neurologic Protective sensation intact 5/5 intact bilaterally with 10g monofilament b/l. Vibratory sensation intact b/l. No clonus b/l.   Dermatologic Pedal skin is thin, shiny and atrophic b/l.  No open wounds b/l lower extremities. No interdigital macerations b/l lower extremities. Toenails 1-5 b/l elongated, discolored, dystrophic, thickened, crumbly with subungual debris and tenderness to dorsal palpation.   Orthopedic: Normal muscle strength 5/5 to all lower extremity muscle groups bilaterally. Utilizes cane for ambulation assistance.   Last HgA1c:     Latest Ref Rng & Units 06/29/2022    8:37 AM  Hemoglobin A1C  Hemoglobin-A1c 0.0 - 7.0 % 6.9      Assessment:   1. Pain due to onychomycosis of toenails of both feet   2. Type II diabetes mellitus with peripheral circulatory disorder [facility])    Plan:  Patient was evaluated and  treated and all questions answered. Consent given for treatment as described below: -Continue foot and shoe inspections daily. Monitor blood glucose per PCP/Endocrinologist's recommendations. -Continue supportive shoe gear daily. -Mycotic toenails 1-5 bilaterally were debrided in length and girth with sterile nail nippers and dremel without incident. -Patient/POA to call should there be question/concern in the interim.  Return in about 3 months (around 05/05/2023).  Freddie Breech, DPM

## 2023-02-15 ENCOUNTER — Ambulatory Visit: Payer: Self-pay

## 2023-02-15 NOTE — Patient Outreach (Signed)
  Care Coordination   02/15/2023 Name: Desiree Parker MRN: [id number] DOB: Jul 29, 1945   Care Coordination Outreach Attempts:  An unsuccessful telephone outreach was attempted today to offer the patient information about available care coordination services.  Follow Up Plan:  Additional outreach attempts will be made to offer the patient care coordination information and services.   Encounter Outcome:  No Answer   Care Coordination Interventions:  No, not indicated    Lonzo Candy, BSN, [facility]  [facility], [facility]  Care Coordinator Phone: [phone]

## 2023-02-22 ENCOUNTER — Telehealth: Payer: Self-pay | Admitting: *Deleted

## 2023-02-22 NOTE — Progress Notes (Signed)
  Care Coordination Note  02/22/2023 Name: LATHA CRAVENER MRN: [id number] DOB: 08-31-1945  SHAELEY YAKLIN is a 77 y.o. year old female who is a primary care patient of Doreene Eland, MD and is actively engaged with the care management team. I reached out to [name] by phone today to assist with re-scheduling a follow up visit with the RN Case Manager  Follow up plan: Unsuccessful telephone outreach attempt made. A HIPAA compliant phone message was left for the patient providing contact information and requesting a return call.   [facility]  Care Coordination Care Guide  Direct Dial: [phone]

## 2023-02-28 NOTE — Progress Notes (Unsigned)
  Care Coordination Note  02/28/2023 Name: Desiree Parker MRN: [id number] DOB: 04-02-1946  [name] is a 77 y.o. year old female who is a primary care patient of Doreene Eland, MD and is actively engaged with the care management team. I reached out to [name] by phone today to assist with re-scheduling a follow up visit with the RN Case Manager  Follow up plan: Unsuccessful telephone outreach attempt made. A HIPAA compliant phone message was left for the patient providing contact information and requesting a return call.  [facility]  Care Coordination Care Guide  Direct Dial: [phone]

## 2023-03-03 NOTE — Progress Notes (Signed)
  Care Coordination Note  03/03/2023 Name: Desiree Parker MRN: [id number] DOB: 28-May-1945  Desiree Parker is a 77 y.o. year old female who is a primary care patient of Doreene Eland, MD and is actively engaged with the care management team. I reached out to [name] by phone today to assist with re-scheduling a follow up visit with the RN Case Manager  Follow up plan: Unsuccessful telephone outreach attempt made. A HIPAA compliant phone message was left for the patient providing contact information and requesting a return call.  We have been unable to make contact with the patient for follow up. The care management team is available to follow up with the patient after provider conversation with the patient regarding recommendation for care management engagement and subsequent re-referral to the care management team.   [facility] Coordination Care Guide  Direct Dial: [phone]

## 2023-03-30 ENCOUNTER — Other Ambulatory Visit: Payer: Self-pay | Admitting: Family Medicine

## 2023-03-30 NOTE — Telephone Encounter (Signed)
Appointment needed. I spoke with her son and scheduled AWV/follow up appointment in Jan. Med refilled.

## 2023-05-04 ENCOUNTER — Ambulatory Visit (INDEPENDENT_AMBULATORY_CARE_PROVIDER_SITE_OTHER): Payer: 59 | Admitting: Podiatry

## 2023-05-04 ENCOUNTER — Encounter: Payer: Self-pay | Admitting: Podiatry

## 2023-05-04 DIAGNOSIS — M2011 Hallux valgus (acquired), right foot: Secondary | ICD-10-CM | POA: Diagnosis not present

## 2023-05-04 DIAGNOSIS — M79674 Pain in right toe(s): Secondary | ICD-10-CM | POA: Diagnosis not present

## 2023-05-04 DIAGNOSIS — M79675 Pain in left toe(s): Secondary | ICD-10-CM

## 2023-05-04 DIAGNOSIS — E1151 Type 2 diabetes mellitus with diabetic peripheral angiopathy without gangrene: Secondary | ICD-10-CM

## 2023-05-04 DIAGNOSIS — B351 Tinea unguium: Secondary | ICD-10-CM

## 2023-05-04 DIAGNOSIS — M2012 Hallux valgus (acquired), left foot: Secondary | ICD-10-CM

## 2023-05-10 NOTE — Progress Notes (Signed)
  Subjective:  Patient ID: Desiree Parker, female    DOB: [birth year],  MRN: [id number]  78 y.o. female presents at risk foot care. Pt has h/o NIDDM with PAD and painful mycotic toenails x 10 which interfere with daily activities. Pain is relieved with periodic professional debridement.  Chief Complaint  Patient presents with   Diabetes    DFC BS - DK A1C - DK LVPCP - 01/2023    New problem(s): None   PCP is Janit Pagan T, MD.  No Known Allergies  Review of Systems: Negative except as noted in the HPI.   Objective:  Desiree Parker is a pleasant 78 y.o. female in NAD. AAO x 3.  Vascular Examination: CFT <3 seconds b/l. DP pulses faintly palpable b/l. PT pulses nonpalpable b/l. Digital hair absent. Skin temperature gradient warm to warm b/l. No pain with calf compression. No ischemia or gangrene. No cyanosis or clubbing noted b/l.    Neurological Examination: Sensation grossly intact b/l with 10 gram monofilament. Vibratory sensation intact b/l.   Dermatological Examination: Pedal skin warm and supple b/l. No open wounds b/l. No interdigital macerations. Toenails 1-5 b/l thick, discolored, elongated with subungual debris and pain on dorsal palpation.  No corns, calluses nor porokeratotic lesions noted.  Musculoskeletal Examination: Muscle strength 5/5 to all lower extremity muscle groups bilaterally. HAV with bunion deformity noted b/l LE. Utilizes cane for ambulation assistance.  Radiographs: None  Last HgA1c:      Latest Ref Rng & Units 06/29/2022    8:37 AM  Hemoglobin A1C  Hemoglobin-A1c 0.0 - 7.0 % 6.9     Radiographs: None Last A1c:      Latest Ref Rng & Units 06/29/2022    8:37 AM  Hemoglobin A1C  Hemoglobin-A1c 0.0 - 7.0 % 6.9      Assessment:   1. Pain due to onychomycosis of toenails of both feet   2. Hallux valgus, acquired, bilateral   3. Type II diabetes mellitus with peripheral circulatory disorder (HCC)    Plan:   Orders Placed This  Encounter  Procedures   For Home Use Only DME Diabetic Shoe    Dispense one pair extra depth shoes and 3 pair total contact insoles.    Patient was evaluated and treated. All patient's and/or POA's questions/concerns addressed on today's visit. Toenails 1-5 debrided in length and girth without incident. Continue soft, supportive shoe gear daily. Report any pedal injuries to medical professional. Call office if there are any questions/concerns. -Continue foot and shoe inspections daily. Monitor blood glucose per PCP/Endocrinologist's recommendations. -Discussed diabetic shoe benefit available based on patient's diagnoses. Patient/POA would like to proceed. Order entered for one pair extra depth shoes and 3 pair total contact insoles. Patient qualifies based on diagnoses. -Patient/POA to call should there be question/concern in the interim.  Return in about 3 months (around 08/02/2023).  Freddie Breech, DPM      [place] LOCATION: [address] [place] [address]                   Office [phone]   [facility] LOCATION: [address] [place], [place] [address] Office [phone]

## 2023-05-13 ENCOUNTER — Encounter: Payer: Self-pay | Admitting: Family Medicine

## 2023-05-13 ENCOUNTER — Ambulatory Visit (INDEPENDENT_AMBULATORY_CARE_PROVIDER_SITE_OTHER): Payer: 59 | Admitting: Family Medicine

## 2023-05-13 VITALS — BP 164/76 | HR 95 | Ht 59.0 in | Wt 200.6 lb

## 2023-05-13 DIAGNOSIS — Z23 Encounter for immunization: Secondary | ICD-10-CM

## 2023-05-13 DIAGNOSIS — Z0000 Encounter for general adult medical examination without abnormal findings: Secondary | ICD-10-CM | POA: Diagnosis not present

## 2023-05-13 DIAGNOSIS — E1165 Type 2 diabetes mellitus with hyperglycemia: Secondary | ICD-10-CM

## 2023-05-13 DIAGNOSIS — E2839 Other primary ovarian failure: Secondary | ICD-10-CM

## 2023-05-13 DIAGNOSIS — I10 Essential (primary) hypertension: Secondary | ICD-10-CM | POA: Diagnosis not present

## 2023-05-13 DIAGNOSIS — Z122 Encounter for screening for malignant neoplasm of respiratory organs: Secondary | ICD-10-CM | POA: Diagnosis not present

## 2023-05-13 DIAGNOSIS — E119 Type 2 diabetes mellitus without complications: Secondary | ICD-10-CM | POA: Diagnosis not present

## 2023-05-13 LAB — POCT GLYCOSYLATED HEMOGLOBIN (HGB A1C): HbA1c, POC (controlled diabetic range): 7.1 % — AB (ref 0.0–7.0)

## 2023-05-13 NOTE — Assessment & Plan Note (Signed)
Her A1C worsened to 7.1 She is compliant with Metformin 500 mg every other day I counseled her on diet - reduce amount of sugary intake Repeat lab in 3-4 months.

## 2023-05-13 NOTE — Addendum Note (Signed)
Addended by: Janit Pagan T on: 05/13/2023 04:57 PM   Modules accepted: Orders

## 2023-05-13 NOTE — Progress Notes (Signed)
Subjective:   Desiree Parker is a 78 y.o. female who presents for Medicare Annual (Subsequent) preventive examination.  Patient consented to have virtual visit and was identified by name and date of birth. Method of visit:In-person  Encounter participants: Patient: Desiree Parker - located at [facility] office Nurse/Provider: Janit Pagan - located at [facility] office Others (if applicable): Son    Review of Systems:  DM: Been eating 1/2 gallon of ice cream per week per her son. Need to reconnect with an ophthalmologist. Denies any other concerns.        Objective:     Vitals: BP (!) 164/76   Pulse 95   Ht 4\' 11"  (1.499 m)   Wt 200 lb 9.6 oz (91 kg)   SpO2 100%   BMI 40.52 kg/m   Body mass index is 40.52 kg/m.     07/27/2022   10:52 AM 02/23/2022    9:55 AM 01/25/2022    8:39 PM 11/24/2021    8:56 AM 10/23/2021    8:33 AM 07/14/2021    9:44 AM 06/30/2021    9:39 AM  Advanced Directives  Does Patient Have a Medical Advance Directive? No No Yes No No No No  Type of Advance Directive   Healthcare Power of Attorney      Does patient want to make changes to medical advance directive?   Yes (MAU/Ambulatory/Procedural Areas - Information given)      Copy of Healthcare Power of Attorney in Chart?   No - copy requested      Would patient like information on creating a medical advance directive? No - Patient declined No - Patient declined  No - Patient declined No - Patient declined      Tobacco Social History   Tobacco Use  Smoking Status Former   Current packs/day: 0.00   Average packs/day: 2.0 packs/day for 27.0 years (54.0 ttl pk-yrs)   Types: Cigarettes   Start date: 09/24/1961   Quit date: 09/24/1988   Years since quitting: 34.6   Passive exposure: Past  Smokeless Tobacco Former   Types: Snuff   Quit date: 07/05/2018     Counseling given: Not Answered   Clinical Intake:     Pain : No/denies pain Pain Score: 0-No pain     BMI - recorded: 40 Nutritional Status:  BMI > 30  Obese Diabetes: Yes           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HCC) 02/04/2004   ABI 0.84   CAP (community acquired pneumonia) 10/2015   Cataract    Chest pain 09/11/2015   COPD (chronic obstructive pulmonary disease) (HCC)    Degenerative disk disease 10/2005   Lumbar, anterolithesis, L4-5,5-S1   DEVELOPMENTAL READING DISORDER UNSPECIFIED 07/18/2007   Qualifier: Diagnosis of  By: Sheffield Slider MD, Wayne     Diabetes mellitus without complication (HCC)    Estrogen deficiency 09/22/2015   GERD (gastroesophageal reflux disease)    Headache 03/11/2016   Hilar mass    CT Chest 10/2015 IMPRESSION: No demonstrable pulmonary embolus.  There is infiltrate consistent with pneumonia in portions of the right lower lobe. There is patchy atelectasis in the left lower lobe. There is no appreciable adenopathy. The prominence in the right hilum noted previously is apparently due to vascular prominence as opposed to mass or adenopathy. 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  Noncompliance with medications 09/11/2015   Osteoporosis    TOBACCO USER 03/04/2009   Snuff dipper     Past Surgical History:  Procedure Laterality Date   CESAREAN SECTION  1975   CHOLECYSTECTOMY  1975   states gallstones removed   TUBAL LIGATION  1975   Family History  Problem Relation Age of Onset   Diabetes Father    Hypertension Mother    Diabetes Mother    Lung cancer Mother    Hypertension Sister    Asthma Son    Colon polyps Neg Hx    Esophageal cancer Neg Hx    Stomach cancer Neg Hx    Rectal cancer Neg Hx    Social History   Socioeconomic History   Marital status: Single    Spouse name: Not on file   Number of children: 1   Years of education: 12   Highest education level: Not on file  Occupational History   Not on file  Tobacco Use   Smoking status: Former    Current  packs/day: 0.00    Average packs/day: 2.0 packs/day for 27.0 years (54.0 ttl pk-yrs)    Types: Cigarettes    Start date: 09/24/1961    Quit date: 09/24/1988    Years since quitting: 34.6    Passive exposure: Past   Smokeless tobacco: Former    Types: Snuff    Quit date: 07/05/2018  Substance and Sexual Activity   Alcohol use: Yes    Alcohol/week: 2.0 standard drinks of alcohol    Types: 2 Standard drinks or equivalent per week   Drug use: No   Sexual activity: Not Currently    Birth control/protection: Surgical  Other Topics Concern   Not on file  Social History Narrative   Lives alone, retired Hospital doctor   Son, Desiree Parker in [place]            Social Drivers of Health   Financial Resource Strain: Low Risk  (02/22/2022)   Overall Financial Resource Strain (CARDIA)    Difficulty of Paying Living Expenses: Not very hard  Food Insecurity: No Food Insecurity (05/13/2023)   Hunger Vital Sign    Worried About Running Out of Food in the Last Year: Never true    Ran Out of Food in the Last Year: Never true  Transportation Needs: No Transportation Needs (05/13/2023)   PRAPARE - Administrator, Civil Service (Medical): No    Lack of Transportation (Non-Medical): No  Physical Activity: Insufficiently Active (05/13/2023)   Exercise Vital Sign    Days of Exercise per Week: 7 days    Minutes of Exercise per Session: 20 min  Stress: No Stress Concern Present (02/22/2022)   [employer] of Occupational Health - Occupational Stress Questionnaire    Feeling of Stress : Not at all  Social Connections: Not on file    Outpatient Encounter Medications as of 05/13/2023  Medication Sig   alendronate (FOSAMAX) 70 MG tablet TAKE 1 TABLET BY MOUTH EVERY 7 DAYS WITH A FULL GLASS OF WATER AND ON AN EMPTY STOMACH   amLODipine (NORVASC) 10 MG tablet TAKE 1 TABLET(10 MG) BY MOUTH DAILY   aspirin EC 81 MG tablet Take 1 tablet (81 mg total) by mouth daily. Swallow whole.   atorvastatin  (LIPITOR) 20 MG tablet TAKE 1 TABLET(20 MG) BY MOUTH DAILY AT 6 PM   BREO ELLIPTA 100-25 MCG/ACT AEPB INHALE 1 PUFF INTO THE LUNGS DAILY   calcium-vitamin D (OSCAL WITH D) 500-200  MG-UNIT tablet Take 1 tablet by mouth 2 (two) times daily.   furosemide (LASIX) 20 MG tablet Take 1 tablet (20 mg total) by mouth daily.   lisinopril (ZESTRIL) 20 MG tablet TAKE 1 TABLET(20 MG) BY MOUTH DAILY   metFORMIN (GLUCOPHAGE-XR) 500 MG 24 hr tablet TAKE 1 TABLET(500 MG) BY MOUTH EVERY OTHER DAY   omeprazole (PRILOSEC) 20 MG capsule TAKE 1 CAPSULE BY MOUTH EVERY DAY AS NEEDED. PROLONGED USE MAY CAUSE RENAL IMPAIRMENT   acetaminophen (TYLENOL) 325 MG tablet Take 650 mg by mouth every 4 (four) hours as needed for mild pain. (Patient not taking: Reported on 05/13/2023)   albuterol (VENTOLIN HFA) 108 (90 Base) MCG/ACT inhaler INHALE 2 PUFFS INTO THE LUNGS EVERY 6 HOURS AS NEEDED FOR WHEEZING OR SHORTNESS OF BREATH (Patient not taking: Reported on 05/13/2023)   Blood Glucose Monitoring Suppl (ONETOUCH VERIO REFLECT) w/Device KIT 1 Units by Does not apply route 3 (three) times daily. (Patient not taking: Reported on 05/13/2023)   Blood Glucose Monitoring Suppl (ONETOUCH VERIO) w/Device KIT To check blood sugar once daily. E11.9 (Patient not taking: Reported on 05/13/2023)   glucose blood (ONETOUCH VERIO) test strip TO CHECK BLOOD SUGAR ONCE DAILY (Patient not taking: Reported on 05/13/2023)   Lancet Devices (ONE TOUCH DELICA LANCING DEV) MISC To check blood sugar once daily. E11.9 (Patient not taking: Reported on 05/13/2023)   Lancets (ONETOUCH DELICA PLUS LANCET33G) MISC TO CHECK BLOOD SUGAR ONCE DAILY (Patient not taking: Reported on 05/13/2023)   No facility-administered encounter medications on file as of 05/13/2023.    Activities of Daily Living    05/13/2023   11:19 AM  In your present state of health, do you have any difficulty performing the following activities:  Hearing? 0  Vision? 0  Comment She wears eyeglasses   Difficulty concentrating or making decisions? 0  Walking or climbing stairs? 1  Dressing or bathing? 0  Doing errands, shopping? 0    Patient Care Team: Doreene Eland, MD as PCP - General (Family Medicine) Maisie Fus, MD as PCP - Cardiology (Cardiology)    Assessment:   This is a routine wellness examination for Renatha.  Exercise Activities and Dietary recommendations     Goals      Blood Pressure < 150/90     BP goal < 140/90 She is trying to keep it under control     I want to manage and maintain my blood pressure.     Patient Goals/Self Care Activities: -Patient/Caregiver will self-administer medications as prescribed as evidenced by self-report/primary caregiver report  -Patient/Caregiver will attend all scheduled provider appointments as evidenced by clinician review of documented attendance to scheduled appointments and patient/caregiver report -Patient/Caregiver will call pharmacy for medication refills as evidenced by patient report and review of pharmacy fill history as appropriate -Patient/Caregiver will call provider office for new concerns or questions as evidenced by review of documented incoming telephone call notes and patient report -Patient/Caregiver verbalizes understanding of plan  -Please remember to Checks BP and record as discussed -Follows a low sodium diet/DASH diet  -Continue her exercise/ walking BP Readings from Last 3 Encounters:  12/17/22 124/74  11/19/22 126/64  08/19/22 (!) 142/64        Patient Stated                 Fall Risk    05/13/2023   11:18 AM 05/13/2023   11:03 AM 07/27/2022   10:53 AM 06/29/2022    8:40 AM 02/23/2022  9:54 AM  Fall Risk   Falls in the past year? 0 0 1 0 0  Number falls in past yr: 0 0 0 0 0  Injury with Fall? 0 0 1 0 0  Risk for fall due to : No Fall Risks       Is the patient's home free of loose throw rugs in walkways, pet beds, electrical cords, etc?   yes      Grab bars in the  bathroom? yes      Handrails on the stairs?   No stairs      Adequate lighting?   yes  Patient rating of health (0-10) scale: 8   Depression Screen    05/13/2023   11:18 AM 07/27/2022   10:57 AM 06/29/2022    8:45 AM 02/23/2022    9:54 AM  PHQ 2/9 Scores  PHQ - 2 Score 0 0 0 0  PHQ- 9 Score 1 0 0 2     Cognitive Function        Immunization History  Administered Date(s) Administered   Fluad Quad(high Dose 65+) 02/02/2022   Fluad Trivalent(High Dose 65+) 12/17/2022   Influenza Whole 02/21/2007, 02/13/2008, 01/28/2009, 01/13/2010   Influenza, High Dose Seasonal PF 12/27/2016, 01/23/2018   Influenza,inj,Quad PF,6+ Mos 01/30/2016   Influenza-Unspecified 01/10/2017, 01/18/2018, 02/14/2019, 01/24/2020   Moderna SARS-COV2 Booster Vaccination 05/02/2021   PFIZER Comirnaty(Gray Top)Covid-19 Tri-Sucrose Vaccine 10/07/2020, 01/28/2022   PFIZER(Purple Top)SARS-COV-2 Vaccination 05/05/2019, 05/26/2019, 02/26/2020   Pfizer(Comirnaty)Fall Seasonal Vaccine 12 years and older 05/13/2023   Pneumococcal Conjugate-13 10/03/2015   Pneumococcal Polysaccharide-23 07/18/2007, 08/01/2012   Td 04/12/2005   Tdap 01/30/2016   Zoster Recombinant(Shingrix) 05/05/2013, 03/07/2019     Screening Tests Health Maintenance  Topic Date Due   Diabetic kidney evaluation - Urine ACR  10/24/2022   Diabetic kidney evaluation - eGFR measurement  10/29/2022   OPHTHALMOLOGY EXAM  12/25/2022   FOOT EXAM  07/27/2023   HEMOGLOBIN A1C  11/10/2023   Medicare Annual Wellness (AWV)  05/12/2024   DTaP/Tdap/Td (3 - Td or Tdap) 01/29/2026   Pneumonia Vaccine 38+ Years old  Completed   INFLUENZA VACCINE  Completed   DEXA SCAN  Completed   COVID-19 Vaccine  Completed   Hepatitis C Screening  Completed   Zoster Vaccines- Shingrix  Completed   HPV VACCINES  Aged Out   Colonoscopy  Discontinued    Cancer Screenings: Lung: Low Dose CT Chest recommended if Age 21-80 years, 20 pack-year currently smoking OR have quit  w/in 15years. Patient does qualify. Breast:  Up to date on Mammogram? Yes   Up to date of Bone Density/Dexa? Yes. Need repeat Dexa scan on meds. Colorectal: Up to date  Additional Screenings: : Hepatitis C Screening: up to date  Mini-Cog - 05/13/23 1123     Normal clock drawing test? yes    How many words correct? 3                Plan:   Diabetes mellitus, type II (HCC) Her A1C worsened to 7.1 She is compliant with Metformin 500 mg every other day I counseled her on diet - reduce amount of sugary intake Repeat lab in 3-4 months.  HYPERTENSION, BENIGN SYSTEMIC Uncontrolled BP Unclear how she takes her home meds F/U appointment made and I advised her to come in with all her home meds for review She and her son agreed with the plan Monitor BP closely for now     I have personally reviewed and  noted the following in the patient's chart:   Medical and social history Use of alcohol, tobacco or illicit drugs  Current medications and supplements Functional ability and status Nutritional status Physical activity Advanced directives List of other physicians Hospitalizations, surgeries, and ER visits in previous 12 months Vitals Screenings to include cognitive, depression, and falls Referrals and appointments  In addition, I have reviewed and discussed with patient certain preventive protocols, quality metrics, and best practice recommendations. A written personalized care plan for preventive services as well as general preventive health recommendations were provided to patient.    This visit was conducted virtually in the setting of the COVID19 pandemic.    Janit Pagan, MD  05/13/2023

## 2023-05-13 NOTE — Patient Instructions (Signed)

## 2023-05-13 NOTE — Assessment & Plan Note (Signed)
Uncontrolled BP Unclear how she takes her home meds F/U appointment made and I advised her to come in with all her home meds for review She and her son agreed with the plan Monitor BP closely for now

## 2023-05-14 LAB — MICROALBUMIN / CREATININE URINE RATIO
Creatinine, Urine: 53.3 mg/dL
Microalb/Creat Ratio: 217 mg/g{creat} — ABNORMAL HIGH (ref 0–29)
Microalbumin, Urine: 115.9 ug/mL

## 2023-05-14 LAB — LIPID PANEL
Chol/HDL Ratio: 2.1 {ratio} (ref 0.0–4.4)
Cholesterol, Total: 162 mg/dL (ref 100–199)
HDL: 79 mg/dL (ref >39)
LDL Chol Calc (NIH): 65 mg/dL (ref 0–99)
Triglycerides: 102 mg/dL (ref 0–149)
VLDL Cholesterol Cal: 18 mg/dL (ref 5–40)

## 2023-05-14 LAB — BASIC METABOLIC PANEL
BUN/Creatinine Ratio: 7 — ABNORMAL LOW (ref 12–28)
BUN: 6 mg/dL — ABNORMAL LOW (ref 8–27)
CO2: 26 mmol/L (ref 20–29)
Calcium: 10.4 mg/dL — ABNORMAL HIGH (ref 8.7–10.3)
Chloride: 99 mmol/L (ref 96–106)
Creatinine, Ser: 0.87 mg/dL (ref 0.57–1.00)
Glucose: 128 mg/dL — ABNORMAL HIGH (ref 70–99)
Potassium: 4.7 mmol/L (ref 3.5–5.2)
Sodium: 141 mmol/L (ref 134–144)
eGFR: 69 mL/min/{1.73_m2} (ref >59)

## 2023-05-16 ENCOUNTER — Telehealth: Payer: Self-pay | Admitting: Family Medicine

## 2023-05-16 NOTE — Telephone Encounter (Signed)
I called and discussed her test results with her son. Microalbumin is moderately elevated. Likely due to her HTN and DM2. BP and glycemic control were discussed. Continue ACEi. Calcium level is slightly elevated. She is on a Calcium supplement. We will hold the Calcium supplement for now and recheck the lab in 4-8 weeks.  FLP is in the normal range All questions were answered.

## 2023-05-20 ENCOUNTER — Telehealth: Payer: Self-pay

## 2023-05-20 NOTE — Telephone Encounter (Signed)
 Spoke with patients son. Informed him of patients CT scan at [facility]. Feb 12th at 4:00pm. Patients son took down info. Linnie Riches, CMA

## 2023-05-20 NOTE — Telephone Encounter (Signed)
-----   Message from [facility] Genevia Kern S sent at 05/20/2023  9:24 AM EST ----- CT ok to schedule at [facility]. No prior authorization is needed.  Rosanna Comment, CMA

## 2023-05-20 NOTE — Telephone Encounter (Signed)
 Attempted to reach patient. No answer LVM of time and date of CT scan. Wed. Feb 12th at 4:00pm go in at Entrance A. Linnie Riches, CMA

## 2023-05-23 ENCOUNTER — Other Ambulatory Visit: Payer: Self-pay | Admitting: Family Medicine

## 2023-05-23 ENCOUNTER — Telehealth: Payer: Self-pay

## 2023-05-23 MED ORDER — FUROSEMIDE 20 MG PO TABS
20.0000 mg | ORAL_TABLET | Freq: Every day | ORAL | 1 refills | Status: DC
Start: 2023-05-23 — End: 2023-06-20

## 2023-05-23 NOTE — Telephone Encounter (Signed)
 Done

## 2023-05-23 NOTE — Telephone Encounter (Signed)
 Spoke with the patient son he stated that his mother called in the wrong medication and that she needed the Lasix  20 mg instead. He stated that he go over her meds every Saturday but she called in the wrong one.

## 2023-05-24 ENCOUNTER — Telehealth: Payer: Self-pay

## 2023-05-24 NOTE — Telephone Encounter (Signed)
Patient calls nurse line requesting prescription for cough medication with codeine. She reports that she has had a dry cough since last Monday.   Denies fever, chills, body aches, headache, sore throat or congestion.   Advised patient that she would likely need an appointment prior to medication being prescribed.   Patient reports that she was just seen on 05/13/23.  Advised patient that this was for her annual wellness visit and that provider would likely need to perform physical exam. She asked that I send message to Dr. Lum Babe prior to scheduling appointment.   Please advise.   Veronda Prude, RN

## 2023-05-25 ENCOUNTER — Ambulatory Visit (HOSPITAL_COMMUNITY): Payer: 59

## 2023-05-25 NOTE — Telephone Encounter (Signed)
Patient scheduled for virtual apt in [facility] tomorrow afternoon.

## 2023-05-26 ENCOUNTER — Telehealth (INDEPENDENT_AMBULATORY_CARE_PROVIDER_SITE_OTHER): Payer: 59 | Admitting: Student

## 2023-05-26 DIAGNOSIS — R059 Cough, unspecified: Secondary | ICD-10-CM | POA: Insufficient documentation

## 2023-05-26 NOTE — Assessment & Plan Note (Addendum)
Acute cough for 7 days. Multiple possible causes of her dry cough including ACE inhibitor induced, CHF exacerbation, COPD exacerbation although she does not meet cardinal symptoms for this at this time. Did have sick contact at church with cough- could have viral illness (COVID, Flu, RSV) Encouraged to avoid combo cold/flu medicines such as Nyquil. Encouraged honey and warm water/hydration. Discussed return precautions should she develop purulent sputum, fever, chills, worsening of symptoms. Reassuringly, she is scheduled for CT low-dose lung screening on 2/19. In- person office visit for physical exam to further delineate cause of her cough should symptoms persist.

## 2023-05-26 NOTE — Progress Notes (Signed)
 [facility] Telemedicine Visit  Patient consented to have virtual visit and was identified by name and date of birth. Method of visit: Telephone  Encounter participants: Patient: Desiree Parker - located at home ([address] [employer] [place], [place]) Provider: Darral Dash - located at [facility] ([address] Others (if applicable): none  Chief Complaint: Cough  HPI: Reports dry cough for 1 week. Denies fever, sputum, rhinorrhea, chills. Using Nyquil and cough drops and some other things she got from the drug store. No loss of appetite, nausea, vomiting, diarrhea No recent travel  Sick contacts: Church member who had cough for past couple of weeks  ROS: per HPI  Pertinent PMHx:  Hx CHF- Last echo normal EF with G1DD OSA COPD (hx 56 pack year history)- Takes Breo Ellipta HTN  Exam:  There were no vitals taken for this visit.  Respiratory: Normal effort on room air  Assessment/Plan:  Cough Acute cough for 7 days. Multiple possible causes of her dry cough including ACE inhibitor induced, CHF exacerbation, COPD exacerbation although she does not meet cardinal symptoms for this at this time. Did have sick contact at church with cough- could have viral illness (COVID, Flu, RSV) Encouraged to avoid combo cold/flu medicines such as Nyquil. Encouraged honey and warm water/hydration. Discussed return precautions should she develop purulent sputum, fever, chills, worsening of symptoms. Reassuringly, she is scheduled for CT low-dose lung screening on 2/19. In- person office visit for physical exam to further delineate cause of her cough should symptoms persist.    Time spent during visit with patient: 10 minutes

## 2023-05-27 ENCOUNTER — Other Ambulatory Visit: Payer: 59

## 2023-06-01 ENCOUNTER — Ambulatory Visit (HOSPITAL_COMMUNITY): Payer: 59

## 2023-06-08 ENCOUNTER — Ambulatory Visit (HOSPITAL_COMMUNITY)
Admission: RE | Admit: 2023-06-08 | Discharge: 2023-06-08 | Disposition: A | Discharge status: Home / Self Care | Payer: 59 | Source: Ambulatory Visit | Attending: Family Medicine | Admitting: Family Medicine

## 2023-06-08 DIAGNOSIS — Z87891 Personal history of nicotine dependence: Secondary | ICD-10-CM | POA: Diagnosis not present

## 2023-06-08 DIAGNOSIS — F17210 Nicotine dependence, cigarettes, uncomplicated: Secondary | ICD-10-CM | POA: Diagnosis not present

## 2023-06-08 DIAGNOSIS — Z122 Encounter for screening for malignant neoplasm of respiratory organs: Secondary | ICD-10-CM | POA: Insufficient documentation

## 2023-06-08 DIAGNOSIS — I700 Atherosclerosis of aorta: Secondary | ICD-10-CM | POA: Insufficient documentation

## 2023-06-17 ENCOUNTER — Ambulatory Visit (INDEPENDENT_AMBULATORY_CARE_PROVIDER_SITE_OTHER): Payer: 59 | Admitting: Family Medicine

## 2023-06-17 ENCOUNTER — Encounter: Payer: Self-pay | Admitting: Family Medicine

## 2023-06-17 DIAGNOSIS — I10 Essential (primary) hypertension: Secondary | ICD-10-CM

## 2023-06-17 DIAGNOSIS — J449 Chronic obstructive pulmonary disease, unspecified: Secondary | ICD-10-CM | POA: Diagnosis not present

## 2023-06-17 DIAGNOSIS — E559 Vitamin D deficiency, unspecified: Secondary | ICD-10-CM | POA: Diagnosis not present

## 2023-06-17 MED ORDER — LISINOPRIL 20 MG PO TABS: 20.0000 mg | ORAL_TABLET | Freq: Every day | ORAL | 1 refills | Status: DC

## 2023-06-17 MED ORDER — AMLODIPINE BESYLATE 10 MG PO TABS: 10.0000 mg | ORAL_TABLET | Freq: Every day | ORAL | 1 refills | Status: DC

## 2023-06-17 NOTE — Patient Instructions (Signed)
 Hypercalcemia Hypercalcemia is when the level of calcium in a person's blood is above normal. The body needs calcium to make bones and keep them strong. Calcium also helps the muscles, nerves, brain, and heart work the way they should. Most of the calcium in the body is stored in the bones. There is also calcium in the blood. Hypercalcemia occurs when there is too much calcium in your blood. Calcium levels in the blood are regulated by hormones, kidneys, and the gastrointestinal tract.  Hypercalcemia can happen when calcium comes out of the bones, or when the kidneys are not able to remove calcium from the blood. Hypercalcemia can be mild or severe. What are the causes? There are many possible causes of hypercalcemia. Common causes of this condition include: Hyperparathyroidism. This is a condition in which the body produces too much parathyroid hormone. There are four parathyroid glands in your neck. These glands produce a chemical messenger (hormone) that helps the body absorb calcium from foods and helps your bones release calcium. Certain kinds of cancer. Less common causes of hypercalcemia include: Calcium and vitamin D dietary supplements. Chronic kidney disease. Hyperthyroidism. Severe dehydration. Being on bed rest or being inactive for a long time. Certain medicines. Infections. What increases the risk? You are more likely to develop this condition if: You are female. You are 42 years of age or older. You have a family history of hypercalcemia. What are the signs or symptoms? Mild hypercalcemia that starts slowly may not cause symptoms. Severe, sudden hypercalcemia is more likely to cause symptoms, such as: Being more thirsty than usual. Needing to urinate more often than usual. Abdominal pain. Nausea and vomiting. Constipation. Muscle pain, twitching, or weakness. Feeling very tired. How is this diagnosed?  Hypercalcemia is usually diagnosed with a blood test. You may also  have tests to help check what is causing this condition. Tests include imaging tests and more blood tests. How is this treated? Treatment for hypercalcemia depends on the cause. Treatment may include: Receiving fluids through an IV. Medicines. These can be used to: Keep calcium levels steady after receiving fluids (loop diuretics). Keep calcium in your bones (bisphosphonates). Lower the calcium level in your blood. Surgery to remove overactive parathyroid glands. A procedure that filters your blood to correct calcium levels (hemodialysis). Follow these instructions at home:  Take over-the-counter and prescription medicines only as told by your health care provider. Follow instructions from your health care provider about eating or drinking restrictions. Drink enough fluid to keep your urine pale yellow. Stay active. Weight-bearing exercise helps to keep calcium in your bones. Follow instructions from your health care provider about what type and level of exercise is safe for you. Keep all follow-up visits. This is important. Contact a health care provider if: You have a fever. Your heartbeat is irregular or very fast. You have changes in mood, memory, or personality. Get help right away if: You have severe abdominal pain. You have chest pain. You have trouble breathing. You become very confused and sleepy. You lose consciousness. These symptoms may represent a serious problem that is an emergency. Do not wait to see if the symptoms will go away. Get medical help right away. Call your local emergency services (911 in the [place] Do not drive yourself to the hospital. Summary Hypercalcemia is when the level of calcium in a person's blood is above normal. The body needs calcium to make bones and keep them strong. There are many possible causes of hypercalcemia, and treatment depends on  the cause. Take over-the-counter and prescription medicines only as told by your health care  provider. This information is not intended to replace advice given to you by your health care provider. Make sure you discuss any questions you have with your health care provider. Document Revised: 09/03/2020 Document Reviewed: 09/03/2020 Elsevier Patient Education  2024 [employer].

## 2023-06-17 NOTE — Progress Notes (Signed)
    SUBJECTIVE:   CHIEF COMPLAINT / HPI:   HTN: She is compliant with Lisinopril and Amlodipine. She ran out of her Lisinopril and need a refill.   Hypercalcemia: She is currently not taking her Oscal D. However, she has two sets of MVI. Her son mentioned she does not take both but one of them.   COPD: She uses her albuterol prn and is out of [employer]. Otherwise, no concerns.    PERTINENT  PMH / PSH: PMHx reviewed  OBJECTIVE:   BP 139/70   Pulse (!) 105   Ht 4\' 11"  (1.499 m)   Wt 201 lb 4 oz (91.3 kg)   SpO2 97%   BMI 40.65 kg/m   Physical Exam Vitals reviewed.  Cardiovascular:     Rate and Rhythm: Normal rate and regular rhythm.     Heart sounds: No murmur heard. Pulmonary:     Effort: Pulmonary effort is normal. No respiratory distress.     Breath sounds: Normal breath sounds. No wheezing or rhonchi.  Musculoskeletal:     Cervical back: Neck supple.      ASSESSMENT/PLAN:   HYPERTENSION, BENIGN SYSTEMIC BP looks good today. Med refilled  Hypercalcemia Likely medication induced I advised her to not take her two different MVI - Son assured me she does not Continue to hold oscal D - not in her medication bag this morning Bmet - rechecked Also checked iPTH and vitamin D levels I will contact her soon with her test results  COPD (chronic obstructive pulmonary disease) (HCC) Med refilled     Janit Pagan, MD [facility]

## 2023-06-17 NOTE — Assessment & Plan Note (Signed)
 Likely medication induced I advised her to not take her two different MVI - Son assured me she does not Continue to hold oscal D - not in her medication bag this morning Bmet - rechecked Also checked iPTH and vitamin D levels I will contact her soon with her test results

## 2023-06-17 NOTE — Assessment & Plan Note (Signed)
BP looks good today. Med refilled.

## 2023-06-17 NOTE — Assessment & Plan Note (Signed)
 Med refilled.

## 2023-06-18 ENCOUNTER — Other Ambulatory Visit: Payer: Self-pay | Admitting: Internal Medicine

## 2023-06-18 LAB — BASIC METABOLIC PANEL
BUN/Creatinine Ratio: 11 — ABNORMAL LOW (ref 12–28)
BUN: 10 mg/dL (ref 8–27)
CO2: 23 mmol/L (ref 20–29)
Calcium: 9.7 mg/dL (ref 8.7–10.3)
Chloride: 99 mmol/L (ref 96–106)
Creatinine, Ser: 0.87 mg/dL (ref 0.57–1.00)
Glucose: 191 mg/dL — ABNORMAL HIGH (ref 70–99)
Potassium: 4.2 mmol/L (ref 3.5–5.2)
Sodium: 140 mmol/L (ref 134–144)
eGFR: 69 mL/min/{1.73_m2} (ref >59)

## 2023-06-18 LAB — VITAMIN D 25 HYDROXY (VIT D DEFICIENCY, FRACTURES): Vit D, 25-Hydroxy: 58.3 ng/mL (ref 30.0–100.0)

## 2023-06-18 LAB — PARATHYROID HORMONE, INTACT (NO CA): PTH: 39 pg/mL (ref 15–65)

## 2023-06-20 ENCOUNTER — Telehealth: Payer: Self-pay | Admitting: Family Medicine

## 2023-06-20 NOTE — Telephone Encounter (Signed)
 Test result discussed with Desiree Parker. Calcium level is back to normal. All test are reassuring except for her Glucose which is up. Monitor closely for now.  Hold calcium supplements, Continue MVI and Vit D supplements. F/U in 3-4 months.  All questions were answered.

## 2023-06-20 NOTE — Telephone Encounter (Signed)
 Patient calls nurse line reporting she missed a phone call earlier.   Advised PCP discussed recent results with Ray, her son.   She reports he has not called her yet and she would like to know results.   Results discussed with patient.   Hold calcium supplements and continue with MVI and Vit D.

## 2023-06-23 ENCOUNTER — Other Ambulatory Visit: Payer: Self-pay | Admitting: Family Medicine

## 2023-06-23 DIAGNOSIS — K219 Gastro-esophageal reflux disease without esophagitis: Secondary | ICD-10-CM

## 2023-06-28 ENCOUNTER — Other Ambulatory Visit: Payer: 59

## 2023-07-01 ENCOUNTER — Telehealth: Payer: Self-pay | Admitting: Family Medicine

## 2023-07-01 NOTE — Telephone Encounter (Signed)
 HIPAA compliant callback message left for [facility].  I called and spoke with Ms. Desiree Parker about her CT lungs. Repeat test in a year.   CT lung cancer screening: Negative for malignancy. She has arterial calcification which is not new and she is on appropriate medications. All questions were answered.

## 2023-07-14 ENCOUNTER — Other Ambulatory Visit: Payer: Self-pay | Admitting: Family Medicine

## 2023-07-14 DIAGNOSIS — Z1231 Encounter for screening mammogram for malignant neoplasm of breast: Secondary | ICD-10-CM

## 2023-08-03 ENCOUNTER — Encounter: Payer: Self-pay | Admitting: Podiatry

## 2023-08-03 ENCOUNTER — Ambulatory Visit (INDEPENDENT_AMBULATORY_CARE_PROVIDER_SITE_OTHER): Payer: 59 | Admitting: Podiatry

## 2023-08-03 DIAGNOSIS — M2011 Hallux valgus (acquired), right foot: Secondary | ICD-10-CM | POA: Diagnosis not present

## 2023-08-03 DIAGNOSIS — M79674 Pain in right toe(s): Secondary | ICD-10-CM

## 2023-08-03 DIAGNOSIS — E1151 Type 2 diabetes mellitus with diabetic peripheral angiopathy without gangrene: Secondary | ICD-10-CM | POA: Diagnosis not present

## 2023-08-03 DIAGNOSIS — E119 Type 2 diabetes mellitus without complications: Secondary | ICD-10-CM

## 2023-08-03 DIAGNOSIS — B351 Tinea unguium: Secondary | ICD-10-CM

## 2023-08-03 DIAGNOSIS — M2012 Hallux valgus (acquired), left foot: Secondary | ICD-10-CM | POA: Diagnosis not present

## 2023-08-03 DIAGNOSIS — M79675 Pain in left toe(s): Secondary | ICD-10-CM
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
## 2023-08-05 ENCOUNTER — Other Ambulatory Visit

## 2023-08-15 ENCOUNTER — Ambulatory Visit

## 2023-08-15 ENCOUNTER — Ambulatory Visit: Admitting: Family Medicine

## 2023-08-30 ENCOUNTER — Ambulatory Visit
Admission: RE | Admit: 2023-08-30 | Discharge: 2023-08-30 | Disposition: A | Discharge status: Home / Self Care | Source: Ambulatory Visit | Attending: Family Medicine | Admitting: Family Medicine

## 2023-08-30 DIAGNOSIS — Z1231 Encounter for screening mammogram for malignant neoplasm of breast: Secondary | ICD-10-CM | POA: Diagnosis not present

## 2023-09-08 ENCOUNTER — Other Ambulatory Visit: Payer: Self-pay | Admitting: Family Medicine

## 2023-09-08 DIAGNOSIS — J4489 Other specified chronic obstructive pulmonary disease: Secondary | ICD-10-CM

## 2023-09-13 ENCOUNTER — Ambulatory Visit: Admitting: Family Medicine

## 2023-09-21 ENCOUNTER — Other Ambulatory Visit: Payer: Self-pay | Admitting: Family Medicine

## 2023-09-23 ENCOUNTER — Other Ambulatory Visit: Payer: Self-pay | Admitting: Family Medicine

## 2023-09-23 DIAGNOSIS — Z91148 Patient's other noncompliance with medication regimen for other reason: Secondary | ICD-10-CM

## 2023-09-27 ENCOUNTER — Encounter: Payer: Self-pay | Admitting: Family Medicine

## 2023-09-27 ENCOUNTER — Ambulatory Visit: Admitting: Family Medicine

## 2023-09-27 DIAGNOSIS — G8929 Other chronic pain: Secondary | ICD-10-CM

## 2023-09-27 DIAGNOSIS — M25561 Pain in right knee: Secondary | ICD-10-CM | POA: Diagnosis not present

## 2023-09-27 DIAGNOSIS — R5381 Other malaise: Secondary | ICD-10-CM

## 2023-09-27 MED ORDER — IBUPROFEN 400 MG PO TABS: 400.0000 mg | ORAL_TABLET | Freq: Two times a day (BID) | ORAL | 1 refills | Status: AC | PRN

## 2023-09-27 NOTE — Patient Instructions (Signed)
 Chronic Knee Pain, Adult Knee pain that lasts longer than 3 months is called chronic knee pain. You may have pain in one or both knees. Symptoms of chronic knee pain may also include swelling and stiffness. Many conditions can cause chronic knee pain. The most common cause is wear and tear of your knee joint as you get older. Other possible causes include: A disease that causes inflammation of the knee, such as rheumatoid arthritis. This usually affects both knees. A condition called inflammatory arthritis, such as gout. An injury to the knee that causes arthritis. An injury to the knee that damages the ligaments. Ligaments are tissues that connect bones to each other. Runner's knee or pain behind the kneecap. Treatment for chronic knee pain depends on the cause. The main treatments for chronic knee pain are: Doing exercises to help your knee move better and get stronger, called physical therapy. Losing weight if you are overweight. This condition may also be treated with medicines, injections, a knee sleeve or brace, and by using crutches. You health care provider may also recommend rest, ice, pressure (compression), and elevation, also called RICE therapy. Follow these instructions at home: If you have a knee sleeve or brace that can be taken off:  Wear the knee sleeve or brace as told by your provider. Take it off only if your provider says that you can. Check the skin around it every day. Tell your provider if you see problems. Loosen the knee sleeve or brace if your toes tingle, are numb, or turn cold and blue. Keep the knee sleeve or brace clean and dry. Bathing If the knee sleeve or brace is not waterproof: Do not let it get wet. Cover it when you take a bath or shower. Use a cover that does not let any water in. Managing pain, stiffness, and swelling     If told, put heat on the area. Do this as often as told. Use the heat source that your provider recommends, such as a moist  heat pack or a heating pad. If you have a knee sleeve or brace that you can take off, remove it as told. Place a towel between your skin and the heat source. Leave the heat on for 20-30 minutes. If told, put ice on the area. If you have a knee sleeve or brace that you can take off, remove it as told. Put ice in a plastic bag. Place a towel between your skin and the bag. Leave the ice on for 20 minutes, 2-3 times a day. If your skin turns bright red, remove the ice or heat right away to prevent skin damage. The risk of damage is higher if you cannot feel pain, heat, or cold. Move your toes often to reduce stiffness and swelling. Raise the injured area above the level of your heart while you are sitting or lying down. Use a pillow to support your foot as needed. Activity Avoid activities where both feet leave the ground at the same time. Avoid running, jumping rope, and doing jumping jacks. Follow the exercise plan that your provider made for you. Your provider may suggest that you: Avoid activities that make knee pain worse. This may mean that you need to change your exercise routines, sports, or job duties. Wear shoes with cushioned soles. Avoid sports that require running and sudden changes in direction. Do physical therapy. Physical therapy helps your knee move better and get stronger. Exercise as told. Do exercises that increase balance and strength, such as  tai chi and yoga. Do not stand or walk on your injured knee until you're told it's okay. Use crutches as told. Return to normal activities when you're told. Ask what things are safe for you to do. General instructions Take your medicines only as told by your provider. If you are overweight, work with your provider and an expert in healthy eating called a dietitian to set goals to lose weight. Losing even a little weight can reduce knee pain. Being overweight can make your knee hurt more. Do not smoke, vape, or use products with  nicotine or tobacco in them. If you need help quitting, talk with your provider. Keep all follow-up visits. Your provider will monitor your pain and try other treatments if needed. Contact a health care provider if: You have knee pain that is not getting better or gets worse. You are not able to do your exercises due to knee pain. Get help right away if: Your knee swells and the swelling becomes worse. You cannot move your knee. You have severe knee pain. This information is not intended to replace advice given to you by your health care provider. Make sure you discuss any questions you have with your health care provider. Document Revised: 12/30/2022 Document Reviewed: 05/24/2022 Elsevier Patient Education  2024 [employer].

## 2023-09-27 NOTE — Assessment & Plan Note (Signed)
 Contributory to her deconditioning in addition to other chronic problems Home Health Aide ordered However, I feel what they are really needing is a personal care service I discussed referral to [facility] which both patient and son agreed with Referral placed

## 2023-09-27 NOTE — Progress Notes (Signed)
  [facility] Telemedicine Visit  Patient consented to have virtual visit and was identified by name and date of birth. Method of visit: Telephone  Encounter participants: Patient: Desiree Parker - located at Home Provider: Penni Bowman - located at [facility] office Others (if applicable): Son located at work  Stage manager Complaint: Knee pain and need for home Health Aide  HPI:  Deconditioning: The patient's son was unaware of her in-person appointment until a few minutes before the appointment. Hence, this was transitioned to a telephone visit. I called both the patient and the son separately for this visit. Son indicated that mom is having more difficulty helping herself at home with house chores and sometimes her medications. He denies a recent fall at home. However, he is getting more concerned about her since she lives alone. He requested Home Health Aide assistance for his mother, given her hx of SOB and joint pain.   Desiree Parker agreed with her son that she is having more difficulty caring for herself at home during the day and felt she would benefit from [facility] assistance as well.   Knee pain: Patient c/o right knee pain since 03/28/24. Her knee often gives out when she stands on it for a prolonged period. She denies any recent falls or trauma to her knees. Her knee gave out at church 2 weeks ago when she nearly fell. Her right knee pain currently is about 9/10 in severity. She uses OTC Biofreeze on it, but there is minimal improvement. She denies left knee pain. She had physical therapy in the past and does not want it at this time.    ROS: per HPI  Pertinent PMHx: PMHx reviewed  Exam:  There were no vitals taken for this visit.  Respiratory: Could complete full sentences with no difficulty  Assessment/Plan:  Chronic knee pain Contributory to her deconditioning in addition to other chronic problems Home Health Aide ordered However, I feel what they are really  needing is a personal care service I discussed referral to [facility] which both patient and son agreed with Referral placed  Physical deconditioning Due to chronic health problems  Referral to [facility] to help coordinate HHA and or personal care services Son wanted to be contacted for her care and PCS coordination    Time spent during visit with patient: 30 minutes

## 2023-09-27 NOTE — Assessment & Plan Note (Signed)
 Due to chronic health problems  Referral to [facility] to help coordinate HHA and or personal care services Son wanted to be contacted for her care and PCS coordination

## 2023-10-05 ENCOUNTER — Telehealth: Payer: Self-pay | Admitting: *Deleted

## 2023-10-05 NOTE — Progress Notes (Signed)
 Complex Care Management Note  Care Guide Note 10/05/2023 Name: Desiree Parker MRN: [id number] DOB: 1946-04-08  Desiree Parker is a 78 y.o. year old female who sees Anders Otto DASEN, MD for primary care. I reached out to Madison JONETTA Bathe by phone today to offer complex care management services.  [name] was given information about Complex Care Management services today including:   The Complex Care Management services include support from the care team which includes your Nurse Care Manager, Clinical Social Worker, or Pharmacist.  The Complex Care Management team is here to help remove barriers to the health concerns and goals most important to you. Complex Care Management services are voluntary, and the patient may decline or stop services at any time by request to their care team member.   Complex Care Management Consent Status: Patient agreed to services and verbal consent obtained.   Follow up plan:  Telephone appointment with complex care management team member scheduled for:  10/21/23  Encounter Outcome:  Patient Scheduled  Harlene Satterfield  [facility]  [facility], [facility] Guide  Direct Dial: [phone]  Fax [phone]

## 2023-10-21 ENCOUNTER — Telehealth: Payer: Self-pay

## 2023-11-08 ENCOUNTER — Encounter: Payer: Self-pay | Admitting: Podiatry

## 2023-11-08 ENCOUNTER — Ambulatory Visit (INDEPENDENT_AMBULATORY_CARE_PROVIDER_SITE_OTHER): Admitting: Podiatry

## 2023-11-08 DIAGNOSIS — M79674 Pain in right toe(s): Secondary | ICD-10-CM | POA: Diagnosis not present

## 2023-11-08 DIAGNOSIS — B351 Tinea unguium: Secondary | ICD-10-CM | POA: Diagnosis not present

## 2023-11-08 DIAGNOSIS — E1151 Type 2 diabetes mellitus with diabetic peripheral angiopathy without gangrene: Secondary | ICD-10-CM | POA: Diagnosis not present

## 2023-11-08 DIAGNOSIS — M2011 Hallux valgus (acquired), right foot: Secondary | ICD-10-CM

## 2023-11-08 DIAGNOSIS — M79675 Pain in left toe(s): Secondary | ICD-10-CM

## 2023-11-08 DIAGNOSIS — M2012 Hallux valgus (acquired), left foot: Secondary | ICD-10-CM

## 2023-11-11 ENCOUNTER — Telehealth: Payer: Self-pay | Admitting: *Deleted

## 2023-11-11 NOTE — Progress Notes (Signed)
  Subjective:  Patient ID: Desiree Parker, female    DOB: 1945-08-20,  MRN: [id number]  Desiree Parker presents to clinic today for at risk foot care. Pt has h/o NIDDM with PAD and painful thick toenails that are difficult to trim. Pain interferes with ambulation. Aggravating factors include wearing enclosed shoe gear. Pain is relieved with periodic professional debridement.  Chief Complaint  Patient presents with   [facility]    Rm17 Diabetic foot care/ DR. Eniola last visit June 2025/A1c 7   New problem(s): None.   PCP is Anders Otto DASEN, MD.  No Known Allergies  Review of Systems: Negative except as noted in the HPI.  Objective: No changes noted in today's physical examination. There were no vitals filed for this visit. Desiree Parker is a pleasant 78 y.o. female in NAD. AAO x 3.  Vascular Examination: CFT <3 seconds b/l. DP pulses faintly palpable b/l. PT pulses nonpalpable b/l. Digital hair absent. Skin temperature gradient warm to warm b/l. No pain with calf compression. No ischemia or gangrene. No cyanosis or clubbing noted b/l.    Neurological Examination: Sensation grossly intact b/l with 10 gram monofilament.  Dermatological Examination: Pedal skin warm and supple b/l. No open wounds b/l. No interdigital macerations. Toenails 1-5 b/l thick, discolored, elongated with subungual debris and pain on dorsal palpation.  No hyperkeratotic nor porokeratotic lesions present on today's visit.  Musculoskeletal Examination: Muscle strength 5/5 to all lower extremity muscle groups bilaterally. HAV with bunion deformity noted b/l LE. Utilizes cane for ambulation assistance.  Radiographs: None  Assessment/Plan: Orders Placed This Encounter  Procedures   For home use only DME Other see comment    Dispense one pair extra depth shoes and 3 pair heat moldable insoles.    Length of Need:   12 Months    1. Pain due to onychomycosis of toenails of both feet   2. Hallux valgus, acquired,  bilateral   3. Type II diabetes mellitus with peripheral circulatory disorder [facility])     Consent given for treatment. Patient examined. All patient's and/or POA's questions/concerns addressed on today's visit. Toenails 1-5 debrided in length and girth without incident. Continue foot and shoe inspections daily. Monitor blood glucose per PCP/Endocrinologist's recommendations. Continue soft, supportive shoe gear daily. Report any pedal injuries to medical professional. Call office if there are any questions/concerns. -Patient/POA to call should there be question/concern in the interim.   Return in about 3 months (around 02/08/2024).  Delon LITTIE Merlin, DPM      [place] LOCATION: [address] [place] [address]                   Office [phone]   [facility] LOCATION: [address] [place], [place] [address] Office [phone]

## 2023-11-11 NOTE — Progress Notes (Signed)
 Complex Care Management Care Guide Note  11/11/2023 Name: Desiree Parker MRN: [id number] DOB: 05/19/1945  Desiree Parker is a 78 y.o. year old female who is a primary care patient of Anders Otto DASEN, MD and is actively engaged with the care management team. I reached out to Desiree Parker by phone today to assist with re-scheduling  with the RN Case Manager.  Follow up plan: Unsuccessful telephone outreach attempt made. A HIPAA compliant phone message was left for the patient providing contact information and requesting a return call.  Harlene Satterfield  [facility]  [facility], [facility] Guide  Direct Dial: [phone]  Fax [phone]

## 2023-11-11 NOTE — Progress Notes (Signed)
Please see attached. Thank you

## 2023-11-15 NOTE — Progress Notes (Signed)
 Complex Care Management Care Guide Note  11/15/2023 Name: [name] MRN: [id number] DOB: 05/07/1945  Desiree Parker is a 78 y.o. year old female who is a primary care patient of Anders Otto DASEN, MD and is actively engaged with the care management team. I reached out to Maigen D Treloar by phone today to assist with re-scheduling  with the RN Case Manager.  Follow up plan: Telephone appointment with complex care management team member scheduled for:  8/21  Harlene Satterfield  [facility] Health  [facility], [facility] Guide  Direct Dial: [phone]  Fax [phone]

## 2023-12-01 ENCOUNTER — Other Ambulatory Visit: Payer: Self-pay

## 2023-12-01 ENCOUNTER — Telehealth: Payer: Self-pay

## 2023-12-01 ENCOUNTER — Other Ambulatory Visit: Payer: Self-pay | Admitting: *Deleted

## 2023-12-01 DIAGNOSIS — I509 Heart failure, unspecified: Secondary | ICD-10-CM

## 2023-12-01 DIAGNOSIS — G8929 Other chronic pain: Secondary | ICD-10-CM

## 2023-12-01 DIAGNOSIS — J441 Chronic obstructive pulmonary disease with (acute) exacerbation: Secondary | ICD-10-CM

## 2023-12-01 DIAGNOSIS — R5381 Other malaise: Secondary | ICD-10-CM

## 2023-12-01 DIAGNOSIS — G4733 Obstructive sleep apnea (adult) (pediatric): Secondary | ICD-10-CM

## 2023-12-01 DIAGNOSIS — J449 Chronic obstructive pulmonary disease, unspecified: Secondary | ICD-10-CM

## 2023-12-01 NOTE — Patient Instructions (Signed)
 Visit Information  Thank you for taking time to visit with me today. Please don't hesitate to contact me if I can be of assistance to you before our next scheduled appointment.  Our next appointment is by telephone on 12/15/23 at 10 am Please call the care guide team at [phone] if you need to cancel or reschedule your appointment.   Following is a copy of your care plan:   Goals Addressed             This Visit's Progress    [facility] RN Care Plan-COPD       Problems:  Chronic Disease Management support and education needs related to COPD  Goal: Over the next 90 days the Patient will attend all scheduled medical appointments: with PCP and Specialist as evidenced by keeping all schedule appointments.         continue to work with Medical illustrator and/or Social Worker to address care management and care coordination needs related to COPD as evidenced by adherence to care management team scheduled appointments     take all medications exactly as prescribed and will call provider for medication related questions as evidenced by compliance    verbalize basic understanding of COPD disease process and self health management plan as evidenced by verbal explanation, recognize, monitor symptoms and life style changes.  work with Child psychotherapist to Loss adjuster, chartered related to the management of COPD as evidenced by review of electronic medical record and patient or social worker report      Interventions:   COPD Interventions: Advised patient to track and manage COPD triggers Advised patient to self assesses COPD action plan zone and make appointment with provider if in the yellow zone for 48 hours without improvement Assessed social determinant of health barriers Discussed the importance of adequate rest and management of fatigue with COPD Provided education about and advised patient to utilize infection prevention strategies to reduce risk of respiratory infection Provided patient with basic  written and verbal COPD education on self care/management/and exacerbation prevention Screening for signs and symptoms of depression related to chronic disease state  Will refer to BSW for transportation assistance and community resources.  Will assist with arranging [facility] services for [facility] RN, in home aid and SW.  Will provider resources for Personal Care services.    Patient Self-Care Activities:  Attend all scheduled provider appointments Attend church or other social activities Call pharmacy for medication refills 3-7 days in advance of running out of medications Call provider office for new concerns or questions  Take medications as prescribed   Work with the social worker to address care coordination needs and will continue to work with the clinical team to address health care and disease management related needs identify and avoid work-related triggers identify and remove indoor air pollutants limit outdoor activity during cold weather listen for public air quality announcements every day do breathing exercises every day eliminate symptom triggers at home follow rescue plan if symptoms flare-up use devices that will help like a cane, sock-puller or reacher do breathing exercises every day  Plan:  Telephone follow up appointment with care management team member scheduled for:  12/15/23 10 am          [facility]I RN Care Plan-DM       Problems:  Chronic Disease Management support and education needs related to DMII  Goal: Over the next 90 days the Patient will attend all scheduled medical appointments: with PCP- 02/22/24 and Specialist as evidenced by keeping  all schedule appointments.         continue to work with Medical illustrator and/or Social Worker to address care management and care coordination needs related to DMII as evidenced by adherence to care management team scheduled appointments     take all medications exactly as prescribed and will call provider for medication related  questions as evidenced by compliance.    verbalize basic understanding of DMII disease process and self health management plan as evidenced by verbal explanation, recognize, monitor symptoms / life style changes. work with [employer] care guide to address needs related to Transportation as evidenced by patient and/or community resource care guide support     Interventions:   Diabetes Interventions: Assessed patient's understanding of A1c goal: <7% Provided education to patient about basic DM disease process Reviewed medications with patient and discussed importance of medication adherence Counseled on importance of regular laboratory monitoring as prescribed Discussed plans with patient for ongoing care management follow up and provided patient with direct contact information for care management team Provided patient with written educational materials related to hypo and hyperglycemia and importance of correct treatment Referral made to social work team for assistance with transportation needs.   Screening for signs and symptoms of depression related to chronic disease state  Assessed social determinant of health barriers Lab Results  Component Value Date   HGBA1C 7.1 (A) 05/13/2023    Patient Self-Care Activities:  Attend all scheduled provider appointments Attend church or other social activities Call pharmacy for medication refills 3-7 days in advance of running out of medications Call provider office for new concerns or questions  Take medications as prescribed   Work with the social worker to address care coordination needs and will continue to work with the clinical team to address health care and disease management related needs keep appointment with eye doctor check blood sugar at prescribed times: before meals and at bedtime check feet daily for cuts, sores or redness enter blood sugar readings and medication or insulin  into daily log limit fast food meals to no more  than 1 per week keep feet up while sitting  Plan:  Telephone follow up appointment with care management team member scheduled for:  12/15/23             Please call the Suicide and Crisis Lifeline: 988 call the [place]  National Suicide Prevention Lifeline: 667-463-4661 or TTY: 316-445-2407 TTY 204-673-1019) to talk to a trained counselor call 1-800-273-TALK (toll free, 24 hour hotline) go to [facility] [address], [place] [phone]) call the [facility] Crisis Line: 703-712-5707 call 911 if you are experiencing a Mental Health or Behavioral Health Crisis or need someone to talk to.  Patient verbalizes understanding of instructions and care plan provided today and agrees to view in MyChart. Active MyChart status and patient understanding of how to access instructions and care plan via MyChart confirmed with patient.     Jatara Huettner, RN, BSN, Theatre manager [employer] [phone]

## 2023-12-01 NOTE — Progress Notes (Signed)
  Order placed   ..............................................................SABRA McLaurin, Faye A, RN  Anders Otto DASEN, MD Good morning Dr. Anders  I hope that this messages finds you well.  Patient's son has requested an order for Home Health RN, in-home aid, and SW for community resources.  Do you mind to place an order and I will be happy to arrange.  I have also placed a referral for [facility] BSW to consult on Desiree Parker.  Thank you,  Faye Mclaurin, RN, BSN, ACM RN Care Manager [employer] [phone]

## 2023-12-01 NOTE — Patient Outreach (Signed)
 Complex Care Management   Visit Note  12/01/2023  Name:  Desiree Parker MRN: [id number] DOB: 01-28-1946  Situation: Referral received for Complex Care Management related to Diabetes with Complications I obtained verbal consent from Patient and son.  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  There is infiltrate consistent with pneumonia in portions of the right lower lobe. There is patchy atelectasis in the left lower lobe. There is no appreciable adenopathy. The prominence in the right hilum noted previously is apparently due to vascular prominence as opposed to mass or adenopathy. 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   Noncompliance with medications 09/11/2015   Osteoporosis    TOBACCO USER 03/04/2009   Snuff dipper      Assessment: Patient Reported Symptoms:  Cognitive Cognitive Status: Alert and oriented to person, place, and time, Insightful and able to interpret abstract concepts, Normal speech and language skills Cognitive/Intellectual Conditions Management [RPT]: None reported or documented in medical history or problem list   Health Maintenance Behaviors: Annual physical exam, Stress management Healing Pattern: Average Health Facilitated  by: Prayer/meditation, Stress management, Rest  Neurological Neurological Review of Symptoms: No symptoms reported Neurological Management Strategies: Routine screening Neurological Self-Management Outcome: 3 (uncertain)  HEENT HEENT Symptoms Reported: No symptoms reported HEENT Self-Management Outcome: 4 (good)    Cardiovascular Cardiovascular Symptoms Reported: Swelling in legs or feet Does patient have uncontrolled Hypertension?: No Cardiovascular Management Strategies: Routine screening Cardiovascular Self-Management Outcome: 4 (good)  Respiratory Respiratory Symptoms Reported: Shortness of breath, Wheezing Other Respiratory Symptoms: Reports that she has shortness of breath and wheezing with ambulation and activity. Reports that she will use the albuterol  inhaler as needed. Respiratory Management Strategies: Routine screening, Adequate rest Respiratory Self-Management Outcome: 3 (uncertain)  Endocrine Endocrine Symptoms Reported: No symptoms reported Is patient diabetic?: Yes Is patient checking blood sugars at home?: Yes List most recent blood sugar readings, include date and time of day: Patient reports blood sugar 11-30-23 was 125. Endocrine Comment: reports highest BS 140, she is unsure of what lowest BS was.  Gastrointestinal Gastrointestinal Symptoms Reported: Diarrhea (Reports that she had diarrhea last week.  Patient reports that diarrhea has resolved.) Gastrointestinal Management Strategies: Adequate rest, Diet modification Gastrointestinal Self-Management Outcome: 3 (uncertain)    Genitourinary Genitourinary Symptoms Reported: No symptoms reported Genitourinary Management Strategies: Adequate rest Genitourinary Self-Management Outcome: 4 (good)  Integumentary Integumentary Symptoms Reported: No symptoms reported Skin Management Strategies: Routine screening Skin Self-Management Outcome: 4 (good)  Musculoskeletal Musculoskelatal Symptoms Reviewed: Limited  mobility Additional Musculoskeletal Details: Patient reports that she uses walker and cane to assist with limited mobility.  Has occasional knee problems, but reports pain level a 0/10 on pain scale. Musculoskeletal Management Strategies: Routine screening, Medication therapy Falls  in the past year?: Yes Number of falls in past year: 1 or less Was there an injury with Fall?: No Fall Risk Category Calculator: 1 Patient Fall Risk Level: Low Fall Risk Patient at Risk for Falls Due to: History of fall(s), Impaired mobility, Impaired balance/gait Fall risk Follow up: Falls evaluation completed, Education provided, Falls prevention discussed  Psychosocial Psychosocial Symptoms Reported: No symptoms reported Behavioral Management Strategies: Support system Behavioral Health Self-Management Outcome: 4 (good) Major Change/Loss/Stressor/Fears (CP): Denies Techniques to Cope with Loss/Stress/Change: Support group Quality of Family Relationships: helpful, involved, stressful Do you feel physically threatened by others?: No    12/01/2023    PHQ2-9 Depression Screening   Little interest or pleasure in doing things Not at all  Feeling down, depressed, or hopeless Not at all  PHQ-2 - Total Score 0  Trouble falling or staying asleep, or sleeping too much    Feeling tired or having little energy    Poor appetite or overeating     Feeling bad about yourself - or that you are a failure or have let yourself or your family down    Trouble concentrating on things, such as reading the newspaper or watching television    Moving or speaking so slowly that other people could have noticed.  Or the opposite - being so fidgety or restless that you have been moving around a lot more than usual    Thoughts that you would be better off dead, or hurting yourself in some way    PHQ2-9 Total Score    If you checked off any problems, how difficult have these problems made it for you to do your work, take care of things at  home, or get along with other people    Depression Interventions/Treatment      There were no vitals filed for this visit.  Medications Reviewed Today     Reviewed by Jorja Nichole LABOR, RN (Case Manager) on 12/01/23 at [phone]  Med List Status: <None>   Medication Order Taking? Sig Documenting Provider Last Dose Status Informant  acetaminophen  (TYLENOL ) 325 MG tablet [id number] Yes Take 650 mg by mouth every 4 (four) hours as needed for mild pain (pain score 1-3). [provider]  Active   albuterol  (VENTOLIN  HFA) 108 (90 Base) MCG/ACT inhaler [id number] Yes INHALE 2 PUFFS INTO THE LUNGS EVERY 6 HOURS AS NEEDED FOR WHEEZING OR SHORTNESS OF BREATH Anders Otto DASEN, MD  Active   alendronate  (FOSAMAX ) 70 MG tablet [id number] Yes TAKE 1 TABLET BY MOUTH EVERY 7 DAYS WITH A FULL GLASS OF WATER AND ON AN EMPTY STOMACH Anders Otto T, MD  Active   amLODipine  (NORVASC ) 10 MG tablet [id number] Yes Take 1 tablet (10 mg total) by mouth daily. Anders Otto DASEN, MD  Active   aspirin  EC 81 MG tablet [id number] Yes Take 1 tablet (81 mg total) by mouth daily. Swallow whole. Anders Otto DASEN, MD  Active   atorvastatin  (LIPITOR) 20 MG tablet [id number] Yes TAKE 1 TABLET(20 MG) BY MOUTH DAILY AT 6 PM Anders Otto DASEN, MD  Active   BREO ELLIPTA  100-25 MCG/ACT AEPB [id number] Yes INHALE 1 PUFF INTO THE LUNGS DAILY Anders, Otto DASEN, MD  Active   calcium -vitamin D  (OSCAL WITH D) 500-200 MG-UNIT tablet [id number] Yes Take 1 tablet by mouth 2 (two) times daily. Anders Otto DASEN, MD  Active   furosemide  (LASIX ) 20 MG tablet [id number] Yes TAKE 1 TABLET(20 MG) BY MOUTH DAILY Branch, Ronal BRAVO, MD  Active  glucose blood (ONETOUCH VERIO) test strip [id number] Yes TO CHECK BLOOD SUGAR ONCE DAILY Anders Otto DASEN, MD  Active   ibuprofen  (ADVIL ) 400 MG tablet [id number]  Take 1 tablet (400 mg total) by mouth every 12 (twelve) hours as needed.  Patient not taking: Reported on 12/01/2023   Anders Otto DASEN, MD  Active    lisinopril  (ZESTRIL ) 20 MG tablet [id number] Yes Take 1 tablet (20 mg total) by mouth daily. Anders Otto DASEN, MD  Active   metFORMIN  (GLUCOPHAGE -XR) 500 MG 24 hr tablet [id number] Yes TAKE 1 TABLET(500 MG) BY MOUTH EVERY OTHER DAY Anders Otto DASEN, MD  Active   omeprazole  (PRILOSEC) 20 MG capsule [id number] Yes TAKE 1 CAPSULE BY MOUTH EVERY DAY AS NEEDED. PROLONGED USE MAY CAUSE RENAL IMPAIRMENT Anders Otto DASEN, MD  Active             Recommendation:   PCP Follow-up Home Health requests: Skilled Nursing Patient and son have requested Home Health RN, in home aid and SW.  Will follow up with provider to obtain orders.   Continue Current Plan of Care  Follow Up Plan:   Telephone follow-up 2 weeks  Dewain Platz, RN, BSN, ACM RN Care Manager [employer] [phone]

## 2023-12-01 NOTE — Addendum Note (Signed)
 Addended by: ANDERS CUMINS T on: 12/01/2023 01:48 PM   Modules accepted: Orders

## 2023-12-01 NOTE — Progress Notes (Signed)
 Complex Care Management Note Care Guide Note  12/01/2023 Name: MONCERRATH BERHE MRN: [id number] DOB: [birth year]  SARAIAH BHAT is a 78 y.o. year old female who is a primary care patient of Anders Otto DASEN, MD . The community resource team was consulted for assistance with Transportation Needs   SDOH screenings and interventions completed:  No        Care guide performed the following interventions: Patient provided with information about care guide support team and interviewed to confirm resource needs.  Follow Up Plan:  Call dropped attempted to call patient again and was unable to leave a voicemail.  Encounter Outcome:  Patient Visit Completed  Cherrish Vitali [facility]  [facility] Guide Direct Dial: [phone]  Fax: [phone] Website: [email or web address]

## 2023-12-05 ENCOUNTER — Telehealth: Payer: Self-pay

## 2023-12-05 NOTE — Progress Notes (Signed)
 Complex Care Management Note Care Guide Note  12/05/2023 Name: Desiree Parker MRN: [id number] DOB: October 30, 1945  [name] is a 78 y.o. year old female who is a primary care patient of [place], Otto DASEN, MD . The community resource team was consulted for assistance with Transportation Needs   SDOH screenings and interventions completed:  Yes  Social Drivers of Health From This Encounter   Food Insecurity: No Food Insecurity (12/05/2023)   Hunger Vital Sign    Worried About Running Out of Food in the Last Year: Never true    Ran Out of Food in the Last Year: Never true  Housing: Low Risk  (12/05/2023)   Housing Stability Vital Sign    Unable to Pay for Housing in the Last Year: No    Number of Times Moved in the Last Year: 0    Homeless in the Last Year: No  Financial Resource Strain: Low Risk  (12/05/2023)   Overall Financial Resource Strain (CARDIA)    Difficulty of Paying Living Expenses: Not very hard  Transportation Needs: No Transportation Needs (12/05/2023)   PRAPARE - Administrator, Civil Service (Medical): No    Lack of Transportation (Non-Medical): No  Utilities: Not At Risk (12/05/2023)   Utilities    Threatened with loss of utilities: No    SDOH Interventions Today    Flowsheet Row Most Recent Value  SDOH Interventions   Transportation Interventions Other (Comment), Payor Benefit  [Mailed information for [employer] Dual Complete transportation benefit. Patient stated she wanted backup transportation if her son is not available to take her to an appointment.]     Care guide performed the following interventions: Patient provided with information about care guide support team and interviewed to confirm resource needs.  Follow Up Plan:  No further follow up planned at this time. The patient has been provided with needed resources.  Encounter Outcome:  Patient Visit Completed  Kiante Ciavarella [facility]  [facility]  Guide Direct Dial: [phone]  Fax: [phone] Website: [email or web address]

## 2023-12-06 ENCOUNTER — Other Ambulatory Visit: Payer: Self-pay

## 2023-12-06 NOTE — Patient Outreach (Signed)
 Complex Care Management   Visit Note  12/06/2023  Name:  Desiree Parker MRN: [id number] DOB: August 05, 1945  Situation: Referral received for Complex Care Management related to Home Health Aid I obtained verbal consent from Caregiver Patient.  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  There is infiltrate consistent with pneumonia in portions of the right lower lobe. There is patchy atelectasis in the left lower lobe. There is no appreciable adenopathy. The prominence in the right hilum noted previously is apparently due to vascular prominence as opposed to mass or adenopathy. 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 BSW conducted a SDOH assessment and determined no barriers were present at this time. During the call, patient's son initially joined but had to disconnect. BSW continued the call with the patient and contacted the patient's insurance provider, [employer], to verify coverage for personal care services. The [employer] agent informed BSW and  patient that personal care services were not fully covered and that some out of pocket costs may apply. The agent provided a list of recommended personal care service agencies located in [place], [place], which included: [facility], [employer]. Following the call, BSW contacted patient's son, Mr. Trembley, to review the discussion with [employer] and share the list of recommended agencies for follow-up. During this conversation, BSW asked Mr. Bruening if he would like BSW to assist by calling the agencies on his behalf. Mr. Brinkman stated that he preferred to follow up with the agencies himself. BSW also offered that if Mr. Scinto was unable to connect with the agencies, she would assist during the scheduled follow-up. A follow up call was scheduled with Mr. Rizzolo for September 4 at 3:00 PM.   Recommendation:   No recommendations at this time.  Follow Up Plan:   Telephone follow-up 12/15/2023 at 3pm  Orlean Fey, BSW [place]  [facility] Social Worker, [facility] [phone]

## 2023-12-06 NOTE — Patient Instructions (Signed)
 Visit Information  Thank you for taking time to visit with me today. Please don't hesitate to contact me if I can be of assistance to you before our next scheduled appointment.  Our next appointment is by telephone on 12/15/2023 at 3pm Please call the care guide team at [phone] if you need to cancel or reschedule your appointment.   Please call the Suicide and Crisis Lifeline: 988 call the [place]  National Suicide Prevention Lifeline: 872 363 4365 or TTY: 548-716-1765 TTY 7181318182) to talk to a trained counselor call 1-800-273-TALK (toll free, 24 hour hotline) go to [facility] [address] [place] [phone]) call 911 if you are experiencing a Mental Health or Behavioral Health Crisis or need someone to talk to.  Patient verbalizes understanding of instructions and care plan provided today and agrees to view in MyChart. Active MyChart status and patient understanding of how to access instructions and care plan via MyChart confirmed with patient.     Orlean Fey, BSW   [facility] Social Worker, [facility] [phone]

## 2023-12-15 ENCOUNTER — Other Ambulatory Visit: Payer: Self-pay | Admitting: *Deleted

## 2023-12-15 ENCOUNTER — Other Ambulatory Visit: Payer: Self-pay

## 2023-12-15 NOTE — Patient Instructions (Signed)
 Visit Information  Thank you for taking time to visit with me today. Please don't hesitate to contact me if I can be of assistance to you before our next scheduled appointment.  Your next care management appointment is by telephone on 01/16/24 at 10 am  Please call the care guide team at [phone] if you need to cancel, schedule, or reschedule an appointment.   Please call the Suicide and Crisis Lifeline: 988 call the [place]  National Suicide Prevention Lifeline: (832)488-3582 or TTY: 801-457-0391 TTY 909-683-5892) to talk to a trained counselor call 1-800-273-TALK (toll free, 24 hour hotline) go to [facility] [address] [place] [phone]) call 911 if you are experiencing a Mental Health or Behavioral Health Crisis or need someone to talk to.  Luther Newhouse, RN, BSN, Theatre manager [employer] [phone]

## 2023-12-15 NOTE — Patient Outreach (Signed)
 Complex Care Management   Visit Note  12/15/2023  Name:  Desiree Parker MRN: [id number] DOB: [birth year]  Situation: Referral received for Complex Care Management related to COPD and Diabetes I obtained verbal consent from Patient Desiree Parker, patient's son.  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  There is infiltrate consistent with pneumonia in portions of the right lower lobe. There is patchy atelectasis in the left lower lobe. There is no appreciable adenopathy. The prominence in the right hilum noted previously is apparently due to vascular prominence as opposed to mass or adenopathy. 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   Noncompliance with medications 09/11/2015   Osteoporosis    TOBACCO USER 03/04/2009   Snuff dipper      Assessment: Patient Reported Symptoms:  Cognitive Cognitive Status: Alert and oriented to person, place, and time, Normal speech and language skills, Insightful and able to interpret abstract concepts Cognitive/Intellectual Conditions Management [RPT]: None reported or documented in medical history or problem list   Health Maintenance Behaviors: Annual physical exam, Sleep adequate, Social activities,  Spiritual practice(s), Stress management, Healthy diet Healing Pattern: Average Health Facilitated by: Prayer/meditation, Stress management, Rest, Healthy diet  Neurological Neurological Review of Symptoms: No symptoms reported Neurological Management Strategies: Routine screening, Adequate rest Neurological Self-Management Outcome: 4 (good)  HEENT HEENT Symptoms Reported: No symptoms reported HEENT Management Strategies: Routine screening HEENT Self-Management Outcome: 4 (good)    Cardiovascular Cardiovascular Symptoms Reported: No symptoms reported Does patient have uncontrolled Hypertension?: No Cardiovascular Management Strategies: Routine screening Cardiovascular Self-Management Outcome: 4 (good) Cardiovascular Comment: Patient reports no blood pressure readings since last visit 2 weeks ago.  Encouraged patient to check blood pressure weekly and log findings.  Respiratory Respiratory Symptoms Reported: Shortness of breath Other Respiratory Symptoms: Reports shortness of breath and wheezing with ambulation and activity.  Patient reports that she uses the Albuterol  Inhaler at on occassion.  Encourgage patient to use Albuterol  inhaler to help with Shortness of breath and wheezing. Respiratory Management Strategies: Adequate rest, Routine screening Respiratory Self-Management Outcome: 3 (uncertain)  Endocrine Endocrine Symptoms Reported: No symptoms reported Is patient diabetic?: Yes Is patient checking blood sugars at home?: Yes (Patient reports that she needs a new glucometer) List most recent blood sugar readings, include date and time of day: Patient reports that she needs a new glucometer.  Patient reports that she has not taken blood sugar since our last call 2 wseeks ago.  Encouraged patient and son to reach out to provider's office to request a prescription for a glucometer.  Discuss the importance of taking blood sugar.  Reviewed signs and symptoms of high and low blood sugar.     Gastrointestinal Gastrointestinal Symptoms Reported: Constipation Additional  Gastrointestinal Details: Patient reports diarrhea has resolved.  She reports occassional constipation.  Discussed increasing water intake, and fruit and vegatable intake.  Patient reports that she will take a laxative that helps resolve constipation as needed. Gastrointestinal Management Strategies: Adequate rest, Medication therapy, Diet modification Gastrointestinal Self-Management Outcome: 3 (uncertain)    Genitourinary Genitourinary Symptoms Reported: No symptoms reported Genitourinary Management Strategies: Adequate rest Genitourinary Self-Management Outcome: 4 (good)  Integumentary Integumentary Symptoms Reported: No symptoms reported Skin Management Strategies: Adequate rest, Routine screening Skin Self-Management Outcome: 4 (good)  Musculoskeletal Musculoskelatal Symptoms Reviewed: Limited mobility Additional Musculoskeletal Details: Patient reports that she uses the walker during the week to assist with ambulation.  She reports that she uses the cane on the weekends.  Denies pain today.  States pain a 0/10 on pain scale. Musculoskeletal Management Strategies: Adequate rest, Medication therapy, Routine screening Musculoskeletal Self-Management Outcome: 4 (good) Falls in the past year?: Yes (Denies any falls withing the past 2 weeks) Number of falls in past year: 1 or less Was there an injury with Fall?: No Fall Risk Category Calculator: 1 Patient Fall Risk Level: Low Fall Risk Patient at Risk for Falls Due to: History of fall(s), Impaired balance/gait, Impaired mobility Fall risk Follow up: Falls evaluation completed, Education provided, Falls prevention discussed  Psychosocial Psychosocial Symptoms Reported: No symptoms reported Behavioral Management Strategies: Support system Behavioral Health Self-Management Outcome: 4 (good) Major Change/Loss/Stressor/Fears (CP): Denies Techniques to Cope with  Loss/Stress/Change: Support group Quality of Family Relationships: helpful, involved, supportive Do you feel physically threatened by others?: No    12/15/2023    PHQ2-9 Depression Screening   Little interest or pleasure in doing things Not at all  Feeling down, depressed, or hopeless Not at all  PHQ-2 - Total Score 0  Trouble falling or staying asleep, or sleeping too much    Feeling tired or having little energy    Poor appetite or overeating     Feeling bad about yourself - or that you are a failure or have let yourself or your family down    Trouble concentrating on things, such as reading the newspaper or watching television    Moving or speaking so slowly that other people could have noticed.  Or the opposite - being so fidgety or restless that you have been moving around a lot more than usual    Thoughts that you would be better off dead, or hurting yourself in some way    PHQ2-9 Total Score    If you checked off any problems, how difficult have these problems made it for you to do your work, take care of things at home, or get along with other people    Depression Interventions/Treatment      There were no vitals filed for this visit.  Medications Reviewed Today     Reviewed by Jorja Nichole LABOR, RN (Case Manager) on 12/15/23 at 1007  Med List Status: <None>   Medication Order Taking? Sig Documenting Provider Last Dose Status Informant  acetaminophen  (TYLENOL ) 325 MG tablet [id number] Yes Take 650 mg by mouth every 4 (four) hours as needed for mild pain (pain score 1-3). [provider]  Active   albuterol  (VENTOLIN  HFA) 108 (90 Base) MCG/ACT inhaler [id number] Yes INHALE 2 PUFFS INTO THE LUNGS EVERY 6 HOURS AS NEEDED FOR WHEEZING OR SHORTNESS OF BREATH Anders Otto DASEN, MD  Active   alendronate  (FOSAMAX ) 70 MG tablet [id number] Yes TAKE 1 TABLET BY MOUTH EVERY 7 DAYS WITH A FULL GLASS  OF WATER AND ON AN EMPTY STOMACH Anders Cumins T, MD  Active   amLODipine  (NORVASC ) 10  MG tablet [id number] Yes Take 1 tablet (10 mg total) by mouth daily. Anders Cumins DASEN, MD  Active   aspirin  EC 81 MG tablet [id number] Yes Take 1 tablet (81 mg total) by mouth daily. Swallow whole. Anders Cumins DASEN, MD  Active   atorvastatin  (LIPITOR) 20 MG tablet [id number] Yes TAKE 1 TABLET(20 MG) BY MOUTH DAILY AT 6 PM Anders Cumins DASEN, MD  Active   BREO ELLIPTA  100-25 MCG/ACT AEPB [id number] Yes INHALE 1 PUFF INTO THE LUNGS DAILY Anders, Cumins DASEN, MD  Active   calcium -vitamin D  (OSCAL WITH D) 500-200 MG-UNIT tablet [id number] Yes Take 1 tablet by mouth 2 (two) times daily. Anders Cumins DASEN, MD  Active   furosemide  (LASIX ) 20 MG tablet [id number] Yes TAKE 1 TABLET(20 MG) BY MOUTH DAILY Branch, Ronal BRAVO, MD  Active   glucose blood [facility] VERIO) test strip [id number] Yes TO CHECK BLOOD SUGAR ONCE DAILY Anders Cumins DASEN, MD  Active   ibuprofen  (ADVIL ) 400 MG tablet [id number]  Take 1 tablet (400 mg total) by mouth every 12 (twelve) hours as needed.  Patient not taking: Reported on 12/15/2023   Anders Cumins DASEN, MD  Active   lisinopril  (ZESTRIL ) 20 MG tablet [id number] Yes Take 1 tablet (20 mg total) by mouth daily. Anders Cumins DASEN, MD  Active   metFORMIN  (GLUCOPHAGE -XR) 500 MG 24 hr tablet [id number] Yes TAKE 1 TABLET(500 MG) BY MOUTH EVERY OTHER DAY Anders Cumins DASEN, MD  Active   omeprazole  (PRILOSEC) 20 MG capsule [id number] Yes TAKE 1 CAPSULE BY MOUTH EVERY DAY AS NEEDED. PROLONGED USE MAY CAUSE RENAL IMPAIRMENT Anders Cumins DASEN, MD  Active             Recommendation:   PCP Follow-up Specialty provider follow-up Podiatry- 02/22/24  Continue Current Plan of Care Keep appointment today with BSW   Follow Up Plan:   Telephone follow-up in 1 month 01/16/24 @ 10am  Bohden Dung, RN, BSN, [employer] RN Care Manager [employer] [phone]

## 2023-12-19 ENCOUNTER — Other Ambulatory Visit: Payer: Self-pay | Admitting: Family Medicine

## 2023-12-19 DIAGNOSIS — M810 Age-related osteoporosis without current pathological fracture: Secondary | ICD-10-CM

## 2023-12-20 ENCOUNTER — Telehealth: Payer: Self-pay

## 2023-12-20 ENCOUNTER — Other Ambulatory Visit: Payer: Self-pay | Admitting: Family Medicine

## 2023-12-20 MED ORDER — ONETOUCH VERIO FLEX SYSTEM W/DEVICE KIT: PACK | 0 refills | Status: DC

## 2023-12-20 NOTE — Telephone Encounter (Signed)
 Patient calls nurse line requesting a refill on DM testing supplies.   She reports she recently moved and lost her meter and strips.   She reports testing once per day and using One Touch.   Advised will forward to PCP to send in.

## 2023-12-20 NOTE — Telephone Encounter (Signed)
Prescription e scribed 

## 2023-12-23 ENCOUNTER — Other Ambulatory Visit: Payer: Self-pay | Admitting: Family Medicine

## 2023-12-23 DIAGNOSIS — K219 Gastro-esophageal reflux disease without esophagitis: Secondary | ICD-10-CM

## 2023-12-28 DIAGNOSIS — Z961 Presence of intraocular lens: Secondary | ICD-10-CM | POA: Diagnosis not present

## 2023-12-28 DIAGNOSIS — H401134 Primary open-angle glaucoma, bilateral, indeterminate stage: Secondary | ICD-10-CM | POA: Diagnosis not present

## 2024-01-03 ENCOUNTER — Other Ambulatory Visit: Payer: Self-pay | Admitting: Family Medicine

## 2024-01-03 ENCOUNTER — Other Ambulatory Visit (HOSPITAL_COMMUNITY): Payer: Self-pay

## 2024-01-03 ENCOUNTER — Telehealth: Payer: Self-pay

## 2024-01-03 MED ORDER — GLUCOSE BLOOD VI STRP: ORAL_STRIP | 6 refills | Status: AC

## 2024-01-03 MED ORDER — CONTOUR PLUS BLUE W/DEVICE KIT: 1.0000 | PACK | Freq: Every day | 0 refills | Status: AC

## 2024-01-03 NOTE — Telephone Encounter (Signed)
 Rec'd PA request for patients OneTouch Verio Medtronic w/Device [employer] prefers: Contour Plus [place], or AccuChek Guide

## 2024-01-06 ENCOUNTER — Other Ambulatory Visit: Payer: Self-pay

## 2024-01-06 NOTE — Patient Instructions (Signed)
 Angelee D Markey - I am sorry I was unable to reach you today for our scheduled appointment. I work with Anders Otto DASEN, MD and am calling to support your healthcare needs. Please contact me at [phone] at your earliest convenience. I look forward to speaking with you soon.   Thank you,  Orlean Fey, BSW [place]  [facility] Social Worker, [employer] [phone]

## 2024-01-10 ENCOUNTER — Other Ambulatory Visit: Payer: Self-pay

## 2024-01-10 NOTE — Patient Instructions (Signed)
 Desiree Parker - I have attempted to call you three times but have been unsuccessful in reaching you. I work with Anders Otto DASEN, MD and am calling to support your healthcare needs. If I can be of assistance to you, please contact me at [phone].     Thank you,  Orlean Fey, BSW [place]  [facility] Social Worker, [employer] [phone]

## 2024-01-16 ENCOUNTER — Other Ambulatory Visit: Payer: Self-pay | Admitting: *Deleted

## 2024-01-16 ENCOUNTER — Other Ambulatory Visit: Payer: Self-pay

## 2024-01-16 NOTE — Patient Outreach (Signed)
 Complex Care Management   Visit Note  01/16/2024  Name:  Desiree Parker MRN: [id number] DOB: [birth year]  Situation: Referral received for Complex Care Management related to COPD and Diabetes I obtained verbal consent from Patient.  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  There is infiltrate consistent with pneumonia in portions of the right lower lobe. There is patchy atelectasis in the left lower lobe. There is no appreciable adenopathy. The prominence in the right hilum noted previously is apparently due to vascular prominence as opposed to mass or adenopathy. 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   Noncompliance with medications 09/11/2015   Osteoporosis    TOBACCO USER 03/04/2009   Snuff dipper      Assessment: Patient Reported Symptoms:  Cognitive Cognitive Status: No symptoms reported, Able to follow simple commands, Alert and oriented to person, place, and time, Insightful and able to interpret abstract concepts, Normal speech and language skills Cognitive/Intellectual Conditions Management [RPT]: None reported or documented in medical history or problem list   Health Maintenance Behaviors: Annual physical exam, Sleep adequate, Healthy diet, Stress  management, Spiritual practice(s) Healing Pattern: Average Health Facilitated by: Prayer/meditation, Stress management, Rest, Healthy diet  Neurological Neurological Review of Symptoms: No symptoms reported Neurological Management Strategies: Routine screening, Adequate rest Neurological Self-Management Outcome: 4 (good)  HEENT HEENT Symptoms Reported: No symptoms reported HEENT Management Strategies: Routine screening, Adequate rest HEENT Self-Management Outcome: 4 (good)    Cardiovascular Cardiovascular Symptoms Reported: No symptoms reported, Swelling in legs or feet Does patient have uncontrolled Hypertension?: No Cardiovascular Management Strategies: Routine screening, Adequate rest Cardiovascular Self-Management Outcome: 4 (good)  Respiratory Respiratory Symptoms Reported: Wheezing, Shortness of breath Other Respiratory Symptoms: Reports that she has occassional shortness of breath and wheezing.  Patient reports that she uses her Albuterol  inhaler as needed. Respiratory Management Strategies: Adequate rest, Routine screening Respiratory Self-Management Outcome: 3 (uncertain)  Endocrine Endocrine Symptoms Reported: No symptoms reported Is patient diabetic?: Yes Is patient checking blood sugars at home?: No List most recent blood sugar readings, include date and time of day: Patient reports that she just received her blood glucose meter.  She informed me that she was instructed to take blood sugar one per day.  Reinforce to take blood sugars once a day a direct by provider. Endocrine Self-Management Outcome: 3 (uncertain)  Gastrointestinal Gastrointestinal Symptoms Reported: No symptoms reported Gastrointestinal Management Strategies: Adequate rest, Activity, Medication therapy Gastrointestinal Self-Management Outcome: 4 (good)    Genitourinary Genitourinary Symptoms Reported: Pain/burning with urination Additional Genitourinary Details: Reports occassional buring with urination.   Encourage proper water intake and to make provider aware if buring continues. Genitourinary Management Strategies: Adequate rest Genitourinary Self-Management Outcome: 3 (uncertain)  Integumentary   Skin Management Strategies: Adequate  rest, Routine screening Skin Self-Management Outcome: 4 (good)  Musculoskeletal Musculoskelatal Symptoms Reviewed: Limited mobility Additional Musculoskeletal Details: Patient reports that she uses a walker to get around with.  She informs me that she uses a cane on Sunday.  Denies any pain today. Musculoskeletal Management Strategies: Adequate rest, Activity, Medical device Musculoskeletal Self-Management Outcome: 4 (good) Falls in the past year?: Yes (Denies any falls in the past month.) Number of falls in past year: 1 or less Was there an injury with Fall?: No Fall Risk Category Calculator: 1 Patient Fall Risk Level: Low Fall Risk Patient at Risk for Falls Due to: History of fall(s), Impaired mobility, Impaired balance/gait Fall risk Follow up: Falls evaluation completed, Education provided, Falls prevention discussed  Psychosocial Psychosocial Symptoms Reported: No symptoms reported Behavioral Management Strategies: Support system, Activity Behavioral Health Self-Management Outcome: 4 (good) Major Change/Loss/Stressor/Fears (CP): Denies Techniques to Cope with Loss/Stress/Change: Spiritual practice(s) Quality of Family Relationships: helpful, involved, supportive Do you feel physically threatened by others?: No    01/16/2024    PHQ2-9 Depression Screening   Little interest or pleasure in doing things Not at all  Feeling down, depressed, or hopeless Not at all  PHQ-2 - Total Score 0  Trouble falling or staying asleep, or sleeping too much    Feeling tired or having little energy    Poor appetite or overeating     Feeling bad about yourself - or that you are a failure or have let yourself or your family down    Trouble concentrating on things, such  as reading the newspaper or watching television    Moving or speaking so slowly that other people could have noticed.  Or the opposite - being so fidgety or restless that you have been moving around a lot more than usual    Thoughts that you would be better off dead, or hurting yourself in some way    PHQ2-9 Total Score    If you checked off any problems, how difficult have these problems made it for you to do your work, take care of things at home, or get along with other people    Depression Interventions/Treatment      There were no vitals filed for this visit.  Medications Reviewed Today     Reviewed by Jorja Nichole LABOR, RN (Case Manager) on 01/16/24 at 1043  Med List Status: <None>   Medication Order Taking? Sig Documenting Provider Last Dose Status Informant  acetaminophen  (TYLENOL ) 325 MG tablet [id number] Yes Take 650 mg by mouth every 4 (four) hours as needed for mild pain (pain score 1-3). [provider]  Active   albuterol  (VENTOLIN  HFA) 108 (90 Base) MCG/ACT inhaler [id number] Yes INHALE 2 PUFFS INTO THE LUNGS EVERY 6 HOURS AS NEEDED FOR WHEEZING OR SHORTNESS OF BREATH Anders Otto DASEN, MD  Active   alendronate  (FOSAMAX ) 70 MG tablet [id number] Yes TAKE 1 TABLET BY MOUTH EVERY 7 DAYS WITH A FULL GLASS OF WATER AND ON AN EMPTY STOMACH Anders Otto T, MD  Active   amLODipine  (NORVASC ) 10 MG tablet [id number] Yes Take 1 tablet (10 mg total) by mouth daily. Anders Otto DASEN, MD  Active   aspirin  EC 81 MG tablet [id number] Yes Take 1 tablet (81 mg total) by mouth daily. Swallow whole. Anders Otto DASEN, MD  Active   atorvastatin  (LIPITOR) 20 MG tablet [id number] Yes TAKE 1 TABLET(20 MG) BY MOUTH DAILY AT 6 PM [place], Otto DASEN, MD  Active   Blood Glucose Monitoring Suppl (  CONTOUR PLUS BLUE) w/Device KIT [id number] Yes 1 kit by Does not apply route daily. Use daily to check CBG Anders Otto DASEN, MD  Active   BREO ELLIPTA  100-25 MCG/ACT AEPB [id number] Yes INHALE 1 PUFF INTO THE  LUNGS DAILY Anders, Otto DASEN, MD  Active   calcium -vitamin D  (OSCAL WITH D) 500-200 MG-UNIT tablet [id number] Yes Take 1 tablet by mouth 2 (two) times daily. Anders Otto DASEN, MD  Active   furosemide  (LASIX ) 20 MG tablet [id number] Yes TAKE 1 TABLET(20 MG) BY MOUTH DAILY Branch, Ronal BRAVO, MD  Active   glucose blood test strip [id number] Yes Use to check CBG daily Anders Otto DASEN, MD  Active   ibuprofen  (ADVIL ) 400 MG tablet [id number]  Take 1 tablet (400 mg total) by mouth every 12 (twelve) hours as needed.  Patient not taking: Reported on 01/16/2024   Anders Otto DASEN, MD  Active   lisinopril  (ZESTRIL ) 20 MG tablet [id number] Yes Take 1 tablet (20 mg total) by mouth daily. Anders Otto DASEN, MD  Active   metFORMIN  (GLUCOPHAGE -XR) 500 MG 24 hr tablet [id number] Yes TAKE 1 TABLET(500 MG) BY MOUTH EVERY OTHER DAY Anders Otto DASEN, MD  Active   omeprazole  (PRILOSEC) 20 MG capsule [id number] Yes TAKE 1 CAPSULE BY MOUTH EVERY DAY AS NEEDED. PROLONGED USE MAY CAUSE RENAL IMPAIRMENT Anders Otto DASEN, MD  Active             Recommendation:   PCP Follow-up Specialty provider follow-up :Podiatry- 02/22/24 Continue Current Plan of Care  Follow Up Plan:   Telephone follow-up in 1 month: 02/20/24 @ 10 am  Kaitlin Ardito, RN, BSN, [employer] RN Care Manager [employer] [phone]

## 2024-01-16 NOTE — Patient Instructions (Signed)
 Visit Information  Thank you for taking time to visit with me today. Please don't hesitate to contact me if I can be of assistance to you before our next scheduled appointment.  Your next care management appointment is by telephone on 02/20/24 at 10 am.  Please call the care guide team at [phone] if you need to cancel, schedule, or reschedule an appointment.   Please call the Suicide and Crisis Lifeline: 988 call the [place]  National Suicide Prevention Lifeline: (843) 457-6156 or TTY: 315-589-4042 TTY (909)803-2674) to talk to a trained counselor call 1-800-273-TALK (toll free, 24 hour hotline) go to [facility] [address] [place] [phone]) call 911 if you are experiencing a Mental Health or Behavioral Health Crisis or need someone to talk to.  Brailyn Delman, RN, BSN, Theatre manager [employer] [phone]

## 2024-01-25 DIAGNOSIS — H5010 Unspecified exotropia: Secondary | ICD-10-CM | POA: Diagnosis not present

## 2024-01-25 DIAGNOSIS — H401134 Primary open-angle glaucoma, bilateral, indeterminate stage: Secondary | ICD-10-CM | POA: Diagnosis not present

## 2024-01-25 DIAGNOSIS — Z961 Presence of intraocular lens: Secondary | ICD-10-CM | POA: Diagnosis not present

## 2024-02-20 ENCOUNTER — Other Ambulatory Visit: Payer: Self-pay | Admitting: *Deleted

## 2024-02-20 NOTE — Patient Instructions (Signed)
 Lakshmi D Bures - I am sorry I was unable to reach you today for our scheduled appointment. I work with Anders Otto DASEN, MD and am calling to support your healthcare needs.  I have rescheduled visit for 03/27/24 @  11 am.   Please contact me at [phone] should you need to reschedule.  I look forward to speaking with you soon.   Thank you,  Tashena Ibach, RN, BSN, ACM RN Care Manager [employer] [phone]

## 2024-02-22 ENCOUNTER — Ambulatory Visit: Admitting: Podiatry

## 2024-02-22 DIAGNOSIS — B351 Tinea unguium: Secondary | ICD-10-CM

## 2024-02-22 DIAGNOSIS — M79674 Pain in right toe(s): Secondary | ICD-10-CM

## 2024-02-22 DIAGNOSIS — M79675 Pain in left toe(s): Secondary | ICD-10-CM

## 2024-02-22 DIAGNOSIS — E1151 Type 2 diabetes mellitus with diabetic peripheral angiopathy without gangrene: Secondary | ICD-10-CM

## 2024-03-02 ENCOUNTER — Encounter: Payer: Self-pay | Admitting: Podiatry

## 2024-03-02 NOTE — Progress Notes (Signed)
  Subjective:  Patient ID: Desiree Parker, female    DOB: September 17, 1945,  MRN: [id number]  Desiree Parker presents to clinic today for at risk foot care. Pt has h/o NIDDM with PAD and painful elongated mycotic toenails 1-5 bilaterally which are tender when wearing enclosed shoe gear. Pain is relieved with periodic professional debridement.  Chief Complaint  Patient presents with   Diabetes    DFC NIDDM A1C 7.1. Toenail trim. LOV with PCP 09/27/23.   New problem(s): None.   PCP is Anders Otto DASEN, MD.  No Known Allergies  Review of Systems: Negative except as noted in the HPI.  Objective: No changes noted in today's physical examination. There were no vitals filed for this visit. Desiree Parker is a pleasant 78 y.o. female in NAD. AAO x 3.  Vascular Examination: CFT <3 seconds b/l. DP pulses faintly palpable b/l. PT pulses nonpalpable b/l. Digital hair absent. Skin temperature gradient warm to warm b/l. No pain with calf compression. No ischemia or gangrene. No cyanosis or clubbing noted b/l.    Neurological Examination: Sensation grossly intact b/l with 10 gram monofilament.  Dermatological Examination: Pedal skin warm and supple b/l. No open wounds b/l. No interdigital macerations. Toenails 1-5 b/l thick, discolored, elongated with subungual debris and pain on dorsal palpation.  No hyperkeratotic nor porokeratotic lesions present on today's visit.  Musculoskeletal Examination: Muscle strength 5/5 to all lower extremity muscle groups bilaterally. HAV with bunion deformity noted b/l LE. Utilizes cane for ambulation assistance.  Radiographs: None  Assessment/Plan: 1. Pain due to onychomycosis of toenails of both feet   2. Type II diabetes mellitus with peripheral circulatory disorder [facility])   Patient was evaluated and treated. All patient's and/or POA's questions/concerns addressed on today's visit. Mycotic toenails 1-5 b/l debrided in length and girth without incident.  Treatment was  provided by assistant Andrez Manchester under my supervision. Continue daily foot inspections and monitor blood glucose per PCP/Endocrinologist's recommendations.Continue soft, supportive shoe gear daily. Report any pedal injuries to medical professional. Call office if there are any quesitons/concerns.  Return in about 3 months (around 05/24/2024).  Delon LITTIE Merlin, DPM       LOCATION: [address] [place] [address]                   Office [phone]   [facility] LOCATION: [address] [place], [place] [address] Office [phone]

## 2024-03-22 ENCOUNTER — Other Ambulatory Visit: Payer: Self-pay | Admitting: Family Medicine

## 2024-03-27 ENCOUNTER — Other Ambulatory Visit: Payer: Self-pay

## 2024-03-27 ENCOUNTER — Other Ambulatory Visit: Payer: Self-pay | Admitting: *Deleted

## 2024-03-27 NOTE — Patient Instructions (Signed)
 Visit Information  Thank you for taking time to visit with me today. Please don't hesitate to contact me if I can be of assistance to you before our next scheduled appointment.  Your next care management appointment is by telephone on 05/10/24 at 11 am.  Telephone follow-up in 1 month: 05/10/24 @ 11 am.    Please call the care guide team at [phone] if you need to cancel, schedule, or reschedule an appointment.   Please call the Suicide and Crisis Lifeline: 988 call the [place]  National Suicide Prevention Lifeline: 714-726-6732 or TTY: 567 404 5084 TTY 312-675-2834) to talk to a trained counselor call 1-800-273-TALK (toll free, 24 hour hotline) go to [facility] [address], [place] [phone]) call the [facility] Crisis Line: [phone] call 911 if you are experiencing a Mental Health or Behavioral Health Crisis or need someone to talk to.  Jomarie Gellis, RN, BSN, Theatre Manager [employer] [phone]

## 2024-03-27 NOTE — Patient Outreach (Signed)
 Complex Care Management   Visit Note  03/27/2024  Name:  Desiree Parker MRN: [id number] DOB: 1945/07/01  Situation: Referral received for Complex Care Management related to COPD and Diabetes I obtained verbal consent from Patient.  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  There is infiltrate consistent with pneumonia in portions of the right lower lobe. There is patchy atelectasis in the left lower lobe. There is no appreciable adenopathy. The prominence in the right hilum noted previously is apparently due to vascular prominence as opposed to mass or adenopathy. 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   Noncompliance with medications 09/11/2015   Osteoporosis    TOBACCO USER 03/04/2009   Snuff dipper      Assessment: Patient Reported Symptoms:  Cognitive Cognitive Status: Able to follow simple commands, Alert and oriented to person, place, and time, Insightful and able to interpret abstract concepts, Normal speech and language skills Cognitive/Intellectual Conditions Management [RPT]: None reported or documented in medical history or problem list   Health Maintenance Behaviors: Annual physical exam, Sleep adequate, Healthy diet, Stress management, Spiritual  practice(s) Healing Pattern: Average Health Facilitated by: Prayer/meditation, Stress management, Rest, Healthy diet  Neurological Neurological Review of Symptoms: No symptoms reported Neurological Management Strategies: Adequate rest, Routine screening Neurological Self-Management Outcome: 4 (good)  HEENT HEENT Symptoms Reported: No symptoms reported HEENT Management Strategies: Adequate rest, Routine screening HEENT Self-Management Outcome: 4 (good)    Cardiovascular Cardiovascular Symptoms Reported: No symptoms reported, Swelling in legs or feet Does patient have uncontrolled Hypertension?: No Cardiovascular Management Strategies: Routine screening, Adequate rest Cardiovascular Self-Management Outcome: 4 (good)  Respiratory Respiratory Symptoms Reported: No symptoms reported Respiratory Management Strategies: Adequate rest, Routine screening Respiratory Self-Management Outcome: 4 (good)  Endocrine Endocrine Symptoms Reported: No symptoms reported Is patient diabetic?: Yes Is patient checking blood sugars at home?:  (Patient reports she checks her blood sugar whens he thinks about it) List most recent blood sugar readings, include date and time of day: Patient reports that she has a blood glucose meter.  I have encouraged patient to take blood sugar once daily. Endocrine Self-Management Outcome: 3 (uncertain) Endocrine Comment: Patient reports no readings today.  Gastrointestinal Gastrointestinal Symptoms Reported: No symptoms reported Gastrointestinal Management Strategies: Adequate rest, Activity, Medication therapy Gastrointestinal Self-Management Outcome: 4 (good)    Genitourinary Genitourinary Symptoms Reported: No symptoms reported Genitourinary Management Strategies: Adequate rest Genitourinary Self-Management Outcome: 4 (good)  Integumentary Integumentary Symptoms Reported: No symptoms reported Skin Management Strategies: Adequate rest, Routine screening Skin Self-Management  Outcome: 4 (good)  Musculoskeletal Musculoskelatal Symptoms Reviewed: Limited mobility Additional Musculoskeletal Details: Patient reports that she uses a walker to get around with.  She informs me that she also uses a cane to get around with if  needed. Musculoskeletal Management Strategies: Adequate rest, Activity, Medical device Musculoskeletal Self-Management Outcome: 3 (uncertain) Falls in the past year?: Yes (Denies any falls since last out reach.) Number of falls in past year: 1 or less Was there an injury with Fall?: No Fall Risk Category Calculator: 1 Patient Fall Risk Level: Low Fall Risk Patient at Risk for Falls Due to: History of fall(s), Impaired mobility, Impaired balance/gait Fall risk Follow up: Falls evaluation completed, Education provided, Falls prevention discussed  Psychosocial Psychosocial Symptoms Reported: No symptoms reported Behavioral Management Strategies: Support system, Activity Behavioral Health Self-Management Outcome: 4 (good) Major Change/Loss/Stressor/Fears (CP): Denies Techniques to Cope with Loss/Stress/Change: Spiritual practice(s) Quality of Family Relationships: helpful, involved, supportive Do you feel physically threatened by others?: No    03/27/2024    PHQ2-9 Depression Screening   Little interest or pleasure in doing things Not at all  Feeling down, depressed, or hopeless Not at all  PHQ-2 - Total Score 0  Trouble falling or staying asleep, or sleeping too much    Feeling tired or having little energy    Poor appetite or overeating     Feeling bad about yourself - or that you are a failure or have let yourself or your family down    Trouble concentrating on things, such as reading the newspaper or watching television    Moving or speaking so slowly that other people could have noticed.  Or the opposite - being so fidgety or restless that you have been moving around a lot more than usual    Thoughts that you would be better off dead, or  hurting yourself in some way    PHQ2-9 Total Score    If you checked off any problems, how difficult have these problems made it for you to do your work, take care of things at home, or get along with other people    Depression Interventions/Treatment      There were no vitals filed for this visit. Pain Scale: 0-10 Pain Score: 0-No pain  Medications Reviewed Today     Reviewed by Rhealynn Myhre A, RN (Case Manager) on 03/27/24 at 1056  Med List Status: <None>   Medication Order Taking? Sig Documenting Provider Last Dose Status Informant  acetaminophen  (TYLENOL ) 325 MG tablet [id number] Yes Take 650 mg by mouth every 4 (four) hours as needed for mild pain (pain score 1-3). [provider]  Active   albuterol  (VENTOLIN  HFA) 108 (90 Base) MCG/ACT inhaler [id number] Yes INHALE 2 PUFFS INTO THE LUNGS EVERY 6 HOURS AS NEEDED FOR WHEEZING OR SHORTNESS OF BREATH Anders Otto DASEN, MD  Active   alendronate  (FOSAMAX ) 70 MG tablet [id number] Yes TAKE 1 TABLET BY MOUTH EVERY 7 DAYS WITH A FULL GLASS OF WATER AND ON AN EMPTY STOMACH Anders Otto T, MD  Active   amLODipine  (NORVASC ) 10 MG tablet [id number] Yes Take 1 tablet (10 mg total) by mouth daily. Anders Otto DASEN, MD  Active   aspirin  EC 81 MG tablet [id number] Yes Take 1 tablet (81 mg total) by mouth daily. Swallow whole. Anders Otto DASEN, MD  Active   atorvastatin  (LIPITOR) 20 MG tablet [id number] Yes TAKE 1 TABLET(20 MG) BY MOUTH DAILY AT 6 PM Eniola, Otto DASEN, MD  Active   Blood Glucose Monitoring Suppl (CONTOUR PLUS BLUE) w/Device KIT [id number] Yes 1 kit by Does not apply route daily. Use daily to check CBG Anders Otto DASEN, MD  Active   BREO ELLIPTA  100-25 MCG/ACT AEPB [id number] Yes INHALE 1  PUFF INTO THE LUNGS DAILY Anders Otto DASEN, MD  Active   calcium -vitamin D  (OSCAL WITH D) 500-200 MG-UNIT tablet [id number] Yes Take 1 tablet by mouth 2 (two) times daily. Anders Otto DASEN, MD  Active   furosemide  (LASIX ) 20 MG tablet [id number]  Yes TAKE 1 TABLET(20 MG) BY MOUTH DAILY Branch, Ronal BRAVO, MD  Active   glucose blood test strip [id number] Yes Use to check CBG daily Anders Otto T, MD  Active   ibuprofen  (ADVIL ) 400 MG tablet [id number]  Take 1 tablet (400 mg total) by mouth every 12 (twelve) hours as needed.  Patient not taking: Reported on 03/27/2024   Anders Otto DASEN, MD  Active   lisinopril  (ZESTRIL ) 20 MG tablet [id number] Yes Take 1 tablet (20 mg total) by mouth daily. Anders Otto DASEN, MD  Active   metFORMIN  (GLUCOPHAGE -XR) 500 MG 24 hr tablet [id number] Yes TAKE 1 TABLET(500 MG) BY MOUTH EVERY OTHER DAY Anders Otto DASEN, MD  Active   omeprazole  (PRILOSEC) 20 MG capsule [id number] Yes TAKE 1 CAPSULE BY MOUTH EVERY DAY AS NEEDED. PROLONGED USE MAY CAUSE RENAL IMPAIRMENT Anders Otto DASEN, MD  Active             Recommendation:   PCP Follow-up Specialty provider follow-up :Podiatry-11 am. Continue Current Plan of Care  Follow Up Plan:   Telephone follow-up in 1 month: 05/10/24 @ 11 am.    Khia Dieterich, RN, BSN, ACM RN Care Manager [employer] [phone]

## 2024-03-27 NOTE — Patient Outreach (Signed)
 Complex Care Management   Visit Note  03/27/2024  Name:  QUANETTA TRUSS MRN: [id number] DOB: Apr 14, 1945  Situation: Referral received for Complex Care Management related to COPD and Diabetes I obtained verbal consent from Patient.  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  There is infiltrate consistent with pneumonia in portions of the right lower lobe. There is patchy atelectasis in the left lower lobe. There is no appreciable adenopathy. The prominence in the right hilum noted previously is apparently due to vascular prominence as opposed to mass or adenopathy. 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   Noncompliance with medications 09/11/2015   Osteoporosis    TOBACCO USER 03/04/2009   Snuff dipper      Assessment: Patient Reported Symptoms:  Cognitive Cognitive Status: Able to follow simple commands, Alert and oriented to person, place, and time, Insightful and able to interpret abstract concepts, Normal speech and language skills Cognitive/Intellectual Conditions Management [RPT]: None reported or documented in medical history or problem list   Health Maintenance Behaviors: Annual physical exam, Sleep adequate, Healthy diet, Stress management, Spiritual  practice(s) Healing Pattern: Average Health Facilitated by: Prayer/meditation, Stress management, Rest, Healthy diet  Neurological Neurological Review of Symptoms: No symptoms reported Neurological Management Strategies: Adequate rest, Routine screening Neurological Self-Management Outcome: 4 (good)  HEENT HEENT Symptoms Reported: No symptoms reported HEENT Management Strategies: Adequate rest, Routine screening HEENT Self-Management Outcome: 4 (good)    Cardiovascular Cardiovascular Symptoms Reported: No symptoms reported, Swelling in legs or feet Does patient have uncontrolled Hypertension?: No Cardiovascular Management Strategies: Routine screening, Adequate rest Cardiovascular Self-Management Outcome: 4 (good)  Respiratory Respiratory Symptoms Reported: No symptoms reported Respiratory Management Strategies: Adequate rest, Routine screening Respiratory Self-Management Outcome: 4 (good)  Endocrine Endocrine Symptoms Reported: No symptoms reported Is patient diabetic?: Yes Is patient checking blood sugars at home?:  (Patient reports she checks her blood sugar whens he thinks about it) List most recent blood sugar readings, include date and time of day: Patient reports that she has a blood glucose meter.  I have encouraged patient to take blood sugar once daily. Endocrine Self-Management Outcome: 3 (uncertain) Endocrine Comment: Patient reports no readings today.  Gastrointestinal Gastrointestinal Symptoms Reported: No symptoms reported Gastrointestinal Management Strategies: Adequate rest, Activity, Medication therapy Gastrointestinal Self-Management Outcome: 4 (good)    Genitourinary Genitourinary Symptoms Reported: No symptoms reported Genitourinary Management Strategies: Adequate rest Genitourinary Self-Management Outcome: 4 (good)  Integumentary Integumentary Symptoms Reported: No symptoms reported Skin Management Strategies: Adequate rest, Routine screening Skin Self-Management  Outcome: 4 (good)  Musculoskeletal Musculoskelatal Symptoms Reviewed: Limited mobility Additional Musculoskeletal Details: Patient reports that she uses a walker to get around with.  She informs me that she also uses a cane to get around with if  needed. Musculoskeletal Management Strategies: Adequate rest, Activity, Medical device Musculoskeletal Self-Management Outcome: 3 (uncertain) Falls in the past year?: Yes (Denies any falls since last out reach.) Number of falls in past year: 1 or less Was there an injury with Fall?: No Fall Risk Category Calculator: 1 Patient Fall Risk Level: Low Fall Risk Patient at Risk for Falls Due to: History of fall(s), Impaired mobility, Impaired balance/gait Fall risk Follow up: Falls evaluation completed, Education provided, Falls prevention discussed  Psychosocial Psychosocial Symptoms Reported: No symptoms reported Behavioral Management Strategies: Support system, Activity Behavioral Health Self-Management Outcome: 4 (good) Major Change/Loss/Stressor/Fears (CP): Denies Techniques to Cope with Loss/Stress/Change: Spiritual practice(s) Quality of Family Relationships: helpful, involved, supportive Do you feel physically threatened by others?: No    03/27/2024    PHQ2-9 Depression Screening   Little interest or pleasure in doing things Not at all  Feeling down, depressed, or hopeless Not at all  PHQ-2 - Total Score 0  Trouble falling or staying asleep, or sleeping too much    Feeling tired or having little energy    Poor appetite or overeating     Feeling bad about yourself - or that you are a failure or have let yourself or your family down    Trouble concentrating on things, such as reading the newspaper or watching television    Moving or speaking so slowly that other people could have noticed.  Or the opposite - being so fidgety or restless that you have been moving around a lot more than usual    Thoughts that you would be better off dead, or  hurting yourself in some way    PHQ2-9 Total Score    If you checked off any problems, how difficult have these problems made it for you to do your work, take care of things at home, or get along with other people    Depression Interventions/Treatment      There were no vitals filed for this visit. Pain Scale: 0-10 Pain Score: 0-No pain  Medications Reviewed Today     Reviewed by Vash Quezada A, RN (Case Manager) on 03/27/24 at 1056  Med List Status: <None>   Medication Order Taking? Sig Documenting Provider Last Dose Status Informant  acetaminophen  (TYLENOL ) 325 MG tablet [id number] Yes Take 650 mg by mouth every 4 (four) hours as needed for mild pain (pain score 1-3). [provider]  Active   albuterol  (VENTOLIN  HFA) 108 (90 Base) MCG/ACT inhaler [id number] Yes INHALE 2 PUFFS INTO THE LUNGS EVERY 6 HOURS AS NEEDED FOR WHEEZING OR SHORTNESS OF BREATH Anders Otto DASEN, MD  Active   alendronate  (FOSAMAX ) 70 MG tablet [id number] Yes TAKE 1 TABLET BY MOUTH EVERY 7 DAYS WITH A FULL GLASS OF WATER AND ON AN EMPTY STOMACH Anders Otto T, MD  Active   amLODipine  (NORVASC ) 10 MG tablet [id number] Yes Take 1 tablet (10 mg total) by mouth daily. Anders Otto DASEN, MD  Active   aspirin  EC 81 MG tablet [id number] Yes Take 1 tablet (81 mg total) by mouth daily. Swallow whole. Anders Otto DASEN, MD  Active   atorvastatin  (LIPITOR) 20 MG tablet [id number] Yes TAKE 1 TABLET(20 MG) BY MOUTH DAILY AT 6 PM Eniola, Otto DASEN, MD  Active   Blood Glucose Monitoring Suppl (CONTOUR PLUS BLUE) w/Device KIT [id number] Yes 1 kit by Does not apply route daily. Use daily to check CBG Anders Otto DASEN, MD  Active   BREO ELLIPTA  100-25 MCG/ACT AEPB [id number] Yes INHALE 1  PUFF INTO THE LUNGS DAILY Anders Otto DASEN, MD  Active   calcium -vitamin D  (OSCAL WITH D) 500-200 MG-UNIT tablet [id number] Yes Take 1 tablet by mouth 2 (two) times daily. Anders Otto DASEN, MD  Active   furosemide  (LASIX ) 20 MG tablet [id number]  Yes TAKE 1 TABLET(20 MG) BY MOUTH DAILY Branch, Ronal BRAVO, MD  Active   glucose blood test strip [id number] Yes Use to check CBG daily Anders Otto DASEN, MD  Active   ibuprofen  (ADVIL ) 400 MG tablet [id number]  Take 1 tablet (400 mg total) by mouth every 12 (twelve) hours as needed.  Patient not taking: Reported on 03/27/2024   Anders Otto DASEN, MD  Active   lisinopril  (ZESTRIL ) 20 MG tablet [id number] Yes Take 1 tablet (20 mg total) by mouth daily. Anders Otto DASEN, MD  Active   metFORMIN  (GLUCOPHAGE -XR) 500 MG 24 hr tablet [id number] Yes TAKE 1 TABLET(500 MG) BY MOUTH EVERY OTHER DAY Anders Otto DASEN, MD  Active   omeprazole  (PRILOSEC) 20 MG capsule [id number] Yes TAKE 1 CAPSULE BY MOUTH EVERY DAY AS NEEDED. PROLONGED USE MAY CAUSE RENAL IMPAIRMENT Anders Otto DASEN, MD  Active             Recommendation:   PCP Follow-up Specialty provider follow-up : Podiatry-06/12/24 Continue Current Plan of Care  Follow Up Plan:   Telephone follow-up in 1 month: 05/10/24 @ 11 am  Breahna Boylen, RN, SCIENTIST, RESEARCH (PHYSICAL SCIENCES), Theatre Manager [employer] [phone]

## 2024-05-03 ENCOUNTER — Telehealth: Payer: Self-pay

## 2024-05-03 ENCOUNTER — Ambulatory Visit

## 2024-05-03 VITALS — Ht 59.0 in | Wt 200.0 lb

## 2024-05-03 DIAGNOSIS — Z0000 Encounter for general adult medical examination without abnormal findings: Secondary | ICD-10-CM

## 2024-05-03 NOTE — Progress Notes (Signed)
 "  Chief Complaint  Patient presents with   Medicare Wellness    SUBSEQUENT     Subjective:   MARCELLINE TEMKIN is a 79 y.o. female who presents for a Medicare Annual Wellness Visit.  Visit info / Clinical Intake: Medicare Wellness Visit Type:: Subsequent Annual Wellness Visit Persons participating in visit and providing information:: patient Medicare Wellness Visit Mode:: Telephone If telephone:: video declined Since this visit was completed virtually, some vitals may be partially provided or unavailable. Missing vitals are due to the limitations of the virtual format.: Documented vitals are patient reported If Telephone or Video please confirm:: I connected with patient using audio/video enable telemedicine. I verified patient identity with two identifiers, discussed telehealth limitations, and patient agreed to proceed. Patient Location:: HOME Provider Location:: HOME Interpreter Needed?: No Pre-visit prep was completed: yes AWV questionnaire completed by patient prior to visit?: no Living arrangements:: (!) lives alone Patient's Overall Health Status Rating: good Typical amount of pain: none Does pain affect daily life?: no Are you currently prescribed opioids?: no  Dietary Habits and Nutritional Risks How many meals a day?: 3 Eats fruit and vegetables daily?: yes Most meals are obtained by: preparing own meals In the last 2 weeks, have you had any of the following?: none Diabetic:: (!) yes Any non-healing wounds?: no How often do you check your BS?: 1 Would you like to be referred to a Nutritionist or for Diabetic Management? : no  Functional Status Activities of Daily Living (to include ambulation/medication): Independent Ambulation: Independent with device- listed below Home Assistive Devices/Equipment: Johna Finder (specify Type); Eyeglasses; Elevated toliet seat; Shower/tub chair; Dentures (specify type) Medication Administration: Independent Home Management (perform  basic housework or laundry): Independent Manage your own finances?: yes Primary transportation is: family / friends (Son) Concerns about vision?: no *vision screening is required for WTM* Concerns about hearing?: no  Fall Screening Falls in the past year?: 0 Number of falls in past year: 0 Was there an injury with Fall?: 0 Fall Risk Category Calculator: 0 Patient Fall Risk Level: Low Fall Risk  Fall Risk Patient at Risk for Falls Due to: Impaired balance/gait Fall risk Follow up: Falls evaluation completed; Education provided  Home and Transportation Safety: All rugs have non-skid backing?: N/A, no rugs All stairs or steps have railings?: N/A, no stairs Grab bars in the bathtub or shower?: yes Have non-skid surface in bathtub or shower?: yes Good home lighting?: yes Regular seat belt use?: yes Hospital stays in the last year:: no  Cognitive Assessment Difficulty concentrating, remembering, or making decisions? : no Will 6CIT or Mini Cog be Completed: no 6CIT or Mini Cog Declined: patient alert, oriented, able to answer questions appropriately and recall recent events Was the patient able to repeat memory words in 3 tries?: yes Which version was used?: Version1 : banana, sunrise, chair Clock numbers correct?: yes Clock time correct (11:10)?: yes Normal clock drawing test?: 2 How many words correct?: 3 Which version was used?: Version 1: banana, sunrise, chair Mini-Cog Scoring: 5  Advance Directives (For Healthcare) Does Patient Have a Medical Advance Directive?: Yes Does patient want to make changes to medical advance directive?: No - Patient declined Type of Advance Directive: Healthcare Power of [place]; Living will Copy of Healthcare Power of Attorney in Chart?: Yes - validated most recent copy scanned in chart (See row information) Copy of Living Will in Chart?: Yes - validated most recent copy scanned in chart (See row information) Would patient like information on  creating  a medical advance directive?: No - Patient declined  Reviewed/Updated  Reviewed/Updated: Reviewed All (Medical, Surgical, Family, Medications, Allergies, Care Teams, Patient Goals)    Allergies (verified) Patient has no known allergies.   Current Medications (verified) Outpatient Encounter Medications as of 05/03/2024  Medication Sig   acetaminophen  (TYLENOL ) 325 MG tablet Take 650 mg by mouth every 4 (four) hours as needed for mild pain (pain score 1-3).   albuterol  (VENTOLIN  HFA) 108 (90 Base) MCG/ACT inhaler INHALE 2 PUFFS INTO THE LUNGS EVERY 6 HOURS AS NEEDED FOR WHEEZING OR SHORTNESS OF BREATH   alendronate  (FOSAMAX ) 70 MG tablet TAKE 1 TABLET BY MOUTH EVERY 7 DAYS WITH A FULL GLASS OF WATER AND ON AN EMPTY STOMACH   amLODipine  (NORVASC ) 10 MG tablet Take 1 tablet (10 mg total) by mouth daily.   aspirin  EC 81 MG tablet Take 1 tablet (81 mg total) by mouth daily. Swallow whole.   atorvastatin  (LIPITOR) 20 MG tablet TAKE 1 TABLET(20 MG) BY MOUTH DAILY AT 6 PM   Blood Glucose Monitoring Suppl (CONTOUR PLUS BLUE) w/Device KIT 1 kit by Does not apply route daily. Use daily to check CBG   BREO ELLIPTA  100-25 MCG/ACT AEPB INHALE 1 PUFF INTO THE LUNGS DAILY   calcium -vitamin D  (OSCAL WITH D) 500-200 MG-UNIT tablet Take 1 tablet by mouth 2 (two) times daily.   furosemide  (LASIX ) 20 MG tablet TAKE 1 TABLET(20 MG) BY MOUTH DAILY   glucose blood test strip Use to check CBG daily   ibuprofen  (ADVIL ) 400 MG tablet Take 1 tablet (400 mg total) by mouth every 12 (twelve) hours as needed. (Patient not taking: Reported on 03/27/2024)   lisinopril  (ZESTRIL ) 20 MG tablet Take 1 tablet (20 mg total) by mouth daily.   metFORMIN  (GLUCOPHAGE -XR) 500 MG 24 hr tablet TAKE 1 TABLET(500 MG) BY MOUTH EVERY OTHER DAY   omeprazole  (PRILOSEC) 20 MG capsule TAKE 1 CAPSULE BY MOUTH EVERY DAY AS NEEDED. PROLONGED USE MAY CAUSE RENAL IMPAIRMENT   No facility-administered encounter medications on file as of  05/03/2024.    History: Past Medical History:  Diagnosis Date   Alternating exotropia    Arterial insufficiency 02/04/2004   ABI 0.84   CAP (community acquired pneumonia) 10/2015   Cataract    Chest pain 09/11/2015   COPD (chronic obstructive pulmonary disease) (HCC)    Degenerative disk disease 10/2005   Lumbar, anterolithesis, L4-5,5-S1   DEVELOPMENTAL READING DISORDER UNSPECIFIED 07/18/2007   Qualifier: Diagnosis of  By: Levonne MD, Wayne     Diabetes mellitus without complication (HCC)    Estrogen deficiency 09/22/2015   GERD (gastroesophageal reflux disease)    Headache 03/11/2016   Hilar mass    CT Chest 10/2015 IMPRESSION: No demonstrable pulmonary embolus.  There is infiltrate consistent with pneumonia in portions of the right lower lobe. There is patchy atelectasis in the left lower lobe. There is no appreciable adenopathy. The prominence in the right hilum noted previously is apparently due to vascular prominence as opposed to mass or adenopathy. 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   Noncompliance with medications 09/11/2015   Osteoporosis    TOBACCO USER 03/04/2009   Snuff dipper     Past Surgical History:  Procedure Laterality Date   CESAREAN SECTION  1975   CHOLECYSTECTOMY  1975   states gallstones removed   TUBAL LIGATION  1975   Family History  Problem Relation Age of Onset  Diabetes Father    Hypertension Mother    Diabetes Mother    Lung cancer Mother    Hypertension Sister    Asthma Son    Colon polyps Neg Hx    Esophageal cancer Neg Hx    Stomach cancer Neg Hx    Rectal cancer Neg Hx    Social History   Occupational History   Not on file  Tobacco Use   Smoking status: Former    Current packs/day: 0.00    Average packs/day: 2.0 packs/day for 27.0 years (54.0 ttl pk-yrs)    Types: Cigarettes    Start date: 09/24/1961    Quit date: 09/24/1988    Years since quitting: 35.6     Passive exposure: Past   Smokeless tobacco: Former    Types: Snuff    Quit date: 07/05/2018  Substance and Sexual Activity   Alcohol use: Yes    Alcohol/week: 2.0 standard drinks of alcohol    Types: 2 Standard drinks or equivalent per week   Drug use: No   Sexual activity: Not Currently    Birth control/protection: Surgical   Tobacco Counseling Counseling given: Not Answered  SDOH Screenings   Food Insecurity: No Food Insecurity (05/03/2024)  Housing: Low Risk (05/03/2024)  Transportation Needs: No Transportation Needs (05/03/2024)  Utilities: Not At Risk (05/03/2024)  Alcohol Screen: Low Risk (05/03/2024)  Depression (PHQ2-9): Low Risk (05/03/2024)  Financial Resource Strain: Low Risk (05/03/2024)  Physical Activity: Sufficiently Active (05/03/2024)  Recent Concern: Physical Activity - Insufficiently Active (05/03/2024)  Social Connections: Unknown (05/03/2024)  Stress: No Stress Concern Present (05/03/2024)  Tobacco Use: Medium Risk (05/03/2024)  Health Literacy: Adequate Health Literacy (05/03/2024)   See flowsheets for full screening details  Depression Screen PHQ 2 & 9 Depression Scale- Over the past 2 weeks, how often have you been bothered by any of the following problems? Little interest or pleasure in doing things: 0 Feeling down, depressed, or hopeless (PHQ Adolescent also includes...irritable): 0 PHQ-2 Total Score: 0 Trouble falling or staying asleep, or sleeping too much: 0 Feeling tired or having little energy: 0 Poor appetite or overeating (PHQ Adolescent also includes...weight loss): 0 Feeling bad about yourself - or that you are a failure or have let yourself or your family down: 0 Trouble concentrating on things, such as reading the newspaper or watching television (PHQ Adolescent also includes...like school work): 0 Moving or speaking so slowly that other people could have noticed. Or the opposite - being so fidgety or restless that you have been moving around a lot  more than usual: 0 Thoughts that you would be better off dead, or of hurting yourself in some way: 0 PHQ-9 Total Score: 0 If you checked off any problems, how difficult have these problems made it for you to do your work, take care of things at home, or get along with other people?: Not difficult at all  Depression Treatment Depression Interventions/Treatment : EYV7-0 Score <4 Follow-up Not Indicated     Goals Addressed             This Visit's Progress    05/03/2024: To stay healthy and maintain independent.               Objective:    Today's Vitals   05/03/24 0913  Weight: 200 lb (90.7 kg)  Height: 4' 11 (1.499 m)  PainSc: 0-No pain   Body mass index is 40.4 kg/m.  Hearing/Vision screen No results found. Immunizations and Health Maintenance Health Maintenance  Topic Date Due   HEMOGLOBIN A1C  11/10/2023   Influenza Vaccine  11/11/2023   COVID-19 Vaccine (8 - 2025-26 season) 12/12/2023   Diabetic kidney evaluation - Urine ACR  05/12/2024   Diabetic kidney evaluation - eGFR measurement  06/16/2024   FOOT EXAM  08/02/2024   OPHTHALMOLOGY EXAM  12/27/2024   Medicare Annual Wellness (AWV)  05/03/2025   DTaP/Tdap/Td (3 - Td or Tdap) 01/29/2026   Pneumococcal Vaccine: 50+ Years  Completed   Bone Density Scan  Completed   Hepatitis C Screening  Completed   Zoster Vaccines- Shingrix  Completed   Meningococcal B Vaccine  Aged Out   Mammogram  Discontinued   Colonoscopy  Discontinued        Assessment/Plan:  This is a routine wellness examination for Paxtyn.  Patient Care Team: Anders Otto DASEN, MD as PCP - General (Family Medicine) Alvan Ronal BRAVO, MD (Inactive) as PCP - Cardiology (Cardiology) Jorja Nichole LABOR, RN as [facility] Care Management  I have personally reviewed and noted the following in the patients chart:   Medical and social history Use of alcohol, tobacco or illicit drugs  Current medications and supplements including opioid  prescriptions. Functional ability and status Nutritional status Physical activity Advanced directives List of other physicians Hospitalizations, surgeries, and ER visits in previous 12 months Vitals Screenings to include cognitive, depression, and falls Referrals and appointments  No orders of the defined types were placed in this encounter.  In addition, I have reviewed and discussed with patient certain preventive protocols, quality metrics, and best practice recommendations. A written personalized care plan for preventive services as well as general preventive health recommendations were provided to patient.   Roz LOISE Fuller, LPN   [id number]/[id number]   Return in about 1 year (around 05/03/2025) for Medicare wellness.  After Visit Summary: (Declined) Due to this being a telephonic visit, with patients personalized plan was offered to patient but patient Declined AVS at this time   Nurse Notes:  Separate telephone note sent for (Patient requesting refills on medications.) HM Addressed: Vaccines Due: Flu and Covid-19 Labs Due HgA1C, Diabetic Kidney Evaluation Separate telephone encounter sent to schedule patient an appointment to see PCP.  "

## 2024-05-03 NOTE — Telephone Encounter (Signed)
 Patient stated during her AWV that she would like to speak with nurse to send in refills.  Also this patient is overdue for her appointment with Dr. Anders.  Patient has to depend on her son for transportation to get to her appointmnets.  Please call patient to address these issues.  Desiree Milles N. Tomie, LPN [facility] Annual Wellness Team Direct Dial: [phone]

## 2024-05-03 NOTE — Progress Notes (Signed)
 Attempted to reach patients son to see if he would be able to bring his mother to the Fri Jan 30th appt at 8:10. I asked patients son to call the office if he would be able to do that date or to fine a day and time that would work for him. Nelson Land, CMA

## 2024-05-03 NOTE — Patient Instructions (Signed)
 Desiree Parker,  Thank you for taking the time for your Medicare Wellness Visit. I appreciate your continued commitment to your health goals. Please review the care plan we discussed, and feel free to reach out if I can assist you further.  Please note that Annual Wellness Visits do not include a physical exam. Some assessments may be limited, especially if the visit was conducted virtually. If needed, we may recommend an in-person follow-up with your provider.  Ongoing Care Seeing your primary care provider every 3 to 6 months helps us  monitor your health and provide consistent, personalized care.   Referrals If a referral was made during today's visit and you haven't received any updates within two weeks, please contact the referred provider directly to check on the status.  Recommended Screenings:  Health Maintenance  Topic Date Due   Hemoglobin A1C  11/10/2023   Flu Shot  11/11/2023   COVID-19 Vaccine (8 - 2025-26 season) 12/12/2023   Kidney health urinalysis for diabetes  05/12/2024   Medicare Annual Wellness Visit  05/12/2024   Yearly kidney function blood test for diabetes  06/16/2024   Complete foot exam   08/02/2024   Eye exam for diabetics  12/27/2024   DTaP/Tdap/Td vaccine (3 - Td or Tdap) 01/29/2026   Pneumococcal Vaccine for age over 58  Completed   Osteoporosis screening with Bone Density Scan  Completed   Hepatitis C Screening  Completed   Zoster (Shingles) Vaccine  Completed   Meningitis B Vaccine  Aged Out   Breast Cancer Screening  Discontinued   Colon Cancer Screening  Discontinued       05/03/2024    9:17 AM  Advanced Directives  Does Patient Have a Medical Advance Directive? Yes  Type of Estate Agent of [place];Living will  Does patient want to make changes to medical advance directive? No - Patient declined  Copy of Healthcare Power of Attorney in Chart? Yes - validated most recent copy scanned in chart (See row information)    Vision:  Annual vision screenings are recommended for early detection of glaucoma, cataracts, and diabetic retinopathy. These exams can also reveal signs of chronic conditions such as diabetes and high blood pressure.  Dental: Annual dental screenings help detect early signs of oral cancer, gum disease, and other conditions linked to overall health, including heart disease and diabetes.  Please see the attached documents for additional preventive care recommendations.

## 2024-05-07 ENCOUNTER — Telehealth: Payer: Self-pay | Admitting: Pharmacist

## 2024-05-07 DIAGNOSIS — J4489 Other specified chronic obstructive pulmonary disease: Secondary | ICD-10-CM

## 2024-05-07 DIAGNOSIS — M810 Age-related osteoporosis without current pathological fracture: Secondary | ICD-10-CM

## 2024-05-07 DIAGNOSIS — K219 Gastro-esophageal reflux disease without esophagitis: Secondary | ICD-10-CM

## 2024-05-07 MED ORDER — OMEPRAZOLE 20 MG PO CPDR: DELAYED_RELEASE_CAPSULE | ORAL | 3 refills | Status: AC

## 2024-05-07 MED ORDER — ALENDRONATE SODIUM 70 MG PO TABS: ORAL_TABLET | ORAL | 3 refills | Status: AC

## 2024-05-07 MED ORDER — LISINOPRIL 20 MG PO TABS: 20.0000 mg | ORAL_TABLET | Freq: Every day | ORAL | 3 refills | Status: AC

## 2024-05-07 MED ORDER — METFORMIN HCL ER 500 MG PO TB24: 500.0000 mg | ORAL_TABLET | ORAL | 3 refills | Status: AC

## 2024-05-07 MED ORDER — FLUTICASONE FUROATE-VILANTEROL 100-25 MCG/ACT IN AEPB: 1.0000 | INHALATION_SPRAY | Freq: Every day | RESPIRATORY_TRACT | 6 refills | Status: AC

## 2024-05-07 MED ORDER — AMLODIPINE BESYLATE 10 MG PO TABS: 10.0000 mg | ORAL_TABLET | Freq: Every day | ORAL | 3 refills | Status: AC

## 2024-05-07 MED ORDER — FUROSEMIDE 20 MG PO TABS: 20.0000 mg | ORAL_TABLET | Freq: Every day | ORAL | 30 refills | Status: AC

## 2024-05-07 MED ORDER — ALBUTEROL SULFATE HFA 108 (90 BASE) MCG/ACT IN AERS: 2.0000 | INHALATION_SPRAY | Freq: Four times a day (QID) | RESPIRATORY_TRACT | 2 refills | Status: AC | PRN

## 2024-05-07 NOTE — Telephone Encounter (Signed)
 Attempted to contact patient for follow-up of quality measure and request to reschedule PCP appointment, refills on all medications.    This patient is appearing on a report for being at risk of failing the adherence measure for hypertension (ACEi/ARB) medications this calendar year.   Medication: lisinopril  Last fill date: 09/2023 for 90 day supply  Reviewed medication indication, dosing, and goals of therapy.      Patient requested refills on all medications for a year - she could not verify medications individually, stating she was not at home now.   I agreed to refill all current maintenance medication to her verified pharmacy.   8 refills provided.   She also asked that I contact her son to schedule her an appointment.    I attempted contact of the son, left VM requesting he call to schedule and provided clinic call back number.     Total time with patient call and documentation of interaction: 22 minutes.  Follow-up phone call planned: None

## 2024-05-08 NOTE — Telephone Encounter (Signed)
 Reviewed and agree with Dr Rennis plan.

## 2024-05-10 ENCOUNTER — Other Ambulatory Visit: Payer: Self-pay

## 2024-05-10 ENCOUNTER — Other Ambulatory Visit: Payer: Self-pay | Admitting: *Deleted

## 2024-05-11 NOTE — Patient Outreach (Signed)
 Complex Care Management   Visit Note  05/11/2024  Name:  Desiree Parker MRN: [id number] DOB: May 29, 1945  Situation: Referral received for Complex Care Management related to COPD and HTN I obtained verbal consent from Patient.  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  There is infiltrate consistent with pneumonia in portions of the right lower lobe. There is patchy atelectasis in the left lower lobe. There is no appreciable adenopathy. The prominence in the right hilum noted previously is apparently due to vascular prominence as opposed to mass or adenopathy. 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   Noncompliance with medications 09/11/2015   Osteoporosis    TOBACCO USER 03/04/2009   Snuff dipper      Assessment: Patient Reported Symptoms:  Cognitive Cognitive Status: Able to follow simple commands, Alert and oriented to person, place, and time, Insightful and able to interpret abstract concepts, Normal speech and language skills Cognitive/Intellectual Conditions Management [RPT]: None reported or documented in medical history or problem list   Health Maintenance Behaviors: Annual physical exam, Sleep adequate, Healthy diet, Stress management, Spiritual  practice(s) Healing Pattern: Average Health Facilitated by: Prayer/meditation, Stress management, Rest, Healthy diet  Neurological Neurological Review of Symptoms: No symptoms reported Neurological Management Strategies: Adequate rest, Counseling Neurological Self-Management Outcome: 4 (good)  HEENT HEENT Symptoms Reported: No symptoms reported HEENT Management Strategies: Adequate rest, Routine screening HEENT Self-Management Outcome: 4 (good)    Cardiovascular Cardiovascular Symptoms Reported: No symptoms reported Does patient have uncontrolled Hypertension?: No Cardiovascular Management Strategies: Adequate rest, Routine screening Cardiovascular Self-Management Outcome: 4 (good) Cardiovascular Comment: Patient reports no blood pressure readings.  Encouraged patient to check BP weekly at local [employer] store and record readings.  Respiratory Respiratory Symptoms Reported: Wheezing, Shortness of breath Other Respiratory Symptoms: Reports that she has had occassional shortness of breath and wheezing.  Patient reports that she uses her Albuterol  inhaler as needed. Respiratory Management Strategies: Adequate rest, Routine screening Respiratory Self-Management Outcome: 4 (good)  Endocrine Endocrine Symptoms Reported: No symptoms reported Is patient diabetic?: Yes Is patient checking blood sugars at home?:  (Patient reports that she checks her blood sugar when she thinks about checking her blood sugar.) List most recent blood sugar readings, include date and time of day: I have encouraged patient to check blood sugar one daily and record readings. Endocrine Self-Management Outcome: 3 (uncertain) Endocrine Comment: Per Medical record Last AIC was 7.1  Gastrointestinal Gastrointestinal Symptoms Reported: No symptoms reported Gastrointestinal Management Strategies: Adequate rest, Activity Gastrointestinal Self-Management Outcome: 4 (good)    Genitourinary Genitourinary Symptoms Reported: No  symptoms reported Genitourinary Management Strategies: Adequate rest Genitourinary Self-Management Outcome: 4 (good)  Integumentary Integumentary Symptoms Reported: No symptoms reported Skin Management Strategies: Adequate rest, Routine screening Skin Self-Management  Outcome: 4 (good)  Musculoskeletal Musculoskelatal Symptoms Reviewed: Limited mobility Additional Musculoskeletal Details: Patient reports that she uses a walker to get around with.  She informs me that she also uses a cane to get around in church. Musculoskeletal Management Strategies: Adequate rest, Activity, Coping strategies, Medical device Musculoskeletal Self-Management Outcome: 3 (uncertain) Falls in the past year?: No Number of falls in past year: 1 or less Was there an injury with Fall?: No Fall Risk Category Calculator: 0 Patient Fall Risk Level: Low Fall Risk Patient at Risk for Falls Due to: Impaired balance/gait Fall risk Follow up: Falls evaluation completed  Psychosocial Psychosocial Symptoms Reported: No symptoms reported Behavioral Management Strategies: Support system, Activity Behavioral Health Self-Management Outcome: 4 (good) Major Change/Loss/Stressor/Fears (CP): Denies Techniques to Cope with Loss/Stress/Change: Spiritual practice(s) Quality of Family Relationships: helpful, involved, supportive Do you feel physically threatened by others?: No    05/11/2024    PHQ2-9 Depression Screening   Little interest or pleasure in doing things Not at all  Feeling down, depressed, or hopeless Not at all  PHQ-2 - Total Score 0  Trouble falling or staying asleep, or sleeping too much    Feeling tired or having little energy    Poor appetite or overeating     Feeling bad about yourself - or that you are a failure or have let yourself or your family down    Trouble concentrating on things, such as reading the newspaper or watching television    Moving or speaking so slowly that other people could have noticed.   Or the opposite - being so fidgety or restless that you have been moving around a lot more than usual    Thoughts that you would be better off dead, or hurting yourself in some way    PHQ2-9 Total Score    If you checked off any problems, how difficult have these problems made it for you to do your work, take care of things at home, or get along with other people    Depression Interventions/Treatment      There were no vitals filed for this visit. Pain Scale: 0-10 Pain Score: 0-No pain  Medications Reviewed Today     Reviewed by Jorja Nichole LABOR, RN (Case Manager) on 05/10/24 at 1543  Med List Status: <None>   Medication Order Taking? Sig Documenting Provider Last Dose Status Informant  acetaminophen  (TYLENOL ) 325 MG tablet [id number] Yes Take 650 mg by mouth every 4 (four) hours as needed for mild pain (pain score 1-3). [provider]  Active   albuterol  (VENTOLIN  HFA) 108 (90 Base) MCG/ACT inhaler [id number] Yes Inhale 2 puffs into the lungs every 6 (six) hours as needed for wheezing or shortness of breath. Rumball, Alison M, DO  Active   alendronate  (FOSAMAX ) 70 MG tablet [id number] Yes TAKE 1 TABLET BY MOUTH EVERY 7 DAYS WITH A FULL GLASS OF WATER AND ON AN EMPTY STOMACH Madelon Donald HERO, DO  Active   amLODipine  (NORVASC ) 10 MG tablet [id number] Yes Take 1 tablet (10 mg total) by mouth daily. Rumball, Alison M, DO  Active   aspirin  EC 81 MG tablet [id number] Yes Take 1 tablet (81 mg total) by mouth daily. Swallow whole. Anders Otto DASEN, MD  Active   atorvastatin  (LIPITOR) 20 MG tablet [id number] Yes TAKE 1 TABLET(20 MG) BY MOUTH DAILY AT 6 PM Eniola, Kehinde T, MD  Active   Blood Glucose Monitoring Suppl (CONTOUR PLUS BLUE) w/Device KIT [id number] Yes 1 kit by Does not apply  route daily. Use daily to check CBG Eniola, Otto DASEN, MD  Active   calcium -vitamin D  (OSCAL WITH D) 500-200 MG-UNIT tablet [id number] Yes Take 1 tablet by mouth 2 (two) times daily. Anders Otto DASEN, MD  Active    fluticasone  furoate-vilanterol (BREO ELLIPTA ) 100-25 MCG/ACT AEPB [id number] Yes Inhale 1 puff into the lungs daily. Rumball, Alison M, DO  Active   furosemide  (LASIX ) 20 MG tablet [id number] Yes Take 1 tablet (20 mg total) by mouth daily. Rumball, Alison M, DO  Active   glucose blood test strip [id number] Yes Use to check CBG daily Anders Otto T, MD  Active   ibuprofen  (ADVIL ) 400 MG tablet [id number]  Take 1 tablet (400 mg total) by mouth every 12 (twelve) hours as needed.  Patient not taking: Reported on 05/10/2024   Anders Otto DASEN, MD  Active   lisinopril  (ZESTRIL ) 20 MG tablet [id number] Yes Take 1 tablet (20 mg total) by mouth daily. Rumball, Alison M, DO  Active   metFORMIN  (GLUCOPHAGE -XR) 500 MG 24 hr tablet [id number] Yes Take 1 tablet (500 mg total) by mouth every other day. Rumball, Alison M, DO  Active   omeprazole  (PRILOSEC) 20 MG capsule [id number] Yes TAKE 1 CAPSULE BY MOUTH EVERY DAY AS NEEDED. PROLONGED USE MAY CAUSE RENAL IMPAIRMENT Madelon Donald HERO, DO  Active             Recommendation:   PCP Follow-up Specialty provider follow-up : Podiatry- 06/12/24 Continue Current Plan of Care  Follow Up Plan:   Telephone follow-up in 1 month:06/28/24 @ 11 am.  Alayssa Flinchum, RN, BSN, ACM RN Care Manager [employer] [phone]

## 2024-06-12 ENCOUNTER — Ambulatory Visit: Admitting: Podiatry

## 2024-06-28 ENCOUNTER — Telehealth: Admitting: *Deleted
